# Patient Record
Sex: Male | Born: 1935 | Race: White | Hispanic: No | Smoking: Never smoker
Health system: Southern US, Community
[De-identification: ages and names within clinical notes are randomized; demographics above are authoritative.]

## PROBLEM LIST (undated history)

## (undated) DIAGNOSIS — C61 Malignant neoplasm of prostate: Secondary | ICD-10-CM

## (undated) DIAGNOSIS — Z860100 Personal history of colon polyps, unspecified: Secondary | ICD-10-CM

## (undated) DIAGNOSIS — N1330 Unspecified hydronephrosis: Secondary | ICD-10-CM

## (undated) DIAGNOSIS — C7951 Secondary malignant neoplasm of bone: Secondary | ICD-10-CM

## (undated) DIAGNOSIS — R55 Syncope and collapse: Secondary | ICD-10-CM

## (undated) DIAGNOSIS — Z192 Hormone resistant malignancy status: Secondary | ICD-10-CM

## (undated) DIAGNOSIS — K219 Gastro-esophageal reflux disease without esophagitis: Secondary | ICD-10-CM

## (undated) DIAGNOSIS — Z87898 Personal history of other specified conditions: Secondary | ICD-10-CM

## (undated) DIAGNOSIS — Z8719 Personal history of other diseases of the digestive system: Secondary | ICD-10-CM

## (undated) DIAGNOSIS — I440 Atrioventricular block, first degree: Secondary | ICD-10-CM

## (undated) DIAGNOSIS — K222 Esophageal obstruction: Secondary | ICD-10-CM

## (undated) DIAGNOSIS — Z923 Personal history of irradiation: Secondary | ICD-10-CM

## (undated) DIAGNOSIS — N529 Male erectile dysfunction, unspecified: Secondary | ICD-10-CM

## (undated) DIAGNOSIS — Z972 Presence of dental prosthetic device (complete) (partial): Secondary | ICD-10-CM

## (undated) DIAGNOSIS — K449 Diaphragmatic hernia without obstruction or gangrene: Secondary | ICD-10-CM

## (undated) DIAGNOSIS — R32 Unspecified urinary incontinence: Secondary | ICD-10-CM

## (undated) DIAGNOSIS — G4700 Insomnia, unspecified: Secondary | ICD-10-CM

## (undated) DIAGNOSIS — D649 Anemia, unspecified: Secondary | ICD-10-CM

## (undated) DIAGNOSIS — F419 Anxiety disorder, unspecified: Secondary | ICD-10-CM

## (undated) DIAGNOSIS — Z9079 Acquired absence of other genital organ(s): Secondary | ICD-10-CM

## (undated) DIAGNOSIS — I10 Essential (primary) hypertension: Secondary | ICD-10-CM

## (undated) HISTORY — DX: Essential (primary) hypertension: I10

## (undated) HISTORY — DX: Personal history of colonic polyps: Z86.010

## (undated) HISTORY — DX: Diaphragmatic hernia without obstruction or gangrene: K44.9

## (undated) HISTORY — PX: SUPRAPUBIC PROSTATECTOMY: SUR1056

## (undated) HISTORY — DX: Syncope and collapse: R55

## (undated) HISTORY — PX: APPENDECTOMY: SHX54

## (undated) HISTORY — DX: Male erectile dysfunction, unspecified: N52.9

## (undated) HISTORY — DX: Personal history of colon polyps, unspecified: Z86.0100

## (undated) HISTORY — DX: Malignant neoplasm of prostate: C61

## (undated) HISTORY — DX: Anxiety disorder, unspecified: F41.9

## (undated) HISTORY — PX: CATARACT EXTRACTION W/ INTRAOCULAR LENS  IMPLANT, BILATERAL: SHX1307

## (undated) HISTORY — DX: Insomnia, unspecified: G47.00

## (undated) HISTORY — PX: TRANSURETHRAL RESECTION OF PROSTATE: SHX73

## (undated) HISTORY — DX: Gastro-esophageal reflux disease without esophagitis: K21.9

---
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
## 1994-05-03 ENCOUNTER — Encounter (INDEPENDENT_AMBULATORY_CARE_PROVIDER_SITE_OTHER): Payer: Self-pay | Admitting: *Deleted

## 1997-09-11 ENCOUNTER — Ambulatory Visit (HOSPITAL_COMMUNITY): Admission: RE | Admit: 1997-09-11 | Discharge: 1997-09-11 | Payer: Self-pay | Admitting: Internal Medicine

## 1997-11-19 ENCOUNTER — Ambulatory Visit (HOSPITAL_COMMUNITY): Admission: RE | Admit: 1997-11-19 | Discharge: 1997-11-19 | Payer: Self-pay | Admitting: Internal Medicine

## 1997-12-09 ENCOUNTER — Ambulatory Visit (HOSPITAL_COMMUNITY): Admission: RE | Admit: 1997-12-09 | Discharge: 1997-12-09 | Payer: Self-pay | Admitting: Surgery

## 2002-04-21 ENCOUNTER — Encounter: Payer: Self-pay | Admitting: *Deleted

## 2002-04-21 ENCOUNTER — Inpatient Hospital Stay (HOSPITAL_COMMUNITY): Admission: EM | Admit: 2002-04-21 | Discharge: 2002-04-22 | Payer: Self-pay | Admitting: Emergency Medicine

## 2003-05-28 ENCOUNTER — Encounter: Payer: Self-pay | Admitting: Internal Medicine

## 2004-06-17 ENCOUNTER — Ambulatory Visit: Payer: Self-pay | Admitting: Internal Medicine

## 2004-09-06 ENCOUNTER — Ambulatory Visit: Payer: Self-pay | Admitting: Internal Medicine

## 2005-01-20 ENCOUNTER — Ambulatory Visit: Payer: Self-pay | Admitting: Internal Medicine

## 2005-04-21 ENCOUNTER — Ambulatory Visit: Payer: Self-pay | Admitting: Internal Medicine

## 2005-05-02 ENCOUNTER — Ambulatory Visit: Payer: Self-pay | Admitting: Internal Medicine

## 2005-07-04 ENCOUNTER — Ambulatory Visit: Payer: Self-pay | Admitting: Internal Medicine

## 2005-07-05 ENCOUNTER — Ambulatory Visit (HOSPITAL_COMMUNITY): Admission: RE | Admit: 2005-07-05 | Discharge: 2005-07-05 | Payer: Self-pay | Admitting: Surgery

## 2005-07-18 ENCOUNTER — Ambulatory Visit: Payer: Self-pay | Admitting: Internal Medicine

## 2006-01-11 ENCOUNTER — Ambulatory Visit: Payer: Self-pay | Admitting: Internal Medicine

## 2006-02-22 ENCOUNTER — Ambulatory Visit: Payer: Self-pay | Admitting: Internal Medicine

## 2006-04-04 DIAGNOSIS — Z8711 Personal history of peptic ulcer disease: Secondary | ICD-10-CM

## 2006-04-04 HISTORY — PX: PROSTATECTOMY: SHX69

## 2006-04-04 HISTORY — DX: Personal history of peptic ulcer disease: Z87.11

## 2006-07-25 ENCOUNTER — Ambulatory Visit: Payer: Self-pay | Admitting: Internal Medicine

## 2006-09-09 ENCOUNTER — Emergency Department (HOSPITAL_COMMUNITY): Admission: EM | Admit: 2006-09-09 | Discharge: 2006-09-09 | Payer: Self-pay | Admitting: Emergency Medicine

## 2006-09-18 ENCOUNTER — Encounter: Payer: Self-pay | Admitting: Urology

## 2006-09-19 ENCOUNTER — Inpatient Hospital Stay (HOSPITAL_COMMUNITY): Admission: RE | Admit: 2006-09-19 | Discharge: 2006-09-28 | Payer: Self-pay | Admitting: Urology

## 2006-09-19 ENCOUNTER — Ambulatory Visit: Payer: Self-pay | Admitting: Emergency Medicine

## 2006-09-27 ENCOUNTER — Ambulatory Visit: Payer: Self-pay | Admitting: Internal Medicine

## 2006-10-12 ENCOUNTER — Inpatient Hospital Stay (HOSPITAL_COMMUNITY): Admission: AD | Admit: 2006-10-12 | Discharge: 2006-10-16 | Payer: Self-pay | Admitting: Gastroenterology

## 2006-10-27 ENCOUNTER — Ambulatory Visit (HOSPITAL_COMMUNITY): Admission: RE | Admit: 2006-10-27 | Discharge: 2006-10-27 | Payer: Self-pay | Admitting: Surgery

## 2007-01-10 ENCOUNTER — Ambulatory Visit: Payer: Self-pay | Admitting: Internal Medicine

## 2007-01-10 LAB — CONVERTED CEMR LAB
ALT: 16 units/L (ref 0–53)
AST: 20 units/L (ref 0–37)
Albumin: 3.7 g/dL (ref 3.5–5.2)
Basophils Absolute: 0 10*3/uL (ref 0.0–0.1)
Basophils Relative: 0.4 % (ref 0.0–1.0)
Bilirubin, Direct: 0.1 mg/dL (ref 0.0–0.3)
CO2: 27 meq/L (ref 19–32)
Chloride: 110 meq/L (ref 96–112)
Eosinophils Absolute: 0.1 10*3/uL (ref 0.0–0.6)
GFR calc non Af Amer: 77 mL/min
HCT: 40.9 % (ref 39.0–52.0)
HDL: 47.7 mg/dL (ref >39.0)
MCHC: 32.8 g/dL (ref 30.0–36.0)
MCV: 80 fL (ref 78.0–100.0)
Neutro Abs: 3.8 10*3/uL (ref 1.4–7.7)
Neutrophils Relative %: 64.7 % (ref 43.0–77.0)
RBC: 5.11 M/uL (ref 4.22–5.81)
TSH: 1.62 microintl units/mL (ref 0.35–5.50)
Total Bilirubin: 0.7 mg/dL (ref 0.3–1.2)
Total CHOL/HDL Ratio: 3.5
Triglycerides: 89 mg/dL (ref 0–149)
VLDL: 18 mg/dL (ref 0–40)
WBC: 5.8 10*3/uL (ref 4.5–10.5)

## 2007-03-21 ENCOUNTER — Encounter: Payer: Self-pay | Admitting: Internal Medicine

## 2007-04-19 ENCOUNTER — Ambulatory Visit: Payer: Self-pay | Admitting: Internal Medicine

## 2007-04-19 DIAGNOSIS — I10 Essential (primary) hypertension: Secondary | ICD-10-CM | POA: Insufficient documentation

## 2007-04-19 DIAGNOSIS — Z860100 Personal history of colon polyps, unspecified: Secondary | ICD-10-CM | POA: Insufficient documentation

## 2007-04-19 DIAGNOSIS — F411 Generalized anxiety disorder: Secondary | ICD-10-CM | POA: Insufficient documentation

## 2007-04-19 DIAGNOSIS — K219 Gastro-esophageal reflux disease without esophagitis: Secondary | ICD-10-CM | POA: Insufficient documentation

## 2007-04-19 DIAGNOSIS — E559 Vitamin D deficiency, unspecified: Secondary | ICD-10-CM | POA: Insufficient documentation

## 2007-04-19 DIAGNOSIS — S139XXA Sprain of joints and ligaments of unspecified parts of neck, initial encounter: Secondary | ICD-10-CM | POA: Insufficient documentation

## 2007-04-19 DIAGNOSIS — K279 Peptic ulcer, site unspecified, unspecified as acute or chronic, without hemorrhage or perforation: Secondary | ICD-10-CM | POA: Insufficient documentation

## 2007-04-26 ENCOUNTER — Telehealth: Payer: Self-pay | Admitting: Internal Medicine

## 2007-08-17 ENCOUNTER — Ambulatory Visit: Payer: Self-pay | Admitting: Internal Medicine

## 2007-08-17 ENCOUNTER — Telehealth: Payer: Self-pay | Admitting: Internal Medicine

## 2007-08-17 DIAGNOSIS — G4700 Insomnia, unspecified: Secondary | ICD-10-CM

## 2007-08-28 ENCOUNTER — Encounter (INDEPENDENT_AMBULATORY_CARE_PROVIDER_SITE_OTHER): Payer: Self-pay | Admitting: *Deleted

## 2007-09-19 ENCOUNTER — Telehealth (INDEPENDENT_AMBULATORY_CARE_PROVIDER_SITE_OTHER): Payer: Self-pay | Admitting: *Deleted

## 2007-10-12 ENCOUNTER — Ambulatory Visit: Payer: Self-pay | Admitting: Internal Medicine

## 2007-11-06 ENCOUNTER — Ambulatory Visit: Payer: Self-pay | Admitting: Gastroenterology

## 2007-11-06 DIAGNOSIS — Z85038 Personal history of other malignant neoplasm of large intestine: Secondary | ICD-10-CM | POA: Insufficient documentation

## 2007-11-07 ENCOUNTER — Telehealth: Payer: Self-pay | Admitting: Gastroenterology

## 2007-12-19 ENCOUNTER — Ambulatory Visit: Payer: Self-pay | Admitting: Gastroenterology

## 2007-12-19 ENCOUNTER — Encounter: Payer: Self-pay | Admitting: Gastroenterology

## 2007-12-21 ENCOUNTER — Encounter: Payer: Self-pay | Admitting: Gastroenterology

## 2008-01-21 ENCOUNTER — Ambulatory Visit: Payer: Self-pay | Admitting: Internal Medicine

## 2008-02-11 ENCOUNTER — Ambulatory Visit: Payer: Self-pay | Admitting: Internal Medicine

## 2008-02-11 LAB — CONVERTED CEMR LAB
Calcium: 9 mg/dL (ref 8.4–10.5)
Chloride: 108 meq/L (ref 96–112)
Creatinine, Ser: 1 mg/dL (ref 0.4–1.5)
GFR calc Af Amer: 93 mL/min
GFR calc non Af Amer: 77 mL/min
Glucose, Bld: 108 mg/dL — ABNORMAL HIGH (ref 70–99)
Potassium: 4.4 meq/L (ref 3.5–5.1)
Sodium: 141 meq/L (ref 135–145)

## 2008-02-18 ENCOUNTER — Ambulatory Visit: Payer: Self-pay | Admitting: Internal Medicine

## 2008-03-11 ENCOUNTER — Ambulatory Visit: Payer: Self-pay | Admitting: Internal Medicine

## 2008-04-14 ENCOUNTER — Telehealth: Payer: Self-pay | Admitting: Internal Medicine

## 2008-04-22 ENCOUNTER — Encounter: Payer: Self-pay | Admitting: Internal Medicine

## 2008-08-18 ENCOUNTER — Ambulatory Visit: Payer: Self-pay | Admitting: Internal Medicine

## 2008-08-18 LAB — CONVERTED CEMR LAB
Calcium: 8.9 mg/dL (ref 8.4–10.5)
Chloride: 107 meq/L (ref 96–112)
Creatinine, Ser: 1 mg/dL (ref 0.4–1.5)
GFR calc non Af Amer: 77.06 mL/min (ref >60)
Potassium: 4.3 meq/L (ref 3.5–5.1)

## 2008-08-22 ENCOUNTER — Ambulatory Visit: Payer: Self-pay | Admitting: Internal Medicine

## 2008-08-22 DIAGNOSIS — R634 Abnormal weight loss: Secondary | ICD-10-CM | POA: Insufficient documentation

## 2008-08-22 DIAGNOSIS — N529 Male erectile dysfunction, unspecified: Secondary | ICD-10-CM | POA: Insufficient documentation

## 2008-08-28 ENCOUNTER — Encounter: Payer: Self-pay | Admitting: Internal Medicine

## 2008-10-02 ENCOUNTER — Ambulatory Visit: Payer: Self-pay | Admitting: Internal Medicine

## 2008-10-02 LAB — CONVERTED CEMR LAB
Basophils Relative: 0.3 % (ref 0.0–3.0)
Eosinophils Absolute: 0 10*3/uL (ref 0.0–0.7)
Eosinophils Relative: 0.7 % (ref 0.0–5.0)
Lymphocytes Relative: 14 % (ref 12.0–46.0)
Lymphs Abs: 0.9 10*3/uL (ref 0.7–4.0)
Monocytes Relative: 9.9 % (ref 3.0–12.0)
Neutrophils Relative %: 75.1 % (ref 43.0–77.0)
Platelets: 169 10*3/uL (ref 150.0–400.0)
RBC: 4.69 M/uL (ref 4.22–5.81)
WBC: 6.2 10*3/uL (ref 4.5–10.5)

## 2008-10-07 ENCOUNTER — Ambulatory Visit: Payer: Self-pay | Admitting: Internal Medicine

## 2008-10-07 DIAGNOSIS — R197 Diarrhea, unspecified: Secondary | ICD-10-CM | POA: Insufficient documentation

## 2009-01-22 ENCOUNTER — Ambulatory Visit (HOSPITAL_COMMUNITY): Admission: RE | Admit: 2009-01-22 | Discharge: 2009-01-22 | Payer: Self-pay | Admitting: Urology

## 2009-01-26 ENCOUNTER — Ambulatory Visit: Payer: Self-pay | Admitting: Internal Medicine

## 2009-02-02 ENCOUNTER — Encounter: Payer: Self-pay | Admitting: Internal Medicine

## 2009-02-04 ENCOUNTER — Ambulatory Visit: Admission: RE | Admit: 2009-02-04 | Discharge: 2009-04-01 | Payer: Self-pay | Admitting: Radiation Oncology

## 2009-02-04 ENCOUNTER — Ambulatory Visit: Payer: Self-pay | Admitting: Internal Medicine

## 2009-02-05 ENCOUNTER — Encounter: Payer: Self-pay | Admitting: Internal Medicine

## 2009-02-05 LAB — CONVERTED CEMR LAB
BUN: 9 mg/dL (ref 6–23)
Basophils Absolute: 0 10*3/uL (ref 0.0–0.1)
Basophils Relative: 0.3 % (ref 0.0–3.0)
Calcium: 9.1 mg/dL (ref 8.4–10.5)
Creatinine, Ser: 1 mg/dL (ref 0.4–1.5)
Lymphocytes Relative: 16.6 % (ref 12.0–46.0)
MCV: 98.5 fL (ref 78.0–100.0)
Monocytes Absolute: 0.6 10*3/uL (ref 0.1–1.0)
Monocytes Relative: 9 % (ref 3.0–12.0)
RBC: 4.72 M/uL (ref 4.22–5.81)
TSH: 1.11 microintl units/mL (ref 0.35–5.50)
WBC: 6.5 10*3/uL (ref 4.5–10.5)

## 2009-02-09 ENCOUNTER — Ambulatory Visit: Payer: Self-pay | Admitting: Internal Medicine

## 2009-04-08 ENCOUNTER — Ambulatory Visit: Admission: RE | Admit: 2009-04-08 | Discharge: 2009-06-24 | Payer: Self-pay | Admitting: Radiation Oncology

## 2009-04-09 ENCOUNTER — Encounter: Payer: Self-pay | Admitting: Internal Medicine

## 2009-04-19 ENCOUNTER — Emergency Department (HOSPITAL_COMMUNITY): Admission: EM | Admit: 2009-04-19 | Discharge: 2009-04-19 | Payer: Self-pay | Admitting: Emergency Medicine

## 2009-04-24 ENCOUNTER — Ambulatory Visit: Payer: Self-pay | Admitting: Internal Medicine

## 2009-04-28 ENCOUNTER — Telehealth (INDEPENDENT_AMBULATORY_CARE_PROVIDER_SITE_OTHER): Payer: Self-pay | Admitting: *Deleted

## 2009-06-25 ENCOUNTER — Encounter: Payer: Self-pay | Admitting: Internal Medicine

## 2009-07-22 ENCOUNTER — Ambulatory Visit: Payer: Self-pay | Admitting: Internal Medicine

## 2009-07-30 ENCOUNTER — Encounter: Payer: Self-pay | Admitting: Internal Medicine

## 2009-11-19 ENCOUNTER — Ambulatory Visit: Payer: Self-pay | Admitting: Internal Medicine

## 2009-11-19 LAB — CONVERTED CEMR LAB
ALT: 18 units/L (ref 0–53)
AST: 24 units/L (ref 0–37)
CO2: 26 meq/L (ref 19–32)
Calcium: 8.8 mg/dL (ref 8.4–10.5)
Chloride: 109 meq/L (ref 96–112)
Creatinine, Ser: 0.8 mg/dL (ref 0.4–1.5)
GFR calc non Af Amer: 102.31 mL/min (ref >60)
Glucose, Bld: 100 mg/dL — ABNORMAL HIGH (ref 70–99)
Nitrite: NEGATIVE
Specific Gravity, Urine: 1.025 (ref 1.000–1.030)
Total Protein, Urine: NEGATIVE mg/dL
Total Protein: 6 g/dL (ref 6.0–8.3)
Urine Glucose: NEGATIVE mg/dL
Urobilinogen, UA: 0.2 (ref 0.0–1.0)
pH: 5 (ref 5.0–8.0)

## 2009-11-23 ENCOUNTER — Ambulatory Visit: Payer: Self-pay | Admitting: Internal Medicine

## 2010-01-07 ENCOUNTER — Ambulatory Visit: Payer: Self-pay | Admitting: Internal Medicine

## 2010-01-11 ENCOUNTER — Ambulatory Visit: Payer: Self-pay | Admitting: Internal Medicine

## 2010-01-18 ENCOUNTER — Ambulatory Visit: Payer: Self-pay | Admitting: Internal Medicine

## 2010-01-18 ENCOUNTER — Encounter: Payer: Self-pay | Admitting: Internal Medicine

## 2010-01-18 LAB — CONVERTED CEMR LAB
ALT: 19 units/L (ref 0–53)
Alkaline Phosphatase: 69 units/L (ref 39–117)
BUN: 18 mg/dL (ref 6–23)
Basophils Absolute: 0.1 10*3/uL (ref 0.0–0.1)
CRP, High Sensitivity: 0.96 (ref 0.00–5.00)
Calcium: 10 mg/dL (ref 8.4–10.5)
Eosinophils Absolute: 0.1 10*3/uL (ref 0.0–0.7)
Folate: 14.4 ng/mL
Glucose, Bld: 107 mg/dL — ABNORMAL HIGH (ref 70–99)
HCT: 47.1 % (ref 39.0–52.0)
Hemoglobin: 16.1 g/dL (ref 13.0–17.0)
MCHC: 34.1 g/dL (ref 30.0–36.0)
Neutrophils Relative %: 78.2 % — ABNORMAL HIGH (ref 43.0–77.0)
Potassium: 5 meq/L (ref 3.5–5.1)
RBC: 4.93 M/uL (ref 4.22–5.81)
Sodium: 139 meq/L (ref 135–145)
Tissue Transglutaminase Ab, IgA: 11.6 units (ref <20)
Total Protein: 7.6 g/dL (ref 6.0–8.3)
Vit D, 25-Hydroxy: 34 ng/mL (ref 30–89)
Vitamin B-12: 387 pg/mL (ref 211–911)
WBC: 7.3 10*3/uL (ref 4.5–10.5)

## 2010-01-19 ENCOUNTER — Encounter: Payer: Self-pay | Admitting: Internal Medicine

## 2010-03-05 ENCOUNTER — Encounter: Payer: Self-pay | Admitting: Internal Medicine

## 2010-03-10 ENCOUNTER — Encounter: Payer: Self-pay | Admitting: Internal Medicine

## 2010-03-10 ENCOUNTER — Ambulatory Visit: Payer: Self-pay | Admitting: Internal Medicine

## 2010-03-10 DIAGNOSIS — H6120 Impacted cerumen, unspecified ear: Secondary | ICD-10-CM | POA: Insufficient documentation

## 2010-04-28 ENCOUNTER — Ambulatory Visit
Admission: RE | Admit: 2010-04-28 | Discharge: 2010-04-28 | Payer: Self-pay | Source: Home / Self Care | Attending: Internal Medicine | Admitting: Internal Medicine

## 2010-04-28 DIAGNOSIS — M25559 Pain in unspecified hip: Secondary | ICD-10-CM | POA: Insufficient documentation

## 2010-05-04 NOTE — Assessment & Plan Note (Signed)
Summary: 4 MO ROV /NWS   Vital Signs:  Patient profile:   77 year old male Height:      73.25 inches Weight:      204 pounds BMI:     26.83 Temp:     97.8 degrees F oral Pulse rate:   80 / minute Pulse rhythm:   regular Resp:     16 per minute BP sitting:   118 / 80  (left arm) Cuff size:   regular  Vitals Entered By: Lanier Prude, CMA(AAMA) (November 23, 2009 1:14 PM) CC: 4 mo f/u Is Patient Diabetic? No Comments pt is not taking Align.  He states Questran made his symptoms worse.   Primary Care Provider:  AlekseiPlotnikov, M.D.  CC:  4 mo f/u.  History of Present Illness: C/o abd. gas, diarrhea F/u HTN, HH  Current Medications (verified): 1)  Terazosin Hcl 2 Mg  Caps (Terazosin Hcl) .[name].. 1 By Mouth Qd 2)  Align  Caps (Misc Intestinal Flora Regulat) .[name].. 1 By Mouth Once Daily X 2 Wks After Flagyl 3)  Vitamin D 1000 Unit Tabs (Cholecalciferol) .[name].. 1 By Mouth Qd 4)  Align  Caps (Probiotic Product) .[name].. 1 By Mouth Once Daily For Your Intesinal Flora Restoraion 5)  Equate Anti-Diarrhea .... As Directed 6)  Pepto-Bismol .... As Directed 7)  Gas-X .[name].. As Directed 8)  Questran 4 Gm Pack (Cholestyramine) .[name].. 1 By Mouth Two Times A Day As Needed Diarrhea 9)  Loratadine 10 Mg Tabs (Loratadine) .[name].. 1 By Mouth Once Daily As Needed Allergies  Allergies (verified): 1)  ! Aspirin (Aspirin) 2)  ! Iodine (Iodine) 3)  ! Zithromax (Azithromycin) 4)  ! Ceftin (Cefuroxime Axetil)  Past History:  Family History: Last updated: 02/18/2008 Family History Hypertension M 102 No FH of Colon Cancer: father had pancreatic cancer  Social History: Retired Single, getting married in Nov 2011 Former Smoker  Review of Systems  The patient denies fever, prolonged cough, and abdominal pain.         gas and diarrhea  Physical Exam  General:  Well-developed,well-nourished,in no acute distress; alert,appropriate and cooperative throughout examination Ears:  External ear exam shows  no significant lesions or deformities.  Otoscopic examination reveals clear canals, tympanic membranes are intact bilaterally without bulging, retraction, inflammation or discharge. Hearing is grossly normal bilaterally. Mouth:  Oral mucosa and oropharynx without lesions or exudates.  Teeth in good repair. Lungs:  Normal respiratory effort, chest expands symmetrically. Lungs are clear to auscultation, no crackles or wheezes. Heart:  Normal rate and regular rhythm. S1 and S2 normal without gallop, murmur, click, rub or other extra sounds. Abdomen:  Bowel sounds positive,abdomen soft and non-tender without masses, organomegaly or hernias noted. Msk:  No deformity or scoliosis noted of thoracic or lumbar spine.   Neurologic:  No cranial nerve deficits noted. Station and gait are normal. Plantar reflexes are down-going bilaterally. DTRs are symmetrical throughout. Sensory, motor and coordinative functions appear intact. Skin:  Intact without suspicious lesions or rashes Psych:  Cognition and judgment appear intact. Alert and cooperative with normal attention span and concentration. No apparent delusions, illusions, hallucinations   Impression & Recommendations:  Problem # 1:  DIARRHEA (ICD-787.91) Assessment Unchanged Pepto helpsMetronidazole and Align to try again See "Patient Instructions".   Problem # 2:  WEIGHT LOSS, ABNORMAL (ICD-783.21) Assessment: Improved  Problem # 3:  HYPERTENSION (ICD-401.9) Assessment: Unchanged  His updated medication list for this problem includes:    Terazosin Hcl 2 Mg Caps (Terazosin  hcl) ..... 1 by mouth qd  Problem # 4:  PROSTATE CANCER, HX OF (ICD-V10.46) Assessment: Unchanged On the regimen of medicine(s) reflected in the chart    Complete Medication List: 1)  Terazosin Hcl 2 Mg Caps (Terazosin hcl) .[name].. 1 by mouth qd 2)  Vitamin D 1000 Unit Tabs (Cholecalciferol) .[name].. 1 by mouth qd 3)  Loratadine 10 Mg Tabs (Loratadine) .[name].. 1 by mouth once daily as  needed allergies 4)  Equate Anti-diarrhea  .... As directed 5)  Pepto-bismol  .... As directed 6)  Gas-x  .... As directed 7)  Metronidazole 250 Mg Tabs (Metronidazole) .[name].. 1 by mouth qid 8)  Align Caps (Probiotic product) .[name].. 1 by mouth once daily for your intesinal flora restoraion after you finish metronidazole 9)  Prilosec 20 Mg Cpdr (Omeprazole) .[name].. 1 poq d  Patient Instructions: 1)  Please schedule a follow-up appointment in 3 months well w/labs. Prescriptions: TERAZOSIN HCL 2 MG  CAPS (TERAZOSIN HCL) 1 by mouth qd  #90 x 3   Entered and Authorized by:   Tresa Garter MD   Signed by:   Tresa Garter MD on 11/23/2009   Method used:   Electronically to        [facility] [address] (retail)       [phone]       [place], [place]  [address]       Ph: [phone]       Fax: [phone]   RxID:   [id number] ALIGN  CAPS (PROBIOTIC PRODUCT) 1 by mouth once daily for your intesinal flora restoraion after you finish Metronidazole  #30 x 1   Entered and Authorized by:   Tresa Garter MD   Signed by:   Tresa Garter MD on 11/23/2009   Method used:   Electronically to        [facility] [address] (retail)       [phone]       [place], [place]  [address]       Ph: [phone]       Fax: [phone]   RxID:   [id number] METRONIDAZOLE 250 MG TABS (METRONIDAZOLE) 1 by mouth qid  #40 x 1   Entered and Authorized by:   Tresa Garter MD   Signed by:   Tresa Garter MD on 11/23/2009   Method used:   Electronically to        [facility] [address] (retail)       [phone]       [place], [place]  [address]       Ph: [phone]       Fax: [phone]   RxID:   [id number]

## 2010-05-04 NOTE — Progress Notes (Signed)
Summary: Record request   Request for records received from [facility] Request forwarded to [employer]. Jorge [name]  April 28, 2009 10:40 AM

## 2010-05-04 NOTE — Letter (Signed)
[facility]   Imported By: Sherian Rein 08/18/2009 13:05:00  _____________________________________________________________________  External Attachment:    Type:   Image     Comment:   External Document

## 2010-05-04 NOTE — Letter (Signed)
[facility]   Imported By: Lester [place] 08/20/2009 12:49:42  _____________________________________________________________________  External Attachment:    Type:   Image     Comment:   External Document

## 2010-05-04 NOTE — Letter (Signed)
[facility]   Imported By: Sherian Rein 04/30/2009 13:17:21  _____________________________________________________________________  External Attachment:    Type:   Image     Comment:   External Document

## 2010-05-04 NOTE — Assessment & Plan Note (Signed)
Summary: flu shot/plot/cd  Nurse Visit   Allergies: 1)  ! Aspirin (Aspirin) 2)  ! Iodine (Iodine) 3)  ! Zithromax (Azithromycin) 4)  ! Ceftin (Cefuroxime Axetil)  Orders Added: 1)  Flu Vaccine 66yrs + MEDICARE PATIENTS [Q2039] 2)  Administration Flu vaccine - MCR [G0008]  Flu Vaccine Consent Questions     Do you have a history of severe allergic reactions to this vaccine? no    Any prior history of allergic reactions to egg and/or gelatin? no    Do you have a sensitivity to the preservative Thimersol? no    Do you have a past history of Guillan-Barre Syndrome? no    Do you currently have an acute febrile illness? no    Have you ever had a severe reaction to latex? no    Vaccine information given and explained to patient? yes    Are you currently pregnant? no    Lot Number:[id number]   Exp Date:10/02/2010   Site Given  Left Deltoid IM

## 2010-05-04 NOTE — Assessment & Plan Note (Signed)
Summary: 3 mos well / medicare / will come fasting / cd   Vital Signs:  Patient profile:   77 year old male Height:      73.25 inches Weight:      200 pounds BMI:     26.30 Temp:     98.6 degrees F oral Pulse rate:   80 / minute Pulse rhythm:   regular Resp:     16 per minute BP sitting:   122 / 68  (left arm) Cuff size:   regular  Vitals Entered By: Lanier Prude, Beverly Gust) (March 10, 2010 9:28 AM) CC: MWV  Is Patient Diabetic? No Comments pt is not taking Loratadine or Librax   Primary Care Provider:  Tammy Sours, M.D.  CC:  MWV .  History of Present Illness: The patient presents for a preventive health examination  Patient past medical history, social history, and family history reviewed in detail no significant changes.  Patient is physically active. Depression is negative and mood is good. Hearing is normal, and able to perform activities of daily living. Risk of falling is negligible and home safety has been reviewed and is appropriate. Patient has normal height, some overweight, and visual acuity is good. Patient has been counseled on age-appropriate routine health concerns for screening and prevention. Normal cognition. Education, counseling done.  The patient presents for a follow up of hypertension, diarrhea  Preventive Screening-Counseling & Management  Alcohol-Tobacco     Alcohol drinks/day: <1     Alcohol Counseling: not indicated; patient does not drink     Smoking Status: quit > 6 months     Passive Smoke Exposure: no  Caffeine-Diet-Exercise     Caffeine Counseling: not indicated; caffeine use is not excessive or problematic     Diet Counseling: not indicated; diet is assessed to be healthy     Nutrition Referrals: no     Does Patient Exercise: yes     Depression Counseling: not indicated; screening negative for depression  Hep-HIV-STD-Contraception     Hepatitis Risk: no risk noted     Sun Exposure-Excessive: no  Safety-Violence-Falls  Seat Belt Use: yes     Fall Risk Counseling: not indicated; no significant falls noted      Sexual History:  currently monogamous.    Current Medications (verified): 1)  Terazosin Hcl 2 Mg  Caps (Terazosin Hcl) .[name].. 1 By Mouth Qd 2)  Vitamin D 1000 Unit Tabs (Cholecalciferol) .[name].. 1 By Mouth Qd 3)  Loratadine 10 Mg Tabs (Loratadine) .[name].. 1 By Mouth Once Daily As Needed Allergies 4)  Prilosec 20 Mg Cpdr (Omeprazole) .[name].. 1 Poq D 5)  Librax 2.5-5 Mg Caps (Clidinium-Chlordiazepoxide) .[name].. 1 By Mouth Qid As Needed Spasms and Anxiety 6)  Megestrol Acetate 20 Mg Tabs (Megestrol Acetate) .[name].. 1 By Mouth Once Daily 7)  Flagyl 250 Mg Tabs (Metronidazole) .[name].. 1 By Mouth Once Daily 8)  Stool Softener 100 Mg Caps (Docusate Sodium) .... 2 By Mouth Once Daily  Allergies (verified): 1)  ! Aspirin (Aspirin) 2)  ! Iodine (Iodine) 3)  ! Zithromax (Azithromycin) 4)  ! Ceftin (Cefuroxime Axetil)  Past History:  Past Medical History: Last updated: 04/24/2009 Anxiety GERD Hypertension Gastric ulcer disease, june 2008 in hosp for prostate surgery (may have been cameron's type ulcer from twisting HH) Dr Christella Hartigan ED Colonic polyps, hx of Prostate CA 2010 Dr Lenn Sink Vit D def Insomnia hiatal hernia Prostate cancer 2010 seeds and XRT Syncope 2011 from Bicalutamide  Past Surgical History: Last updated: 08/17/2007  Transurethral resection of prostate  Family History: Last updated: 02/18/2008 Family History Hypertension M 102 No FH of Colon Cancer: father had pancreatic cancer  Social History: Retired Former Smoker Remarried in Nov 2011 wife Bonita Quin Alcohol use-yes Regular exercise-yes golf Smoking Status:  quit > 6 months Passive Smoke Exposure:  no Does Patient Exercise:  yes Sun Exposure-Excessive:  no Seat Belt Use:  yes  Review of Systems  The patient denies anorexia, fever, weight loss, weight gain, vision loss, decreased hearing, hoarseness, chest pain, syncope, dyspnea on  exertion, peripheral edema, prolonged cough, headaches, hemoptysis, abdominal pain, melena, hematochezia, severe indigestion/heartburn, hematuria, incontinence, genital sores, muscle weakness, suspicious skin lesions, transient blindness, difficulty walking, depression, unusual weight change, abnormal bleeding, enlarged lymph nodes, angioedema, and testicular masses.         No diarrhea x 2 months   Physical Exam  General:  alert, well-developed, well-nourished, well-hydrated, appropriate dress, normal appearance, healthy-appearing, cooperative to examination, and good hygiene.   Head:  normocephalic, atraumatic, no abnormalities observed, and no abnormalities palpated.   Eyes:  no icterus Ears:  wax B Nose:  External nasal examination shows no deformity or inflammation. Nasal mucosa are pink and moist without lesions or exudates. Mouth:  Oral mucosa and oropharynx without lesions or exudates.  Teeth in good repair. Neck:  supple, full ROM, no masses, no thyromegaly, no thyroid nodules or tenderness, no JVD, normal carotid upstroke, no carotid bruits, no cervical lymphadenopathy, and no neck tenderness.   Lungs:  normal respiratory effort, no intercostal retractions, no accessory muscle use, normal breath sounds, no dullness, no fremitus, no crackles, and no wheezes.   Heart:  normal rate, regular rhythm, no murmur, no gallop, no rub, and no JVD.   Abdomen:  soft, non-tender, normal bowel sounds, no distention, no masses, no guarding, no rigidity, no rebound tenderness, no abdominal hernia, no inguinal hernia, no hepatomegaly, and no splenomegaly.   Msk:  No deformity or scoliosis noted of thoracic or lumbar spine.   Extremities:  No clubbing, cyanosis, edema, or deformity noted with normal full range of motion of all joints.   Neurologic:  No cranial nerve deficits noted. Station and gait are normal. Plantar reflexes are down-going bilaterally. DTRs are symmetrical throughout. Sensory, motor and  coordinative functions appear intact. Skin:  Intact without suspicious lesions or rashes Psych:  Cognition and judgment appear intact. Alert and cooperative with normal attention span and concentration. No apparent delusions, illusions, hallucinations   Impression & Recommendations:  Problem # 1:  ROUTINE GENERAL MEDICAL EXAM@HEALTH  CARE FACL (ICD-V70.0) Assessment New Overall doing well, age appropriate education and counseling updated and referral for appropriate preventive services done unless declined, immunizations up to date or declined, diet counseling done if overweight, urged to quit smoking if smokes, most recent labs reviewed and current ordered if appropriate, ecg reviewed or declined (interpretation per ECG scanned in the EMR if done); information regarding Medicare Preventation requirements given if appropriate.  I have personally reviewed the Medicare Annual Wellness questionnaire and have noted 1.   The patient's medical and social history 2.   Their use of alcohol, tobacco or illicit drugs 3.   Their current medications and supplements 4.   The patient's functional ability including ADL's, fall risks, home safety risks and hearing or visual             impairment. 5.   Diet and physical activities 6.   Evidence for depression or mood disorders  The patients weight, height, BMI and  visual acuity have been recorded in the chart I have made referrals, counseling and provided education to the patient based review of the above and I have provided the pt with a written personalized care plan for preventive service Bedside carotid US was nl   Orders: EKG w/ Interpretation (93000)ok Medicare -1st Annual Wellness Visit [phone]) ok - no acute changes Zostavax - we are checking on $$  Problem # 2:  DIARRHEA (ICD-787.91) Assessment: Improved On Flagyl once daily and a stool softner  Problem # 3:  PROSTATE CANCER, HX OF (ICD-V10.46) Assessment: Unchanged  Problem # 4:   HYPERTENSION (ICD-401.9) Assessment: Unchanged  His updated medication list for this problem includes:    Terazosin Hcl 2 Mg Caps (Terazosin hcl) .[name]... 1 by mouth qd  BP today: 122/68 Prior BP: 122/72 (01/18/2010)  Labs Reviewed: K+: 5.0 (01/18/2010) Creat: : 1.0 (01/18/2010)   Chol: 167 (01/10/2007)   HDL: 47.7 (01/10/2007)   LDL: 102 (01/10/2007)   TG: 89 (01/10/2007)  Problem # 5:  CERUMEN IMPACTION (ICD-380.4) Assessment: New Procedure: ear irrigation Reason: wax impaction Risks/benefis were discussed. Both ears were irrigated with warm water. Large ammount of wax was recovered. Instrumentation with metal ear loop was performed to accomplish the removal. Tolerated well Complications: none   Orders: Cerumen Impaction Removal (75643)  Complete Medication List: 1)  Terazosin Hcl 2 Mg Caps (Terazosin hcl) .[name].. 1 by mouth qd 2)  Vitamin D 1000 Unit Tabs (Cholecalciferol) .[name].. 1 by mouth qd 3)  Loratadine 10 Mg Tabs (Loratadine) .[name].. 1 by mouth once daily as needed allergies 4)  Prilosec 20 Mg Cpdr (Omeprazole) .[name].. 1 poq d 5)  Librax 2.5-5 Mg Caps (Clidinium-chlordiazepoxide) .[name].. 1 by mouth qid as needed spasms and anxiety 6)  Megestrol Acetate 20 Mg Tabs (Megestrol acetate) .[name].. 1 by mouth once daily 7)  Flagyl 250 Mg Tabs (Metronidazole) .[name].. 1 by mouth once daily 8)  Stool Softener 100 Mg Caps (Docusate sodium) .... 2 by mouth once daily 9)  Cortisporin 3.5-10000-1 Soln (Neomycin-polymyxin-hc) .... 3 gtt in r ear tid  Patient Instructions: 1)  Please schedule a follow-up appointment in 4 months. Prescriptions: CORTISPORIN 3.5-10000-1 SOLN (NEOMYCIN-POLYMYXIN-HC) 3 gtt in R ear tid  #1 x 1   Entered and Authorized by:   Tresa Garter MD   Signed by:   Tresa Garter MD on 03/10/2010   Method used:   Print then Give to Patient   RxID:   [id number] TERAZOSIN HCL 2 MG  CAPS (TERAZOSIN HCL) 1 by mouth qd  #90 x 3   Entered and Authorized by:   Tresa Garter MD   Signed by:   Tresa Garter MD on 03/10/2010   Method used:   Electronically to        [facility] [address] (retail)       [phone]       [place], [place]  [address]       Ph: [phone]       Fax: [phone]   RxID:   [id number] FLAGYL 250 MG TABS (METRONIDAZOLE) 1 by mouth once daily  #90 x 1   Entered and Authorized by:   Tresa Garter MD   Signed by:   Tresa Garter MD on 03/10/2010   Method used:   Electronically to        [facility] [address] (retail)       [phone]       [place], [place]  [address]       Ph: [phone]       Fax: [phone]   RxID:   [id number]    Orders Added: 1)  EKG w/ Interpretation [93000] 2)  Medicare -1st Annual Wellness Visit [G0438] 3)  Cerumen Impaction Removal [69210] 4)  Est. Patient Level IV [84132]   Immunization History:  Tetanus/Td Immunization History:    Tetanus/Td:  historical (12/17/2003)   Immunization History:  Tetanus/Td Immunization History:    Tetanus/Td:  Historical (12/17/2003)

## 2010-05-04 NOTE — Assessment & Plan Note (Signed)
Summary: GI concerns?/SD   Vital Signs:  Patient profile:   77 year old male Height:      73.25 inches Weight:      202 pounds BMI:     26.56 Temp:     97.1 degrees F oral Pulse rate:   88 / minute Pulse rhythm:   regular Resp:     16 per minute BP sitting:   140 / 84  (left arm) Cuff size:   regular  Vitals Entered By: Lanier Prude, CMA(AAMA) (January 11, 2010 9:22 AM) CC: recurrent diarrhea and bloating Is Patient Diabetic? No   Primary Care Humzah Harty:  AlekseiPlotnikov, M.D.  CC:  recurrent diarrhea and bloating.  History of Present Illness: C/o diarrhea reoccured after he got upset  Preventive Screening-Counseling & Management  Alcohol-Tobacco     Smoking Status: quit  Current Medications (verified): 1)  Terazosin Hcl 2 Mg  Caps (Terazosin Hcl) .[name].. 1 By Mouth Qd 2)  Vitamin D 1000 Unit Tabs (Cholecalciferol) .[name].. 1 By Mouth Qd 3)  Loratadine 10 Mg Tabs (Loratadine) .[name].. 1 By Mouth Once Daily As Needed Allergies 4)  Equate Anti-Diarrhea .... As Directed 5)  Pepto-Bismol .... As Directed 6)  Gas-X .[name].. As Directed 7)  Align  Caps (Probiotic Product) .[name].. 1 By Mouth Once Daily For Your Intesinal Flora Restoraion After You Finish Metronidazole 8)  Prilosec 20 Mg Cpdr (Omeprazole) .[name].. 1 Poq D  Allergies (verified): 1)  ! Aspirin (Aspirin) 2)  ! Iodine (Iodine) 3)  ! Zithromax (Azithromycin) 4)  ! Ceftin (Cefuroxime Axetil)  Physical Exam  General:  Well-developed,well-nourished,in no acute distress; alert,appropriate and cooperative throughout examination Nose:  External nasal examination shows no deformity or inflammation. Nasal mucosa are pink and moist without lesions or exudates. Mouth:  Oral mucosa and oropharynx without lesions or exudates.  Teeth in good repair. Lungs:  Normal respiratory effort, chest expands symmetrically. Lungs are clear to auscultation, no crackles or wheezes. Heart:  Normal rate and regular rhythm. S1 and S2 normal without gallop,  murmur, click, rub or other extra sounds. Abdomen:  Bowel sounds positive,abdomen soft and non-tender without masses, organomegaly or hernias noted. Msk:  No deformity or scoliosis noted of thoracic or lumbar spine.   Extremities:  No clubbing, cyanosis, edema, or deformity noted with normal full range of motion of all joints.   Neurologic:  No cranial nerve deficits noted. Station and gait are normal. Plantar reflexes are down-going bilaterally. DTRs are symmetrical throughout. Sensory, motor and coordinative functions appear intact. Skin:  Intact without suspicious lesions or rashes Psych:  Cognition and judgment appear intact. Alert and cooperative with normal attention span and concentration. No apparent delusions, illusions, hallucinations   Impression & Recommendations:  Problem # 1:  DIARRHEA (ICD-787.91) reoccured Assessment Comment Only  His updated medication list for this problem includes:    Align Caps (Probiotic product) .[name]... 1 by mouth once daily for your intesinal flora restoraion after you finish metronidazole    Lomotil 2.5-0.025 Mg Tabs (Diphenoxylate-atropine) .[name]... 1-2 by mouth qid as needed diarrhea Librax as needed Metronidazole - take if diarrhea lasts > 1 wk GI cons. Dr Christella Hartigan if diarrhea comes back  Problem # 2:  ANXIETY (ICD-300.00) Assessment: Comment Only as above  Problem # 3:  HYPERTENSION (ICD-401.9) Assessment: Unchanged  His updated medication list for this problem includes:    Terazosin Hcl 2 Mg Caps (Terazosin hcl) .[name]... 1 by mouth qd  Problem # 4:  PROSTATE CANCER, HX OF (ICD-V10.46) Assessment:  Unchanged  Complete Medication List: 1)  Terazosin Hcl 2 Mg Caps (Terazosin hcl) .[name].. 1 by mouth qd 2)  Vitamin D 1000 Unit Tabs (Cholecalciferol) .[name].. 1 by mouth qd 3)  Loratadine 10 Mg Tabs (Loratadine) .[name].. 1 by mouth once daily as needed allergies 4)  Equate Anti-diarrhea  .... As directed 5)  Pepto-bismol  .... As directed 6)  Gas-x  .... As  directed 7)  Align Caps (Probiotic product) .[name].. 1 by mouth once daily for your intesinal flora restoraion after you finish metronidazole 8)  Prilosec 20 Mg Cpdr (Omeprazole) .[name].. 1 poq d 9)  Librax 2.5-5 Mg Caps (Clidinium-chlordiazepoxide) .[name].. 1 by mouth qid as needed spasms and anxiety 10)  Lomotil 2.5-0.025 Mg Tabs (Diphenoxylate-atropine) .[name].. 1-2 by mouth qid as needed diarrhea 11)  Megestrol Acetate 20 Mg Tabs (Megestrol acetate) .[name].. 1 by mouth two times a day for hot flashes  Patient Instructions: 1)  Please schedule a follow-up appointment in 4 months. Prescriptions: LOMOTIL 2.5-0.025 MG TABS (DIPHENOXYLATE-ATROPINE) 1-2 by mouth qid as needed diarrhea  #60 x 1   Entered and Authorized by:   Tresa Garter MD   Signed by:   Tresa Garter MD on 01/11/2010   Method used:   Print then Give to Patient   RxID:   [id number] LIBRAX 2.5-5 MG CAPS (CLIDINIUM-CHLORDIAZEPOXIDE) 1 by mouth qid  #60 x 3   Entered and Authorized by:   Tresa Garter MD   Signed by:   Tresa Garter MD on 01/11/2010   Method used:   Print then Give to Patient   RxID:   [id number]

## 2010-05-04 NOTE — Assessment & Plan Note (Signed)
Summary: DIARRHEA/ NO ENERGY/NWS   Vital Signs:  Patient profile:   77 year old male Height:      73.25 inches Weight:      200 pounds BMI:     26.30 O2 Sat:      96 % on Room air Temp:     97.9 degrees F oral Pulse rate:   90 / minute Pulse rhythm:   regular Resp:     16 per minute BP sitting:   122 / 72  (left arm) Cuff size:   large  Vitals Entered By: Rock Nephew CMA (January 18, 2010 10:14 AM)  Nutrition Counseling: Patient's BMI is greater than 25 and therefore counseled on weight management options.  O2 Flow:  Room air CC: Pt c/o cont diarrhea and lack of energy. last rx lomotil/megestrol not helping, Diarrhea   Primary Care Ritu Gagliardo:  AlekseiPlotnikov, M.D.  CC:  Pt c/o cont diarrhea and lack of energy. last rx lomotil/megestrol not helping and Diarrhea.  History of Present Illness:  Diarrhea      This is a 77 year old man who presents with Diarrhea.  The symptoms began 3 months ago.  The severity is described as moderate.  The patient reports 4-6 stools per day, watery/unformed stools, alternating diarrhea/constipation, gassiness, and gradual onset of symptoms, but denies voluminous stools, blood in stool, mucus in stool, greasy stools, malodorous stools, fecal urgency, fecal soiling, nocturnal diarrhea, fasting diarrhea, bloating, and abrupt onset of symptoms.  Associated symptoms include abdominal cramps.  The patient denies fever, abdominal pain, nausea, vomiting, lightheadedness, increased thirst, weight loss, joint pains, mouth ulcers, and eye redness.  Patient's risk factors for diarrhea include recent antibiotic use.  Patient has a  history of radiation therapy.  Meds that he has tried include Kaopectate, Pepto-bismol, Gas-x, Align, Lomotil, Librax, metronidazle on 10/03  (this helped at first but then he developed constipation), and Megace.  Preventive Screening-Counseling & Management  Alcohol-Tobacco     Alcohol drinks/day: 0     Smoking Status: quit  Tobacco Counseling: to remain off tobacco products  Hep-HIV-STD-Contraception     Hepatitis Risk: no risk noted     HIV Risk: no risk noted     STD Risk: no risk noted      Sexual History:  currently monogamous.        Drug Use:  never.        Blood Transfusions:  no.    Clinical Review Panels:  Prevention   Last Colonoscopy:  Location:  [facility].  (12/19/2007)   Last PSA:  0.46 (01/10/2007)  Immunizations   Last Flu Vaccine:  Fluvax 3+ (01/07/2010)   Last H1N1 Vaccine 1:  Given (03/11/2008)   Last Pneumovax:  Pneumovax (02/18/2008)  Lipid Management   Cholesterol:  167 (01/10/2007)   LDL (bad choesterol):  102 (01/10/2007)   HDL (good cholesterol):  47.7 (01/10/2007)  Diabetes Management   HgBA1C:  6.1 (01/10/2007)   Creatinine:  0.8 (11/19/2009)   Last Flu Vaccine:  Fluvax 3+ (01/07/2010)   Last Pneumovax:  Pneumovax (02/18/2008)  CBC   WBC:  6.5 (02/04/2009)   RBC:  4.72 (02/04/2009)   Hgb:  15.7 (02/04/2009)   Hct:  46.4 (02/04/2009)   Platelets:  179.0 (02/04/2009)   MCV  98.5 (02/04/2009)   MCHC  33.9 (02/04/2009)   RDW  13.2 (02/04/2009)   PMN:  72.7 (02/04/2009)   Lymphs:  16.6 (02/04/2009)   Monos:  9.0 (02/04/2009)   Eosinophils:  1.4 (  02/04/2009)   Basophil:  0.3 (02/04/2009)  Complete Metabolic Panel   Glucose:  100 (11/19/2009)   Sodium:  141 (11/19/2009)   Potassium:  4.2 (11/19/2009)   Chloride:  109 (11/19/2009)   CO2:  26 (11/19/2009)   BUN:  12 (11/19/2009)   Creatinine:  0.8 (11/19/2009)   Albumin:  3.7 (11/19/2009)   Total Protein:  6.0 (11/19/2009)   Calcium:  8.8 (11/19/2009)   Total Bili:  0.5 (11/19/2009)   Alk Phos:  63 (11/19/2009)   SGPT (ALT):  18 (11/19/2009)   SGOT (AST):  24 (11/19/2009)   Medications Prior to Update: 1)  Terazosin Hcl 2 Mg  Caps (Terazosin Hcl) .[name].. 1 By Mouth Qd 2)  Vitamin D 1000 Unit Tabs (Cholecalciferol) .[name].. 1 By Mouth Qd 3)  Loratadine 10 Mg Tabs (Loratadine) .[name].. 1 By Mouth  Once Daily As Needed Allergies 4)  Equate Anti-Diarrhea .... As Directed 5)  Pepto-Bismol .... As Directed 6)  Gas-X .[name].. As Directed 7)  Align  Caps (Probiotic Product) .[name].. 1 By Mouth Once Daily For Your Intesinal Flora Restoraion After You Finish Metronidazole 8)  Prilosec 20 Mg Cpdr (Omeprazole) .[name].. 1 Poq D 9)  Librax 2.5-5 Mg Caps (Clidinium-Chlordiazepoxide) .[name].. 1 By Mouth Qid As Needed Spasms and Anxiety 10)  Lomotil 2.5-0.025 Mg Tabs (Diphenoxylate-Atropine) .[name].. 1-2 By Mouth Qid As Needed Diarrhea 11)  Megestrol Acetate 20 Mg Tabs (Megestrol Acetate) .[name].. 1 By Mouth Two Times A Day For Hot Flashes  Current Medications (verified): 1)  Terazosin Hcl 2 Mg  Caps (Terazosin Hcl) .[name].. 1 By Mouth Qd 2)  Vitamin D 1000 Unit Tabs (Cholecalciferol) .[name].. 1 By Mouth Qd 3)  Loratadine 10 Mg Tabs (Loratadine) .[name].. 1 By Mouth Once Daily As Needed Allergies 4)  Equate Anti-Diarrhea .... As Directed 5)  Pepto-Bismol .... As Directed 6)  Gas-X .[name].. As Directed 7)  Align  Caps (Probiotic Product) .[name].. 1 By Mouth Once Daily For Your Intesinal Flora Restoraion After You Finish Metronidazole 8)  Prilosec 20 Mg Cpdr (Omeprazole) .[name].. 1 Poq D 9)  Librax 2.5-5 Mg Caps (Clidinium-Chlordiazepoxide) .[name].. 1 By Mouth Qid As Needed Spasms and Anxiety 10)  Lomotil 2.5-0.025 Mg Tabs (Diphenoxylate-Atropine) .[name].. 1-2 By Mouth Qid As Needed Diarrhea 11)  Megestrol Acetate 20 Mg Tabs (Megestrol Acetate) .[name].. 1 By Mouth Two Times A Day For Hot Flashes  Allergies (verified): 1)  ! Aspirin (Aspirin) 2)  ! Iodine (Iodine) 3)  ! Zithromax (Azithromycin) 4)  ! Ceftin (Cefuroxime Axetil)  Past History:  Past Medical History: Last updated: 04/24/2009 Anxiety GERD Hypertension Gastric ulcer disease, june 2008 in hosp for prostate surgery (may have been cameron's type ulcer from twisting HH) Dr Christella Hartigan ED Colonic polyps, hx of Prostate CA 2010 Dr Lenn Sink Vit D def Insomnia hiatal hernia Prostate cancer  2010 seeds and XRT Syncope 2011 from Bicalutamide  Past Surgical History: Last updated: 08/17/2007 Transurethral resection of prostate  Family History: Last updated: 02/18/2008 Family History Hypertension M 102 No FH of Colon Cancer: father had pancreatic cancer  Social History: Last updated: 11/23/2009 Retired Single, getting married in Nov 2011 Former Smoker  Risk Factors: Alcohol Use: 0 (01/18/2010)  Risk Factors: Smoking Status: quit (01/18/2010)  Family History: Reviewed history from 02/18/2008 and no changes required. Family History Hypertension M 102 No FH of Colon Cancer: father had pancreatic cancer  Social History: Reviewed history from 11/23/2009 and no changes required. Retired Single, getting married in Nov 2011 Former Smoker Hepatitis Risk:  no risk noted HIV Risk:  no risk noted STD Risk:  no risk noted Sexual History:  currently monogamous Drug Use:  never Blood Transfusions:  no  Review of Systems  The patient denies anorexia, fever, weight loss, weight gain, chest pain, syncope, dyspnea on exertion, peripheral edema, prolonged cough, headaches, hemoptysis, abdominal pain, melena, hematochezia, severe indigestion/heartburn, hematuria, suspicious skin lesions, difficulty walking, depression, abnormal bleeding, enlarged lymph nodes, and angioedema.   GI:  Complains of constipation, diarrhea, and gas; denies abdominal pain, bloody stools, dark tarry stools, excessive appetite, hemorrhoids, indigestion, loss of appetite, nausea, vomiting, and yellowish skin color.  Physical Exam  General:  alert, well-developed, well-nourished, well-hydrated, appropriate dress, normal appearance, healthy-appearing, cooperative to examination, and good hygiene.   Head:  normocephalic, atraumatic, no abnormalities observed, and no abnormalities palpated.   Eyes:  no icterus Mouth:  Oral mucosa and oropharynx without lesions or exudates.  Teeth in good repair. Neck:   supple, full ROM, no masses, no thyromegaly, no thyroid nodules or tenderness, no JVD, normal carotid upstroke, no carotid bruits, no cervical lymphadenopathy, and no neck tenderness.   Lungs:  normal respiratory effort, no intercostal retractions, no accessory muscle use, normal breath sounds, no dullness, no fremitus, no crackles, and no wheezes.   Heart:  normal rate, regular rhythm, no murmur, no gallop, no rub, and no JVD.   Abdomen:  soft, non-tender, normal bowel sounds, no distention, no masses, no guarding, no rigidity, no rebound tenderness, no abdominal hernia, no inguinal hernia, no hepatomegaly, and no splenomegaly.   Msk:  No deformity or scoliosis noted of thoracic or lumbar spine.   Pulses:  R and L carotid,radial,femoral,dorsalis pedis and posterior tibial pulses are full and equal bilaterally Extremities:  No clubbing, cyanosis, edema, or deformity noted with normal full range of motion of all joints.   Neurologic:  No cranial nerve deficits noted. Station and gait are normal. Plantar reflexes are down-going bilaterally. DTRs are symmetrical throughout. Sensory, motor and coordinative functions appear intact. Skin:  Intact without suspicious lesions or rashes Cervical Nodes:  No lymphadenopathy noted Psych:  Cognition and judgment appear intact. Alert and cooperative with normal attention span and concentration. No apparent delusions, illusions, hallucinations   Impression & Recommendations:  Problem # 1:  DIARRHEA (ICD-787.91) Assessment Deteriorated I will screen for inflammatory bowel dz., infection, malabsorption, sprue, he may need a GI referral to look for radiation bowel disease, and will stop the meds that he says are not helping. The following medications were removed from the medication list:    Align Caps (Probiotic product) .[name]... 1 by mouth once daily for your intesinal flora restoraion after you finish metronidazole    Lomotil 2.5-0.025 Mg Tabs  (Diphenoxylate-atropine) .[name]... 1-2 by mouth qid as needed diarrhea  Orders: T-Culture, C-Diff Toxin A/B (16109-60454) T-Fecal WBC (09811-91478) T-Stool for O&P (29562-13086) T-Stool Giardia / Crypto- EIA (57846) T-Stool Fats [place] Stain [phone]) TLB-CMP (Comprehensive Metabolic Pnl) (80053-COMP) TLB-CRP-High Sensitivity (C-Reactive Protein) (86140-FCRP) TLB-CBC Platelet - w/Differential (85025-CBCD) TLB-B12 + Folate Pnl (24401_02725-D66/YQI) T-Sprue Panel (Celiac Disease Aby Eval) (83516x3/[address]) T-Vitamin D (25-Hydroxy) (34742-59563) T-Beta Carotene (87564-33295) Gastroenterology Referral (GI)  Problem # 2:  VITAMIN D DEFICIENCY (ICD-268.9) Assessment: Unchanged  Orders: TLB-CMP (Comprehensive Metabolic Pnl) (80053-COMP) TLB-CRP-High Sensitivity (C-Reactive Protein) (86140-FCRP) TLB-CBC Platelet - w/Differential (85025-CBCD) TLB-B12 + Folate Pnl (18841_66063-K16/WFU) T-Sprue Panel (Celiac Disease Aby Eval) (83516x3/[address]) T-Vitamin D (25-Hydroxy) (93235-57322) T-Beta Carotene (02542-70623)  Problem # 3:  HYPERTENSION (ICD-401.9) Assessment: Improved  His updated medication list for this problem  includes:    Terazosin Hcl 2 Mg Caps (Terazosin hcl) .[name]... 1 by mouth qd  Orders: TLB-CMP (Comprehensive Metabolic Pnl) (80053-COMP) TLB-CRP-High Sensitivity (C-Reactive Protein) (86140-FCRP) TLB-CBC Platelet - w/Differential (85025-CBCD) TLB-B12 + Folate Pnl (16109_60454-U98/JXB) T-Sprue Panel (Celiac Disease Aby Eval) (83516x3/[address]) T-Vitamin D (25-Hydroxy) (14782-95621) T-Beta Carotene (30865-78469)  BP today: 122/72 Prior BP: 140/84 (01/11/2010)  Labs Reviewed: K+: 4.2 (11/19/2009) Creat: : 0.8 (11/19/2009)   Chol: 167 (01/10/2007)   HDL: 47.7 (01/10/2007)   LDL: 102 (01/10/2007)   TG: 89 (01/10/2007)  Complete Medication List: 1)  Terazosin Hcl 2 Mg Caps (Terazosin hcl) .[name].. 1 by mouth qd 2)  Vitamin D 1000 Unit Tabs (Cholecalciferol) .[name].. 1 by  mouth qd 3)  Loratadine 10 Mg Tabs (Loratadine) .[name].. 1 by mouth once daily as needed allergies 4)  Prilosec 20 Mg Cpdr (Omeprazole) .[name].. 1 poq d 5)  Librax 2.5-5 Mg Caps (Clidinium-chlordiazepoxide) .[name].. 1 by mouth qid as needed spasms and anxiety 6)  Megestrol Acetate 20 Mg Tabs (Megestrol acetate) .[name].. 1 by mouth two times a day for hot flashes  Patient Instructions: 1)  Please schedule a follow-up appointment in 2 weeks.   Orders Added: 1)  T-Culture, C-Diff Toxin A/B [[id number]] 2)  T-Fecal WBC [44010-27253] 3)  T-Stool for O&P [87177-70555] 4)  T-Stool Giardia / Crypto- EIA [66440] 5)  T-Stool Fats [place] Stain [34742-59563] 6)  TLB-CMP (Comprehensive Metabolic Pnl) [80053-COMP] 7)  TLB-CRP-High Sensitivity (C-Reactive Protein) [86140-FCRP] 8)  TLB-CBC Platelet - w/Differential [85025-CBCD] 9)  TLB-B12 + Folate Pnl [82746_82607-B12/FOL] 10)  T-Sprue Panel (Celiac Disease Aby Eval) [83516x3/[address]] 11)  T-Vitamin D (25-Hydroxy) [87564-33295] 12)  T-Beta Carotene [18841-66063] 13)  Gastroenterology Referral [GI] 14)  Est. Patient Level IV [01601]

## 2010-05-04 NOTE — Assessment & Plan Note (Signed)
Summary: 3 MO ROV /NWS #--RS'D/CD   Vital Signs:  Patient profile:   77 year old male Height:      73.25 inches Weight:      204.25 pounds BMI:     26.86 O2 Sat:      95 % on Room air Temp:     97.4 degrees F oral Pulse rate:   88 / minute BP sitting:   104 / 60  (left arm) Cuff size:   regular  Vitals Entered By: Lucious Groves (July 22, 2009 10:16 AM)  O2 Flow:  Room air CC: 3 mo rtn ov, continued  diarrhea therapy./kb Is Patient Diabetic? No Pain Assessment Patient in pain? no        Primary Care Provider:  AlekseiPlotnikov, M.D.  CC:  3 mo rtn ov and continued  diarrhea therapy./kb.  History of Present Illness: The patient presents for a follow up of hypertension, diarrhea, hyperlipidemia   Current Medications (verified): 1)  Terazosin Hcl 2 Mg  Caps (Terazosin Hcl) .[name].. 1 By Mouth Qd 2)  Align  Caps (Misc Intestinal Flora Regulat) .[name].. 1 By Mouth Once Daily X 2 Wks After Flagyl 3)  Vitamin D 1000 Unit Tabs (Cholecalciferol) .[name].. 1 By Mouth Qd 4)  Align  Caps (Probiotic Product) .[name].. 1 By Mouth Once Daily For Your Intesinal Flora Restoraion 5)  Equate Anti-Diarrhea .... As Directed 6)  Pepto-Bismol .... As Directed 7)  Gas-X .[name].. As Directed  Allergies (verified): 1)  ! Aspirin (Aspirin) 2)  ! Iodine (Iodine) 3)  ! Zithromax (Azithromycin) 4)  ! Ceftin (Cefuroxime Axetil)  Past History:  Past Medical History: Last updated: 04/24/2009 Anxiety GERD Hypertension Gastric ulcer disease, june 2008 in hosp for prostate surgery (may have been cameron's type ulcer from twisting HH) Dr Christella Hartigan ED Colonic polyps, hx of Prostate CA 2010 Dr Lenn Sink Vit D def Insomnia hiatal hernia Prostate cancer 2010 seeds and XRT Syncope 2011 from Bicalutamide  Past Surgical History: Last updated: 08/17/2007 Transurethral resection of prostate  Social History: Last updated: 04/19/2007 Retired Single Former Smoker  Review of Systems  The patient denies weight  loss, dyspnea on exertion, prolonged cough, and abdominal pain.    Physical Exam  General:  Well-developed,well-nourished,in no acute distress; alert,appropriate and cooperative throughout examination Ears:  External ear exam shows no significant lesions or deformities.  Otoscopic examination reveals clear canals, tympanic membranes are intact bilaterally without bulging, retraction, inflammation or discharge. Hearing is grossly normal bilaterally. Mouth:  Oral mucosa and oropharynx without lesions or exudates.  Teeth in good repair. Lungs:  Normal respiratory effort, chest expands symmetrically. Lungs are clear to auscultation, no crackles or wheezes. Heart:  Normal rate and regular rhythm. S1 and S2 normal without gallop, murmur, click, rub or other extra sounds. Abdomen:  Bowel sounds positive,abdomen soft and non-tender without masses, organomegaly or hernias noted. Msk:  No deformity or scoliosis noted of thoracic or lumbar spine.   Extremities:  No clubbing, cyanosis, edema, or deformity noted with normal full range of motion of all joints.   Neurologic:  No cranial nerve deficits noted. Station and gait are normal. Plantar reflexes are down-going bilaterally. DTRs are symmetrical throughout. Sensory, motor and coordinative functions appear intact. Skin:  Intact without suspicious lesions or rashes Psych:  Cognition and judgment appear intact. Alert and cooperative with normal attention span and concentration. No apparent delusions, illusions, hallucinations   Impression & Recommendations:  Problem # 1:  DIARRHEA (ICD-787.91) XRT related Assessment Unchanged Try Questran  Welchol was too $$ His updated medication list for this problem includes:    Glass blower/designer (Misc intestinal flora regulat) .[name]... 1 by mouth once daily x 2 wks after flagyl    Align Caps (Probiotic product) .[name]... 1 by mouth once daily for your intesinal flora restoraion  Problem # 2:  WEIGHT LOSS, ABNORMAL  (ICD-783.21) Assessment: Improved  Problem # 3:  SYNCOPE (ICD-780.2) Assessment: Comment Only No reoccurence  Problem # 4:  HYPERTENSION (ICD-401.9) Assessment: Unchanged  His updated medication list for this problem includes:    Terazosin Hcl 2 Mg Caps (Terazosin hcl) .[name]... 1 by mouth qd  Complete Medication List: 1)  Terazosin Hcl 2 Mg Caps (Terazosin hcl) .[name].. 1 by mouth qd 2)  Align Caps (Misc intestinal flora regulat) .[name].. 1 by mouth once daily x 2 wks after flagyl 3)  Vitamin D 1000 Unit Tabs (Cholecalciferol) .[name].. 1 by mouth qd 4)  Align Caps (Probiotic product) .[name].. 1 by mouth once daily for your intesinal flora restoraion 5)  Equate Anti-diarrhea  .... As directed 6)  Pepto-bismol  .... As directed 7)  Gas-x  .... As directed 8)  Questran 4 Gm Pack (Cholestyramine) .[name].. 1 by mouth two times a day as needed diarrhea 9)  Loratadine 10 Mg Tabs (Loratadine) .[name].. 1 by mouth once daily as needed allergies  Patient Instructions: 1)  Please schedule a follow-up appointment in 4 months. 2)  BMP prior to visit, ICD-9: 3)  Hepatic Panel prior to visit, ICD-9: 4)  TSH prior to visit, ICD-9: 995.20 5)  Urine-dip prior to visit, ICD-9: Prescriptions: LORATADINE 10 MG TABS (LORATADINE) 1 by mouth once daily as needed allergies  #30 x 6   Entered and Authorized by:   Tresa Garter MD   Signed by:   Tresa Garter MD on 07/22/2009   Method used:   Print then Give to Patient   RxID:   [id number] QUESTRAN 4 GM PACK (CHOLESTYRAMINE) 1 by mouth two times a day as needed diarrhea  #60 x 6   Entered and Authorized by:   Tresa Garter MD   Signed by:   Tresa Garter MD on 07/22/2009   Method used:   Print then Give to Patient   RxID:   [id number]

## 2010-05-04 NOTE — Procedures (Signed)
Summary: Esophagoscopy/[place]  Esophagoscopy/[place]   Imported By: Sherian Rein 02/19/2010 12:39:21  _____________________________________________________________________  External Attachment:    Type:   Image     Comment:   External Document

## 2010-05-04 NOTE — Assessment & Plan Note (Signed)
Summary: PASSED OUT IN [facility]/ WENT TO ER/NWS   Vital Signs:  Patient profile:   77 year old male Weight:      204 pounds Temp:     98.7 degrees F oral Pulse rate:   86 / minute BP sitting:   148 / 74  (left arm)  Vitals Entered By: Tora Perches (April 24, 2009 4:29 PM) CC: passed out sunday at church--now f/u from er   Is Patient Diabetic? No   Primary Care Provider:  AlekseiPlotnikov, M.D.  CC:  passed out sunday at church--now f/u from er  .  History of Present Illness: The patient presents for a post-hospital ER visit on Sun at [facility] for syncope. He was taking Bicalutamide then   Preventive Screening-Counseling & Management  Alcohol-Tobacco     Smoking Status: quit  Current Medications (verified): 1)  Terazosin Hcl 2 Mg  Caps (Terazosin Hcl) .[name].. 1 By Mouth Qd 2)  Zegerid 20-1100 Mg Caps (Omeprazole-Sodium Bicarbonate) .[name].. 1 By Mouth Q Am 3)  Ranitidine Hcl 300 Mg Tabs (Ranitidine Hcl) .[name].. 1 Po Qd 4)  Align  Caps (Misc Intestinal Flora Regulat) .[name].. 1 By Mouth Once Daily X 2 Wks After Flagyl 5)  Welchol 625 Mg Tabs (Colesevelam Hcl) .[name].. 1-2 As Needed Diarrhea  Allergies: 1)  ! Aspirin (Aspirin) 2)  ! Iodine (Iodine) 3)  ! Zithromax (Azithromycin) 4)  ! Ceftin (Cefuroxime Axetil)  Past History:  Past Surgical History: Last updated: 08/17/2007 Transurethral resection of prostate  Social History: Last updated: 04/19/2007 Retired Single Former Smoker  Past Medical History: Anxiety GERD Hypertension Gastric ulcer disease, june 2008 in hosp for prostate surgery (may have been cameron's type ulcer from twisting HH) Dr Christella Hartigan ED Colonic polyps, hx of Prostate CA 2010 Dr Lenn Sink Vit D def Insomnia hiatal hernia Prostate cancer 2010 seeds and XRT Syncope 2011 from Bicalutamide  Physical Exam  General:  Well-developed,well-nourished,in no acute distress; alert,appropriate and cooperative throughout examination Nose:  External nasal examination  shows no deformity or inflammation. Nasal mucosa are pink and moist without lesions or exudates. Mouth:  Oral mucosa and oropharynx without lesions or exudates.  Teeth in good repair. Lungs:  Normal respiratory effort, chest expands symmetrically. Lungs are clear to auscultation, no crackles or wheezes. Heart:  Normal rate and regular rhythm. S1 and S2 normal without gallop, murmur, click, rub or other extra sounds. Abdomen:  Bowel sounds positive,abdomen soft and non-tender without masses, organomegaly or hernias noted. Msk:  No deformity or scoliosis noted of thoracic or lumbar spine.   Neurologic:  No cranial nerve deficits noted. Station and gait are normal. Plantar reflexes are down-going bilaterally. DTRs are symmetrical throughout. Sensory, motor and coordinative functions appear intact. Skin:  Intact without suspicious lesions or rashes Psych:  Cognition and judgment appear intact. Alert and cooperative with normal attention span and concentration. No apparent delusions, illusions, hallucinations   Impression & Recommendations:  Problem # 1:  SYNCOPE (ICD-780.2) due to medication Assessment New  Problem # 2:  DIARRHEA (ICD-787.91) better on Flagyl Assessment: Improved XRT is pending - watch for more symptoms  His updated medication list for this problem includes:    Align Caps (Misc intestinal flora regulat) .[name]... 1 by mouth once daily x 2 wks after flagyl  Problem # 3:  ERECTILE DYSFUNCTION (CBJ-628.31) Assessment: Unchanged  Problem # 4:  HYPERTENSION (ICD-401.9) Assessment: Comment Only  His updated medication list for this problem includes:    Terazosin Hcl 2 Mg Caps (Terazosin hcl) .Marland KitchenMarland KitchenMarland KitchenMarland Kitchen  1 by mouth qd  Problem # 5:  PROSTATE CANCER, HX OF (ICD-V10.46) Assessment: Comment Only XRT is pending   Complete Medication List: 1)  Terazosin Hcl 2 Mg Caps (Terazosin hcl) .[name].. 1 by mouth qd 2)  Zegerid 20-1100 Mg Caps (Omeprazole-sodium bicarbonate) .[name].. 1 by mouth q am 3)   Ranitidine Hcl 300 Mg Tabs (Ranitidine hcl) .[name].. 1 po qd 4)  Align Caps (Misc intestinal flora regulat) .[name].. 1 by mouth once daily x 2 wks after flagyl 5)  Metronidazole 250 Mg Tabs (Metronidazole) .[name].. 1 by mouth qid x 10 d 6)  Vitamin D 1000 Unit Tabs (Cholecalciferol) .[name].. 1 by mouth qd 7)  Welchol 3.75 Gm Pack (Colesevelam hcl) .[name].. 1 by mouth once daily 8)  Align Caps (Probiotic product) .[name].. 1 by mouth once daily for your intesinal flora restoraion  Patient Instructions: 1)  Please schedule a follow-up appointment in 3 months. Prescriptions: WELCHOL 3.75 GM PACK (COLESEVELAM HCL) 1 by mouth once daily  #30 x 12   Entered and Authorized by:   Tresa Garter MD   Signed by:   Tresa Garter MD on 04/24/2009   Method used:   Print then Give to Patient   RxID:   [id number] METRONIDAZOLE 250 MG TABS (METRONIDAZOLE) 1 by mouth qid x 10 d  #40 x 3   Entered and Authorized by:   Tresa Garter MD   Signed by:   Tresa Garter MD on 04/24/2009   Method used:   Print then Give to Patient   RxID:   [id number]

## 2010-05-06 NOTE — Letter (Signed)
[facility]: [facility]   Imported By: Sherian Rein 03/25/2010 09:40:39  _____________________________________________________________________  External Attachment:    Type:   Image     Comment:   External Document

## 2010-05-06 NOTE — Assessment & Plan Note (Signed)
Summary: pain in left hip/lb   Vital Signs:  Patient profile:   77 year old male Height:      73.25 inches Weight:      199 pounds BMI:     26.17 O2 Sat:      96 % on Room air Temp:     98.0 degrees F oral Pulse rate:   73 / minute BP sitting:   120 / 70  (left arm) Cuff size:   regular  Vitals Entered By: Bill Salinas CMA (April 28, 2010 1:11 PM)  O2 Flow:  Room air CC: pt here for evaluation of left hip pain/ ab   Primary Care Provider:  Tiburcio Linder, M.D.  CC:  pt here for evaluation of left hip pain/ ab.  History of Present Illness: C/o L hip pain worse with standing and turning - started after he joined the Gym 1 month ago. It is more in the groin.  Current Medications (verified): 1)  Terazosin Hcl 2 Mg  Caps (Terazosin Hcl) .[name].. 1 By Mouth Qd 2)  Vitamin D 1000 Unit Tabs (Cholecalciferol) .[name].. 1 By Mouth Qd 3)  Loratadine 10 Mg Tabs (Loratadine) .[name].. 1 By Mouth Once Daily As Needed Allergies 4)  Prilosec 20 Mg Cpdr (Omeprazole) .[name].. 1 Poq D 5)  Librax 2.5-5 Mg Caps (Clidinium-Chlordiazepoxide) .[name].. 1 By Mouth Qid As Needed Spasms and Anxiety 6)  Megestrol Acetate 20 Mg Tabs (Megestrol Acetate) .[name].. 1 By Mouth Once Daily 7)  Flagyl 250 Mg Tabs (Metronidazole) .[name].. 1 By Mouth Once Daily 8)  Stool Softener 100 Mg Caps (Docusate Sodium) .... 2 By Mouth Once Daily  Allergies (verified): 1)  ! Aspirin (Aspirin) 2)  ! Iodine (Iodine) 3)  ! Zithromax (Azithromycin) 4)  ! Ceftin (Cefuroxime Axetil) 5)  Naprosyn  Physical Exam  General:  alert, well-developed, well-nourished, well-hydrated, appropriate dress, normal appearance, healthy-appearing, cooperative to examination, and good hygiene.   Msk:  LS NT B hips w/good ROM L groin with tender mid to lat aspect. No mass Skin:  WNL Psych:  Oriented X3.     Impression & Recommendations:  Problem # 1:  HIP PAIN (ICD-719.45) L  Assessment New  His updated medication list for this problem includes:    Tramadol  Hcl 50 Mg Tabs (Tramadol hcl) .[name]... 1-2 tabs by mouth two times a day as needed pain  Orders: T-Hip Comp Left Min 2-views (73510TC)  Complete Medication List: 1)  Terazosin Hcl 2 Mg Caps (Terazosin hcl) .[name].. 1 by mouth qd 2)  Vitamin D 1000 Unit Tabs (Cholecalciferol) .[name].. 1 by mouth qd 3)  Loratadine 10 Mg Tabs (Loratadine) .[name].. 1 by mouth once daily as needed allergies 4)  Prilosec 20 Mg Cpdr (Omeprazole) .[name].. 1 poq d 5)  Librax 2.5-5 Mg Caps (Clidinium-chlordiazepoxide) .[name].. 1 by mouth qid as needed spasms and anxiety 6)  Megestrol Acetate 20 Mg Tabs (Megestrol acetate) .[name].. 1 by mouth once daily 7)  Flagyl 250 Mg Tabs (Metronidazole) .[name].. 1 by mouth once daily 8)  Stool Softener 100 Mg Caps (Docusate sodium) .... 2 by mouth once daily 9)  Tramadol Hcl 50 Mg Tabs (Tramadol hcl) .[name].. 1-2 tabs by mouth two times a day as needed pain  Patient Instructions: 1)  Please schedule a follow-up appointment in 1 month. 2)  Go on Youtube ([email or web address].com) and look up "Ileopsoas stretch". Learn the anatomy and learn the symptoms. Do the stretches - it may help!  3)  Call if you are not better  in a reasonable amount of time or if worse.  Prescriptions: TRAMADOL HCL 50 MG TABS (TRAMADOL HCL) 1-2 tabs by mouth two times a day as needed pain  #60 x 0   Entered and Authorized by:   Tresa Garter MD   Signed by:   Tresa Garter MD on 04/28/2010   Method used:   Print then Give to Patient   RxID:   [id number]    Orders Added: 1)  T-Hip Comp Left Min 2-views [73510TC] 2)  Est. Patient Level III [14782]

## 2010-05-07 NOTE — Procedures (Signed)
Summary: [facility]  [facility]   Imported By: Sherian Rein 02/19/2010 12:41:05  _____________________________________________________________________  External Attachment:    Type:   Image     Comment:   External Document

## 2010-06-21 LAB — URINALYSIS, ROUTINE W REFLEX MICROSCOPIC
Protein, ur: NEGATIVE mg/dL
Urobilinogen, UA: 0.2 mg/dL (ref 0.0–1.0)

## 2010-06-21 LAB — URINE MICROSCOPIC-ADD ON

## 2010-06-21 LAB — DIFFERENTIAL
Basophils Relative: 1 % (ref 0–1)
Lymphocytes Relative: 10 % — ABNORMAL LOW (ref 12–46)
Lymphs Abs: 0.8 10*3/uL (ref 0.7–4.0)
Monocytes Relative: 5 % (ref 3–12)
Neutro Abs: 6.7 10*3/uL (ref 1.7–7.7)
Neutrophils Relative %: 83 % — ABNORMAL HIGH (ref 43–77)

## 2010-06-21 LAB — POCT CARDIAC MARKERS
CKMB, poc: <1 ng/mL — ABNORMAL LOW (ref 1.0–8.0)
Myoglobin, poc: 71.7 ng/mL (ref 12–200)

## 2010-06-21 LAB — BASIC METABOLIC PANEL
BUN: 12 mg/dL (ref 6–23)
Chloride: 107 mEq/L (ref 96–112)
Potassium: 4.6 mEq/L (ref 3.5–5.1)
Sodium: 140 mEq/L (ref 135–145)

## 2010-06-21 LAB — CBC
MCHC: 34.3 g/dL (ref 30.0–36.0)
RBC: 4.59 MIL/uL (ref 4.22–5.81)
WBC: 8 10*3/uL (ref 4.0–10.5)

## 2010-07-19 ENCOUNTER — Ambulatory Visit (INDEPENDENT_AMBULATORY_CARE_PROVIDER_SITE_OTHER): Payer: Medicare Other | Admitting: Internal Medicine

## 2010-07-19 ENCOUNTER — Encounter: Payer: Self-pay | Admitting: Internal Medicine

## 2010-07-19 DIAGNOSIS — I10 Essential (primary) hypertension: Secondary | ICD-10-CM

## 2010-07-19 DIAGNOSIS — N400 Enlarged prostate without lower urinary tract symptoms: Secondary | ICD-10-CM

## 2010-07-19 DIAGNOSIS — R197 Diarrhea, unspecified: Secondary | ICD-10-CM

## 2010-07-19 DIAGNOSIS — K219 Gastro-esophageal reflux disease without esophagitis: Secondary | ICD-10-CM

## 2010-07-19 DIAGNOSIS — Z8546 Personal history of malignant neoplasm of prostate: Secondary | ICD-10-CM

## 2010-07-19 MED ORDER — TERAZOSIN HCL 2 MG PO CAPS: 2.0000 mg | ORAL_CAPSULE | Freq: Every day | ORAL | Status: DC

## 2010-07-19 MED ORDER — VITAMIN D 1000 UNITS PO TABS: 1000.0000 [IU] | ORAL_TABLET | Freq: Every day | ORAL | Status: DC

## 2010-07-19 MED ORDER — METRONIDAZOLE 250 MG PO TABS: 250.0000 mg | ORAL_TABLET | Freq: Every day | ORAL | Status: DC

## 2010-07-19 NOTE — Assessment & Plan Note (Signed)
Cont Rx 

## 2010-07-19 NOTE — Progress Notes (Signed)
  Subjective:    Patient ID: Jorge [name], male    DOB: Oct 12, 1929, 77 y.o.   MRN: [id number]  HPI  The patient presents for a follow-up of  chronic hypertension, BPH, HH/GERD controlled with medicines    Review of Systems  Constitutional: Negative for fatigue.  HENT: Negative for congestion.   Respiratory: Negative for cough.   Gastrointestinal: Negative for nausea.  Musculoskeletal: Negative for back pain.  Neurological: Negative for numbness.  Psychiatric/Behavioral: Negative for confusion.       Objective:   Physical Exam  Constitutional: He is oriented to person, place, and time. He appears well-developed.       Obese  HENT:  Mouth/Throat: Oropharynx is clear and moist.  Eyes: Conjunctivae are normal. Pupils are equal, round, and reactive to light.  Neck: Normal range of motion. No JVD present. No thyromegaly present.  Cardiovascular: Normal rate, regular rhythm, normal heart sounds and intact distal pulses.  Exam reveals no gallop and no friction rub.   No murmur heard. Pulmonary/Chest: Effort normal and breath sounds normal. No respiratory distress. He has no wheezes. He has no rales. He exhibits no tenderness.  Abdominal: Soft. Bowel sounds are normal. He exhibits no distension and no mass. There is no tenderness. There is no rebound and no guarding.  Musculoskeletal: Normal range of motion. He exhibits no edema and no tenderness.       R floating rib lat pain  Lymphadenopathy:    He has no cervical adenopathy.  Neurological: He is alert and oriented to person, place, and time. He has normal reflexes. No cranial nerve deficit. He exhibits normal muscle tone. Coordination normal.  Skin: Skin is warm and dry. No rash noted.  Psychiatric: He has a normal mood and affect. His behavior is normal. Judgment and thought content normal.          Assessment & Plan:  BENIGN PROSTATIC HYPERTROPHY On Rx  GERD Cont Rx  PROSTATE CANCER, HX OF On  Rx  HYPERTENSION Cont Rx BP Readings from Last 3 Encounters:  07/19/10 116/72  04/28/10 120/70  03/10/10 122/68     DIARRHEA Much better on daily Flagyl - no side effects    R lower rib pain  Much better

## 2010-07-19 NOTE — Assessment & Plan Note (Signed)
Much better on daily Flagyl - no side effects

## 2010-07-19 NOTE — Assessment & Plan Note (Signed)
Cont Rx BP Readings from Last 3 Encounters:  07/19/10 116/72  04/28/10 120/70  03/10/10 122/68

## 2010-07-19 NOTE — Assessment & Plan Note (Signed)
On Rx 

## 2010-08-17 NOTE — H&P (Signed)
NAMESHIN, LAMOUR              ACCOUNT NO.:  [id number]   MEDICAL RECORD NO.:  [id number]          PATIENT TYPE:  INP   LOCATION:  1431                         FACILITY:  [facility]   PHYSICIAN:  Bertram Millard. Dahlstedt, M.D.DATE OF BIRTH:  1929-08-17   DATE OF ADMISSION:  09/18/2006  DATE OF DISCHARGE:  09/28/2006                              HISTORY & PHYSICAL   REASON FOR ADMISSION:  Urinary retention.   BRIEF HISTORY:  A 77 year old male with significant BPH.  He has  recently been in retention despite aggressive medical therapy.  He was  found to have a 180-gram gland on ultrasound in the office.  Due to size  of his gland, I felt that TURP alone would limit the effectiveness of  surgical intervention.  He presents at this time for open prostatectomy.  Risks and complications of the procedure were discussed with the  patient.  He understands these and desires to proceed.   PAST MEDICAL HISTORY:  Significant for appendectomy.  He has a hiatal  hernia/GERD.  He has no other medical history.   MEDICATIONS:  Include both Avodart and Flomax.   ALLERGIES:  TO AZITHROMYCIN AND CEFUROXIME.   The patient is single.  He has a steady girlfriend, Joyce Gross.  He denies  tobacco use, drinks an occasional alcohol beverage.   He has had recent lower urinary tract symptoms including frequency,  urgency and significant nocturia.  These have abated since he had a  catheter placed.  He is able to achieve and maintain erections.  He  denies significant leakage but has urgency of urination.  He has been on  anticholinergics in the past.   Exam revealed a pleasant healthy appearing elderly male.  He is alert  and oriented.  HEENT:  Was normal.  NECK:  Supple without thyromegaly or adenopathy.  CHEST:  Clear.  HEART:  Normal rate and rhythm.  ABDOMEN:  Protuberant, soft, nondistended, nontender.  No mass, no  megaly.  Bladder nonpalpable.  His phallus was normal.  No lesions,  fibrotic areas or  plaques.  Foley catheter was present within the  urethral meatus.  He had a 4+ prostate on examination.   Admission studies include a white count which was 8200, hematocrit 49%,  platelet count 235,000.  The sodium and potassium were normal, glucose  112.  BUN and creatinine were normal.  Chest x-ray revealed a hiatal  hernia and no acute disease.  EKG showed septal infarct, questionable  age; he had incomplete right bundle branch block.   IMPRESSION:  1. Benign prostatic hypertrophy with retention.  2. Hiatal hernia/gastroesophageal reflux disease.   IMPRESSION:  BPH with retention and hiatal hernia with GERD.   PLAN:  Admit for suprapubic prostatectomy.      Bertram Millard. Dahlstedt, M.D.  Electronically Signed     SMD/MEDQ  D:  10/11/2006  T:  10/11/2006  Job:  [id number]   cc:   Georgina Quint. [name], MD  [address]  [place]  [place] [address]

## 2010-08-17 NOTE — Op Note (Signed)
Jorge [name], Jorge [name]              ACCOUNT NO.:  [id number]   MEDICAL RECORD NO.:  [id number]          PATIENT TYPE:  INP   LOCATION:  1223                         FACILITY:  [facility]   PHYSICIAN:  Shirley Friar, MDDATE OF BIRTH:  Dec 09, 1929   DATE OF PROCEDURE:  10/13/2006  DATE OF DISCHARGE:                               OPERATIVE REPORT   INDICATIONS:  GI bleed, history gastric volvulus.   MEDICATIONS:  Fentanyl 100 mcg IV, Versed 10 mg IV.   FINDINGS:  Endoscope was inserted into the oropharynx and the esophagus  was intubated which was normal in its entirety.  The esophageal junction  was noted be approximately 36 cm from the incisors.  Endoscope was  advanced into the stomach which was severely distended with a large  amount of black, thick, liquid-appearing blood with scattered black-  colored blood clots noted that could not be completely aspirated.  Extensive looping occurred with the standard endoscope that prevented  passage of the endoscope through the bottom part of the stomach where a  previously noted twist was seen.   The standard endoscope was then withdrawn; and a pediatric colonoscope  was inserted; and advanced with mild resistance through the distal  stomach into the duodenum.  On careful withdrawal of the endoscope no  bleeding source could be identified.  There was a duodenal diverticulum  noted.  No intraluminal ischemia seen in the distal stomach.  Proximal  and mid stomach could not be adequately visualized due to a large amount  of blood products.  Retroflexion was limited due to a large amount of  blood in the fundus and cardia.   ASSESSMENT:  1. Large amount of old blood in stomach.  2. Enteroscope passed through volvulus, and no intraluminal ischemia      seen in this area.  3. No bleeding source identified.   PLAN:  1. Transfuse  2. Move to the step-down/unit bed.  3. Placed NG suction to low wall suction.  4. Surgical consult.  5. No  active bleeding seen to warrant bleeding scan.  6. NPO.  7. Repeat EGD in 1-2 days after suctioning of old blood.  8. Consider angiogram if the patient becomes unstable.      Shirley Friar, MD  Electronically Signed     VCS/MEDQ  D:  10/13/2006  T:  10/14/2006  Job:  [id number]   cc:   [place] Daphine Deutscher, MD  [address], [address]  [place]  [place] [address]

## 2010-08-17 NOTE — Discharge Summary (Signed)
Jorge [name], Jorge [name]              ACCOUNT NO.:  [id number]   MEDICAL RECORD NO.:  [id number]          PATIENT TYPE:  INP   LOCATION:  1431                         [facility]AC[facility]   PHYSICIAN:  Bertram Millard. Dahlstedt, M.D.DATE OF BIRTH:  [birth year]   DATE OF ADMISSION:  09/18/2006  DATE OF DISCHARGE:  09/28/2006                               DISCHARGE SUMMARY   PRIMARY DIAGNOSES:  1. Benign prostatic hypertrophy with retention.  2. Adenocarcinoma of the prostate incidentally discovered.  3. Hemorrhagic shock.  4. Anemia.  5. Diaphragmatic hernia with gastric volvulus.   PRINCIPAL PROCEDURES:  1. September 18, 2006 suprapubic prostatectomy.  2. September 21, 2006 NG tube insertion.  3. September 23, 2006 EGD.   BRIEF HISTORY:  This 77 year old male is admitted to the hospital for  suprapubic prostatectomy as treatment for a very large prostate of which  it was recommended that he have an open prostatectomy versus TURP due to  the size of his gland.  He has been on maximal medical therapy, but has  continued with urinary retention.  It is quite symptomatic.   MEDICAL HISTORY:  Significant for an appendectomy and hiatal  hernia/GERD.  He is followed by Dr. Matthias Hughs for that.   MEDICATIONS:  Avodart and Flomax.   ALLERGIES:  He is allergic to AZITHROMYCIN and CEFUROXIME.   SOCIAL HISTORY:  The patient is single.  He denies tobacco or  significant alcohol use.   For admission Physical and labs, please see admission note.   HOSPITAL COURSE:  The patient was admitted directly to the operating  room.  He underwent suprapubic prostatectomy.  The prostate actually  came out fairly easily, but there was significant hemorrhage after this.  It was quite difficult to control using sutures and FloSeal.  The  patient received 4 units of blood intraoperatively.  He remained stable  intraoperatively. However, his postoperative course was significant for  hypotension and increased heart rate as well as  tachypnea on September 22, 2006 at approximately 2300 hours.  The patient had a distended abdomen.  Chest x-ray showed significant gastric distension within his chest.  NG  tube was placed, and the patient was transferred to the step-down unit.  Critical care consultation was provided.  The patient was placed on  Cipro IV.  He was hydrated, and it seemed that with the NG tube  placement his tachypnea improved significantly.  Gastroenterology  consultation was provided by Dr. Bosie Clos.  The patient was felt to have  a mild GI bleed.  He was treated with proton pump inhibitors, given IV.  Cultures from his urine and blood revealed no growth.  He was not felt  to have had a cardiac event even though he had slight bump in his  troponin.  It was felt the patient did not have a significant volvulus,  but did have significant distension of his stomach.  It was felt that  this was long-term/chronic in nature.   The patient did receive a couple more units of packed red blood cells.  After this episode he actually improved quite nicely, and by June  26 was  felt to have reached maximal hospital benefit.  He was discharged to  home at this time.  He will follow up with gastroenterology/Dr. Buccini.  He will also follow up with Dr. Luretha Murphy regarding his hiatal  hernia.   Final pathology of the prostate revealed a Gleason 7 cancer discovered  incidentally.  It did involve the margins in both lobes of the prostate.  He had a Gleason's 4+3.   He will follow up with me in the office in approximately one week.  He  was discharged without his urethral catheter but with a suprapubic tube.   MEDICATIONS AT TIME OF DISCHARGE:  1. Doxycycline 100 mg one p.o. q. 12 h. for seven days.  2. Stool softener.  3. He was told to stop his Flomax and his Avodart.  He was sent home      with Prevacid as well.      Bertram Millard. Dahlstedt, M.D.  Electronically Signed     SMD/MEDQ  D:  10/11/2006  T:   10/11/2006  Job:  [id number]

## 2010-08-17 NOTE — H&P (Signed)
NAMEFERMIN, [name]              ACCOUNT NO.:  [id number]   MEDICAL RECORD NO.:  [id number]          PATIENT TYPE:  INP   LOCATION:  1223                         FACILITY:  [facility]   PHYSICIAN:  Shirley Friar, MDDATE OF BIRTH:  [birth year]   DATE OF ADMISSION:  10/12/2006  DATE OF DISCHARGE:                              HISTORY & PHYSICAL   ADMISSION DIAGNOSES:  1. Gastrointestinal bleed.  2. Melena, secondary to #1.  3. History of gastric volvulus.  4. Recent prostate surgery with adenocarcinoma identified.   MEDICINES:  Stool softener, Prevacid.   ALLERGIES:  1. ASPIRIN.  2. IODINE.   HISTORY OF PRESENT ILLNESS:  [name] is a 77 year old white male  with known history of organoaxial gastric volvulus who had an upper GI  bleed in June following prostate surgery.  Since his discharge he  reports black stools every day that have been formed.  Three days ago he  had two or three episodes of black watery stools.  He denies any iron  products, denies any Pepto-Bismol, and denies any NSAIDs.  He has been  using Colace for stool softener.  Today he has been feeling dizzy and  lightheaded and came in for an urgent work-in.  In the office he was  orthostatic with pulse increased to 120 standing from 92 lying down.  Sitting, his pulse was 88.  Blood pressure was 114/68 lying down,  sitting was 120/72 and standing was 100/70.  He felt dizzy during his  visit to the office.  Hemoglobin was done in the office which revealed  9.9, which is stable compared to on day of discharge.  He did have  melena on physical exam today.   PHYSICAL EXAMINATION:  Temperature 97.4, pulse 100, blood pressure  110/68, weight 210 pounds.  Other vital signs as stated above.  GENERAL:  Alert, no acute stress.  ABDOMEN:  Soft, nontender, nondistended, active bowel sounds.  RECTUM:  Black-colored stools in and around anorectal junction.   IMPRESSION:  A 77 year old white male with melenic stools due  to upper  gastrointestinal bleed.  My main concern would be for an ischemic  process in his gut from his volvulus, although during upper GI series  and chest CT during his bleed in June there were no acute changes to it.  Right now he appears to be dehydrated and will be admitted for IV  hydration and supportive care.  Will be given blood transfusions.  I  will plan to do upper endoscopy in a.m. to see if there is any ulcer  source in his proximal stomach and see if the scope can pass through the  volvulus.  He may need to have an angiogram or bleeding scan to further evaluate  whether the source of bleeding is from his volvulus or not.  Other  options include CT enterography in the setting of a GI bleed.  Also may  need surgery re-involved to see if exploration of his volvulus is in  order, depending on results of my evaluation.      Shirley Friar, MD  Electronically Signed  VCS/MEDQ  D:  10/12/2006  T:  10/13/2006  Job:  [id number]   cc:   Georgina Quint. [name], MD  [address]  [place]  [place] [address]   [place] Daphine Deutscher, MD  [address]  [place]  [place] [address]   Bertram Millard. Dahlstedt, M.D.  Fax: [phone]

## 2010-08-17 NOTE — Op Note (Signed)
[name], [name]              ACCOUNT NO.:  [id number]   MEDICAL RECORD NO.:  [id number]          PATIENT TYPE:  INP   LOCATION:  1438                         FACILITY:  [facility]   PHYSICIAN:  Bertram Millard. Dahlstedt, M.D.DATE OF BIRTH:  12/28/1929   DATE OF PROCEDURE:  09/18/2006  DATE OF DISCHARGE:                               OPERATIVE REPORT   PREOPERATIVE DIAGNOSIS:  Benign prostatic hyperplasia with a very large  prostate gland.   POSTOPERATIVE DIAGNOSIS:  Benign prostatic hyperplasia with a very large  prostate gland.   PROCEDURE:  Suprapubic (open) prostatectomy.   SURGEON:  Bertram Millard. Dahlstedt, M.D.   FIRST ASSISTANT:  Terie Purser, M.D.   ANESTHESIA:  General endotracheal.   COMPLICATIONS:  Significant hemorrhage with blood replacement.   SPECIMEN:  Prostate to pathology.   BRIEF HISTORY:  [name] is an elderly male with urinary retention  due to BPH, despite maximum medical therapy.  He has had a catheter in  for the past couple of weeks due to significant retention and nocturia.  I have discussed with the patient the fact that he has a very large  gland, I think too large for TURP.  He presents at this time for an open  prostatectomy as definitive therapy for his BPH and urinary retention.  The risks and complications have been discussed with the patient at  length.  These include but are not limited to infection, urethral  stricture, bleeding, need for transfusion, DVT, PE, among others.  He  understands these and desires to proceed.   DESCRIPTION OF PROCEDURE:  The patient was identified in the holding  area, administered preoperative IV antibiotics, and brought to the  operating room where general anesthetic was administered.  He was placed  in the recumbent position.  His old Foley catheter was removed.  The  lower abdomen, perineum, and genitalia were prepped and draped.  A 22-  French Foley catheter was placed through his urethra and into his  bladder.   A midline incision was made above the pubis and carried down to the  space of Retzius with electrocautery.  A Bookwalter retractor was  placed.  The bladder was filled with saline and then opened midline.  A  very large prostatic adenoma was seen within the lower part of the  bladder.  The bladder dome was then retracted superiorly, and stay  sutures were placed on the lateral edges of the bladder incision.  The  ureteral orifices were then identified and found be well away from the  adenoma.  The adenoma was then scored circumferentially with the Bovie.  I then used my finger to dissect bluntly starting at the anterior-most  aspect of the prostate.  This dissection was then used to separate the  adenoma from the prostatic rim.  This dissection was actually quite  easy, and it was eventually carried down to the apex of the prostate.  Some of the attachments were then cut with scissors.  The prostatic  adenoma was then easily removed and passed from the table.   For the next hour and 15 minutes or so,  hemostasis was achieved.  There  was a significant amount of bleeding anteriorly within the prostatic  fossa.  The posterior oozing and bleeding was easily taken care of by  sutures.  Anteriorly, it was very difficult to see due to the deep  location of the prostate and the anterior location of the venous  bleeding.  I was eventually able to place 2-0 Vicryl sutures  circumferentially in the bleeding areas of the prostate.  During this  procedure, however, there was a significant hemorrhage, between 2500 and  3000 mL.  He received 4 units of blood.  Anesthesia did a very good job  of maintaining his pressure.  I did use FloSeal down in the prostatic  fossa which seemed to aid in the hemostasis.  Eventually, there was  minimal oozing.  Inspection of the prostatic fossa revealed adequate  hemostasis.  There had been a small calculus within the bladder - this  most likely came out  with the packs that had been placed there.  The  ureteral orifices were identified at this point and were found to be  patent, admitting the urine through each one.  The bladder was then  closed anteriorly in two layers using running sutures of 2-0 Vicryl.  The bladder was filled with saline, and there was a watertight closure.  A suprapubic tube was left in and brought through the abdomen in the  left lower quadrant and into the anterior bladder.  It was a 24  Ainsworth catheter.  It was sutured to the skin with 2-0 nylon.  The  pelvis was irrigated.  The bladder closure was hemostatic.  The abdomen  was closed with the fascia with fascial sutures of  #1 PDS placed in  simple running fashion and skin staples.  The urethral catheter and the  suprapubic tube were then hooked to dependent drainage.   The patient tolerated the procedure well.  Estimated blood loss was  approximately 2500 mL, and he received 4 units of blood and crystalloid.  He was extubated and taken to the PACU in stable condition.      Bertram Millard. Dahlstedt, M.D.  Electronically Signed     SMD/MEDQ  D:  09/19/2006  T:  09/19/2006  Job:  [id number]   cc:   Georgina Quint. [name], MD  [address]  [place]  [place] [address]

## 2010-08-17 NOTE — Op Note (Signed)
Jorge [name], Jorge [name]              ACCOUNT NO.:  [id number]   MEDICAL RECORD NO.:  [id number]          PATIENT TYPE:  INP   LOCATION:  1532                         [facility]ACILITY:  [facility]   PHYSICIAN:  Petra Kuba, M.D.    DATE OF BIRTH:  1929-10-30   DATE OF PROCEDURE:  10/15/2006  DATE OF DISCHARGE:                               OPERATIVE REPORT   PROCEDURE:  EGD.   INDICATIONS:  Upper GI bleeding.  Consent was signed after risks,  benefits, methods, options thoroughly discussed multiple times in the  past.   MEDICINES USED:  1. Fentanyl 75 mcg.  2. Versed 8 mg.   PROCEDURE:  First, the video endoscope was inserted by direct vision.  The esophagus was normal.  At the GE junction was some minimal  inflammation and a small superficial ulceration.  Scope passed into the  stomach, and residual fluid and old blood was easily washed and  suctioned.  There were multiple NG tube trauma marks, but at the distal  end of the twist from his large volvulized hiatal hernia, there seemed  to be a long deep linear ulceration.  Washing and watching could not  make it bleed.  We were able to advance through the stomach into a  normal antrum to the pylorus, but at that point there was no more scope  available.  The stomach was thoroughly evaluated on straight and  retroflexed visualization, and no additional findings were seen.  Based  on the shape of his stomach, the anatomy was quite deformed.  After lots  of washing and suctioning and irrigating and ruling out any other  etiologies, and also washing his ulcer multiple times so that it could  not be made to bleed, we elected to slowly withdraw, again confirming a  normal proximal and mid-esophagus.  The pediatric video colonoscope was  inserted, easily advanced down the esophagus, through the stomach, and  into the antrum, pylorus, duodenal bulb, and second, third, and possibly  even fourth part of the duodenum.  No blood was seen distally.  He  did  have a small duodenal diverticulum, but no other abnormalities were  seen.  Air and water were suctioned as we slowly withdrew back into the  stomach.  Scope was slowly withdrawn, again confirming above findings.  Scope was removed.  The patient tolerated the procedure well.  There was  no obvious immediate complication.   ENDOSCOPIC DIAGNOSES:  1. Large hiatal hernia, with probable twisted volvulus, with some mild      to moderate GE junction, inflammation, and shallow ulcer.  2. Nasogastric tube trauma marks throughout the proximal stomach.  3. Primary ulcer at the distal end of the twist I believe because of      recurrent bleeding.  4. No active bleeding seen.  5. Upper scope to the pylorus only.  6. Pediatric colonoscope placed to probably the fourth part of the      duodenum, without any active bleeding, only a duodenal diverticulum      seen.   PLAN:  I suggest surgical options for his large hiatal hernia  and  volvulus.  Will go ahead and allow clear liquids in the meantime.  Might  even want a PICC line and TPN per the surgeons, and we will discuss case  later today with them.  .           ______________________________  Petra Kuba, M.D.     MEM/MEDQ  D:  10/15/2006  T:  10/16/2006  Job:  [id number]   cc:   Georgina Quint. [name], MD  [address]  [place]  [place] [address]   Shirley Friar, MD  Fax: [phone]   Bertram Millard. Dahlstedt, M.D.  Fax: [phone]   [place] Daphine Deutscher, MD  [address], [address]  [place]  [place] [address]

## 2010-08-17 NOTE — Consult Note (Signed)
Jorge [name], Jorge [name]              ACCOUNT NO.:  [id number]   MEDICAL RECORD NO.:  [id number]          PATIENT TYPE:  INP   LOCATION:  1532                         FACILITY:  [facility]   PHYSICIAN:  Clovis Pu. Cornett, M.D.DATE OF BIRTH:  1929-08-06   DATE OF CONSULTATION:  10/13/2006  DATE OF DISCHARGE:                                 CONSULTATION   PHYSICIAN REQUESTING CONSULTATION:  Shirley Friar, MD.   REASON FOR CONSULTATION:  GI bleed and history of a complex  paraesophageal hernia with history of gastric volvulus.   HISTORY OF PRESENT ILLNESS:  The patient is a 77 year old male who was  admitted to GI service due to anemia.  He has a history of very complex  paraesophageal hernia that was documented back in 2007 and seen by Dr.  Wenda Low.  The patient was asymptomatic and at that point in time was  not to have surgery.  He was admitted last month for prostate cancer and  underwent a prostatectomy with Dr. Retta Diones and was discharged.  He has  had a one-month history of decreased p.o. intake.  He was admitted with  no real chest pain or abdominal pain but with anemia and weakness.  He  underwent endoscopy today, and there was some old blood noticed in his  stomach, but he had no gastric ischemia or obstruction with scope being  passed into the duodenum it looked like.  I was asked to see the patient  due to his history of GI bleeding.  Also I will see him due to his  history of complex large paraesophageal hernia.   The patient denies any significant abdominal pain, nausea, vomiting,  back or chest pain currently.  His eating had been good up until before  his prostatectomy, he states.  He had a good appetite and was not having  any significant dysphagia or chest pain with eating.  He denies any  obvious early satiety, and that has been more of an issue since his  prostatectomy.  Currently, he feels okay.   PAST MEDICAL HISTORY:  1. Prostate cancer.  2. Large complex  paraesophageal hernia.  3. History of gastric volvulus.   PAST SURGICAL HISTORY:  Prostatectomy.   FAMILY HISTORY:  Noncontributory.   MEDICATIONS:  Include Prevacid and stool softener.   ALLERGIES TO MEDICATIONS:  ASPIRIN, CEFUROXIME, IODINE, and  AZITHROMYCIN.   REVIEW OF SYSTEMS:  Negative x15 except for that stated above.   PHYSICAL EXAMINATION:  VITAL SIGNS:  Temperature is 100, heart rate 89,  blood pressure 120/76, respiratory rate 22.  GENERAL APPEARANCE:  Pleasant white male in no apparent distress.  HEENT:  Extraocular movements are intact.  Nasogastric tube is in place.  Mouth is dry.  NECK:  Supple, nontender, no mass.  CHEST:  Chest sounds are clear bilaterally.  Chest wall motion normal.  CARDIOVASCULAR:  Regular rate and rhythm without rub, murmur or gallop.  Extremities warm.  ABDOMEN:  Soft, nontender without rebound, guarding or distension.  No  mass or hernia noted.  EXTREMITIES:  Muscle tone normal. Range of motion normal.  NEUROLOGIC:  Glasgow coma scale 15.  Motor and sensory functions are  grossly intact.   DIAGNOSTIC STUDIES:  I reviewed his EGD report.   I was reviewed his upper GI studies from April 2007 and from this year  which showed a large complex paraesophageal hernia with organoaxial  rotation and a radiological diagnosis of gastric volvulus.   EGD was reviewed from today as well which showed old melanotic-appearing  blood as well as stomach which was viable without obstruction or  ischemia.   He has a white count 8200, hemoglobin 9.0, is now 9.2, but he is getting  blood currently, so I do not how much blood he has gotten, maybe a unit.  Hemoglobin dropped as low as 8.2.  Sodium 138, potassium 4.2, chloride  107, CO2 27, BUN 22, creatinine 0.8, glucose 117.   ASSESSMENT:  A large complex paraesophageal hernia with history of  gastric volvulus with organoaxial rotation.  1. History of prostate cancer.   PLAN:  He has no signs of pain  nor obstruction.  He does have GI  bleeding and source is unclear.  From the standpoint of paraesophageal  hernia, unless bleeding is demonstrated within the stomach, there is no  need for surgery this point since he has no ischemia, no obstruction,  and has been relatively asymptomatic and able to tolerate a diet.  A  true volvulus usually has ischemia and pain and/or obstruction which he  does not seem to have any.  Source of GI bleeding needs to be identified  prior to any surgical planning.  A tagged red blood cell scan may be  useful, and I doubt angiogram useful unless he has a much higher rate of  bleeding which he does not have.  Nasogastric tube aspirate appears to  be old blood and clear.  No active bleeding seems to be coming for his  stomach at this point in time.   I will follow along with you at this point.  Thank you for this  consultation.      Thomas A. Cornett, M.D.  Electronically Signed     TAC/MEDQ  D:  10/13/2006  T:  10/15/2006  Job:  [id number]   cc:   Shirley Friar, MD  Fax: [phone]

## 2010-08-17 NOTE — Op Note (Signed)
NAMEYAEL, ANGERER              ACCOUNT NO.:  [id number]   MEDICAL RECORD NO.:  [id number]          PATIENT TYPE:  INP   LOCATION:  1229                         FACILITY:  [facility]   PHYSICIAN:  Shirley Friar, MDDATE OF BIRTH:  January 05, 1930   DATE OF PROCEDURE:  09/23/2006  DATE OF DISCHARGE:                               OPERATIVE REPORT   INDICATIONS:  GI bleed.   MEDICATIONS:  Fentanyl 50 mcg IV, Versed 5 mg IV.   HISTORY:  A 77 year old white male with known history of a large hiatal  hernia, thought to be due to partial rotation based on upper endoscopy  in 2007.  Mr. Ratz underwent a radical prostatectomy on September 18, 2006  and required 4 units of packed red blood cells intraoperatively.  But,  postprocedure he developed shortness of breath, tachycardia and  abdominal distension.  An NG drain was placed and he aspirated a  significant amount of dark bloody drainage.  His hemoglobin has drifted  down from 13.8 to 8.5.  A chest x-ray was done, which showed a markedly  distended stomach that appeared to be intrathoracic and horizontal in  orientation.  He is undergoing upper endoscopy to further evaluate  possible GI bleed.   MEDICATIONS:  Fentanyl 50 mcg IV, Versed 5 mg IV.   FINDINGS:  Endoscope was inserted through oropharynx and the esophagus  was intubated.  Due to the severe retching in the presence of his NG  tube, the endoscope could not be safely advanced into the stomach until  removal of the NG tube.  After removal of the NG tube, there was  moderate erythema and ulceration in the distal esophagus at the GE  junction -- consistent with moderate erosive esophagitis with some  oozing of blood.  The endoscope was advanced into the stomach, where a  large amount of dark black-colored fluid was present in the body and  fundus that could not be aspirated, and prevented adequate visualization  of that part of the stomach.  The stomach was distended and despite  loop  reduction and suctioning of air, the endoscope could not be advanced  down to the pyloric channel.  Retroflexion was done, which revealed dark-  colored fluid in the cardia, obscuring visualization of the proximal  stomach.   ASSESSMENT:  1. Large hiatal hernia, consistent with intrathoracic stomach      appearance on chest x-ray, preventing intubation of the duodenum      and pyloric channel.  2. Large amount of old blood in fundus and body, preventing adequate      visualization of the stomach.  3. Moderate erosive esophagitis with oozing of blood -- unclear if      this is source of his GI bleed, but no arterial bleeding seen.   PLAN:  1. Replace the NG tube to low-wall suction.  2. Intravenous PPI.  3. May need repeat upper endoscopy in 48-72 hours if hemoglobin      continues to fall.  4. May need surgical consult if concern for gastric outlet obstruction      occurs.  May also  need to consider abdominal CT scan to better      evaluate his intrathoracic stomach versus upper GI series; but,      will await further stabilization of the GI bleed.      Shirley Friar, MD  Electronically Signed     VCS/MEDQ  D:  09/23/2006  T:  09/24/2006  Job:  [id number]   cc:   Bernette Redbird, M.D.  Fax: [phone]

## 2010-08-20 NOTE — Discharge Summary (Signed)
[name], [name]              ACCOUNT NO.:  [id number]   MEDICAL RECORD NO.:  [id number]          PATIENT TYPE:  INP   LOCATION:  1532                         FACILITY:  [facility]   PHYSICIAN:  Shirley Friar, MDDATE OF BIRTH:  1929-04-26   DATE OF ADMISSION:  10/12/2006  DATE OF DISCHARGE:  10/16/2006                               DISCHARGE SUMMARY   DISCHARGE DIAGNOSES:  1. Gastrointestinal bleeding.  2. Organoaxial volvulus.  3. Large hiatal hernia.  4. Peptic ulcer disease   HOSPITAL COURSE:  Mr.  [name] was admitted on October 12, 2006, secondary  to complaint of black-colored stools persistently since he left the  hospital in June.  In the office, he was dizzy, lightheaded and  orthostatic and was admitted to the hospital for further evaluation.  He  underwent an upper endoscopy July 11 which showed a large amount of old  blood in the stomach. Then enteroscopy was done without passage through  the volvulus, and no intraluminal ischemia was seen.  He was medically  managed for his GI bleed, and then had a repeat upper endoscopy done on  July 13 to reassess his stomach, and there was the thought that he may  have an ulcer at the distal end of a twist in the stomach.  No active  bleeding was seen.  A surgical consult was obtained during his  hospitalization, and it was recommended to consider outpatient surgery  possibly by Dr. Wenda Low.  He had been previously worked up for this  by Dr. Daphine Deutscher in the past. On October 16, 2006, he was discharged home in  improved condition.   DISCHARGE ACTIVITY:  Increase activity slowly.   DISPOSITION:  The patient was to follow up with Dr. Daphine Deutscher in July for  further consideration of surgery.      Shirley Friar, MD  Electronically Signed     VCS/MEDQ  D:  11/18/2006  T:  11/19/2006  Job:  [id number]   cc:   [place] Daphine Deutscher, MD  [address]  [place]  [place] [address]   Georgina Quint. [name], MD  [address]  [place]  [place] [address]

## 2010-10-19 ENCOUNTER — Other Ambulatory Visit: Payer: Self-pay | Admitting: Internal Medicine

## 2010-11-10 ENCOUNTER — Encounter: Payer: Self-pay | Admitting: Internal Medicine

## 2010-11-10 ENCOUNTER — Ambulatory Visit (INDEPENDENT_AMBULATORY_CARE_PROVIDER_SITE_OTHER): Payer: Medicare Other | Admitting: Internal Medicine

## 2010-11-10 DIAGNOSIS — I10 Essential (primary) hypertension: Secondary | ICD-10-CM

## 2010-11-10 DIAGNOSIS — D485 Neoplasm of uncertain behavior of skin: Secondary | ICD-10-CM | POA: Insufficient documentation

## 2010-11-10 DIAGNOSIS — E559 Vitamin D deficiency, unspecified: Secondary | ICD-10-CM

## 2010-11-10 DIAGNOSIS — F411 Generalized anxiety disorder: Secondary | ICD-10-CM

## 2010-11-10 DIAGNOSIS — R197 Diarrhea, unspecified: Secondary | ICD-10-CM

## 2010-11-10 NOTE — Patient Instructions (Signed)
Postprocedure instructions :    A Band-Aid should be  changed twice daily. You can take a shower tomorrow.  Keep the wounds clean. You can wash them with liquid soap and water. Pat dry with gauze or a Kleenex tissue  Before applying antibiotic ointment and a Band-Aid.   You need to report immediately  if fever, chills or any signs of infection develop.    The biopsy results should be available in 1 -2 weeks. 

## 2010-11-10 NOTE — Assessment & Plan Note (Signed)
On Rx 

## 2010-11-10 NOTE — Assessment & Plan Note (Signed)
Cont Rx 

## 2010-11-10 NOTE — Progress Notes (Signed)
  Subjective:    Patient ID: Jorge [name], male    DOB: 12/07/1929, 77 y.o.   MRN: [id number]  HPI  The patient presents for a follow-up of  chronic HH, chronic dyslipidemia, OA; diarrhea is controlled with metronidazole qd C/o droopy eyelids C/o growth on L ear    Review of Systems  Constitutional: Negative for appetite change, fatigue and unexpected weight change.  HENT: Negative for nosebleeds, congestion, sore throat, sneezing, trouble swallowing and neck pain.   Eyes: Negative for itching and visual disturbance.  Respiratory: Negative for cough.   Cardiovascular: Negative for chest pain, palpitations and leg swelling.  Gastrointestinal: Positive for abdominal pain (sometimes) and abdominal distention. Negative for nausea, diarrhea and blood in stool.  Genitourinary: Negative for frequency and hematuria.  Musculoskeletal: Negative for back pain, joint swelling and gait problem.  Skin: Negative for rash.  Neurological: Negative for dizziness, tremors, speech difficulty and weakness.  Psychiatric/Behavioral: Negative for sleep disturbance, dysphoric mood and agitation. The patient is not nervous/anxious.        Objective:   Physical Exam  Constitutional: He is oriented to person, place, and time. He appears well-developed.  HENT:  Mouth/Throat: Oropharynx is clear and moist.  Eyes: Conjunctivae are normal. Pupils are equal, round, and reactive to light.  Neck: Normal range of motion. No JVD present. No thyromegaly present.  Cardiovascular: Normal rate, regular rhythm, normal heart sounds and intact distal pulses.  Exam reveals no gallop and no friction rub.   No murmur heard. Pulmonary/Chest: Effort normal and breath sounds normal. No respiratory distress. He has no wheezes. He has no rales. He exhibits no tenderness.  Abdominal: Soft. Bowel sounds are normal. He exhibits no distension and no mass. There is no tenderness. There is no rebound and no guarding.    Musculoskeletal: Normal range of motion. He exhibits no edema and no tenderness.  Lymphadenopathy:    He has no cervical adenopathy.  Neurological: He is alert and oriented to person, place, and time. He has normal reflexes. No cranial nerve deficit. He exhibits normal muscle tone. Coordination normal.  Skin: Skin is warm and dry. No rash noted.       Corn like growth on L ear  Psychiatric: He has a normal mood and affect. His behavior is normal. Judgment and thought content normal.          Assessment & Plan:

## 2010-11-10 NOTE — Assessment & Plan Note (Addendum)
Procedure Note :     Procedure :  Skin biopsy   Indication:    Suspicious lesion(s)   Risks including unsuccessful procedure , bleeding, infection, bruising, scar, a need for another complete procedure and others were explained to the patient in detail as well as the benefits. Informed consent was obtained and signed.   The patient was placed in a decubitus position.  Lesion #1 on   L ear  measuring   4x3 mm   Skin over lesion #1  was prepped with Betadine and alcohol  and anesthetized with 0.3 cc of 2% lidocaine and epinephrine, using a 25-gauge 1 inch needle.  Shave biopsy with a sterile Dermablade was carried out in the usual fashion.   Hyfrecator was used to destroy the rest of the lesion potentially left behind and for hemostasis. Band-Aid was applied with antibiotic ointment.          Tolerated well. Complications none.      Postprocedure instructions :    A Band-Aid should be  changed twice daily. You can take a shower tomorrow.  Keep the wounds clean. You can wash them with liquid soap and water. Pat dry with gauze or a Kleenex tissue  Before applying antibiotic ointment and a Band-Aid.   You need to report immediately  if fever, chills or any signs of infection develop.    The biopsy results should be available in 1 -2 weeks.

## 2010-11-14 ENCOUNTER — Telehealth: Payer: Self-pay | Admitting: Internal Medicine

## 2010-11-14 DIAGNOSIS — C4490 Unspecified malignant neoplasm of skin, unspecified: Secondary | ICD-10-CM

## 2010-11-14 NOTE — Telephone Encounter (Signed)
Jorge [name], please, inform patient that skin bx showed early skin ca. Schedule appt w/skin surgeon (done) Thx

## 2010-11-15 NOTE — Telephone Encounter (Signed)
Pt informed

## 2011-01-11 ENCOUNTER — Ambulatory Visit (INDEPENDENT_AMBULATORY_CARE_PROVIDER_SITE_OTHER): Payer: Medicare Other

## 2011-01-11 DIAGNOSIS — Z23 Encounter for immunization: Secondary | ICD-10-CM

## 2011-01-17 LAB — BASIC METABOLIC PANEL
CO2: 28
Calcium: 9.1
Glucose, Bld: 127 — ABNORMAL HIGH
Potassium: 4.2
Sodium: 142

## 2011-01-18 LAB — BASIC METABOLIC PANEL
BUN: 12
BUN: 14
BUN: 8
Calcium: 7.9 — ABNORMAL LOW
Calcium: 8.1 — ABNORMAL LOW
Calcium: 8.1 — ABNORMAL LOW
Chloride: 108
Creatinine, Ser: 0.86
GFR calc Af Amer: >60
GFR calc non Af Amer: >60
GFR calc non Af Amer: >60
GFR calc non Af Amer: >60
Glucose, Bld: 113 — ABNORMAL HIGH
Potassium: 3.7
Potassium: 4.2
Potassium: 4.2
Sodium: 139

## 2011-01-18 LAB — CROSSMATCH
ABO/RH(D): A POS
Antibody Screen: NEGATIVE

## 2011-01-18 LAB — COMPREHENSIVE METABOLIC PANEL
ALT: 22
Albumin: 3.1 — ABNORMAL LOW
Alkaline Phosphatase: 73
BUN: 24 — ABNORMAL HIGH
Chloride: 108
Glucose, Bld: 127 — ABNORMAL HIGH
Potassium: 4.3
Total Bilirubin: 0.5

## 2011-01-18 LAB — HEMOGLOBIN AND HEMATOCRIT, BLOOD
HCT: 33.5 — ABNORMAL LOW
HCT: 34.2 — ABNORMAL LOW
HCT: 34.4 — ABNORMAL LOW
HCT: 34.5 — ABNORMAL LOW
HCT: 34.9 — ABNORMAL LOW
Hemoglobin: 10.7 — ABNORMAL LOW
Hemoglobin: 10.9 — ABNORMAL LOW
Hemoglobin: 11.2 — ABNORMAL LOW
Hemoglobin: 11.4 — ABNORMAL LOW
Hemoglobin: 11.6 — ABNORMAL LOW

## 2011-01-18 LAB — CBC
HCT: 26.3 — ABNORMAL LOW
HCT: 27.8 — ABNORMAL LOW
HCT: 32.3 — ABNORMAL LOW
HCT: 33.6 — ABNORMAL LOW
Hemoglobin: 11.6 — ABNORMAL LOW
Hemoglobin: 9 — ABNORMAL LOW
Hemoglobin: 9.4 — ABNORMAL LOW
MCV: 88.7
MCV: 92.8
Platelets: 173
Platelets: 187
Platelets: 188
RBC: 2.96 — ABNORMAL LOW
RBC: 2.99 — ABNORMAL LOW
RDW: 14.9 — ABNORMAL HIGH
RDW: 15.8 — ABNORMAL HIGH
WBC: 10.1
WBC: 7.1
WBC: 7.4
WBC: 8.2
WBC: 9.4

## 2011-01-18 LAB — DIFFERENTIAL
Eosinophils Absolute: 0
Eosinophils Relative: 0
Lymphs Abs: 1.1
Monocytes Absolute: 0.7
Monocytes Relative: 7

## 2011-01-19 LAB — CULTURE, BLOOD (ROUTINE X 2): Culture: NO GROWTH

## 2011-01-19 LAB — BASIC METABOLIC PANEL
BUN: 10
BUN: 28 — ABNORMAL HIGH
BUN: 8
BUN: 9
CO2: 24
CO2: 26
CO2: 27
Calcium: 7.5 — ABNORMAL LOW
Calcium: 7.6 — ABNORMAL LOW
Calcium: 7.8 — ABNORMAL LOW
Chloride: 107
Chloride: 108
Chloride: 108
Creatinine, Ser: 0.88
Creatinine, Ser: 0.88
Creatinine, Ser: 0.97
Creatinine, Ser: 1.05
Creatinine, Ser: 1.2
GFR calc Af Amer: >60
GFR calc Af Amer: >60
GFR calc Af Amer: >60
GFR calc non Af Amer: >60
GFR calc non Af Amer: >60
GFR calc non Af Amer: >60
Glucose, Bld: 145 — ABNORMAL HIGH
Glucose, Bld: 152 — ABNORMAL HIGH
Potassium: 4.4

## 2011-01-19 LAB — CBC
HCT: 24.8 — ABNORMAL LOW
HCT: 25.6 — ABNORMAL LOW
HCT: 28.5 — ABNORMAL LOW
HCT: 31.3 — ABNORMAL LOW
HCT: 35.5 — ABNORMAL LOW
HCT: 37.9 — ABNORMAL LOW
HCT: 40.5
Hemoglobin: 10.7 — ABNORMAL LOW
Hemoglobin: 12.1 — ABNORMAL LOW
Hemoglobin: 8.4 — ABNORMAL LOW
Hemoglobin: 8.5 — ABNORMAL LOW
Hemoglobin: 9.8 — ABNORMAL LOW
MCHC: 33.4
MCHC: 33.7
MCHC: 34
MCHC: 34
MCV: 91.7
MCV: 91.8
MCV: 93.1
MCV: 93.2
MCV: 93.7
MCV: 93.8
MCV: 94
MCV: 94
Platelets: 143 — ABNORMAL LOW
Platelets: 150
Platelets: 229
Platelets: 233
Platelets: 235
Platelets: 285
RBC: 2.52 — ABNORMAL LOW
RBC: 2.7 — ABNORMAL LOW
RBC: 2.83 — ABNORMAL LOW
RBC: 3.05 — ABNORMAL LOW
RBC: 3.36 — ABNORMAL LOW
RBC: 4.18 — ABNORMAL LOW
RBC: 5.34
RDW: 14.3 — ABNORMAL HIGH
RDW: 14.5 — ABNORMAL HIGH
RDW: 14.7 — ABNORMAL HIGH
RDW: 15.2 — ABNORMAL HIGH
WBC: 11.9 — ABNORMAL HIGH
WBC: 13.1 — ABNORMAL HIGH
WBC: 13.6 — ABNORMAL HIGH
WBC: 13.9 — ABNORMAL HIGH
WBC: 13.9 — ABNORMAL HIGH
WBC: 18.1 — ABNORMAL HIGH
WBC: 20.4 — ABNORMAL HIGH
WBC: 8.2

## 2011-01-19 LAB — BLOOD GAS, ARTERIAL
Bicarbonate: 18.3 — ABNORMAL LOW
TCO2: 17.6
pCO2 arterial: 33.3 — ABNORMAL LOW
pH, Arterial: 7.355

## 2011-01-19 LAB — CROSSMATCH
ABO/RH(D): A POS
Antibody Screen: NEGATIVE

## 2011-01-19 LAB — TYPE AND SCREEN: ABO/RH(D): A POS

## 2011-01-19 LAB — POCT I-STAT EG7
Calcium, Ion: 1.11 — ABNORMAL LOW
HCT: 26 — ABNORMAL LOW
Hemoglobin: 8.8 — ABNORMAL LOW
Patient temperature: 35.2
Potassium: 4.2
pCO2, Ven: 37.9 — ABNORMAL LOW
pH, Ven: 7.356 — ABNORMAL HIGH
pO2, Ven: 168 — ABNORMAL HIGH

## 2011-01-19 LAB — URINE CULTURE
Colony Count: NO GROWTH
Culture: NO GROWTH
Special Requests: NEGATIVE

## 2011-01-19 LAB — CARDIAC PANEL(CRET KIN+CKTOT+MB+TROPI)
Relative Index: INVALID
Total CK: 104
Total CK: 59
Total CK: 96
Troponin I: 0.04
Troponin I: 0.07 — ABNORMAL HIGH

## 2011-01-19 LAB — POCT I-STAT 4, (NA,K, GLUC, HGB,HCT)
HCT: 28 — ABNORMAL LOW
Hemoglobin: 9.5 — ABNORMAL LOW

## 2011-01-19 LAB — PSA: PSA: 8.22 — ABNORMAL HIGH

## 2011-01-20 LAB — URINALYSIS, ROUTINE W REFLEX MICROSCOPIC
Bilirubin Urine: NEGATIVE
Glucose, UA: NEGATIVE
Ketones, ur: NEGATIVE
Nitrite: NEGATIVE
Protein, ur: NEGATIVE

## 2011-01-20 LAB — BASIC METABOLIC PANEL
BUN: 12
Chloride: 106
Creatinine, Ser: 1.13
Glucose, Bld: 117 — ABNORMAL HIGH

## 2011-02-14 ENCOUNTER — Ambulatory Visit (INDEPENDENT_AMBULATORY_CARE_PROVIDER_SITE_OTHER): Payer: Medicare Other | Admitting: Internal Medicine

## 2011-02-14 ENCOUNTER — Encounter: Payer: Self-pay | Admitting: Internal Medicine

## 2011-02-14 DIAGNOSIS — F411 Generalized anxiety disorder: Secondary | ICD-10-CM

## 2011-02-14 DIAGNOSIS — G4700 Insomnia, unspecified: Secondary | ICD-10-CM

## 2011-02-14 DIAGNOSIS — N400 Enlarged prostate without lower urinary tract symptoms: Secondary | ICD-10-CM

## 2011-02-14 DIAGNOSIS — I10 Essential (primary) hypertension: Secondary | ICD-10-CM

## 2011-02-14 MED ORDER — ZOLPIDEM TARTRATE 10 MG PO TABS: 10.0000 mg | ORAL_TABLET | Freq: Every evening | ORAL | Status: DC | PRN

## 2011-02-14 MED ORDER — TERAZOSIN HCL 2 MG PO CAPS: 2.0000 mg | ORAL_CAPSULE | Freq: Every day | ORAL | Status: DC

## 2011-02-14 NOTE — Progress Notes (Signed)
  Subjective:    Patient ID: Jorge [name], male    DOB: 02-21-1930, 77 y.o.   MRN: [id number]  HPI  The patient presents for a follow-up of  chronic hypertension, chronic GERD/HH, gas controlled with medicines. C/o insomnia C/o a stomach flu this past wknd - loose stools    Review of Systems  Constitutional: Negative for appetite change, fatigue and unexpected weight change.  HENT: Negative for nosebleeds, congestion, sore throat, sneezing, trouble swallowing and neck pain.   Eyes: Negative for itching and visual disturbance.  Respiratory: Negative for cough.   Cardiovascular: Negative for chest pain, palpitations and leg swelling.  Gastrointestinal: Negative for nausea, diarrhea, blood in stool and abdominal distention.  Genitourinary: Negative for frequency and hematuria.  Musculoskeletal: Negative for back pain, joint swelling and gait problem.  Skin: Negative for rash.  Neurological: Negative for dizziness, tremors, speech difficulty and weakness.  Psychiatric/Behavioral: Negative for sleep disturbance, dysphoric mood and agitation. The patient is not nervous/anxious.    Wt Readings from Last 3 Encounters:  02/14/11 202 lb (91.627 kg)  11/10/10 200 lb (90.719 kg)  07/19/10 200 lb (90.719 kg)       Objective:   Physical Exam  Constitutional: He is oriented to person, place, and time. He appears well-developed.  HENT:  Mouth/Throat: Oropharynx is clear and moist.  Eyes: Conjunctivae are normal. Pupils are equal, round, and reactive to light.  Neck: Normal range of motion. No JVD present. No thyromegaly present.  Cardiovascular: Normal rate, regular rhythm, normal heart sounds and intact distal pulses.  Exam reveals no gallop and no friction rub.   No murmur heard. Pulmonary/Chest: Effort normal and breath sounds normal. No respiratory distress. He has no wheezes. He has no rales. He exhibits no tenderness.  Abdominal: Soft. Bowel sounds are normal. He exhibits no  distension and no mass. There is no tenderness. There is no rebound and no guarding.  Musculoskeletal: Normal range of motion. He exhibits no edema and no tenderness.  Lymphadenopathy:    He has no cervical adenopathy.  Neurological: He is alert and oriented to person, place, and time. He has normal reflexes. No cranial nerve deficit. He exhibits normal muscle tone. Coordination normal.  Skin: Skin is warm and dry. No rash noted.  Psychiatric: He has a normal mood and affect. His behavior is normal. Judgment and thought content normal.          Assessment & Plan:

## 2011-02-14 NOTE — Assessment & Plan Note (Signed)
Continue with current prescription therapy as reflected on the Med list.  

## 2011-02-14 NOTE — Assessment & Plan Note (Signed)
Start Zolpidem. Other options discussed too

## 2011-03-15 ENCOUNTER — Ambulatory Visit (INDEPENDENT_AMBULATORY_CARE_PROVIDER_SITE_OTHER): Payer: Medicare Other | Admitting: Internal Medicine

## 2011-03-15 ENCOUNTER — Encounter: Payer: Self-pay | Admitting: Internal Medicine

## 2011-03-15 VITALS — BP 120/78 | HR 73 | Temp 97.2°F | Wt 203.4 lb

## 2011-03-15 DIAGNOSIS — K5732 Diverticulitis of large intestine without perforation or abscess without bleeding: Secondary | ICD-10-CM

## 2011-03-15 DIAGNOSIS — R1032 Left lower quadrant pain: Secondary | ICD-10-CM

## 2011-03-15 MED ORDER — SIMETHICONE 125 MG PO CAPS: 125.0000 mg | ORAL_CAPSULE | Freq: Two times a day (BID) | ORAL | Status: DC | PRN

## 2011-03-15 NOTE — Patient Instructions (Signed)
It was good to see you today. Take Flagyl antibiotics 3 times a day for one week and use over-the-counter Gas-X (generic is simethicone) twice daily as needed for bloating and gas If your abdominal pain becomes worse, or if there are any symptoms such as fever, nausea vomiting or inability to take oral medications, call as needed

## 2011-03-15 NOTE — Progress Notes (Signed)
  Subjective:    Patient ID: Jorge [name], male    DOB: Jul 10, 1929, 77 y.o.   MRN: [id number]  Abdominal Pain This is a new problem. The current episode started in the past 7 days. The onset quality is sudden. The problem occurs every several days. The problem has been waxing and waning. The pain is located in the LLQ. The pain is mild. The quality of the pain is aching and a sensation of fullness. The abdominal pain does not radiate. Associated symptoms include belching. Pertinent negatives include no anorexia, constipation, diarrhea, dysuria, fever, flatus, hematuria, nausea, vomiting or weight loss. The pain is aggravated by nothing. The pain is relieved by belching and bowel movements. He has tried nothing for the symptoms. Prior workup: 2008 CT scan reviewed>>HH. His past medical history is significant for PUD (remote).    Past Medical History  Diagnosis Date  . Anxiety   . GERD (gastroesophageal reflux disease)   . Hypertension   . Gastric ulcer     Dr. Christella Hartigan  . ED (erectile dysfunction)   . History of colonic polyps   . Prostate cancer 2010    Dr. Lenn Sink  . Vitamin D deficiency   . Insomnia   . Hernia, hiatal   . Syncope 2011    from Bicalutamide    Review of Systems  Constitutional: Negative for fever and weight loss.  Gastrointestinal: Positive for abdominal pain. Negative for nausea, vomiting, diarrhea, constipation, anorexia and flatus.  Genitourinary: Negative for dysuria and hematuria.       Objective:   Physical Exam BP 120/78  Pulse 73  Temp(Src) 97.2 F (36.2 C) (Oral)  Wt 203 lb 6.4 oz (92.262 kg)  SpO2 95% Wt Readings from Last 3 Encounters:  03/15/11 203 lb 6.4 oz (92.262 kg)  02/14/11 202 lb (91.627 kg)  11/10/10 200 lb (90.719 kg)   Constitutional:  He appears well-developed and well-nourished. No distress.  Neck: Normal range of motion. Neck supple. No JVD present. No thyromegaly present.  Cardiovascular: Normal rate, regular rhythm and  normal heart sounds.  No murmur heard. no BLE edema Pulmonary/Chest: Effort normal and breath sounds normal. No respiratory distress. no wheezes.  Abdominal: Soft. Bowel sounds are normal. Patient exhibits no distension. There is mild LLQ tenderness, no rebound.  thought content normal.   Lab Results  Component Value Date   WBC 7.3 01/18/2010   HGB 16.1 01/18/2010   HCT 47.1 01/18/2010   PLT 159.0 01/18/2010   GLUCOSE 107* 01/18/2010   CHOL 167 01/10/2007   TRIG 89 01/10/2007   HDL 47.7 01/10/2007   LDLCALC 102* 01/10/2007   ALT 19 01/18/2010   AST 23 01/18/2010   NA 139 01/18/2010   K 5.0 01/18/2010   CL 110 01/18/2010   CREATININE 1.0 01/18/2010   BUN 18 01/18/2010   CO2 22 01/18/2010   TSH 1.41 11/19/2009   PSA 0.46 01/10/2007   INR 1.0 10/12/2006   HGBA1C 6.1* 01/10/2007       Assessment & Plan:  abdominal pain, LLQ - possible early diverticulitis - > empiric Flagyl x 1 week - also recommended OTC simethicone for gas; call if unimproved or worse

## 2011-04-22 ENCOUNTER — Ambulatory Visit (INDEPENDENT_AMBULATORY_CARE_PROVIDER_SITE_OTHER): Payer: Medicare Other | Admitting: Internal Medicine

## 2011-04-22 ENCOUNTER — Encounter: Payer: Self-pay | Admitting: Internal Medicine

## 2011-04-22 ENCOUNTER — Ambulatory Visit (INDEPENDENT_AMBULATORY_CARE_PROVIDER_SITE_OTHER)
Admission: RE | Admit: 2011-04-22 | Discharge: 2011-04-22 | Disposition: A | Discharge status: Home / Self Care | Payer: Medicare Other | Source: Ambulatory Visit | Attending: Internal Medicine | Admitting: Internal Medicine

## 2011-04-22 ENCOUNTER — Other Ambulatory Visit (INDEPENDENT_AMBULATORY_CARE_PROVIDER_SITE_OTHER): Payer: Medicare Other

## 2011-04-22 VITALS — BP 138/82 | HR 91 | Temp 97.1°F | Resp 16 | Wt 199.0 lb

## 2011-04-22 DIAGNOSIS — R1012 Left upper quadrant pain: Secondary | ICD-10-CM

## 2011-04-22 DIAGNOSIS — K8020 Calculus of gallbladder without cholecystitis without obstruction: Secondary | ICD-10-CM | POA: Diagnosis not present

## 2011-04-22 DIAGNOSIS — K5900 Constipation, unspecified: Secondary | ICD-10-CM

## 2011-04-22 DIAGNOSIS — R0789 Other chest pain: Secondary | ICD-10-CM

## 2011-04-22 DIAGNOSIS — K440 Diaphragmatic hernia with obstruction, without gangrene: Secondary | ICD-10-CM | POA: Diagnosis not present

## 2011-04-22 DIAGNOSIS — R141 Gas pain: Secondary | ICD-10-CM | POA: Diagnosis not present

## 2011-04-22 LAB — CBC WITH DIFFERENTIAL/PLATELET
Basophils Relative: 0.4 % (ref 0.0–3.0)
Eosinophils Relative: 1.1 % (ref 0.0–5.0)
HCT: 46.2 % (ref 39.0–52.0)
Hemoglobin: 15.7 g/dL (ref 13.0–17.0)
Lymphs Abs: 1 10*3/uL (ref 0.7–4.0)
Monocytes Relative: 8.4 % (ref 3.0–12.0)
Neutro Abs: 5.1 10*3/uL (ref 1.4–7.7)
RBC: 4.79 Mil/uL (ref 4.22–5.81)
WBC: 6.8 10*3/uL (ref 4.5–10.5)

## 2011-04-22 LAB — BASIC METABOLIC PANEL
BUN: 10 mg/dL (ref 6–23)
CO2: 30 mEq/L (ref 19–32)
Chloride: 102 mEq/L (ref 96–112)
Creatinine, Ser: 0.9 mg/dL (ref 0.4–1.5)
Potassium: 4.2 mEq/L (ref 3.5–5.1)

## 2011-04-22 LAB — SEDIMENTATION RATE: Sed Rate: 10 mm/hr (ref 0–22)

## 2011-04-22 LAB — HEPATIC FUNCTION PANEL
ALT: 14 U/L (ref 0–53)
Albumin: 3.9 g/dL (ref 3.5–5.2)
Bilirubin, Direct: 0.1 mg/dL (ref 0.0–0.3)
Total Protein: 6.4 g/dL (ref 6.0–8.3)

## 2011-04-22 LAB — CARDIAC PANEL: Total CK: 37 U/L (ref 7–232)

## 2011-04-22 MED ORDER — PHOSPHATE ENEMA 7-19 GM/118ML RE ENEM: 1.0000 | ENEMA | Freq: Once | RECTAL | Status: DC | PRN

## 2011-04-22 MED ORDER — PHOSPHATE ENEMA 7-19 GM/118ML RE ENEM: 1.0000 | ENEMA | Freq: Once | RECTAL | Status: AC | PRN

## 2011-04-22 MED ORDER — OXYCODONE HCL 10 MG PO TABS: 10.0000 mg | ORAL_TABLET | Freq: Four times a day (QID) | ORAL | Status: DC | PRN

## 2011-04-22 NOTE — Assessment & Plan Note (Signed)
1/13 L sided - poss diaphragmatic sliding hernia and valvulus that resolved or partially  Labs CT chest NPO prior to CT except for the contrast

## 2011-04-22 NOTE — Assessment & Plan Note (Signed)
1/13 acute, severe R/o volvulus and other Plan as above: labs, CT abd/pelvis

## 2011-04-22 NOTE — Patient Instructions (Signed)
Go to ER if pain reoccurs

## 2011-04-22 NOTE — Progress Notes (Signed)
  Subjective:    Patient ID: Jorge [name], male    DOB: 06/22/1929, 78 y.o.   MRN: [id number]  HPI C/o severe (10/10) LUQ abd pain at 6 am - very severe x 45 min. It was irrad to L shoulder. He had L CP too. Now pain is 3/10. No n/v. He has been constipated and bloating x 7 d. He had a small stool last night w/a lot of gas... No R CP. He took Tramadol and Flagyl this am - it helped   Review of Systems  Constitutional: Negative for fever, chills, diaphoresis, appetite change, fatigue and unexpected weight change.  HENT: Negative for nosebleeds, congestion, sore throat, sneezing, trouble swallowing and neck pain.   Eyes: Negative for itching and visual disturbance.  Respiratory: Negative for cough, choking, chest tightness and wheezing.   Cardiovascular: Positive for chest pain. Negative for palpitations and leg swelling.  Gastrointestinal: Positive for abdominal pain, constipation and abdominal distention. Negative for nausea, vomiting, diarrhea, blood in stool, anal bleeding and rectal pain.  Genitourinary: Negative for frequency and hematuria.  Musculoskeletal: Negative for back pain, joint swelling and gait problem.  Skin: Negative for rash.  Neurological: Negative for dizziness, tremors, speech difficulty and weakness.  Psychiatric/Behavioral: Negative for sleep disturbance, dysphoric mood and agitation. The patient is not nervous/anxious.        Objective:   Physical Exam  Constitutional: He is oriented to person, place, and time. He appears well-developed. No distress.       NAD Looks tired  HENT:  Mouth/Throat: Oropharynx is clear and moist.  Eyes: Conjunctivae are normal. Pupils are equal, round, and reactive to light.  Neck: Normal range of motion. No JVD present. No thyromegaly present.  Cardiovascular: Normal rate, regular rhythm, normal heart sounds and intact distal pulses.  Exam reveals no gallop and no friction rub.   No murmur heard. Pulmonary/Chest: Effort normal  and breath sounds normal. No respiratory distress. He has no wheezes. He has no rales. He exhibits no tenderness.  Abdominal: Soft. Bowel sounds are normal. He exhibits no distension and no mass. There is tenderness (LUQ is sensitive to palp). There is no rebound and no guarding.  Musculoskeletal: Normal range of motion. He exhibits no edema and no tenderness.  Lymphadenopathy:    He has no cervical adenopathy.  Neurological: He is alert and oriented to person, place, and time. He has normal reflexes. No cranial nerve deficit. He exhibits normal muscle tone. Coordination normal.  Skin: Skin is warm and dry. No rash noted. He is not diaphoretic.  Psychiatric: He has a normal mood and affect. His behavior is normal. Judgment and thought content normal.     Procedure Note :    Procedure :   Point of care (POC) sonography examination: chest limited and abd - limited   Indication: LUQ abd pain, L CP  Equipment used: Sonosite M-Turbo with P21x/5-1 MHz transducer phased array probe. The images were stored in the unit and later transferred in storage.  The patient was placed in a decubitus position.  Chest US revealed cardiomegaly and a small pericardial effusion.  Abd US revealed normal aorta; no liver masses; contracted GB with stones; normal spleen and kidneys     Impression: Cardiomegaly. Small pericardial effusion. Gallstones. No AAA.         Assessment & Plan:   A complicated situation. He is better clinically.  Clear liquids if CT ok. Advance diet as tol.

## 2011-04-22 NOTE — Assessment & Plan Note (Addendum)
Chronic and very large. He was eval by Dr Daphine Deutscher: surgery was thought to be too risky at the time  CT 04/22/11: *RADIOLOGY REPORT*  Clinical Data: Left upper quadrant abdominal pain. Diaphragmatic  hernia. Question volvulus. Constipation. Chest pain. History  prostate cancer.  CT CHEST, ABDOMEN AND PELVIS WITHOUT CONTRAST  Technique: Contiguous axial images of the chest abdomen and pelvis  were obtained without IV contrast administration.  Comparison: 09/25/2006.  CT CHEST  Findings: Lung windows demonstrate similar volume loss at the lung  bases, secondary to the hernia to be described below.  Soft tissue windows demonstrate mild cardiomegaly with trace  similar pericardial effusion. No pleural fluid.  No mediastinal adenopathy. Limited evaluation for hilar  adenopathy, given extent of volume loss within the chest and lack  of IV contrast. The proximal esophagus demonstrates an air-fluid  level within on image 18.  Similar sebaceous cyst superficial the spinous processes of the  lower thoracic spine.  IMPRESSION:  1. Diaphragmatic hernia, which will be detailed below.  2. Otherwise, no acute process in the chest.  3. Cardiomegaly with similar small pericardial effusion.  4. Bibasilar volume loss, secondary to hernia.  5. Esophageal air fluid level suggests dysmotility or  gastroesophageal reflux.  CT ABDOMEN AND PELVIS  Findings: Normal liver and spleen. A large hiatal hernia is again  identified. This contains the stomach, as well as portions of the  transverse colon and pancreas. This again displaces the heart  anteriorly. Organoaxial volvulus, with a similar distribution back  to 2008.  There is no obstruction, with contrast in the small bowel. No wall  thickening or pneumatosis/free intraperitoneal air.  The pancreas is otherwise normal in appearance. Gallstones without  acute cholecystitis. Normal adrenal glands. Mild renal cortical  thinning bilaterally. No urinary tract  calculi or hydronephrosis.  No retroperitoneal or retrocrural adenopathy.  No evidence of colonic obstruction. Normal caliber of the terminal  ileum. Small bowel loops enter the diaphragmatic the hernia,  similarly to on the prior, and are also nonobstructed.  There also right greater than left bilateral inguinal hernias.  Both contain nonobstructive small bowel at their entrance. There  is minimal motion artifact within the mid abdomen. No ascites. No  pelvic adenopathy. Prior prostatectomy. Pelvic floor laxity.  Urinary bladder decompressed. No significant free fluid.  Degenerative fusion of the bilateral sacroiliac joints. Mild  osteopenia. Diffuse idiopathic skeletal hyperostosis within the  thoracolumbar spine.  IMPRESSION:  1. Similar appearance of a large hiatal hernia, containing the  stomach, portions of the colon, and minimal small bowel/pancreas.  Similar organoaxial gastric volvulus. No evidence of acute  obstruction or complicating ischemia.  2. Bilateral fat and likely nonobstructive bowel containing  inguinal hernias.  3. No other explanation for pain.  4. Cholelithiasis.  Original Report Authenticated By: Consuello Bossier, M.D.

## 2011-04-22 NOTE — Assessment & Plan Note (Signed)
1/13  x 5-7 days Fleet enema Senokot S prn

## 2011-04-26 LAB — LACTATE DEHYDROGENASE, ISOENZYMES

## 2011-04-27 ENCOUNTER — Other Ambulatory Visit: Payer: Self-pay | Admitting: Internal Medicine

## 2011-04-27 DIAGNOSIS — R0789 Other chest pain: Secondary | ICD-10-CM | POA: Diagnosis not present

## 2011-04-27 DIAGNOSIS — K5900 Constipation, unspecified: Secondary | ICD-10-CM | POA: Diagnosis not present

## 2011-04-27 DIAGNOSIS — R1012 Left upper quadrant pain: Secondary | ICD-10-CM | POA: Diagnosis not present

## 2011-04-29 LAB — LACTATE DEHYDROGENASE, ISOENZYMES
LDH 1: 26 % (ref 19–38)
LDH 2: 32 % (ref 30–43)
LDH 4: 9 % (ref 3–12)
LDH Isoenzymes, Total: 155 U/L (ref 120–250)

## 2011-05-16 ENCOUNTER — Ambulatory Visit (INDEPENDENT_AMBULATORY_CARE_PROVIDER_SITE_OTHER): Payer: Medicare Other | Admitting: Internal Medicine

## 2011-05-16 ENCOUNTER — Encounter: Payer: Self-pay | Admitting: Internal Medicine

## 2011-05-16 DIAGNOSIS — K219 Gastro-esophageal reflux disease without esophagitis: Secondary | ICD-10-CM | POA: Diagnosis not present

## 2011-05-16 DIAGNOSIS — Z8546 Personal history of malignant neoplasm of prostate: Secondary | ICD-10-CM

## 2011-05-16 DIAGNOSIS — I10 Essential (primary) hypertension: Secondary | ICD-10-CM | POA: Diagnosis not present

## 2011-05-16 DIAGNOSIS — K440 Diaphragmatic hernia with obstruction, without gangrene: Secondary | ICD-10-CM | POA: Diagnosis not present

## 2011-05-16 DIAGNOSIS — K5900 Constipation, unspecified: Secondary | ICD-10-CM

## 2011-05-16 DIAGNOSIS — R1012 Left upper quadrant pain: Secondary | ICD-10-CM

## 2011-05-16 NOTE — Assessment & Plan Note (Signed)
Continue with current prescription therapy as reflected on the Med list.  

## 2011-05-16 NOTE — Assessment & Plan Note (Signed)
Better w/prune juce

## 2011-05-16 NOTE — Progress Notes (Signed)
Patient ID: Jorge [name], male   DOB: 1930/03/23, 78 y.o.   MRN: [id number]  Subjective:    Patient ID: Jorge [name], male    DOB: 1929-10-19, 78 y.o.   MRN: [id number]  HPI F/u on  severe (10/10) LUQ abd pain - no relapse. No R CP. F/u HH, prostate ca   Review of Systems  Constitutional: Negative for fever, chills, diaphoresis, appetite change, fatigue and unexpected weight change.  HENT: Negative for nosebleeds, congestion, sore throat, sneezing, trouble swallowing and neck pain.   Eyes: Negative for itching and visual disturbance.  Respiratory: Negative for cough, choking, chest tightness and wheezing.   Cardiovascular: Negative for chest pain, palpitations and leg swelling.  Gastrointestinal: Positive for constipation (better). Negative for nausea, vomiting, abdominal pain, diarrhea, blood in stool, abdominal distention, anal bleeding and rectal pain.  Genitourinary: Negative for frequency and hematuria.  Musculoskeletal: Negative for back pain, joint swelling and gait problem.  Skin: Negative for rash.  Neurological: Negative for dizziness, tremors, speech difficulty and weakness.  Psychiatric/Behavioral: Negative for sleep disturbance, dysphoric mood and agitation. The patient is not nervous/anxious.        Objective:   Physical Exam  Constitutional: He is oriented to person, place, and time. He appears well-developed. No distress.       NAD Looks tired  HENT:  Mouth/Throat: Oropharynx is clear and moist.  Eyes: Conjunctivae are normal. Pupils are equal, round, and reactive to light.  Neck: Normal range of motion. No JVD present. No thyromegaly present.  Cardiovascular: Normal rate, regular rhythm, normal heart sounds and intact distal pulses.  Exam reveals no gallop and no friction rub.   No murmur heard. Pulmonary/Chest: Effort normal and breath sounds normal. No respiratory distress. He has no wheezes. He has no rales. He exhibits no tenderness.  Abdominal: Soft.  Bowel sounds are normal. He exhibits no distension and no mass. There is no tenderness (LUQ is sensitive to palp). There is no rebound and no guarding.  Musculoskeletal: Normal range of motion. He exhibits no edema and no tenderness.  Lymphadenopathy:    He has no cervical adenopathy.  Neurological: He is alert and oriented to person, place, and time. He has normal reflexes. No cranial nerve deficit. He exhibits normal muscle tone. Coordination normal.  Skin: Skin is warm and dry. No rash noted. He is not diaphoretic.  Psychiatric: He has a normal mood and affect. His behavior is normal. Judgment and thought content normal.             Assessment & Plan:

## 2011-05-16 NOTE — Assessment & Plan Note (Addendum)
Urol f/u is pending

## 2011-05-16 NOTE — Assessment & Plan Note (Signed)
No relapse 

## 2011-05-18 DIAGNOSIS — N393 Stress incontinence (female) (male): Secondary | ICD-10-CM | POA: Diagnosis not present

## 2011-05-18 DIAGNOSIS — C61 Malignant neoplasm of prostate: Secondary | ICD-10-CM | POA: Diagnosis not present

## 2011-05-18 DIAGNOSIS — N401 Enlarged prostate with lower urinary tract symptoms: Secondary | ICD-10-CM | POA: Diagnosis not present

## 2011-05-18 DIAGNOSIS — N138 Other obstructive and reflux uropathy: Secondary | ICD-10-CM | POA: Diagnosis not present

## 2011-06-02 ENCOUNTER — Encounter (HOSPITAL_COMMUNITY): Payer: Self-pay | Admitting: *Deleted

## 2011-06-02 ENCOUNTER — Emergency Department (HOSPITAL_COMMUNITY): Payer: Medicare Other

## 2011-06-02 ENCOUNTER — Inpatient Hospital Stay (HOSPITAL_COMMUNITY)
Admission: EM | Admit: 2011-06-02 | Discharge: 2011-06-07 | DRG: 392 | Disposition: A | Discharge status: Home / Self Care | Payer: Medicare Other | Attending: General Surgery | Admitting: General Surgery

## 2011-06-02 DIAGNOSIS — R1013 Epigastric pain: Secondary | ICD-10-CM | POA: Diagnosis not present

## 2011-06-02 DIAGNOSIS — K319 Disease of stomach and duodenum, unspecified: Secondary | ICD-10-CM | POA: Diagnosis not present

## 2011-06-02 DIAGNOSIS — K440 Diaphragmatic hernia with obstruction, without gangrene: Secondary | ICD-10-CM | POA: Diagnosis not present

## 2011-06-02 DIAGNOSIS — Z8546 Personal history of malignant neoplasm of prostate: Secondary | ICD-10-CM | POA: Diagnosis not present

## 2011-06-02 DIAGNOSIS — R1012 Left upper quadrant pain: Secondary | ICD-10-CM | POA: Diagnosis not present

## 2011-06-02 DIAGNOSIS — N400 Enlarged prostate without lower urinary tract symptoms: Secondary | ICD-10-CM | POA: Diagnosis present

## 2011-06-02 DIAGNOSIS — K2970 Gastritis, unspecified, without bleeding: Secondary | ICD-10-CM | POA: Diagnosis not present

## 2011-06-02 DIAGNOSIS — R933 Abnormal findings on diagnostic imaging of other parts of digestive tract: Secondary | ICD-10-CM | POA: Diagnosis not present

## 2011-06-02 DIAGNOSIS — K5289 Other specified noninfective gastroenteritis and colitis: Secondary | ICD-10-CM | POA: Diagnosis not present

## 2011-06-02 DIAGNOSIS — K3189 Other diseases of stomach and duodenum: Secondary | ICD-10-CM | POA: Diagnosis not present

## 2011-06-02 DIAGNOSIS — I10 Essential (primary) hypertension: Secondary | ICD-10-CM | POA: Diagnosis present

## 2011-06-02 DIAGNOSIS — R109 Unspecified abdominal pain: Secondary | ICD-10-CM | POA: Diagnosis not present

## 2011-06-02 DIAGNOSIS — R072 Precordial pain: Secondary | ICD-10-CM | POA: Diagnosis not present

## 2011-06-02 DIAGNOSIS — K8020 Calculus of gallbladder without cholecystitis without obstruction: Secondary | ICD-10-CM | POA: Diagnosis present

## 2011-06-02 DIAGNOSIS — K449 Diaphragmatic hernia without obstruction or gangrene: Secondary | ICD-10-CM

## 2011-06-02 DIAGNOSIS — K5900 Constipation, unspecified: Secondary | ICD-10-CM | POA: Diagnosis present

## 2011-06-02 LAB — DIFFERENTIAL
Basophils Absolute: 0 10*3/uL (ref 0.0–0.1)
Basophils Relative: 0 % (ref 0–1)
Eosinophils Absolute: 0 10*3/uL (ref 0.0–0.7)
Eosinophils Relative: 0 % (ref 0–5)

## 2011-06-02 LAB — COMPREHENSIVE METABOLIC PANEL
AST: 20 U/L (ref 0–37)
Albumin: 3.8 g/dL (ref 3.5–5.2)
Alkaline Phosphatase: 90 U/L (ref 39–117)
BUN: 12 mg/dL (ref 6–23)
Chloride: 105 mEq/L (ref 96–112)
Potassium: 3.9 mEq/L (ref 3.5–5.1)
Total Bilirubin: 0.5 mg/dL (ref 0.3–1.2)

## 2011-06-02 LAB — CBC
MCH: 31.7 pg (ref 26.0–34.0)
MCHC: 33.9 g/dL (ref 30.0–36.0)
MCV: 93.4 fL (ref 78.0–100.0)
Platelets: 191 10*3/uL (ref 150–400)
RDW: 13.1 % (ref 11.5–15.5)
WBC: 7 10*3/uL (ref 4.0–10.5)

## 2011-06-02 LAB — LIPASE, BLOOD: Lipase: 25 U/L (ref 11–59)

## 2011-06-02 MED ORDER — PANTOPRAZOLE SODIUM 40 MG IV SOLR
40.0000 mg | Freq: Every day | INTRAVENOUS | Status: DC
Start: 2011-06-03 — End: 2011-06-07
  Administered 2011-06-03 – 2011-06-06 (×4): 40 mg via INTRAVENOUS
  Filled 2011-06-02 (×5): qty 40

## 2011-06-02 MED ORDER — DIPHENHYDRAMINE HCL 50 MG/ML IJ SOLN: 12.5000 mg | Freq: Four times a day (QID) | INTRAMUSCULAR | Status: DC | PRN

## 2011-06-02 MED ORDER — ONDANSETRON HCL 4 MG/2ML IJ SOLN: 4.0000 mg | Freq: Four times a day (QID) | INTRAMUSCULAR | Status: DC | PRN

## 2011-06-02 MED ORDER — HYDROMORPHONE HCL PF 1 MG/ML IJ SOLN
1.0000 mg | Freq: Once | INTRAMUSCULAR | Status: AC
  Administered 2011-06-02: 1 mg via INTRAVENOUS
  Filled 2011-06-02: qty 1

## 2011-06-02 MED ORDER — DIPHENHYDRAMINE HCL 12.5 MG/5ML PO ELIX
12.5000 mg | ORAL_SOLUTION | Freq: Four times a day (QID) | ORAL | Status: DC | PRN
  Filled 2011-06-02: qty 5

## 2011-06-02 MED ORDER — SODIUM CHLORIDE 0.9 % IV SOLN
Freq: Once | INTRAVENOUS | Status: AC
  Administered 2011-06-02: 17:00:00 via INTRAVENOUS

## 2011-06-02 MED ORDER — KCL IN DEXTROSE-NACL 20-5-0.9 MEQ/L-%-% IV SOLN
INTRAVENOUS | Status: DC
  Administered 2011-06-03 – 2011-06-07 (×11): via INTRAVENOUS
  Filled 2011-06-02 (×16): qty 1000

## 2011-06-02 MED ORDER — ONDANSETRON HCL 4 MG/2ML IJ SOLN
4.0000 mg | Freq: Once | INTRAMUSCULAR | Status: AC
  Administered 2011-06-02: 4 mg via INTRAVENOUS
  Filled 2011-06-02: qty 2

## 2011-06-02 MED ORDER — SODIUM CHLORIDE 0.9 % IV BOLUS (SEPSIS)
1000.0000 mL | Freq: Once | INTRAVENOUS | Status: AC
  Administered 2011-06-02: 1000 mL via INTRAVENOUS

## 2011-06-02 MED ORDER — HYDROMORPHONE HCL PF 1 MG/ML IJ SOLN
1.0000 mg | INTRAMUSCULAR | Status: DC | PRN
Start: 2011-06-02 — End: 2011-06-04
  Administered 2011-06-03 – 2011-06-04 (×5): 1 mg via INTRAVENOUS
  Filled 2011-06-02 (×6): qty 1

## 2011-06-02 NOTE — ED Notes (Addendum)
Pt is from home, alert and oriented x4, ambulatory. ems reports pt has had chronic abdominal pain for 6 months. Has hx of hernia on left abdominal side, hernia has been untreated, only given pain medication. One episode like this before, dr told pt that if an epidsode like this happened again to call 911, so pt called 911. This am pt was doing a lot of physical activity, out of his ordinary, pain then started, sharp 8/10 on left upper side, radiates to back and left upper shoulder. Hx of enlarged prostate, pt reports "dr did not call it cancer, but he got radiation for it." pt denies n/v/d.    Upon assessment pt reports that he went to see dr plotnicoff ( [place]) about 1 month ago for the hernia. This dr is who told pt when pain returned to go straight to ER. Pt reports that he was bending down washing his dog and felt like he was going to fall down and pain returned, pain has been the worst pt has experienced in the last year.

## 2011-06-02 NOTE — ED Notes (Signed)
Pt report he refuses to have gastric tube placed. Pt alert and oriented x4. Respirations even and unlabored, bilateral symmetrical rise and fall of chest. Skin warm and dry. In no acute distress. Denies needs.

## 2011-06-02 NOTE — ED Notes (Signed)
Called for report x1. RN unable to receive report at this time

## 2011-06-02 NOTE — Progress Notes (Signed)
Patient ID: AARIK BLANK, male   DOB: 1929/06/17, 78 y.o.   MRN: [id number] I examined the patient and I discussed his care with Dr. Andrey Campanile.  I also strongly recommend NGT placement.  The patient is refusing this. We will also ask [facility] to see the patient in consultation.  Violeta Gelinas, MD, MPH, FACS Pager: [phone]

## 2011-06-02 NOTE — ED Provider Notes (Signed)
6:10 PM Dr. Gaynelle Adu of general surgery has evaluated the patient and is admitting the patient to [facility] surgery, Dr. Janee Morn at [facility] step down unit for lambert of surgical intervention on his hiatal hernia along with thoracic surgery.  Felisa Bonier, MD 06/02/11 [phone]

## 2011-06-02 NOTE — ED Notes (Signed)
Reported given to floor. [facility] called. Waiting for [facility] to call back to give report

## 2011-06-02 NOTE — H&P (Signed)
Jorge [name] is an 78 y.o. male.   Chief Complaint: left upper quadrant pain HPI:  78 year old Caucasian male who has a known history of a large hiatal hernia comes into the emergency room complaining of acute onset of left upper quadrant pain earlier this morning. The patient was doing well this morning. He ate a normal breakfast and then went outside to wash his dog. He then developed acute onset of severe left upper quadrant pain radiating to his chest. He was very severe and sharp. He has had similar episodes in the past; however, this attack was much more severe. He called 911 and was transported to the emergency room. He also had some nausea during this time. He has had previous episodes of this type of pain in the past. He states that he is having these episodes much more frequently. He went to see his primary care physician in January and February for similar symptoms. He has had this hiatal hernia for over 5 years. He also has problems with constipation. Normally he has a bowel movement about every 2 days; however, on occasion he will go 5-7 days without having a bowel movement. He denies any fevers, chills, vomiting, melena, hematochezia, shortness of breath. He states that he does get winded with some exertion. However he states he is able to walk around in the grocery store without problems. He does sleep with 2 pillows behind his head. He states that if he lays flat he will get short of breath. His last bowel movement was 2 days ago. He reports that he is having flatus. Past Medical History  Diagnosis Date  . Anxiety   . GERD (gastroesophageal reflux disease)   . Hypertension   . Gastric ulcer     Dr. Christella Hartigan  . ED (erectile dysfunction)   . History of colonic polyps   . Prostate cancer 2010    Dr. Lenn Sink  . Vitamin d deficiency   . Insomnia   . Hernia, hiatal   . Syncope 2011    from Bicalutamide    Past Surgical History  Procedure Date  . Transurethral resection of  prostate     Family History  Problem Relation Age of Onset  . Pancreatic cancer Father   . Hypertension Father   . Hypertension Other    Social History:  reports that he has quit smoking. He does not have any smokeless tobacco history on file. He reports that he drinks alcohol. He reports that he does not use illicit drugs.  Allergies:  Allergies  Allergen Reactions  . Aspirin   . Azithromycin   . Cefuroxime Axetil   . Iodine     Per pt tremors and dyspnea  . Naproxen     REACTION: upset stomach - like with other NSAIDs  . Penicillins     Pt reports allergy to penicillins     Medications Prior to Admission  Medication Dose Route Frequency Provider Last Rate Last Dose  . 0.9 %  sodium chloride infusion   Intravenous Once Gerhard Munch, MD 20 mL/hr at 06/02/11 1705    . HYDROmorphone (DILAUDID) injection 1 mg  1 mg Intravenous Once Gerhard Munch, MD   1 mg at 06/02/11 1335  . HYDROmorphone (DILAUDID) injection 1 mg  1 mg Intravenous Once Gerhard Munch, MD   1 mg at 06/02/11 1706  . ondansetron (ZOFRAN) injection 4 mg  4 mg Intravenous Once Gerhard Munch, MD   4 mg at 06/02/11 1335  . ondansetron (ZOFRAN)  injection 4 mg  4 mg Intravenous Once Gerhard Munch, MD   4 mg at 06/02/11 1705  . sodium chloride 0.9 % bolus 1,000 mL  1,000 mL Intravenous Once Gerhard Munch, MD   1,000 mL at 06/02/11 1336   Medications Prior to Admission  Medication Sig Dispense Refill  . cholecalciferol (VITAMIN D) 1000 UNITS tablet Take 1 tablet (1,000 Units total) by mouth daily.  100 tablet  3  . diphenhydramine-acetaminophen (TYLENOL PM) 25-500 MG TABS Take 1 tablet by mouth at bedtime as needed. For sleep      . Oxycodone HCl 10 MG TABS Take 1 tablet (10 mg total) by mouth 4 (four) times daily as needed (pain).  20 each  0  . Simethicone (GAS-X EXTRA STRENGTH) 125 MG CAPS Take 1 capsule (125 mg total) by mouth 2 (two) times daily as needed (bloating and gas).  28 each  0  . terazosin  (HYTRIN) 2 MG capsule Take 1 capsule (2 mg total) by mouth at bedtime.  90 capsule  3    Results for orders placed during the hospital encounter of 06/02/11 (from the past 48 hour(s))  COMPREHENSIVE METABOLIC PANEL     Status: Abnormal   Collection Time   06/02/11  1:30 PM      Component Value Range Comment   Sodium 141  135 - 145 (mEq/L)    Potassium 3.9  3.5 - 5.1 (mEq/L)    Chloride 105  96 - 112 (mEq/L)    CO2 27  19 - 32 (mEq/L)    Glucose, Bld 138 (*) 70 - 99 (mg/dL)    BUN 12  6 - 23 (mg/dL)    Creatinine, Ser 1.61  0.50 - 1.35 (mg/dL)    Calcium 9.7  8.4 - 10.5 (mg/dL)    Total Protein 6.7  6.0 - 8.3 (g/dL)    Albumin 3.8  3.5 - 5.2 (g/dL)    AST 20  0 - 37 (U/L) SLIGHT HEMOLYSIS   ALT 14  0 - 53 (U/L)    Alkaline Phosphatase 90  39 - 117 (U/L)    Total Bilirubin 0.5  0.3 - 1.2 (mg/dL)    GFR calc non Af Amer 80 (*) >90 (mL/min)    GFR calc Af Amer >90  >90 (mL/min)   CBC     Status: Normal   Collection Time   06/02/11  1:30 PM      Component Value Range Comment   WBC 7.0  4.0 - 10.5 (K/uL)    RBC 4.70  4.22 - 5.81 (MIL/uL)    Hemoglobin 14.9  13.0 - 17.0 (g/dL)    HCT 09.6  04.5 - 40.9 (%)    MCV 93.4  78.0 - 100.0 (fL)    MCH 31.7  26.0 - 34.0 (pg)    MCHC 33.9  30.0 - 36.0 (g/dL)    RDW 81.1  91.4 - 78.2 (%)    Platelets 191  150 - 400 (K/uL)   DIFFERENTIAL     Status: Abnormal   Collection Time   06/02/11  1:30 PM      Component Value Range Comment   Neutrophils Relative 82 (*) 43 - 77 (%)    Neutro Abs 5.7  1.7 - 7.7 (K/uL)    Lymphocytes Relative 9 (*) 12 - 46 (%)    Lymphs Abs 0.6 (*) 0.7 - 4.0 (K/uL)    Monocytes Relative 9  3 - 12 (%)    Monocytes Absolute 0.6  0.1 - 1.0 (K/uL)    Eosinophils Relative 0  0 - 5 (%)    Eosinophils Absolute 0.0  0.0 - 0.7 (K/uL)    Basophils Relative 0  0 - 1 (%)    Basophils Absolute 0.0  0.0 - 0.1 (K/uL)   LIPASE, BLOOD     Status: Normal   Collection Time   06/02/11  1:30 PM      Component Value Range Comment    Lipase 25  11 - 59 (U/L)     RADIOLOGICAL STUDIES: I have personally reviewed the radiological exams myself  Ct Abdomen Pelvis Wo Contrast  06/02/2011  *RADIOLOGY REPORT*  Clinical Data: Abdominal pain.  CT ABDOMEN AND PELVIS WITHOUT CONTRAST  Technique:  Multidetector CT imaging of the abdomen and pelvis was performed following the standard protocol without intravenous contrast.  Comparison: CT scan dated 04/22/2011  Findings: Again noted is a huge hiatal hernia with or general axilla volvulus.  There is a functional obstruction of the distal stomach at the level of the diaphragm.  The stomach is more distended than on the prior study has marked mass effect upon the adjacent heart and other mediastinal structures.  The compression of the heart has increased since the prior exam.  The esophagus is not dilated.  A portion of the transverse colon is herniated through the diaphragmatic defect.  The small bowel herniation seen on the prior study as well as the pancreatic herniations seen on the prior study are not present on the current exam.  The liver, spleen, pancreas, adrenal glands, and kidneys demonstrate no significant abnormalities.  There are multiple gallstones in the nondistended gallbladder.  No bile duct dilatation.  Scattered diverticula in the colon.  The colon is quite redundant. Inguinal hernias containing only fat.  IMPRESSION: Huge hiatal hernia with the majority of the stomach in the chest. There is marked increased distention of the stomach with a functional obstruction of the distal stomach at the level of the diaphragmatic defect.  The stomach is distended to over 17 cm in diameter.  The markedly distended stomach is having a significant mass effect upon the heart and other mediastinal structures, more so than on the prior exams.  Original Report Authenticated By: Gwynn Burly, M.D.    Review of Systems  Constitutional: Positive for weight loss (7 pounds over past several months).  Negative for fever, chills and diaphoresis.  HENT: Negative for hearing loss and neck pain.   Eyes: Negative for blurred vision and double vision.  Respiratory: Negative for cough, sputum production and wheezing.   Cardiovascular: Positive for orthopnea. Negative for chest pain, claudication, leg swelling and PND.       Gets a little short of breath if lays flat; some DOE. No SOB at rest  Gastrointestinal: Positive for nausea, abdominal pain and constipation. Negative for heartburn, vomiting, diarrhea, blood in stool and melena.  Genitourinary: Negative for dysuria and urgency.  Musculoskeletal: Negative for myalgias and joint pain.  Skin: Negative for itching and rash.  Neurological: Negative for dizziness, tremors, focal weakness, seizures, loss of consciousness, weakness and headaches.  Psychiatric/Behavioral: Negative for depression and suicidal ideas.    Blood pressure 146/90, pulse 78, temperature 98.1 F (36.7 C), temperature source Oral, resp. rate 16, SpO2 96.00%. Physical Exam  Vitals reviewed. Constitutional: He is oriented to person, place, and time. He appears well-developed and well-nourished. No distress.  HENT:  Head: Normocephalic and atraumatic.  Right Ear: External ear normal.  Left Ear: External  ear normal.  Eyes: Conjunctivae are normal. No scleral icterus.  Neck: Normal range of motion. Neck supple. No JVD present. No tracheal deviation present. No thyromegaly present.  Cardiovascular: Normal rate, regular rhythm and normal heart sounds.   Respiratory: Effort normal. No respiratory distress. He has no wheezes.       Decreased BS b/l bases (left worse than right); can appreciated bowel sounds in chest  GI: Soft. He exhibits no distension. There is tenderness (very subtle TTP in luq). There is no rebound and no guarding.  Musculoskeletal: Normal range of motion. He exhibits no edema and no tenderness.  Lymphadenopathy:    He has no cervical adenopathy.    Neurological: He is alert and oriented to person, place, and time. He exhibits normal muscle tone.  Skin: Skin is warm and dry. No rash noted. He is not diaphoretic. No erythema.  Psychiatric: He has a normal mood and affect. His behavior is normal. Judgment and thought content normal.     Assessment/Plan #1-incarcerated massive hiatal hernia #2-hypertension #3-BPH #4-history of prostate cancer  I have reviewed his CT scan. His hiatal hernia is quite impressive. Currently there are no clinical signs that he is strangulated. It appears he is having more frequent episodes of abdominal pain and chest pain as a result of this incarcerated hiatal hernia. I have recommended to the patient and his wife that he be transferred and admitted to [facility] to our Gen. Surgery service. I believe he needs a thoracic surgery consultation to help with surgical planning. It is quite possible that the abdominal contents may not be able to be reduced through a abdominal incision and may require thoracic surgery involvement.  I have recommended to the patient that we place a nasogastric tube for decompression of his stomach. I explained the benefits of having a nasogastric tube placed. However the patient has refused NG tube placement at this time. I have discussed the case with Dr. Janee Morn and he is awaiting the patient's arrival at [facility].  Mary Sella. Andrey Campanile, MD, FACS General, Bariatric, & Minimally Invasive Surgery [facility], [place]   [facility] M 06/02/2011, 6:25 PM

## 2011-06-02 NOTE — ED Notes (Signed)
ONG:[id number]<WU> Expected date:<BR> Expected time:<BR> Means of arrival:<BR> Comments:<BR> EMS 120 GC, 81 yom abd. Pain w back pain

## 2011-06-02 NOTE — ED Notes (Signed)
Report given to [facility]  

## 2011-06-02 NOTE — ED Notes (Signed)
Pt alert and oriented x4. Respirations even and unlabored, bilateral symmetrical rise and fall of chest. Skin warm and dry. In no acute distress. Denies needs.  md at bedside 

## 2011-06-02 NOTE — ED Provider Notes (Signed)
History     CSN: [id number]  Arrival date & time 06/02/11  1229   First MD Initiated Contact with Patient 06/02/11 1246      Chief Complaint  Patient presents with  . Abdominal Pain    The history is limited by the absence of a caregiver.   The patient presents with abdominal pain.  He has a history of hernia, dating back decades.  Today, after a strenuous motion he felt the sudden onset of pain about the left lower quadrant.  Since onset the pain has been present, though it has improved slightly.  The pain is sharp, focal, nonradiating.  The pain is worse with motion, better at rest. He endorses nausea, denies vomiting, denies diarrhea.  He denies fevers, chills, chest pain, dyspnea. Past Medical History  Diagnosis Date  . Anxiety   . GERD (gastroesophageal reflux disease)   . Hypertension   . Gastric ulcer     Dr. Christella Hartigan  . ED (erectile dysfunction)   . History of colonic polyps   . Prostate cancer 2010    Dr. Lenn Sink  . Vitamin d deficiency   . Insomnia   . Hernia, hiatal   . Syncope 2011    from Bicalutamide    Past Surgical History  Procedure Date  . Transurethral resection of prostate     Family History  Problem Relation Age of Onset  . Pancreatic cancer Father   . Hypertension Father   . Hypertension Other     History  Substance Use Topics  . Smoking status: Former Games developer  . Smokeless tobacco: Not on file  . Alcohol Use: Yes      Review of Systems  Constitutional:       Per HPI, otherwise negative  HENT:       Per HPI, otherwise negative  Eyes: Negative.   Respiratory:       Per HPI, otherwise negative  Cardiovascular:       Per HPI, otherwise negative  Gastrointestinal: Negative for vomiting.  Genitourinary: Negative.   Musculoskeletal:       Per HPI, otherwise negative  Skin: Negative.   Neurological: Negative for syncope.    Allergies  Aspirin; Azithromycin; Cefuroxime axetil; Iodine; Naproxen; and Penicillins  Home Medications    Current Outpatient Rx  Name Route Sig Dispense Refill  . VITAMIN D 1000 UNITS PO TABS Oral Take 1 tablet (1,000 Units total) by mouth daily. 100 tablet 3  . DIPHENHYDRAMINE-APAP (SLEEP) 25-500 MG PO TABS Oral Take 1 tablet by mouth at bedtime as needed. For sleep    . OXYCODONE HCL 10 MG PO TABS Oral Take 1 tablet (10 mg total) by mouth 4 (four) times daily as needed (pain). 20 each 0  . SIMETHICONE 125 MG PO CAPS Oral Take 1 capsule (125 mg total) by mouth 2 (two) times daily as needed (bloating and gas). 28 each 0  . TERAZOSIN HCL 2 MG PO CAPS Oral Take 1 capsule (2 mg total) by mouth at bedtime. 90 capsule 3    BP 159/102  Pulse 102  Temp(Src) 98 F (36.7 C) (Oral)  Resp 16  SpO2 96%  Physical Exam  Nursing note and vitals reviewed. Constitutional: He is oriented to person, place, and time. He appears well-developed. No distress.  HENT:  Head: Normocephalic and atraumatic.  Eyes: Conjunctivae and EOM are normal.  Cardiovascular: Normal rate and regular rhythm.   Pulmonary/Chest: Effort normal. No stridor. No respiratory distress.  Abdominal: He exhibits no distension.  There is tenderness in the left lower quadrant. There is no rigidity and no guarding. A hernia is present.  Musculoskeletal: He exhibits no edema.  Neurological: He is alert and oriented to person, place, and time.  Skin: Skin is warm and dry.  Psychiatric: He has a normal mood and affect.    ED Course  Procedures (including critical care time)  Labs Reviewed  DIFFERENTIAL - Abnormal; Notable for the following:    Neutrophils Relative 82 (*)    Lymphocytes Relative 9 (*)    Lymphs Abs 0.6 (*)    All other components within normal limits  CBC  COMPREHENSIVE METABOLIC PANEL  LIPASE, BLOOD   No results found.   No diagnosis found.  CT reviewed by me  MDM  This 78 year old male with a long history of hernia, now presents with an acute exacerbation of pain.  On exam he is in no distress, though he  has mild tenderness to palpation about the lower abdomen.  The patient is hemodynamically stable, but his CT demonstrates a significant hiatal hernia with mass effect on mediastinal structures and functional obstruction of his distal stomach.  Given this significant CT findings, the progression since the most recent evaluation, surgery has been consulted for evaluation.  Surgery saw / evaluated the patient.  He was admitted for further E/M.        Gerhard Munch, MD 06/03/11 [phone]

## 2011-06-03 ENCOUNTER — Inpatient Hospital Stay (HOSPITAL_COMMUNITY): Payer: Medicare Other

## 2011-06-03 DIAGNOSIS — R1012 Left upper quadrant pain: Secondary | ICD-10-CM | POA: Diagnosis not present

## 2011-06-03 DIAGNOSIS — K449 Diaphragmatic hernia without obstruction or gangrene: Secondary | ICD-10-CM

## 2011-06-03 LAB — COMPREHENSIVE METABOLIC PANEL
Alkaline Phosphatase: 75 U/L (ref 39–117)
BUN: 10 mg/dL (ref 6–23)
CO2: 28 mEq/L (ref 19–32)
Chloride: 105 mEq/L (ref 96–112)
Creatinine, Ser: 0.93 mg/dL (ref 0.50–1.35)
GFR calc Af Amer: >90 mL/min (ref >90)
GFR calc non Af Amer: 78 mL/min — ABNORMAL LOW (ref >90)
Glucose, Bld: 127 mg/dL — ABNORMAL HIGH (ref 70–99)
Potassium: 4.2 mEq/L (ref 3.5–5.1)
Total Bilirubin: 0.3 mg/dL (ref 0.3–1.2)

## 2011-06-03 LAB — PROTIME-INR
INR: 1.06 (ref 0.00–1.49)
Prothrombin Time: 14 seconds (ref 11.6–15.2)

## 2011-06-03 NOTE — Progress Notes (Signed)
Patient ID: Jorge [name], male   DOB: Mar 20, 1930, 78 y.o.   MRN: [id number]  General Surgery - [facility] - Progress Note  Subjective: Patient comfortable.  No pain.  No nausea or emesis.  No SOB.  In Stepdown unit pending thoracic surgery evaluation.  Objective: Vital signs in last 24 hours: Temp:  [97.6 F (36.4 C)-98.4 F (36.9 C)] 98.2 F (36.8 C) (03/01 0755) Pulse Rate:  [69-102] 76  (03/01 0755) Resp:  [16-23] 23  (03/01 0755) BP: (108-159)/(63-102) 151/88 mmHg (03/01 0755) SpO2:  [91 %-97 %] 96 % (03/01 0755) Weight:  [197 lb 15.6 oz (89.8 kg)] 197 lb 15.6 oz (89.8 kg) (02/28 2125) Last BM Date: 05/31/11  Intake/Output from previous day: 02/28 0701 - 03/01 0700 In: 700 [I.V.:700] Out: 325 [Urine:325]  Exam: HEENT - clear, not icteric Neck - soft without mass Chest - clear bilaterally Cor - RRR, no murmur Abd - soft without distension; BS present; no tenderness; well healed low midline incision Ext - no significant edema Neuro - grossly intact, no focal deficits  Lab Results:   Basename 06/02/11 1330  WBC 7.0  HGB 14.9  HCT 43.9  PLT 191     Basename 06/03/11 0430 06/02/11 1330  NA 138 141  K 4.2 3.9  CL 105 105  CO2 28 27  GLUCOSE 127* 138*  BUN 10 12  CREATININE 0.93 0.87  CALCIUM 9.0 9.7    Studies/Results: Ct Abdomen Pelvis Wo Contrast  06/02/2011  *RADIOLOGY REPORT*  Clinical Data: Abdominal pain.  CT ABDOMEN AND PELVIS WITHOUT CONTRAST  Technique:  Multidetector CT imaging of the abdomen and pelvis was performed following the standard protocol without intravenous contrast.  Comparison: CT scan dated 04/22/2011  Findings: Again noted is a huge hiatal hernia with or general axilla volvulus.  There is a functional obstruction of the distal stomach at the level of the diaphragm.  The stomach is more distended than on the prior study has marked mass effect upon the adjacent heart and other mediastinal structures.  The compression  of the heart has increased since the prior exam.  The esophagus is not dilated.  A portion of the transverse colon is herniated through the diaphragmatic defect.  The small bowel herniation seen on the prior study as well as the pancreatic herniations seen on the prior study are not present on the current exam.  The liver, spleen, pancreas, adrenal glands, and kidneys demonstrate no significant abnormalities.  There are multiple gallstones in the nondistended gallbladder.  No bile duct dilatation.  Scattered diverticula in the colon.  The colon is quite redundant. Inguinal hernias containing only fat.  IMPRESSION: Huge hiatal hernia with the majority of the stomach in the chest. There is marked increased distention of the stomach with a functional obstruction of the distal stomach at the level of the diaphragmatic defect.  The stomach is distended to over 17 cm in diameter.  The markedly distended stomach is having a significant mass effect upon the heart and other mediastinal structures, more so than on the prior exams.  Original Report Authenticated By: Gwynn Burly, M.D.   Dg Chest Port 1 View  06/03/2011  *RADIOLOGY REPORT*  Clinical Data: Hiatal hernia.  PORTABLE CHEST - 1 VIEW  Comparison: 04/19/2009  Findings: Large hiatal hernia again noted.  Stable marked elevation of the left hemidiaphragm.  Areas of atelectasis and/or scarring bilaterally.  Rule probable cardiomegaly.  IMPRESSION: No interval change.  Original Report Authenticated By:  Cyndie Chime, M.D.    Assessment: Giant hiatal hernia with partial obstruction, currently asymptomatic Hx of prostate cancer  Plan: Thoracic surgery consultation requested - will see today If no urgent intervention needed, may begin diet and transfer to floor Will ask Dr. Wenda Low to review studies - patient states he had been referred to Dr. Daphine Deutscher in 2008 but never had any follow up   Velora Heckler, MD, [facility] Office:  [phone]  06/03/2011

## 2011-06-03 NOTE — Consult Note (Signed)
[address]            [place] [address]          [phone]       CHARLOTTE BRAFFORD [facility] Medical Record #[id number] Date of Birth: May 16, 1929  Referring: Dr Elwyn Lade Primary Care: Sonda Primes, MD, MD  Chief Complaint:    Chief Complaint  Patient presents with  . Abdominal Pain    History of Present Illness:     Patient is a 78 year old male with a known very large paraesophageal hernia present who comes to the emergency room yesterday with left lower quadrant abdominal pain. On further questioning the patient's had recent weight loss up to 6-8 pounds. More importantly increasing difficulty eating, with early satiety, notes that when he goes out to eat he's usually unable to the more than half a meal. In the past the had developed a GI bleed and was evaluated and scheduled for surgical repair of his paraesophageal hernia by Dr. Daphine Deutscher. However the surgery was delayed and the patient notes that he had been feeling better and did not pursue rescheduling surgery. He denies any recent GI blood loss. He's had no known history of myocardial infarction or coronary artery disease.   Current Activity/ Functional Status: Patient is independent with mobility/ambulation, transfers, ADL's, IADL's.   Past Medical History  Diagnosis Date  . Anxiety   . GERD (gastroesophageal reflux disease)   . Hypertension   . Gastric ulcer     Dr. Christella Hartigan  . ED (erectile dysfunction)   . History of colonic polyps   . Prostate cancer 2010    Dr. Lenn Sink  . Vitamin d deficiency   . Insomnia   . Hernia, hiatal   . Syncope 2011    from Bicalutamide    Past Surgical History  Procedure Date  . Transurethral resection of prostate     History  Smoking status  . Denies smoking  Smokeless tobacco  . Not on file    History  Alcohol Use  . Yes rarlely    History   Social History  . Marital Status: Married    Spouse Name: N/A    Occupational History  . Retired Holiday representative business    Social History Main Topics  . Smoking status: Former Games developer  . Smokeless tobacco: Not on file  . Alcohol Use: Yes  . Drug Use: No  . Sexually Active: Yes   Other Topics Concern  . Not on file   Social History Narrative   Regular exercise- yes/golf    Allergies  Allergen Reactions  . Aspirin   . Azithromycin   . Cefuroxime Axetil   . Iodine     Per pt tremors and dyspnea  . Naproxen     REACTION: upset stomach - like with other NSAIDs  . Penicillins     Pt reports allergy to penicillins     Current Facility-Administered Medications  Medication Dose Route Frequency Provider Last Rate Last Dose  . dextrose 5 % and 0.9 % NaCl with KCl 20 mEq/L infusion   Intravenous Continuous Liz Malady, MD 100 mL/hr at 06/03/11 1041    . diphenhydrAMINE (BENADRYL) injection 12.5 mg  12.5 mg Intravenous Q6H PRN Liz Malady, MD       Or  . diphenhydrAMINE (BENADRYL) 12.5 MG/5ML elixir 12.5 mg  12.5 mg Oral Q6H PRN  Liz Malady, MD      . HYDROmorphone (DILAUDID) injection 1 mg  1 mg Intravenous Q2H PRN Liz Malady, MD   1 mg at 06/03/11 1521  . ondansetron (ZOFRAN) injection 4 mg  4 mg Intravenous Q6H PRN Liz Malady, MD      . pantoprazole (PROTONIX) injection 40 mg  40 mg Intravenous QHS Liz Malady, MD        Prescriptions prior to admission  Medication Sig Dispense Refill  . cholecalciferol (VITAMIN D) 1000 UNITS tablet Take 1 tablet (1,000 Units total) by mouth daily.  100 tablet  3  . diphenhydramine-acetaminophen (TYLENOL PM) 25-500 MG TABS Take 1 tablet by mouth at bedtime as needed. For sleep      . Oxycodone HCl 10 MG TABS Take 1 tablet (10 mg total) by mouth 4 (four) times daily as needed (pain).  20 each  0  . Simethicone (GAS-X EXTRA STRENGTH) 125 MG CAPS Take 1 capsule (125 mg total) by mouth 2 (two) times daily as needed (bloating and gas).  28 each  0  . terazosin (HYTRIN) 2 MG  capsule Take 1 capsule (2 mg total) by mouth at bedtime.  90 capsule  3    Family History  Problem Relation Age of Onset  . Pancreatic cancer Father   . Hypertension Father   . Hypertension Other      Review of Systems:     Cardiac Review of Systems: Y or N  Chest Pain [  y ]  Resting SOB [ n  ] Exertional SOB  Cove.Etienne  ]  Orthopnea Milo.Brash  ]   Pedal Edema [   ]    Palpitations Milo.Brash  ] Syncope  [ n ]   Presyncope [   ]  General Review of Systems: [Y] = yes [  ]=no Constitional: recent weight change [y 6 lbs  ]; anorexia Cove.Etienne  ]; fatigue [  ]; nausea [  ]; night sweats [  ]; fever [  ]; or chills [  ];                                                                                                                                          Dental: poor dentition[  ]; Last Dentist visit:   Eye : blurred vision [  ]; diplopia [   ]; vision changes [  ];  Amaurosis fugax[  ]; Resp: cough [  ];  wheezing[ n ];  hemoptysis[  ]; shortness of breath[  ]; paroxysmal nocturnal dyspnea[  ]; dyspnea on exertion[  ]; or orthopnea[  ];  GI:  gallstones[  ], vomiting[  ];  dysphagia[  ]; melena[  ];  hematochezia [  ]; heartburn[  ];   Hx of  Colonoscopy[  ]; GU: kidney stones [  ]; hematuria[ y ];   dysuria Cove.Etienne  ];  nocturia[  ];  history of     obstruction Cove.Etienne  ];             Skin: rash, swelling[  ];, hair loss[  ];  peripheral edema[  ];  or itching[  ]; Musculosketetal: myalgias[  ];  joint swelling[  ];  joint erythema[  ];  joint pain[  ];  back pain[  ];  Heme/Lymph: bruising[  ];  bleeding[  ];  anemia[  ];  Neuro: TIA[  ];  headaches[  ];  stroke[  ];  vertigo[ n ];  seizures[n  ];   paresthesias[  n];  difficulty walking[ n ];  Psych:depression[  ]; anxiety[  ];  Endocrine: diabetes[  ];  thyroid dysfunction[  ];  Immunizations: Flu [ ? ]; Pneumococcal[?  ];  Other:  Physical Exam: BP 124/80  Pulse 76  Temp(Src) 98.1 F (36.7 C) (Oral)  Resp 19  Ht 6\' 1"  (1.854 m)  Wt 197 lb 15.6 oz (89.8 kg)  BMI  26.12 kg/m2  SpO2 95%  General appearance: alert, cooperative, appears stated age and no distress Neurologic: intact Heart: regular rate and rhythm, S1, S2 normal, no murmur, click, rub or gallop Lungs: clear to auscultation bilaterally Abdomen: soft, non-tender; bowel sounds normal; no masses,  no organomegaly Extremities: extremities normal, atraumatic, no cyanosis or edema and Homans sign is negative, no sign of DVT no cervical or axillary adenopthy   Diagnostic Studies & Laboratory data:     Recent Radiology Findings:   Ct Abdomen Pelvis Wo Contrast  06/02/2011  *RADIOLOGY REPORT*  Clinical Data: Abdominal pain.  CT ABDOMEN AND PELVIS WITHOUT CONTRAST  Technique:  Multidetector CT imaging of the abdomen and pelvis was performed following the standard protocol without intravenous contrast.  Comparison: CT scan dated 04/22/2011  Findings: Again noted is a huge hiatal hernia with or general axilla volvulus.  There is a functional obstruction of the distal stomach at the level of the diaphragm.  The stomach is more distended than on the prior study has marked mass effect upon the adjacent heart and other mediastinal structures.  The compression of the heart has increased since the prior exam.  The esophagus is not dilated.  A portion of the transverse colon is herniated through the diaphragmatic defect.  The small bowel herniation seen on the prior study as well as the pancreatic herniations seen on the prior study are not present on the current exam.  The liver, spleen, pancreas, adrenal glands, and kidneys demonstrate no significant abnormalities.  There are multiple gallstones in the nondistended gallbladder.  No bile duct dilatation.  Scattered diverticula in the colon.  The colon is quite redundant. Inguinal hernias containing only fat.  IMPRESSION: Huge hiatal hernia with the majority of the stomach in the chest. There is marked increased distention of the stomach with a functional obstruction  of the distal stomach at the level of the diaphragmatic defect.  The stomach is distended to over 17 cm in diameter.  The markedly distended stomach is having a significant mass effect upon the heart and other mediastinal structures, more so than on the prior exams.  Original Report Authenticated By: Gwynn Burly, M.D.   Dg Chest Port 1 View  06/03/2011  *RADIOLOGY REPORT*  Clinical Data: Hiatal hernia.  PORTABLE CHEST - 1 VIEW  Comparison: 04/19/2009  Findings: Large hiatal hernia again noted.  Stable marked elevation of the left hemidiaphragm.  Areas of atelectasis and/or scarring bilaterally.  Rule probable  cardiomegaly.  IMPRESSION: No interval change.  Original Report Authenticated By: Cyndie Chime, M.D.      Recent Lab Findings: Lab Results  Component Value Date   WBC 7.0 06/02/2011   HGB 14.9 06/02/2011   HCT 43.9 06/02/2011   PLT 191 06/02/2011   GLUCOSE 127* 06/03/2011   CHOL 167 01/10/2007   TRIG 89 01/10/2007   HDL 47.7 01/10/2007   LDLCALC 102* 01/10/2007   ALT 11 06/03/2011   AST 17 06/03/2011   NA 138 06/03/2011   K 4.2 06/03/2011   CL 105 06/03/2011   CREATININE 0.93 06/03/2011   BUN 10 06/03/2011   CO2 28 06/03/2011   TSH 1.41 11/19/2009   INR 1.06 06/03/2011   HGBA1C 6.1* 01/10/2007      Assessment / Plan:     I've reviewed the patient's extensive radiographic history of a very large paraesophageal hernia, which is been documented to be present since 2006. It appears now that it's becoming more symptomatic with intermittent obstructive symptoms, currently there is no appearance of active bleeding. The patient had previously been scheduled for surgical repair. I think this should occur on this admission. I would suggest GI followup and consider upper GI endoscopy to ensure there is no gastric ulcerations or masses. We'll also obtain an echocardiogram to evaluate his LV function. A collaborative effort with general surgery will be needed for surgical repair. The duration of this hernia may  make it difficult to reduce from the abdomen only. We'll continue to follow the patient with you and discuss surgical plan with Dr. Daphine Deutscher.  [name]  Beeper [phone] Office [phone] 06/03/2011 6:00 PM

## 2011-06-03 NOTE — Progress Notes (Signed)
UR of chart complete.  

## 2011-06-04 DIAGNOSIS — K449 Diaphragmatic hernia without obstruction or gangrene: Secondary | ICD-10-CM | POA: Diagnosis not present

## 2011-06-04 DIAGNOSIS — R1012 Left upper quadrant pain: Secondary | ICD-10-CM | POA: Diagnosis not present

## 2011-06-04 MED ORDER — HYDROMORPHONE HCL PF 1 MG/ML IJ SOLN
1.0000 mg | INTRAMUSCULAR | Status: DC | PRN
  Administered 2011-06-04 – 2011-06-06 (×5): 1 mg via INTRAVENOUS
  Filled 2011-06-04 (×4): qty 1

## 2011-06-04 MED ORDER — BISACODYL 10 MG RE SUPP
10.0000 mg | Freq: Every day | RECTAL | Status: DC | PRN
  Administered 2011-06-05: 10 mg via RECTAL
  Filled 2011-06-04: qty 1

## 2011-06-04 MED ORDER — POLYETHYLENE GLYCOL 3350 17 G PO PACK
17.0000 g | PACK | Freq: Every day | ORAL | Status: DC
  Administered 2011-06-04 – 2011-06-07 (×4): 17 g via ORAL
  Filled 2011-06-04 (×4): qty 1

## 2011-06-04 NOTE — Consult Note (Signed)
Referring Provider: Dr. Sena Hitch  Primary Care Physician:  Sonda Primes, MD, MD Primary Gastroenterologist:  Dr.Jacobs  Reason for Consultation:   EGD   HPI: Jorge [name] is a 78 y.o. male with history of prostate cancer status post right radical prostatectomy in 2008. He has history of hypertension GERD and colon polyps. 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 Matthias Hughs . Patient had an episode of gastric volvulus in 2008 while he was hospitalized after his prostate surgery and had 2 endoscopies at that time for reduction of gastric volvulus. His last colonoscopy was done by Dr. Christella Hartigan in 2009 and was noted to have diverticulosis as well as 2 polyps which were removed.  Patient says that he is doing very well over the years with his intrathoracic stomach but over the past 18 months has had an increase in symptoms. He has had more problems with early satiety and has decreased his meal intake to about half of his previous intake. Has lost 8 or 9 pounds during that period of time. Gen. he has no problems with dysphagia or odynophagia. He says occasionally he does get some upper abdominal discomfort which will last for an hour or so and then resolved. This past Thursday he had a more severe episode which lasted for several hours did not resolve and he therefore came on to the emergency room. This pain was located in his left upper quadrant and radiated into his chest. He describes it as severe. He has also had some increased problems with constipation recently as well.  On evaluation in the emergency room with imaging he was found to have a very large paraesophageal hernia with an intrathoracic stomach but no evidence of gastric volvulus.  Since that admission his pain has resolved and he is tolerating clear liquids without any difficulty and is hungry. He has been seen by both general surgery and thoracic  surgery and plans are being made for a combined repair next week. We are asked to see him for preoperative upper endoscopy to assess his anatomy and assure no other intra gastric lesions.   Past Medical History  Diagnosis Date  . Anxiety   . GERD (gastroesophageal reflux disease)   . Hypertension   . Gastric ulcer     Dr. Christella Hartigan  . ED (erectile dysfunction)   . History of colonic polyps   . Prostate cancer 2010    Dr. Lenn Sink  . Vitamin d deficiency   . Insomnia   . Hernia, hiatal   . Syncope 2011    from Bicalutamide    Past Surgical History  Procedure Date  . Transurethral resection of prostate     Prior to Admission medications   Medication Sig Start Date End Date Taking? Authorizing Provider  cholecalciferol (VITAMIN D) 1000 UNITS tablet Take 1 tablet (1,000 Units total) by mouth daily. 07/19/10  Yes Alex [name], MD  diphenhydramine-acetaminophen (TYLENOL PM) 25-500 MG TABS Take 1 tablet by mouth at bedtime as needed. For sleep   Yes Historical Provider, MD  Oxycodone HCl 10 MG TABS Take 1 tablet (10 mg total) by mouth 4 (four) times daily as needed (pain). 04/22/11  Yes Sonda Primes, MD  Simethicone (GAS-X EXTRA STRENGTH) 125 MG CAPS Take 1 capsule (125 mg total) by mouth 2 (two) times daily as needed (bloating and gas). 03/15/11  Yes Rene Paci, MD  terazosin (HYTRIN) 2 MG  capsule Take 1 capsule (2 mg total) by mouth at bedtime. 02/14/11  Yes Sonda Primes, MD    Current Facility-Administered Medications  Medication Dose Route Frequency Provider Last Rate Last Dose  . bisacodyl (DULCOLAX) suppository 10 mg  10 mg Rectal Daily PRN Velora Heckler, MD      . dextrose 5 % and 0.9 % NaCl with KCl 20 mEq/L infusion   Intravenous Continuous Liz Malady, MD 100 mL/hr at 06/04/11 [phone]    . diphenhydrAMINE (BENADRYL) injection 12.5 mg  12.5 mg Intravenous Q6H PRN Liz Malady, MD       Or  . diphenhydrAMINE (BENADRYL) 12.5 MG/5ML elixir 12.5 mg  12.5 mg Oral  Q6H PRN Liz Malady, MD      . HYDROmorphone (DILAUDID) injection 1 mg  1 mg Intravenous Q2H PRN Liz Malady, MD   1 mg at 06/03/11 2221  . ondansetron (ZOFRAN) injection 4 mg  4 mg Intravenous Q6H PRN Liz Malady, MD      . pantoprazole (PROTONIX) injection 40 mg  40 mg Intravenous QHS Liz Malady, MD   40 mg at 06/03/11 2216  . polyethylene glycol (MIRALAX / GLYCOLAX) packet 17 g  17 g Oral Daily Velora Heckler, MD   17 g at 06/04/11 1206    Allergies as of 06/02/2011 - Review Complete 06/02/2011  Allergen Reaction Noted  . Aspirin    . Azithromycin    . Cefuroxime axetil    . Iodine    . Naproxen  04/28/2010  . Penicillins  06/02/2011    Family History  Problem Relation Age of Onset  . Pancreatic cancer Father   . Hypertension Father   . Hypertension Other     History   Social History  . Marital Status: Married    Spouse Name: N/A    Number of Children: N/A  . Years of Education: N/A   Occupational History  . retired    Social History Main Topics  . Smoking status: Former Games developer  . Smokeless tobacco: Not on file  . Alcohol Use: Yes  . Drug Use: No  . Sexually Active: Yes   Other Topics Concern  . Not on file   Social History Narrative   Regular exercise- yes/golf   ROS: Pertinent positive and negative review of systems were noted in the above HPI section.  All other review of systems was otherwise negative.[name] Physical Exam: Vital signs in last 24 hours: Temp:  [97.8 F (36.6 C)-98.9 F (37.2 C)] 98 F (36.7 C) (03/02 1140) Pulse Rate:  [56-76] 65  (03/02 1140) Resp:  [12-19] 18  (03/02 1140) BP: (107-149)/(64-87) 149/87 mmHg (03/02 1140) SpO2:  [94 %-97 %] 96 % (03/02 1140) Last BM Date: 06/01/11 General:   Alert,  Well-developed, well-nourished, pleasant and cooperative in NAD Head:  Normocephalic and atraumatic. Eyes:  Sclera clear, no icterus.   Conjunctiva pink. Ears:  Normal auditory acuity. Nose:  No deformity, discharge,  or  lesions. Mouth:  No deformity or lesions.   Neck:  Supple; no masses or thyromegaly. Lungs:  Clear throughout to auscultation.   No wheezes, crackles, or rhonchi. Heart:  Regular rate and rhythm; +s3 Abdomen:  Soft,nontender, BS active,nonpalp mass or hsm.   Rectal:  Deferred  Msk:  Symmetrical without gross deformities. . Pulses:  Normal pulses noted. Extremities:  Without clubbing or edema. Neurologic:  Alert and  oriented x4;  grossly normal neurologically. Skin:  Intact without significant lesions  or rashes.. Psych:  Alert and cooperative. Normal mood and affect.  Intake/Output from previous day: 03/01 0701 - 03/02 0700 In: 1240 [P.O.:240; I.V.:1000] Out: 2050 [Urine:2050] Intake/Output this shift: Total I/O In: 500 [I.V.:500] Out: 1325 [Urine:1325]  Lab Results:  Basename 06/02/11 1330  WBC 7.0  HGB 14.9  HCT 43.9  PLT 191   BMET  Basename 06/03/11 0430 06/02/11 1330  NA 138 141  K 4.2 3.9  CL 105 105  CO2 28 27  GLUCOSE 127* 138*  BUN 10 12  CREATININE 0.93 0.87  CALCIUM 9.0 9.7   LFT  Basename 06/03/11 0430  PROT 5.9*  ALBUMIN 3.2*  AST 17  ALT 11  ALKPHOS 75  BILITOT 0.3  BILIDIR --  IBILI --   PT/INR  Basename 06/03/11 0430  LABPROT 14.0  INR 1.06     Studies/Results: Ct Abdomen Pelvis Wo Contrast  06/02/2011  *RADIOLOGY REPORT*  Clinical Data: Abdominal pain.  CT ABDOMEN AND PELVIS WITHOUT CONTRAST  Technique:  Multidetector CT imaging of the abdomen and pelvis was performed following the standard protocol without intravenous contrast.  Comparison: CT scan dated 04/22/2011  Findings: Again noted is a huge hiatal hernia with or general axilla volvulus.  There is a functional obstruction of the distal stomach at the level of the diaphragm.  The stomach is more distended than on the prior study has marked mass effect upon the adjacent heart and other mediastinal structures.  The compression of the heart has increased since the prior exam.  The  esophagus is not dilated.  A portion of the transverse colon is herniated through the diaphragmatic defect.  The small bowel herniation seen on the prior study as well as the pancreatic herniations seen on the prior study are not present on the current exam.  The liver, spleen, pancreas, adrenal glands, and kidneys demonstrate no significant abnormalities.  There are multiple gallstones in the nondistended gallbladder.  No bile duct dilatation.  Scattered diverticula in the colon.  The colon is quite redundant. Inguinal hernias containing only fat.  IMPRESSION: Huge hiatal hernia with the majority of the stomach in the chest. There is marked increased distention of the stomach with a functional obstruction of the distal stomach at the level of the diaphragmatic defect.  The stomach is distended to over 17 cm in diameter.  The markedly distended stomach is having a significant mass effect upon the heart and other mediastinal structures, more so than on the prior exams.  Original Report Authenticated By: Gwynn Burly, M.D.   Dg Chest Port 1 View  06/03/2011  *RADIOLOGY REPORT*  Clinical Data: Hiatal hernia.  PORTABLE CHEST - 1 VIEW  Comparison: 04/19/2009  Findings: Large hiatal hernia again noted.  Stable marked elevation of the left hemidiaphragm.  Areas of atelectasis and/or scarring bilaterally.  Rule probable cardiomegaly.  IMPRESSION: No interval change.  Original Report Authenticated By: Cyndie Chime, M.D.    IMPRESSION:  #75 78 year old male with huge paraesophageal hernia and intrathoracic stomach- chronic and present at least back to 1996, now presenting with severe epigastric and chest pain and CT findings of a functional obstruction of the distal stomach at the diaphragmatic defect. Since admission his symptoms have resolved. He has been having intermittent obstructive symptoms over the past 18 months. Plan is for combined Gen. surgical and thoracic surgical repair and upper endoscopy is  requested preoperatively to assess his anatomy and rule out any intragastric lesion  PLAN: #1 We'll schedule for upper endoscopy  with Dr. Leone Payor in a.m. tomorrow 06/05/2011. I discussed with the patient and he is agreeable to proceed. #2 advance his diet to full liquids. Thank you for the consult   Amy Monica Becton  06/04/2011, 12:23 PM      [facility] MD  Asked to perform pre-op EGD in setting of intrathoracic stomach with plans for reduction and repair. Planned for tomorrow. The risks and benefits as well as alternatives of endoscopic procedure(s) have been discussed and reviewed. All questions answered. The patient agrees to proceed.   Iva Boop, MD, [facility] [phone] (pager) 06/04/2011 5:43 PM

## 2011-06-04 NOTE — Progress Notes (Signed)
Patient ID: Jorge [name], male   DOB: 06-Apr-1929, 78 y.o.   MRN: [id number]  General Surgery - [facility] - Progress Note  Subjective: Patient in stepdown unit.  Mild pain with deep inspiration.  Constipated.  Wife at bedside.  Objective: Vital signs in last 24 hours: Temp:  [97.8 F (36.6 C)-98.9 F (37.2 C)] 97.8 F (36.6 C) (03/02 0730) Pulse Rate:  [56-76] 61  (03/02 0355) Resp:  [12-19] 16  (03/02 0355) BP: (107-159)/(64-90) 108/68 mmHg (03/02 0355) SpO2:  [94 %-97 %] 97 % (03/02 0355) Last BM Date: 06/01/11  Intake/Output from previous day: 03/01 0701 - 03/02 0700 In: 1240 [P.O.:240; I.V.:1000] Out: 2050 [Urine:2050]  Exam: HEENT - clear, not icteric Neck - soft Chest - clear bilaterally Cor - RRR, no murmur Abd - soft without distension; no tenderness; no mass; few BS present Ext - no significant edema Neuro - grossly intact, no focal deficits  Lab Results:   Basename 06/02/11 1330  WBC 7.0  HGB 14.9  HCT 43.9  PLT 191     Basename 06/03/11 0430 06/02/11 1330  NA 138 141  K 4.2 3.9  CL 105 105  CO2 28 27  GLUCOSE 127* 138*  BUN 10 12  CREATININE 0.93 0.87  CALCIUM 9.0 9.7    Studies/Results: Ct Abdomen Pelvis Wo Contrast  06/02/2011  *RADIOLOGY REPORT*  Clinical Data: Abdominal pain.  CT ABDOMEN AND PELVIS WITHOUT CONTRAST  Technique:  Multidetector CT imaging of the abdomen and pelvis was performed following the standard protocol without intravenous contrast.  Comparison: CT scan dated 04/22/2011  Findings: Again noted is a huge hiatal hernia with or general axilla volvulus.  There is a functional obstruction of the distal stomach at the level of the diaphragm.  The stomach is more distended than on the prior study has marked mass effect upon the adjacent heart and other mediastinal structures.  The compression of the heart has increased since the prior exam.  The esophagus is not dilated.  A portion of the transverse colon is  herniated through the diaphragmatic defect.  The small bowel herniation seen on the prior study as well as the pancreatic herniations seen on the prior study are not present on the current exam.  The liver, spleen, pancreas, adrenal glands, and kidneys demonstrate no significant abnormalities.  There are multiple gallstones in the nondistended gallbladder.  No bile duct dilatation.  Scattered diverticula in the colon.  The colon is quite redundant. Inguinal hernias containing only fat.  IMPRESSION: Huge hiatal hernia with the majority of the stomach in the chest. There is marked increased distention of the stomach with a functional obstruction of the distal stomach at the level of the diaphragmatic defect.  The stomach is distended to over 17 cm in diameter.  The markedly distended stomach is having a significant mass effect upon the heart and other mediastinal structures, more so than on the prior exams.  Original Report Authenticated By: Gwynn Burly, M.D.   Dg Chest Port 1 View  06/03/2011  *RADIOLOGY REPORT*  Clinical Data: Hiatal hernia.  PORTABLE CHEST - 1 VIEW  Comparison: 04/19/2009  Findings: Large hiatal hernia again noted.  Stable marked elevation of the left hemidiaphragm.  Areas of atelectasis and/or scarring bilaterally.  Rule probable cardiomegaly.  IMPRESSION: No interval change.  Original Report Authenticated By: Cyndie Chime, M.D.    Assessment: Giant hiatal hernia with partial obstructive symptoms  Plan: Appreciate evaluation by Dr. Ofilia Neas from  thoracic surgery Have forwarded notes and studies to Dr. Wenda Low for review Requested consultation and EGD from [facility] - Dr. Stan Head Discussed plans for surgical repair with patient and his wife at bedside.  Dr. Daphine Deutscher likely to see patient in consultation on Monday, 3/4. Begin Miralax, offer suppository as needed  Velora Heckler, MD, [facility] Office: [phone]  06/04/2011

## 2011-06-05 ENCOUNTER — Other Ambulatory Visit: Payer: Self-pay

## 2011-06-05 ENCOUNTER — Encounter (HOSPITAL_COMMUNITY): Payer: Self-pay

## 2011-06-05 ENCOUNTER — Encounter (HOSPITAL_COMMUNITY): Admission: EM | Disposition: A | Discharge status: Home / Self Care | Payer: Self-pay | Source: Home / Self Care

## 2011-06-05 DIAGNOSIS — R933 Abnormal findings on diagnostic imaging of other parts of digestive tract: Secondary | ICD-10-CM | POA: Diagnosis present

## 2011-06-05 DIAGNOSIS — K440 Diaphragmatic hernia with obstruction, without gangrene: Secondary | ICD-10-CM | POA: Diagnosis not present

## 2011-06-05 DIAGNOSIS — K449 Diaphragmatic hernia without obstruction or gangrene: Secondary | ICD-10-CM | POA: Diagnosis not present

## 2011-06-05 DIAGNOSIS — K2970 Gastritis, unspecified, without bleeding: Secondary | ICD-10-CM | POA: Diagnosis not present

## 2011-06-05 DIAGNOSIS — K5289 Other specified noninfective gastroenteritis and colitis: Secondary | ICD-10-CM | POA: Diagnosis not present

## 2011-06-05 HISTORY — PX: ESOPHAGOGASTRODUODENOSCOPY: SHX5428

## 2011-06-05 SURGERY — EGD (ESOPHAGOGASTRODUODENOSCOPY)
Anesthesia: Moderate Sedation

## 2011-06-05 MED ORDER — MIDAZOLAM HCL 10 MG/2ML IJ SOLN
INTRAMUSCULAR | Status: DC | PRN
  Administered 2011-06-05 (×2): 2 mg via INTRAVENOUS

## 2011-06-05 MED ORDER — MIDAZOLAM HCL 10 MG/2ML IJ SOLN
INTRAMUSCULAR | Status: AC
Start: 2011-06-05 — End: 2011-06-05
  Filled 2011-06-05: qty 2

## 2011-06-05 MED ORDER — FENTANYL NICU IV SYRINGE 50 MCG/ML
INJECTION | INTRAMUSCULAR | Status: DC | PRN
  Administered 2011-06-05 (×2): 25 ug via INTRAVENOUS

## 2011-06-05 MED ORDER — BUTAMBEN-TETRACAINE-BENZOCAINE 2-2-14 % EX AERO
INHALATION_SPRAY | CUTANEOUS | Status: DC | PRN
  Administered 2011-06-05: 2 via TOPICAL

## 2011-06-05 MED ORDER — FENTANYL CITRATE 0.05 MG/ML IJ SOLN
INTRAMUSCULAR | Status: AC
  Filled 2011-06-05: qty 2

## 2011-06-05 MED ORDER — DIPHENHYDRAMINE HCL 50 MG/ML IJ SOLN
INTRAMUSCULAR | Status: AC
  Filled 2011-06-05: qty 1

## 2011-06-05 NOTE — Progress Notes (Signed)
[facility] ([facility]) - ATTENDING:  Met with wife in stepdown unit this morning to discuss management plans.  Reviewed EGD report of Dr. Leone Payor.  Continue to monitor patient closely with narcotics for pain control.  Operative planning by Drs. Tyrone Sage and Daphine Deutscher. Velora Heckler, MD, [facility] Office: [phone]

## 2011-06-05 NOTE — Progress Notes (Signed)
Day of Surgery  Subjective: Pt ok. Back from Upper Endo-report/findings reviewed. No new c/o today. Denies pain. No BM yet but just got suppository  Objective: Vital signs in last 24 hours: Temp:  [97.1 F (36.2 C)-98.6 F (37 C)] 97.9 F (36.6 C) (03/03 1118) Pulse Rate:  [64-86] 64  (03/03 0300) Resp:  [14-21] 17  (03/03 1118) BP: (125-186)/(68-100) 138/79 mmHg (03/03 1118) SpO2:  [92 %-97 %] 94 % (03/03 1118) Last BM Date: 06/29/11  Intake/Output this shift: Total I/O In: 200 [I.V.:200] Out: 475 [Urine:475]  Physical Exam: BP 138/79  Pulse 64  Temp(Src) 97.9 F (36.6 C) (Oral)  Resp 17  Ht 6\' 1"  (1.854 m)  Wt 89.8 kg (197 lb 15.6 oz)  BMI 26.12 kg/m2  SpO2 94% Abdomen: soft, ND, NT  Active BS Labs: CBC  Basename 06/02/11 1330  WBC 7.0  HGB 14.9  HCT 43.9  PLT 191   BMET  Basename 06/03/11 0430 06/02/11 1330  NA 138 141  K 4.2 3.9  CL 105 105  CO2 28 27  GLUCOSE 127* 138*  BUN 10 12  CREATININE 0.93 0.87  CALCIUM 9.0 9.7   LFT  Basename 06/03/11 0430 06/02/11 1330  PROT 5.9* --  ALBUMIN 3.2* --  AST 17 --  ALT 11 --  ALKPHOS 75 --  BILITOT 0.3 --  BILIDIR -- --  IBILI -- --  LIPASE -- 25   PT/INR  Basename 06/03/11 0430  LABPROT 14.0  INR 1.06   ABG No results found for this basename: PHART:2,PCO2:2,PO2:2,HCO3:2 in the last 72 hours  Studies/Results: No results found.  Assessment: Giant hiatal hernia with partial obstructive symptoms   Procedure(s): ESOPHAGOGASTRODUODENOSCOPY (EGD)  Plan: Appreciate evaluation by Dr. Ofilia Neas from thoracic surgery  Have forwarded notes and studies to Dr. Wenda Low for review. Appreciate GI eval, biopsies pending. Discussed plans for surgical repair with patient and his wife at bedside. Dr. Daphine Deutscher likely to see patient in consultation on Monday, 3/4.  Begin Miralax, offer suppository as needed.   LOS: 3 days    Alyse Low 06/05/2011 11:29 AM

## 2011-06-05 NOTE — Op Note (Signed)
[facility] [address] [place], [place]  [address]  ENDOSCOPY PROCEDURE REPORT  PATIENT:  Jorge [name], Jorge [name]  MR#:  [id number] BIRTHDATE:  [birth year], 81 yrs. old  GENDER:  male  ENDOSCOPIST:  Iva Boop, MD, [facility] Referred by:  Sheliah Plane, M.D. Darnell Level, M.D.  PROCEDURE DATE:  06/05/2011 PROCEDURE:  EGD with biopsy, [id number] ASA CLASS:  Class III INDICATIONS:  evaluate intrathoracic stomach, pre-op (inpatient)  MEDICATIONS:   Fentanyl 50 mcg IV, Versed 4 mg IV TOPICAL ANESTHETIC:  Cetacaine Spray  DESCRIPTION OF PROCEDURE:   After the risks benefits and alternatives of the procedure were thoroughly explained, informed consent was obtained.  The Pentax Gastroscope [id number] endoscope was introduced through the mouth and advanced to the second portion of the duodenum, limited by  distorted anatomy  The instrument was slowly withdrawn as the mucosa was fully examined. <<PROCEDUREIMAGES>>  It as tortuous in the total esophagus.  A dilatation was found in the total stomach. Distorted anatomy and dilation throughout.  500 cc retained fluid removed. Erythema was found in the antrum. Proximal antrum very erythematous. Multiple biopsies were obtained and sent to pathology.  Nodular mucosa was found in the antrum. Distal antrum. Multiple biopsies were obtained and sent to pathology.  Otherwise the examination was normal.    Retroflexed views revealed no abnormalities.    The scope was then withdrawn from the patient and the procedure completed.  COMPLICATIONS:  None  ENDOSCOPIC IMPRESSION: 1) Tortuous in the total esophagus 2) Dilatation in the total stomach 3) Erythema in the antrum - biopsied 4) Nodular mucosa in the distal antrum - biopsied 5) Otherwise normal examination RECOMMENDATIONS: Suspect the mucosal changes are inflammatory but biopsied to ensure. Suspect no contraindication to surgery here.  Iva Boop, MD, [facility]  n. eSIGNED:    Iva Boop at 06/05/2011 11:01 AM  Loney Laurence, [phone]

## 2011-06-05 NOTE — Progress Notes (Signed)
At 0516 and 0517 patient had two pauses lasting 2 seconds a piece. Patient asymptomatic with no chest pain and HR 50's to 60's. BP 140/75. Dr Magnus Ivan made aware and will obtain stat EKG. Will continue to monitor patient.

## 2011-06-06 ENCOUNTER — Encounter (HOSPITAL_COMMUNITY): Payer: Self-pay | Admitting: Internal Medicine

## 2011-06-06 DIAGNOSIS — K449 Diaphragmatic hernia without obstruction or gangrene: Secondary | ICD-10-CM | POA: Diagnosis not present

## 2011-06-06 DIAGNOSIS — R1012 Left upper quadrant pain: Secondary | ICD-10-CM | POA: Diagnosis not present

## 2011-06-06 DIAGNOSIS — R072 Precordial pain: Secondary | ICD-10-CM | POA: Diagnosis not present

## 2011-06-06 MED ORDER — CLONIDINE HCL 0.1 MG PO TABS
0.1000 mg | ORAL_TABLET | Freq: Four times a day (QID) | ORAL | Status: DC | PRN
  Filled 2011-06-06: qty 1

## 2011-06-06 MED ORDER — TERAZOSIN HCL 2 MG PO CAPS
2.0000 mg | ORAL_CAPSULE | Freq: Every day | ORAL | Status: DC
  Administered 2011-06-06: 2 mg via ORAL
  Filled 2011-06-06 (×2): qty 1

## 2011-06-06 NOTE — Progress Notes (Signed)
Will speak with patient about upcoming surgery.  Dr. Daphine Deutscher will see in near future.  Marta Lamas. Gae Bon, MD, FACS [phone] [facility]

## 2011-06-06 NOTE — Progress Notes (Signed)
Patient ID: Jorge [name], male   DOB: 08-10-1929, 78 y.o.   MRN: [id number] [facility] DAILY PROGRESS NOTE                   [address]            [place] [address]          [phone]      1 Day Post-Op Procedure(s) (LRB): ESOPHAGOGASTRODUODENOSCOPY (EGD) (N/A)  Total Length of Stay:  LOS: 4 days   Subjective: Felt better after stomach decompression yesterday, Endoscopy results reviewed, still some left lower abdominal discomfort, loose stools today after ducolax  Objective: Vital signs in last 24 hours: Temp:  [97.1 F (36.2 C)-98.7 F (37.1 C)] 98.4 F (36.9 C) (03/04 0800) Pulse Rate:  [63-82] 63  (03/04 0800) Cardiac Rhythm:  [-] Heart block (03/04 0800) Resp:  [14-23] 17  (03/04 0800) BP: (126-186)/(68-97) 153/88 mmHg (03/04 0800) SpO2:  [90 %-96 %] 94 % (03/04 0800)  Filed Weights   06/02/11 2125  Weight: 197 lb 15.6 oz (89.8 kg)    Weight change:    Hemodynamic parameters for last 24 hours:    Intake/Output from previous day: 03/03 0701 - 03/04 0700 In: 3200 [P.O.:1100; I.V.:2100] Out: 2525 [Urine:2525]  Intake/Output this shift:    Current Meds: Scheduled Meds:   . pantoprazole (PROTONIX) IV  40 mg Intravenous QHS  . polyethylene glycol  17 g Oral Daily   Continuous Infusions:   . dextrose 5 % and 0.9 % NaCl with KCl 20 mEq/L 100 mL/hr at 06/05/11 2156   PRN Meds:.bisacodyl, diphenhydrAMINE, diphenhydrAMINE, HYDROmorphone, ondansetron, DISCONTD: butamben-tetracaine-benzocaine, DISCONTD: fentaNYL, DISCONTD: midazolam  General appearance: alert, cooperative and no distress Neurologic: intact Heart: regular rate and rhythm, S1, S2 normal, no murmur, click, rub or gallop Lungs: clear to auscultation bilaterally Abdomen: soft, non-tender; bowel sounds normal; no masses,  no organomegaly  Lab Results: Lab Results  Component Value Date   WBC 7.0 06/02/2011   HGB 14.9 06/02/2011   HCT 43.9 06/02/2011   PLT 191 06/02/2011   GLUCOSE  127* 06/03/2011   CHOL 167 01/10/2007   TRIG 89 01/10/2007   HDL 47.7 01/10/2007   LDLCALC 102* 01/10/2007   ALT 11 06/03/2011   AST 17 06/03/2011   NA 138 06/03/2011   K 4.2 06/03/2011   CL 105 06/03/2011   CREATININE 0.93 06/03/2011   BUN 10 06/03/2011   CO2 28 06/03/2011   TSH 1.41 11/19/2009   PSA 0.46 01/10/2007   INR 1.06 06/03/2011   HGBA1C 6.1* 01/10/2007   Ct Abdomen Pelvis Wo Contrast  06/02/2011  *RADIOLOGY REPORT*  Clinical Data: Abdominal pain.  CT ABDOMEN AND PELVIS WITHOUT CONTRAST  Technique:  Multidetector CT imaging of the abdomen and pelvis was performed following the standard protocol without intravenous contrast.  Comparison: CT scan dated 04/22/2011  Findings: Again noted is a huge hiatal hernia with or general axilla volvulus.  There is a functional obstruction of the distal stomach at the level of the diaphragm.  The stomach is more distended than on the prior study has marked mass effect upon the adjacent heart and other mediastinal structures.  The compression of the heart has increased since the prior exam.  The esophagus is not dilated.  A portion of the transverse colon is herniated through the diaphragmatic defect.  The small bowel herniation seen on the prior study as well as the pancreatic herniations seen on the prior study are not  present on the current exam.  The liver, spleen, pancreas, adrenal glands, and kidneys demonstrate no significant abnormalities.  There are multiple gallstones in the nondistended gallbladder.  No bile duct dilatation.  Scattered diverticula in the colon.  The colon is quite redundant. Inguinal hernias containing only fat.  IMPRESSION: Huge hiatal hernia with the majority of the stomach in the chest. There is marked increased distention of the stomach with a functional obstruction of the distal stomach at the level of the diaphragmatic defect.  The stomach is distended to over 17 cm in diameter.  The markedly distended stomach is having a significant mass effect  upon the heart and other mediastinal structures, more so than on the prior exams.  Original Report Authenticated By: Gwynn Burly, M.D.   Dg Chest Port 1 View  06/03/2011  *RADIOLOGY REPORT*  Clinical Data: Hiatal hernia.  PORTABLE CHEST - 1 VIEW  Comparison: 04/19/2009  Findings: Large hiatal hernia again noted.  Stable marked elevation of the left hemidiaphragm.  Areas of atelectasis and/or scarring bilaterally.  Rule probable cardiomegaly.  IMPRESSION: No interval change.  Original Report Authenticated By: Cyndie Chime, M.D.   Assessment/Plan: S/P Procedure(s) (LRB): ESOPHAGOGASTRODUODENOSCOPY (EGD) (N/A) Gall stones are present Have discussed with Dr Daphine Deutscher, Plan abdominal approach to repair with chest surgery if unable to reduce, Poss WED     [name]  Beeper [phone] Office [phone] 06/06/2011 9:20 AM

## 2011-06-06 NOTE — Progress Notes (Signed)
  Echocardiogram 2D Echocardiogram has been performed.  Jorge [name] Jorge [name] 06/06/2011, 10:17 AM

## 2011-06-06 NOTE — Progress Notes (Signed)
Patient ID: Jorge [name], male   DOB: 1929-11-20, 78 y.o.   MRN: [id number] 1 Day Post-Op  Subjective: Pt feels ok.  C/o some LLQ abdominal pain that radiates up to left chest.  Tolerating clear liquids.  Objective: Vital signs in last 24 hours: Temp:  [97.1 F (36.2 C)-98.7 F (37.1 C)] 98.4 F (36.9 C) (03/04 0800) Pulse Rate:  [63-82] 63  (03/04 0800) Resp:  [14-23] 17  (03/04 0800) BP: (126-167)/(68-96) 153/88 mmHg (03/04 0800) SpO2:  [90 %-96 %] 94 % (03/04 0800) Last BM Date: 06/05/11  Intake/Output from previous day: 03/03 0701 - 03/04 0700 In: 3300 [P.O.:1100; I.V.:2200] Out: 2700 [Urine:2700] Intake/Output this shift: Total I/O In: 300 [I.V.:300] Out: 350 [Urine:350]  PE: Abd: soft, minimal tenderness in LLQ, +BS, ND Heart: regular Lungs: CTAB  Lab Results:  No results found for this basename: WBC:2,HGB:2,HCT:2,PLT:2 in the last 72 hours BMET No results found for this basename: NA:2,K:2,CL:2,CO2:2,GLUCOSE:2,BUN:2,CREATININE:2,CALCIUM:2 in the last 72 hours PT/INR No results found for this basename: LABPROT:2,INR:2 in the last 72 hours   Studies/Results: No results found.  Anti-infectives: Anti-infectives    None       Assessment/Plan  1. Huge hiatal hernia  Plan: 1. Dr. Daphine Deutscher, I believe, is to see the patient today and discuss repair of this hernia. 2. Pt is stable and will tx to a floor. 3. Cont clear liquids.  LOS: 4 days    Perel Hauschild E 06/06/2011

## 2011-06-06 NOTE — Progress Notes (Signed)
Patient ID: Jorge [name], male   DOB: 11/11/1929, 78 y.o.   MRN: [id number] ERON STAAT 78 y.o.  Body mass index is 26.12 kg/(m^2).  Patient Active Problem List  Diagnoses  . VITAMIN D DEFICIENCY  . ANXIETY  . INSOMNIA, PERSISTENT  . CERUMEN IMPACTION  . HYPERTENSION  . GERD  . PEPTIC ULCER DISEASE  . BENIGN PROSTATIC HYPERTROPHY  . ERECTILE DYSFUNCTION  . Diarrhea  . CERVICAL STRAIN  . PERSONAL HX COLON CANCER  . PROSTATE CANCER, HX OF  . COLONIC POLYPS, HX OF  . HIP PAIN  . Neoplasm of uncertain behavior of skin  . LUQ abdominal pain  . Constipation  . Chest pain, atypical  . Irreducible hiatal hernia  . Nonspecific (abnormal) findings on radiological and other examination of gastrointestinal tract    Allergies  Allergen Reactions  . Aspirin   . Azithromycin   . Cefuroxime Axetil   . Iodine     Per pt tremors and dyspnea  . Naproxen     REACTION: upset stomach - like with other NSAIDs  . Penicillins     Pt reports allergy to penicillins     Past Surgical History  Procedure Date  . Transurethral resection of prostate   . Appendectomy     age 28  . Esophagogastroduodenoscopy 06/05/2011    Procedure: ESOPHAGOGASTRODUODENOSCOPY (EGD);  Surgeon: Iva Boop, MD;  Location: [facility] ENDOSCOPY;  Service: Endoscopy;  Laterality: N/A;   Sonda Primes, MD, MD 1. Hiatal hernia   2. Irreducible hiatal hernia     I discussed Mr. Niehoff with Dr. Tyrone Sage.  We want to try to reduce his viscera through an abdominal approach but be prepared to go into the chest if necessary.  Our offices are working to get this on for this Friday.  I discussed the approach with Mr. And Mrs. Earl Lites.  He is in good shape for his age but this Type IV hiatal hernia has gotten symptomatic to the point where we will need to address this surgically.  They are aware of the risks and the limitations of the procedure.  Mr. Stanco has reviewed some of the material that we gave him back in 2007 when  he was seen in our office.    Plan: Lapaorscopic approach to large type IV hiatal hernia with possible transthoracic repair if necessary.   Matt B. Daphine Deutscher, MD, [facility] [phone] beeper [phone]  06/06/2011 6:59 PM

## 2011-06-07 DIAGNOSIS — R1012 Left upper quadrant pain: Secondary | ICD-10-CM | POA: Diagnosis not present

## 2011-06-07 DIAGNOSIS — K449 Diaphragmatic hernia without obstruction or gangrene: Secondary | ICD-10-CM | POA: Diagnosis not present

## 2011-06-07 NOTE — Discharge Summary (Signed)
Physician Discharge Summary  Patient ID: Jorge [name] MRN: [id number] DOB/AGE: 1929/04/08 78 y.o.  Admit date: 06/02/2011 Discharge date: 06/07/2011  Admission Diagnoses: Large Hiatal hernia Abdominal pain Discharge Diagnoses:  Large Hiatal hernia Abdominal pain   Procedures: Procedure(s): ESOPHAGOGASTRODUODENOSCOPY (EGD)  Discharged Condition: good  Hospital Course: HPI:  78 year old Caucasian male who has a known history of a large hiatal hernia comes into the emergency room complaining of acute onset of left upper quadrant pain earlier this morning. The patient was doing well this morning. He ate a normal breakfast and then went outside to wash his dog. He then developed acute onset of severe left upper quadrant pain radiating to his chest. He was very severe and sharp. He has had similar episodes in the past; however, this attack was much more severe. He called 911 and was transported to the emergency room. He also had some nausea during this time. He has had previous episodes of this type of pain in the past. He states that he is having these episodes much more frequently. He went to see his primary care physician in January and February for similar symptoms. He has had this hiatal hernia for over 5 years. He also has problems with constipation. Normally he has a bowel movement about every 2 days; however, on occasion he will go 5-7 days without having a bowel movement. He denies any fevers, chills, vomiting, melena, hematochezia, shortness of breath. He states that he does get winded with some exertion. However he states he is able to walk around in the grocery store without problems. He does sleep with 2 pillows behind his head. He states that if he lays flat he will get short of breath. His last bowel movement was 2 days ago. He reports that he is having flatus.  Workup showed large hiatal hernia with intrathoracic stomach causing mass effects on thoracic organs.  Pt was seen and  admitted by Dr. Andrey Campanile and transferred to [facility] hospital so that Thoracic Surgery consult could be obtained. Dr. Tyrone Sage saw the pt and requested an EGD to eval for gastric masses or ulcerations. Dr. Leone Payor proceeded with this and results noted. Dr. Daphine Deutscher had previously seen the pt but surgery was never carried out. Dr. Tyrone Sage and Dr. Daphine Deutscher discussed the case and agreed that it could be done and scheduled it for 3/8. He had an echo while in the hospital.  His sxs of pain and mild nausea resolved during his hospital stay. He was tolerating clear liquids and c/o no pain or nausea. His surgery was coordinated and scheduled and he is stable for discharge.  Consults: GI and Thoracic Surgery   Discharge Exam: Blood pressure 147/80, pulse 70, temperature 98 F (36.7 C), temperature source Oral, resp. rate 18, height 6\' 1"  (1.854 m), weight 89.8 kg (197 lb 15.6 oz), SpO2 97.00%. General appearance: alert, cooperative, appears stated age and no distress Resp: clear to auscultation bilaterally Cardio: regular rate and rhythm, S1, S2 normal, no murmur, click, rub or gallop GI: soft, non-tender; bowel sounds normal; no masses,  no organomegaly  Disposition: To Home  Discharge Orders    Future Appointments: Provider: Department: Dept Phone: Center:   09/12/2011 10:45 AM Sonda Primes, MD [facility] [phone] [facility]     Future Orders Please Complete By Expires   Diet general      Comments:   Thin liquids only. Soup broth, Jell-O, juices. May also have Boost, Ensure.   Increase activity slowly      Call  MD for:  severe uncontrolled pain      Call MD for:  persistant nausea and vomiting        Medication List  As of 06/07/2011  1:50 PM   TAKE these medications         cholecalciferol 1000 UNITS tablet   Commonly known as: VITAMIN D   Take 1 tablet (1,000 Units total) by mouth daily.      diphenhydramine-acetaminophen 25-500 MG Tabs   Commonly known as: TYLENOL PM   Take 1 tablet by mouth at  bedtime as needed. For sleep      Oxycodone HCl 10 MG Tabs   Take 1 tablet (10 mg total) by mouth 4 (four) times daily as needed (pain).      Simethicone 125 MG Caps   Take 1 capsule (125 mg total) by mouth 2 (two) times daily as needed (bloating and gas).      terazosin 2 MG capsule   Commonly known as: HYTRIN   Take 1 capsule (2 mg total) by mouth at bedtime.           Follow-up Information    Follow up with Sonda Primes, MD .         Signed: Brayton El F PA-C 06/07/2011, 1:50 PM

## 2011-06-07 NOTE — Progress Notes (Signed)
Not sure why the patient has to stay in hospital with its inherent risks until surgery which is planned for Friday.  He can stay on clear liquids until then.  Will discuss with Dr. Tyrone Sage and Dr. Daphine Deutscher.  Marta Lamas. Gae Bon, MD, FACS [phone] [facility]

## 2011-06-07 NOTE — Progress Notes (Signed)
2 Days Post-Op  Subjective: Pt ok. Tol clears, no N/V Dr. Daphine Deutscher an Dr. Tyrone Sage have planned OR for Fri 3/8  Objective: Vital signs in last 24 hours: Temp:  [97.7 F (36.5 C)-99 F (37.2 C)] 97.8 F (36.6 C) (03/05 0559) Pulse Rate:  [65-81] 65  (03/05 0559) Resp:  [17-20] 17  (03/05 0559) BP: (98-171)/(60-91) 125/75 mmHg (03/05 0559) SpO2:  [90 %-97 %] 96 % (03/05 0559) Last BM Date: 06/06/11  Intake/Output this shift:    Physical Exam: BP 125/75  Pulse 65  Temp(Src) 97.8 F (36.6 C) (Oral)  Resp 17  Ht 6\' 1"  (1.854 m)  Wt 89.8 kg (197 lb 15.6 oz)  BMI 26.12 kg/m2  SpO2 96% Abdomne: soft  Labs: CBC No results found for this basename: WBC:2,HGB:2,HCT:2,PLT:2 in the last 72 hours BMET No results found for this basename: NA:2,K:2,CL:2,CO2:2,GLUCOSE:2,BUN:2,CREATININE:2,CALCIUM:2 in the last 72 hours LFT No results found for this basename: PROT,ALBUMIN,AST,ALT,ALKPHOS,BILITOT,BILIDIR,IBILI,LIPASE in the last 72 hours PT/INR No results found for this basename: LABPROT:2,INR:2 in the last 72 hours ABG No results found for this basename: PHART:2,PCO2:2,PO2:2,HCO3:2 in the last 72 hours  Studies/Results: No results found.  Assessment: Active Problems: Hiatal hernia   Procedure(s): ESOPHAGOGASTRODUODENOSCOPY (EGD)  Plan: Planned OR for Fri If all coordinated, can try to DC home today  LOS: 5 days    Alyse Low 06/07/2011 8:54 AM

## 2011-06-07 NOTE — Discharge Instructions (Addendum)
Surgery is scheduled for Friday 06/10/11 at 7:15am You will need to be at the hospital by 5:30am for pre-op process. Please arrive to Short Stay area. Parking located adjacent to ER. Do not eat or drink anything after Midnight Thursday. Call with questions---[phone]

## 2011-06-07 NOTE — Progress Notes (Signed)
Quick Note:  Hospital patient - no letter needed. No recalll ______

## 2011-06-07 NOTE — Progress Notes (Signed)
Quick Note:  Correction - he was discharged. Please call him and let him know biopsies just showed inflammation and will not cause any issue with his surgery ______

## 2011-06-07 NOTE — Plan of Care (Signed)
Problem: Discharge Progression Outcomes Goal: Complications resolved/controlled Outcome: Completed/Met Date Met:  06/07/11 Awaiting surgery, scheduled for Friday 06/10/11

## 2011-06-07 NOTE — Progress Notes (Signed)
Discharge instructions reviewed with pt and pt's wife.  Pt and pt's wife verbalized understanding and questions answered.  Pt discharged in stable condition via wheelchair with wife.  Jorge [name]   

## 2011-06-08 ENCOUNTER — Telehealth (INDEPENDENT_AMBULATORY_CARE_PROVIDER_SITE_OTHER): Payer: Self-pay

## 2011-06-08 ENCOUNTER — Encounter (HOSPITAL_COMMUNITY): Payer: Self-pay | Admitting: Pharmacy Technician

## 2011-06-08 NOTE — Telephone Encounter (Signed)
Per Marisue Ivan at [facility], need orders for surgery put into epic. Surgery 06-10-11.

## 2011-06-08 NOTE — Progress Notes (Addendum)
Spoke with Arline Asp at Dr. Ermalene Searing office, requested orders. Per Dee at [facility] no additional orders needed, Dr. Tyrone Sage assisting.

## 2011-06-08 NOTE — Discharge Summary (Signed)
The patient will be readmitted for surgery on Friday.  Jorge [name]. Gae Bon, MD, FACS [phone] [facility] Surgery

## 2011-06-10 ENCOUNTER — Encounter (HOSPITAL_COMMUNITY): Payer: Self-pay | Admitting: Anesthesiology

## 2011-06-10 ENCOUNTER — Inpatient Hospital Stay (HOSPITAL_COMMUNITY): Payer: Medicare Other | Admitting: Anesthesiology

## 2011-06-10 ENCOUNTER — Inpatient Hospital Stay (HOSPITAL_COMMUNITY)
Admission: RE | Admit: 2011-06-10 | Discharge: 2011-06-15 | DRG: 328 | Disposition: A | Discharge status: Home / Self Care | Payer: Medicare Other | Source: Ambulatory Visit | Attending: Surgery | Admitting: Surgery

## 2011-06-10 ENCOUNTER — Inpatient Hospital Stay (HOSPITAL_COMMUNITY): Payer: Medicare Other

## 2011-06-10 ENCOUNTER — Encounter (HOSPITAL_COMMUNITY): Admission: RE | Disposition: A | Discharge status: Home / Self Care | Payer: Self-pay | Source: Ambulatory Visit

## 2011-06-10 DIAGNOSIS — Z09 Encounter for follow-up examination after completed treatment for conditions other than malignant neoplasm: Secondary | ICD-10-CM | POA: Diagnosis not present

## 2011-06-10 DIAGNOSIS — Z8546 Personal history of malignant neoplasm of prostate: Secondary | ICD-10-CM

## 2011-06-10 DIAGNOSIS — K449 Diaphragmatic hernia without obstruction or gangrene: Secondary | ICD-10-CM

## 2011-06-10 DIAGNOSIS — F411 Generalized anxiety disorder: Secondary | ICD-10-CM | POA: Diagnosis present

## 2011-06-10 DIAGNOSIS — G4700 Insomnia, unspecified: Secondary | ICD-10-CM | POA: Diagnosis present

## 2011-06-10 DIAGNOSIS — Z87891 Personal history of nicotine dependence: Secondary | ICD-10-CM

## 2011-06-10 DIAGNOSIS — R079 Chest pain, unspecified: Secondary | ICD-10-CM | POA: Diagnosis not present

## 2011-06-10 DIAGNOSIS — I10 Essential (primary) hypertension: Secondary | ICD-10-CM | POA: Diagnosis present

## 2011-06-10 DIAGNOSIS — K440 Diaphragmatic hernia with obstruction, without gangrene: Secondary | ICD-10-CM | POA: Diagnosis present

## 2011-06-10 DIAGNOSIS — E559 Vitamin D deficiency, unspecified: Secondary | ICD-10-CM | POA: Diagnosis present

## 2011-06-10 DIAGNOSIS — Z5189 Encounter for other specified aftercare: Secondary | ICD-10-CM | POA: Diagnosis not present

## 2011-06-10 DIAGNOSIS — K219 Gastro-esophageal reflux disease without esophagitis: Secondary | ICD-10-CM | POA: Diagnosis present

## 2011-06-10 DIAGNOSIS — J9819 Other pulmonary collapse: Secondary | ICD-10-CM | POA: Diagnosis not present

## 2011-06-10 DIAGNOSIS — R918 Other nonspecific abnormal finding of lung field: Secondary | ICD-10-CM | POA: Diagnosis not present

## 2011-06-10 DIAGNOSIS — J90 Pleural effusion, not elsewhere classified: Secondary | ICD-10-CM | POA: Diagnosis not present

## 2011-06-10 HISTORY — PX: HIATAL HERNIA REPAIR: SHX195

## 2011-06-10 HISTORY — PX: HERNIA REPAIR: SHX51

## 2011-06-10 LAB — CBC
MCH: 32.2 pg (ref 26.0–34.0)
MCHC: 33.7 g/dL (ref 30.0–36.0)
MCV: 95.5 fL (ref 78.0–100.0)
Platelets: 134 10*3/uL — ABNORMAL LOW (ref 150–400)
RDW: 13.3 % (ref 11.5–15.5)

## 2011-06-10 LAB — ABO/RH: ABO/RH(D): A POS

## 2011-06-10 SURGERY — REPAIR, HERNIA, HIATAL, LAPAROSCOPIC
Anesthesia: General | Site: Abdomen | Wound class: Clean

## 2011-06-10 MED ORDER — ACETAMINOPHEN 10 MG/ML IV SOLN
INTRAVENOUS | Status: DC | PRN
  Administered 2011-06-10: 1000 mg via INTRAVENOUS

## 2011-06-10 MED ORDER — 0.9 % SODIUM CHLORIDE (POUR BTL) OPTIME
TOPICAL | Status: DC | PRN
  Administered 2011-06-10: 1000 mL

## 2011-06-10 MED ORDER — DEXTROSE 5 % IV SOLN
1.0000 g | INTRAVENOUS | Status: DC | PRN
  Administered 2011-06-10: 1 g via INTRAVENOUS

## 2011-06-10 MED ORDER — NEOSTIGMINE METHYLSULFATE 1 MG/ML IJ SOLN
INTRAMUSCULAR | Status: DC | PRN
  Administered 2011-06-10: 5 mg via INTRAVENOUS

## 2011-06-10 MED ORDER — WHITE PETROLATUM GEL
Status: AC
  Administered 2011-06-10: 19:00:00
  Filled 2011-06-10: qty 5

## 2011-06-10 MED ORDER — ALBUMIN HUMAN 5 % IV SOLN
INTRAVENOUS | Status: DC | PRN
  Administered 2011-06-10 (×2): via INTRAVENOUS

## 2011-06-10 MED ORDER — ONDANSETRON HCL 4 MG/2ML IJ SOLN: 4.0000 mg | INTRAMUSCULAR | Status: DC | PRN

## 2011-06-10 MED ORDER — DEXAMETHASONE SODIUM PHOSPHATE 4 MG/ML IJ SOLN
INTRAMUSCULAR | Status: DC | PRN
  Administered 2011-06-10: 8 mg via INTRAVENOUS

## 2011-06-10 MED ORDER — ONDANSETRON HCL 4 MG/2ML IJ SOLN
INTRAMUSCULAR | Status: DC | PRN
  Administered 2011-06-10: 4 mg via INTRAVENOUS

## 2011-06-10 MED ORDER — HYDROMORPHONE HCL PF 1 MG/ML IJ SOLN
INTRAMUSCULAR | Status: AC
  Filled 2011-06-10: qty 1

## 2011-06-10 MED ORDER — BUPIVACAINE LIPOSOME 1.3 % IJ SUSP
20.0000 mL | INTRAMUSCULAR | Status: DC
  Filled 2011-06-10: qty 20

## 2011-06-10 MED ORDER — MIDAZOLAM HCL 5 MG/5ML IJ SOLN
INTRAMUSCULAR | Status: DC | PRN
  Administered 2011-06-10: 2 mg via INTRAVENOUS

## 2011-06-10 MED ORDER — HEPARIN SODIUM (PORCINE) 5000 UNIT/ML IJ SOLN
5000.0000 [IU] | Freq: Three times a day (TID) | INTRAMUSCULAR | Status: DC
  Administered 2011-06-10 – 2011-06-15 (×14): 5000 [IU] via SUBCUTANEOUS
  Filled 2011-06-10 (×17): qty 1

## 2011-06-10 MED ORDER — DEXTROSE 5 % IV SOLN
INTRAVENOUS | Status: AC
  Filled 2011-06-10: qty 1

## 2011-06-10 MED ORDER — KCL IN DEXTROSE-NACL 20-5-0.45 MEQ/L-%-% IV SOLN
INTRAVENOUS | Status: DC
  Administered 2011-06-10 – 2011-06-14 (×7): via INTRAVENOUS
  Filled 2011-06-10 (×14): qty 1000

## 2011-06-10 MED ORDER — FENTANYL CITRATE 0.05 MG/ML IJ SOLN
INTRAMUSCULAR | Status: DC | PRN
  Administered 2011-06-10: 50 ug via INTRAVENOUS
  Administered 2011-06-10: 100 ug via INTRAVENOUS
  Administered 2011-06-10: 50 ug via INTRAVENOUS
  Administered 2011-06-10: 100 ug via INTRAVENOUS
  Administered 2011-06-10 (×2): 50 ug via INTRAVENOUS

## 2011-06-10 MED ORDER — PHENYLEPHRINE HCL 10 MG/ML IJ SOLN
10.0000 mg | INTRAVENOUS | Status: DC | PRN
  Administered 2011-06-10: 20 ug/min via INTRAVENOUS

## 2011-06-10 MED ORDER — GLYCOPYRROLATE 0.2 MG/ML IJ SOLN
INTRAMUSCULAR | Status: DC | PRN
  Administered 2011-06-10: .6 mg via INTRAVENOUS

## 2011-06-10 MED ORDER — MORPHINE SULFATE 2 MG/ML IJ SOLN
2.0000 mg | INTRAMUSCULAR | Status: DC | PRN
  Administered 2011-06-10: 2 mg via INTRAVENOUS
  Administered 2011-06-10: 4 mg via INTRAVENOUS
  Administered 2011-06-10: 6 mg via INTRAVENOUS
  Administered 2011-06-10 – 2011-06-11 (×3): 4 mg via INTRAVENOUS
  Administered 2011-06-11 (×2): 2 mg via INTRAVENOUS
  Administered 2011-06-11: 4 mg via INTRAVENOUS
  Administered 2011-06-11: 6 mg via INTRAVENOUS
  Administered 2011-06-11 (×2): 4 mg via INTRAVENOUS
  Administered 2011-06-12: 2 mg via INTRAVENOUS
  Administered 2011-06-12: 4 mg via INTRAVENOUS
  Administered 2011-06-12 – 2011-06-14 (×3): 2 mg via INTRAVENOUS
  Filled 2011-06-10 (×3): qty 2
  Filled 2011-06-10: qty 1
  Filled 2011-06-10: qty 2
  Filled 2011-06-10 (×2): qty 1
  Filled 2011-06-10: qty 2
  Filled 2011-06-10: qty 3
  Filled 2011-06-10: qty 1
  Filled 2011-06-10 (×2): qty 2
  Filled 2011-06-10: qty 1
  Filled 2011-06-10 (×2): qty 2
  Filled 2011-06-10: qty 1
  Filled 2011-06-10: qty 2

## 2011-06-10 MED ORDER — HYDROMORPHONE HCL PF 1 MG/ML IJ SOLN
0.2500 mg | INTRAMUSCULAR | Status: DC | PRN
  Administered 2011-06-10 (×3): 0.5 mg via INTRAVENOUS

## 2011-06-10 MED ORDER — PROPOFOL 10 MG/ML IV EMUL
INTRAVENOUS | Status: DC | PRN
  Administered 2011-06-10: 110 mg via INTRAVENOUS

## 2011-06-10 MED ORDER — BUPIVACAINE LIPOSOME 1.3 % IJ SUSP
20.0000 mL | INTRAMUSCULAR | Status: AC
  Administered 2011-06-10: 20 mL
  Filled 2011-06-10: qty 20

## 2011-06-10 MED ORDER — LACTATED RINGERS IV SOLN
INTRAVENOUS | Status: DC | PRN
  Administered 2011-06-10 (×2): via INTRAVENOUS

## 2011-06-10 MED ORDER — ROCURONIUM BROMIDE 100 MG/10ML IV SOLN
INTRAVENOUS | Status: DC | PRN
  Administered 2011-06-10: 20 mg via INTRAVENOUS
  Administered 2011-06-10: 50 mg via INTRAVENOUS
  Administered 2011-06-10: 10 mg via INTRAVENOUS
  Administered 2011-06-10: 20 mg via INTRAVENOUS

## 2011-06-10 MED ORDER — BUPIVACAINE-EPINEPHRINE 0.25% -1:200000 IJ SOLN: INTRAMUSCULAR | Status: DC | PRN

## 2011-06-10 MED ORDER — SODIUM CHLORIDE 0.9 % IR SOLN
Status: DC | PRN
  Administered 2011-06-10: 1000 mL

## 2011-06-10 MED ORDER — MORPHINE SULFATE 2 MG/ML IJ SOLN
INTRAMUSCULAR | Status: AC
  Filled 2011-06-10: qty 1

## 2011-06-10 MED ORDER — VECURONIUM BROMIDE 10 MG IV SOLR
INTRAVENOUS | Status: DC | PRN
  Administered 2011-06-10: 2 mg via INTRAVENOUS
  Administered 2011-06-10: 1 mg via INTRAVENOUS

## 2011-06-10 SURGICAL SUPPLY — 92 items
APPLICATOR COTTON TIP 6IN STRL (MISCELLANEOUS) ×3 IMPLANT
APPLIER CLIP ROT 10 11.4 M/L (STAPLE) ×3
BENZOIN TINCTURE PRP APPL 2/3 (GAUZE/BANDAGES/DRESSINGS) IMPLANT
Biodesign Surgisis Hernia Graft ×3 IMPLANT
CANISTER SUCTION 2500CC (MISCELLANEOUS) ×3 IMPLANT
CATH HYDRAGLIDE XL THORACIC (CATHETERS) IMPLANT
CATH THORACIC 28FR (CATHETERS) IMPLANT
CATH THORACIC 36FR (CATHETERS) IMPLANT
CATH THORACIC 36FR RT ANG (CATHETERS) IMPLANT
CLAMP ENDO BABCK 10MM (STAPLE) ×6 IMPLANT
CLIP APPLIE ROT 10 11.4 M/L (STAPLE) ×2 IMPLANT
CLIP TI MEDIUM 24 (CLIP) ×6 IMPLANT
CLIP TI MEDIUM 6 (CLIP) IMPLANT
CLIP TI WIDE RED SMALL 24 (CLIP) ×3 IMPLANT
CLOTH BEACON ORANGE TIMEOUT ST (SAFETY) ×3 IMPLANT
COVER SURGICAL LIGHT HANDLE (MISCELLANEOUS) ×6 IMPLANT
COVER TABLE BACK 60X90 (DRAPES) ×3 IMPLANT
DECANTER SPIKE VIAL GLASS SM (MISCELLANEOUS) ×3 IMPLANT
DERMABOND ADVANCED (GAUZE/BANDAGES/DRESSINGS)
DERMABOND ADVANCED .7 DNX12 (GAUZE/BANDAGES/DRESSINGS) IMPLANT
DEVICE SUT QUICK LOAD TK 5 (STAPLE) ×36 IMPLANT
DEVICE SUT TI-KNOT TK 5X26 (MISCELLANEOUS) ×3 IMPLANT
DEVICE SUTURE ENDOST 10MM (ENDOMECHANICALS) ×3 IMPLANT
DEVICE TROCAR PUNCTURE CLOSURE (ENDOMECHANICALS) ×3 IMPLANT
DISSECTOR BLUNT TIP ENDO 5MM (MISCELLANEOUS) ×3 IMPLANT
DRAIN PENROSE 1/2X36 STERILE (WOUND CARE) IMPLANT
DRAPE LAPAROSCOPIC ABDOMINAL (DRAPES) ×3 IMPLANT
ELECT REM PT RETURN 9FT ADLT (ELECTROSURGICAL) ×6
ELECTRODE REM PT RTRN 9FT ADLT (ELECTROSURGICAL) ×4 IMPLANT
FILTER SMOKE EVAC LAPAROSHD (FILTER) IMPLANT
GLOVE BIO SURGEON STRL SZ 6.5 (GLOVE) ×21 IMPLANT
GLOVE BIO SURGEON STRL SZ7.5 (GLOVE) ×6 IMPLANT
GLOVE BIO SURGEON STRL SZ8 (GLOVE) ×3 IMPLANT
GLOVE BIOGEL PI IND STRL 6.5 (GLOVE) ×6 IMPLANT
GLOVE BIOGEL PI IND STRL 7.0 (GLOVE) ×6 IMPLANT
GLOVE BIOGEL PI IND STRL 7.5 (GLOVE) ×4 IMPLANT
GLOVE BIOGEL PI INDICATOR 6.5 (GLOVE) ×3
GLOVE BIOGEL PI INDICATOR 7.0 (GLOVE) ×3
GLOVE BIOGEL PI INDICATOR 7.5 (GLOVE) ×2
GLOVE SS BIOGEL STRL SZ 6.5 (GLOVE) ×2 IMPLANT
GLOVE SS BIOGEL STRL SZ 7 (GLOVE) ×2 IMPLANT
GLOVE SUPERSENSE BIOGEL SZ 6.5 (GLOVE) ×1
GLOVE SUPERSENSE BIOGEL SZ 7 (GLOVE) ×1
GOWN STRL NON-REIN LRG LVL3 (GOWN DISPOSABLE) ×24 IMPLANT
GOWN STRL REIN XL XLG (GOWN DISPOSABLE) ×3 IMPLANT
HAND ACTIVATED (MISCELLANEOUS) IMPLANT
HEMOSTAT SURGICEL 2X14 (HEMOSTASIS) IMPLANT
KIT BASIN OR (CUSTOM PROCEDURE TRAY) ×6 IMPLANT
KIT ROOM TURNOVER OR (KITS) ×3 IMPLANT
NS IRRIG 1000ML POUR BTL (IV SOLUTION) ×6 IMPLANT
PACK CHEST (CUSTOM PROCEDURE TRAY) ×3 IMPLANT
PAD ARMBOARD 7.5X6 YLW CONV (MISCELLANEOUS) ×6 IMPLANT
PENCIL BUTTON HOLSTER BLD 10FT (ELECTRODE) IMPLANT
SCISSORS LAP 5X35 DISP (ENDOMECHANICALS) ×3 IMPLANT
SET IRRIG TUBING LAPAROSCOPIC (IRRIGATION / IRRIGATOR) ×3 IMPLANT
SHEARS CURVED HARMONIC AC 45CM (MISCELLANEOUS) ×6 IMPLANT
SLEEVE ADV FIXATION 5X100MM (TROCAR) IMPLANT
SLEEVE ENDOPATH XCEL 5M (ENDOMECHANICALS) ×3 IMPLANT
SOLUTION ANTI FOG 6CC (MISCELLANEOUS) ×3 IMPLANT
SPONGE GAUZE 4X4 12PLY (GAUZE/BANDAGES/DRESSINGS) ×3 IMPLANT
STAPLER VISISTAT 35W (STAPLE) IMPLANT
STRIP CLOSURE SKIN 1/2X4 (GAUZE/BANDAGES/DRESSINGS) IMPLANT
SUT MNCRL AB 4-0 PS2 18 (SUTURE) ×9 IMPLANT
SUT PROLENE 4 0 RB 1 (SUTURE) ×1
SUT PROLENE 4-0 RB1 .5 CRCL 36 (SUTURE) ×2 IMPLANT
SUT SILK 0 FSL (SUTURE) IMPLANT
SUT SILK 2 0SH CR/8 30 (SUTURE) IMPLANT
SUT SURGIDAC NAB ES-9 0 48 120 (SUTURE) ×42 IMPLANT
SUT VIC AB 1 CTX 18 (SUTURE) IMPLANT
SUT VIC AB 1 CTX 36 (SUTURE)
SUT VIC AB 1 CTX36XBRD ANBCTR (SUTURE) IMPLANT
SUT VIC AB 2-0 CTX 36 (SUTURE) IMPLANT
SUT VIC AB 3-0 MH 27 (SUTURE) ×3 IMPLANT
SUT VIC AB 3-0 X1 27 (SUTURE) IMPLANT
SUT VIC AB 4-0 SH 18 (SUTURE) IMPLANT
SUT VICRYL 0 UR6 27IN ABS (SUTURE) ×6 IMPLANT
SUT VICRYL 2 TP 1 (SUTURE) IMPLANT
SYR 30ML LL (SYRINGE) IMPLANT
SYSTEM SAHARA CHEST DRAIN RE-I (WOUND CARE) IMPLANT
TIP INNERVISION DETACH 40FR (MISCELLANEOUS) IMPLANT
TIP INNERVISION DETACH 50FR (MISCELLANEOUS) IMPLANT
TIP INNERVISION DETACH 56FR (MISCELLANEOUS) IMPLANT
TIPS INNERVISION DETACH 40FR (MISCELLANEOUS)
TOWEL OR 17X24 6PK STRL BLUE (TOWEL DISPOSABLE) ×3 IMPLANT
TOWEL OR 17X26 10 PK STRL BLUE (TOWEL DISPOSABLE) ×6 IMPLANT
TRAY FOLEY CATH 14FRSI W/METER (CATHETERS) ×3 IMPLANT
TROCAR XCEL 12X100 BLDLESS (ENDOMECHANICALS) ×3 IMPLANT
TROCAR XCEL NON-BLD 11X100MML (ENDOMECHANICALS) ×3 IMPLANT
TROCAR XCEL NON-BLD 5MMX100MML (ENDOMECHANICALS) ×3 IMPLANT
TUBING FILTER THERMOFLATOR (ELECTROSURGICAL) ×3 IMPLANT
TUNNELER SHEATH ON-Q 11GX8 (MISCELLANEOUS) IMPLANT
WATER STERILE IRR 1000ML POUR (IV SOLUTION) IMPLANT

## 2011-06-10 NOTE — Progress Notes (Signed)
Patient ID: Jorge [name], male   DOB: 25-Nov-1929, 78 y.o.   MRN: [id number] TCTS DAILY PROGRESS NOTE                   [address]            [place] [address]          [phone]       Day of Surgery Procedure(s) (LRB): LAPAROSCOPIC REPAIR OF HIATAL HERNIA (N/A) INSERTION OF MESH (N/A) THORACIC EXPOSURE (N/A)  Total Length of Stay:  LOS: 0 days   Subjective: Felt better after stomach decompression , Endoscopy results reviewed, He had some  left lower abdominal discomfort at home yesterday. Returns today for surgery Objective: Vital signs in last 24 hours: Temp:  [97.7 F (36.5 C)] 97.7 F (36.5 C) (03/08 0614) Pulse Rate:  [62] 62  (03/08 0614) Cardiac Rhythm:  [-]  Resp:  [18] 18  (03/08 0614) BP: (135)/(89) 135/89 mmHg (03/08 0614) SpO2:  [97 %] 97 % (03/08 0614)      General appearance: alert, cooperative and no distress Neurologic: intact Heart: regular rate and rhythm, S1, S2 normal, no murmur, click, rub or gallop Lungs: clear to auscultation bilaterally Abdomen: soft, non-tender; bowel sounds normal; no masses,  no organomegaly  Lab Results: Lab Results  Component Value Date   WBC 7.0 06/02/2011   HGB 14.9 06/02/2011   HCT 43.9 06/02/2011   PLT 191 06/02/2011   GLUCOSE 127* 06/03/2011   CHOL 167 01/10/2007   TRIG 89 01/10/2007   HDL 47.7 01/10/2007   LDLCALC 102* 01/10/2007   ALT 11 06/03/2011   AST 17 06/03/2011   NA 138 06/03/2011   K 4.2 06/03/2011   CL 105 06/03/2011   CREATININE 0.93 06/03/2011   BUN 10 06/03/2011   CO2 28 06/03/2011   TSH 1.41 11/19/2009   PSA 0.46 01/10/2007   INR 1.06 06/03/2011   HGBA1C 6.1* 01/10/2007   Ct Abdomen Pelvis Wo Contrast  06/02/2011  *RADIOLOGY REPORT*  Clinical Data: Abdominal pain.  CT ABDOMEN AND PELVIS WITHOUT CONTRAST  Technique:  Multidetector CT imaging of the abdomen and pelvis was performed following the standard protocol without intravenous contrast.  Comparison: CT scan dated 04/22/2011  Findings: Again  noted is a huge hiatal hernia with or general axilla volvulus.  There is a functional obstruction of the distal stomach at the level of the diaphragm.  The stomach is more distended than on the prior study has marked mass effect upon the adjacent heart and other mediastinal structures.  The compression of the heart has increased since the prior exam.  The esophagus is not dilated.  A portion of the transverse colon is herniated through the diaphragmatic defect.  The small bowel herniation seen on the prior study as well as the pancreatic herniations seen on the prior study are not present on the current exam.  The liver, spleen, pancreas, adrenal glands, and kidneys demonstrate no significant abnormalities.  There are multiple gallstones in the nondistended gallbladder.  No bile duct dilatation.  Scattered diverticula in the colon.  The colon is quite redundant. Inguinal hernias containing only fat.  IMPRESSION: Huge hiatal hernia with the majority of the stomach in the chest. There is marked increased distention of the stomach with a functional obstruction of the distal stomach at the level of the diaphragmatic defect.  The stomach is distended to over 17 cm in diameter.  The markedly distended stomach is having a  significant mass effect upon the heart and other mediastinal structures, more so than on the prior exams.  Original Report Authenticated By: Gwynn Burly, M.D.   Dg Chest Port 1 View  06/03/2011  *RADIOLOGY REPORT*  Clinical Data: Hiatal hernia.  PORTABLE CHEST - 1 VIEW  Comparison: 04/19/2009  Findings: Large hiatal hernia again noted.  Stable marked elevation of the left hemidiaphragm.  Areas of atelectasis and/or scarring bilaterally.  Rule probable cardiomegaly.  IMPRESSION: No interval change.  Original Report Authenticated By: Cyndie Chime, M.D.   Assessment/Plan: S/P Procedure(s) (LRB): LAPAROSCOPIC REPAIR OF HIATAL HERNIA (N/A) INSERTION OF MESH (N/A) THORACIC EXPOSURE (N/A) Gall  stones are present Have discussed with Dr Daphine Deutscher, Plan abdominal approach to repair with chest surgery if unable to reduce  The goals risks and alternatives of the planned surgical procedure thoracotomy if needed have been discussed with the patient in detail. The risks of the procedure including death, infection, stroke, myocardial infarction, bleeding, blood transfusion have all been discussed specifically.  I have quoted Jorge [name] a 5% of perioperative mortality and a complication rate as high as 15%. The patient's questions have been answered.Jorge [name] is willing  to proceed with the planned procedure.    [name]  Beeper [phone] Office [phone] 06/10/2011 7:24 AM

## 2011-06-10 NOTE — Anesthesia Preprocedure Evaluation (Addendum)
Anesthesia Evaluation  Patient identified by MRN, date of birth, ID band Patient awake    Reviewed: Allergy & Precautions, H&P , NPO status , Patient's Chart, lab work & pertinent test results  Airway Mallampati: II TM Distance: >3 FB Neck ROM: Full    Dental No notable dental hx. (+) Edentulous Upper and Partial Lower   Pulmonary neg pulmonary ROS,  breath sounds clear to auscultation  Pulmonary exam normal       Cardiovascular hypertension, On Medications Rhythm:Regular Rate:Normal     Neuro/Psych PSYCHIATRIC DISORDERS  Neuromuscular disease negative neurological ROS     GI/Hepatic Neg liver ROS, hiatal hernia, PUD, GERD-  ,  Endo/Other  negative endocrine ROS  Renal/GU negative Renal ROS  negative genitourinary   Musculoskeletal   Abdominal   Peds  Hematology negative hematology ROS (+)   Anesthesia Other Findings   Reproductive/Obstetrics negative OB ROS                           Anesthesia Physical Anesthesia Plan  ASA: III  Anesthesia Plan: General   Post-op Pain Management:    Induction: Intravenous  Airway Management Planned: Double Lumen EBT  Additional Equipment: CVP, Arterial line and Ultrasound Guidance Line Placement  Intra-op Plan:   Post-operative Plan: Extubation in OR and Possible Post-op intubation/ventilation  Informed Consent: I have reviewed the patients History and Physical, chart, labs and discussed the procedure including the risks, benefits and alternatives for the proposed anesthesia with the patient or authorized representative who has indicated his/her understanding and acceptance.     Plan Discussed with: CRNA and Surgeon  Anesthesia Plan Comments:         Anesthesia Quick Evaluation

## 2011-06-10 NOTE — Anesthesia Postprocedure Evaluation (Signed)
  Anesthesia Post-op Note  Patient: Jorge [name]  Procedure(s) Performed: Procedure(s) (LRB): LAPAROSCOPIC REPAIR OF HIATAL HERNIA (N/A) INSERTION OF MESH (N/A)  Patient Location: PACU  Anesthesia Type: General  Level of Consciousness: awake  Airway and Oxygen Therapy: Patient Spontanous Breathing and Patient connected to nasal cannula oxygen  Post-op Pain: none  Post-op Assessment: Post-op Vital signs reviewed, Patient's Cardiovascular Status Stable, Respiratory Function Stable and Patent Airway  Post-op Vital Signs: Reviewed and stable  Complications: No apparent anesthesia complications

## 2011-06-10 NOTE — Op Note (Signed)
Surgeon: Wenda Low, MD, FACS  Asst:  Ofilia Neas, MD, FACS  Anes:  general  Procedure: Laparoscopic takedown of Type IV Mixed hiatus hernia, closure of hiatus with Glendell Docker biologic mesh bridge, gastropexy   Diagnosis: Large type IV mixed hiatus hernia containing most all of the stomach, transverse colon, and small bowel  Complications: None  Op Time: 5 hours   EBL:   40  cc  Description of Procedure:  The patient was taken to OR 14 where he was placed in the supine position with the arms tucked.  PCMX prep used and timeout performed.  Access achieved with 5 mm optiview through the left upper quadrant with 6 trocars placed including two 12 mm trocars.  The Nathanson retractor was placed to retract the left lateral segment.  Using Endobabcocks and Prestige graspers, the stomach, colon, and small bowel were retrieved from the giant hernia which involved both the right and left chest.  Thus began a 4 hour dissection working along the lessor and greater curvatures of the stomach with the Harmonic scalpel to free the stomach which at the apex was stuck up on the proximal descending aorta.  The right side was stuck as well and a large vessel that was from the posterior midline and ran into the right chest along the upper diaphragm was preserved.  The diaphragmatic defect was too broad to allow posterior approximation and the esophagus could not get to length in the abdomen despite our placement of a penrose around the stomach and divison of the sac.  The tension pulling the stomach was relieved and we decided to close the defect with a Glendell Docker biologic patch sutured to the diaphragmatic ring with 0 surgidec(7)  with Endostitch and ty knots and the skirt was draping over the proximal stomach and was sutured to the mesh with 3 more sutures of surgidec.  The greater curvature was then plicated with 2 horizontal mattress sutures placed through the stomach and then pulled up with then Endoclose and sutured above  the fascia.  The lateral 12 mm trocar site was closed with a 0 vicryl on the fascia.   The incisions were injected with Exparel and closed with 4-0 monocryl.  Benzoin and steristrips.  Patient was taken to PACU in stable condition.   Matt B. Daphine Deutscher, MD, [facility] Surgery, [place] [phone]

## 2011-06-10 NOTE — Transfer of Care (Signed)
Immediate Anesthesia Transfer of Care Note  Patient: Jorge [name]  Procedure(s) Performed: Procedure(s) (LRB): LAPAROSCOPIC REPAIR OF HIATAL HERNIA (N/A) INSERTION OF MESH (N/A)  Patient Location: PACU  Anesthesia Type: General  Level of Consciousness: awake, alert  and oriented  Airway & Oxygen Therapy: Patient Spontanous Breathing and Patient connected to face mask oxygen  Post-op Assessment: Report given to PACU RN, Post -op Vital signs reviewed and stable and Patient moving all extremities X 4  Post vital signs: Reviewed and stable  Complications: No apparent anesthesia complications

## 2011-06-10 NOTE — H&P (Signed)
Chief Complaint:  Left sided chest pain and large type IV mixed hiatus hernia  History of Present Illness:  Jorge [name] is an 78 y.o. male who was admitted last week by Dr. Andrey Campanile with a symptomatic mixed hiatus hernia that is at least a type III if not IV.  He has had this for many years first noted when he was a young man by Dr. Noland Fordyce but he has been minimally symptomatic.  At this admission he was seen by Dr. Tyrone Sage and me and informed consent was obtained about abdominal or transthoracic approaches to this hiatal hernia.  He was discharged two days ago to report back today for repair.  He did have recurrent pain yesterday.   Past Medical History  Diagnosis Date  . Anxiety   . GERD (gastroesophageal reflux disease)   . Hypertension   . Gastric ulcer     Dr. Christella Hartigan  . ED (erectile dysfunction)   . History of colonic polyps   . Prostate cancer 2010    Dr. Lenn Sink  . Vitamin d deficiency   . Insomnia   . Hernia, hiatal   . Syncope 2011    from Bicalutamide    Past Surgical History  Procedure Date  . Transurethral resection of prostate   . Appendectomy     age 33  . Esophagogastroduodenoscopy 06/05/2011    Procedure: ESOPHAGOGASTRODUODENOSCOPY (EGD);  Surgeon: Iva Boop, MD;  Location: [facility] ENDOSCOPY;  Service: Endoscopy;  Laterality: N/A;    No current facility-administered medications for this encounter.   Aspirin; Azithromycin; Cefuroxime axetil; Iodine; Naproxen; and Penicillins Family History  Problem Relation Age of Onset  . Pancreatic cancer Father   . Hypertension Father   . Hypertension Other    Social History:   reports that he has quit smoking. He does not have any smokeless tobacco history on file. He reports that he drinks alcohol. He reports that he does not use illicit drugs.   REVIEW OF SYSTEMS - PERTINENT POSITIVES ONLY: No hx of DVT.    Physical Exam:   Blood pressure 135/89, pulse 62, temperature 97.7 F (36.5 C), temperature source Oral,  resp. rate 18, SpO2 97.00%. There is no height or weight on file to calculate BMI.  Gen:  WDWN white male NAD --appears younger than stated age Neurological: Alert and oriented to person, place, and time. Motor and sensory function is grossly intact  Head: Normocephalic and atraumatic.  Eyes: Conjunctivae are normal. Pupils are equal, round, and reactive to light. No scleral icterus.  Neck: Normal range of motion. Neck supple. No tracheal deviation or thyromegaly present.  Cardiovascular:  SR without murmurs or gallops.  No carotid bruits Respiratory: Effort normal.  No respiratory distress. No chest wall tenderness. Breath sounds normal.  No wheezes, rales or rhonchi.  Abdomen:  Somewhat scaphoid,  Nontender, thin GU:  Hx prostatectomy Musculoskeletal: Normal range of motion. Extremities are nontender. No cyanosis, edema or clubbing noted Lymphadenopathy: No cervical, preauricular, postauricular or axillary adenopathy is present Skin: Skin is warm and dry. No rash noted. No diaphoresis. No erythema. No pallor. Pscyh: Normal mood and affect. Behavior is normal. Judgment and thought content normal. Recall is good  LABORATORY RESULTS: No results found for this or any previous visit (from the past 48 hour(s)).  RADIOLOGY RESULTS: No results found.  Problem List: Patient Active Problem List  Diagnoses  . VITAMIN D DEFICIENCY  . ANXIETY  . INSOMNIA, PERSISTENT  . CERUMEN IMPACTION  . HYPERTENSION  .  GERD  . PEPTIC ULCER DISEASE  . BENIGN PROSTATIC HYPERTROPHY  . ERECTILE DYSFUNCTION  . Diarrhea  . CERVICAL STRAIN  . PERSONAL HX COLON CANCER  . PROSTATE CANCER, HX OF  . COLONIC POLYPS, HX OF  . HIP PAIN  . Neoplasm of uncertain behavior of skin  . LUQ abdominal pain  . Constipation  . Chest pain, atypical  . Irreducible hiatal hernia  . Nonspecific (abnormal) findings on radiological and other examination of gastrointestinal tract    Assessment & Plan: Large type III-IV  mixed hiatus hernia that is symptomatic.  We will try to manage this through an abdominal approach but will are prepared to approach this through the left chest.  The patient and his wife are aware of the risks and benefits of the surgery.  We will try to get the abdominal organs back into the abdomen with hopeful closure of the diaphragm but that may not be possible.    There has been no change in the patient's past medical history or physical exam in the past 24 hours to the best of my knowledge. I examined the patient in the holding area and have made any changes to the history and physical exam report that is included above.   Expectations and outcome results have been discussed with the patient to include risks and benefits.  All questions have been answered and we will proceed with previously discussed procedure noted and signed in the consent form in the patient's record.    [name] BMD FACS 7:33 AM  06/10/2011    Matt B. Daphine Deutscher, MD, [facility] [phone] beeper [phone]  06/10/2011 7:25 AM

## 2011-06-11 ENCOUNTER — Inpatient Hospital Stay (HOSPITAL_COMMUNITY): Payer: Medicare Other

## 2011-06-11 DIAGNOSIS — K219 Gastro-esophageal reflux disease without esophagitis: Secondary | ICD-10-CM | POA: Diagnosis not present

## 2011-06-11 LAB — DIFFERENTIAL
Basophils Absolute: 0 10*3/uL (ref 0.0–0.1)
Eosinophils Relative: 0 % (ref 0–5)
Lymphocytes Relative: 6 % — ABNORMAL LOW (ref 12–46)
Monocytes Absolute: 1.3 10*3/uL — ABNORMAL HIGH (ref 0.1–1.0)

## 2011-06-11 LAB — CBC
HCT: 37.5 % — ABNORMAL LOW (ref 39.0–52.0)
MCH: 31.6 pg (ref 26.0–34.0)
MCHC: 33.3 g/dL (ref 30.0–36.0)
MCV: 94.9 fL (ref 78.0–100.0)
RDW: 13.2 % (ref 11.5–15.5)

## 2011-06-11 MED ORDER — SODIUM CHLORIDE 0.9 % IJ SOLN
INTRAMUSCULAR | Status: AC
  Filled 2011-06-11: qty 10

## 2011-06-11 NOTE — Progress Notes (Signed)
   CARDIOTHORACIC SURGERY PROGRESS NOTE  1 Day Post-Op  S/P Procedure(s) (LRB): LAPAROSCOPIC REPAIR OF HIATAL HERNIA (N/A) INSERTION OF MESH (N/A)  Subjective: Feels okay.  No nausea.  Mild soreness.  Objective: Vital signs in last 24 hours: Temp:  [97.4 F (36.3 C)-98.1 F (36.7 C)] 98.1 F (36.7 C) (03/09 1522) Pulse Rate:  [66-81] 75  (03/09 1522) Cardiac Rhythm:  [-] Heart block (03/09 1522) Resp:  [14-20] 18  (03/09 1522) BP: (109-165)/(59-84) 144/75 mmHg (03/09 1522) SpO2:  [95 %-100 %] 97 % (03/09 1522)  Physical Exam:  Rhythm:   NSR  Breath sounds: Fairly clear  Heart sounds:  RRR  Incisions:  Dressings dry  Abdomen:  Soft, quiet  Extremities:  Warm, well perfused   Intake/Output from previous day: 03/08 0701 - 03/09 0700 In: 5356.7 [I.V.:4856.7; IV Piggyback:500] Out: 4000 [Urine:3950; Blood:50] Intake/Output this shift: Total I/O In: 1100 [I.V.:1100] Out: 175 [Urine:175]  Lab Results:  Basename 06/11/11 1800 06/11/11 0504 06/10/11 1812  WBC -- 11.7* 13.8*  HGB 13.4 12.5* --  HCT 40.4 37.5* --  PLT -- 141* 134*   BMET:  Basename 06/10/11 1812  NA --  K --  CL --  CO2 --  GLUCOSE --  BUN --  CREATININE 0.85  CALCIUM --    CBG (last 3)  No results found for this basename: GLUCAP:3 in the last 72 hours PT/INR:  No results found for this basename: LABPROT,INR in the last 72 hours  CXR:  None ordered for today  Gastrograffin Swallow:  UPPER GI SERIES WITHOUT KUB  Technique: Routine upper GI series was performed with water  soluble barium.  Fluoroscopy Time: 3.0 minutes  Comparison: 06/02/2011 CT  Findings: The distal esophagus is tortuous.  Evidence of hiatal hernia repair is present with the stomach now  below the diaphragm.  There is small amount of gastroesophageal reflux noted.  There is no evidence of leak.  Films up to 2 1/2 hours were obtained and contrast did not empty  from the mid - distal stomach.  IMPRESSION:  Evidence of  hiatal hernia repair without evidence of leak.  Contrast did not progress past the mid - distal stomach at 2 1/2  hours after the exam. Obstruction is not entirely excluded but this  may be secondary to postoperative gastroparesis.  Small amount of gastroesophageal reflux.  Original Report Authenticated By: Rosendo Gros, M.D.   Assessment/Plan: S/P Procedure(s) (LRB): LAPAROSCOPIC REPAIR OF HIATAL HERNIA (N/A) INSERTION OF MESH (N/A)  Stable POD1 I would be cautious about advancing diet due to concerns regarding postop ileus and/or delayed gastric emptying Will defer primary care to Dr Daphine Deutscher and colleagues  Purcell Nails 06/11/2011 6:38 PM

## 2011-06-11 NOTE — Progress Notes (Signed)
[facility]/Jorge [name] Progress Note 1 Day Post-Op  Subjective: The patient is looking very good this morning.  Minimal to no pain.  Objective: Vital signs in last 24 hours: Temp:  [97.4 F (36.3 C)-98.1 F (36.7 C)] 98 F (36.7 C) (03/09 0725) Pulse Rate:  [66-83] 70  (03/09 0725) Resp:  [14-25] 20  (03/09 0725) BP: (116-165)/(64-84) 124/64 mmHg (03/09 0725) SpO2:  [95 %-100 %] 98 % (03/09 0725) Arterial Line BP: (144-200)/(56-84) 200/84 mmHg (03/08 1615) Weight:  [93.9 kg (207 lb 0.2 oz)] 93.9 kg (207 lb 0.2 oz) (03/08 1555)    Intake/Output from previous day: 03/08 0701 - 03/09 0700 In: 5256.7 [I.V.:4756.7; IV Piggyback:500] Out: 4000 [Urine:3950; Blood:50] Intake/Output this shift:    General: No acute distress  Lungs: Clear to auscultation.  Oxygen levels are 98% on RA  Abd: Soft, good bowel sounds, but no BM or flatus  Extremities: No DVT signs or symptoms.  Neuro: Intact  Lab Results:  @LABLAST2 (wbc:2,hgb:2,hct:2,plt:2) BMET  Basename 06/10/11 1812  NA --  K --  CL --  CO2 --  GLUCOSE --  BUN --  CREATININE 0.85  CALCIUM --   PT/INR No results found for this basename: LABPROT:2,INR:2 in the last 72 hours ABG No results found for this basename: PHART:2,PCO2:2,PO2:2,HCO3:2 in the last 72 hours  Studies/Results: Dg Chest Port 1 View  06/10/2011  *RADIOLOGY REPORT*  Clinical Data: Status post central line insertion.  PORTABLE CHEST - 1 VIEW  Comparison: Multiple priors, most recently 06/03/2011.  Findings: Interval placement of a right-sided internal jugular central venous catheter with tip terminating in the mid superior vena cava.  Diaphragmatic herniation with predominately intrathoracic stomach again noted.  Bibasilar opacities in the lungs likely reflect areas of chronic scarring and/or atelectasis. No definite pleural effusions.  No definite consolidative airspace disease.  No pneumothorax.  Heart size is normal.  Mediastinal contours are slightly distorted by  patient's anatomy, but are consistent with multiple prior examinations.  IMPRESSION: 1.  Status post right internal jugular central venous catheter insertion with tip in the mid superior vena cava.  No pneumothorax or other complicating features. 2.  Radiographic appearance of the chest otherwise appears similar to prior examinations, as detailed above, accounting for slight differences in patient positioning and degree of gastric distension.  Original Report Authenticated By: Florencia Reasons, M.D.    Anti-infectives: Anti-infectives    None      Assessment/Plan: s/p Procedure(s): LAPAROSCOPIC REPAIR OF HIATAL HERNIA INSERTION OF MESH Advance diet Will decrease IVFs if tolerates diet well.  LOS: 1 day   Marta Lamas. Gae Bon, MD, FACS [phone] [facility] 06/11/2011

## 2011-06-12 ENCOUNTER — Inpatient Hospital Stay (HOSPITAL_COMMUNITY): Payer: Medicare Other

## 2011-06-12 DIAGNOSIS — J90 Pleural effusion, not elsewhere classified: Secondary | ICD-10-CM | POA: Diagnosis not present

## 2011-06-12 DIAGNOSIS — J9819 Other pulmonary collapse: Secondary | ICD-10-CM | POA: Diagnosis not present

## 2011-06-12 LAB — CBC
HCT: 37.8 % — ABNORMAL LOW (ref 39.0–52.0)
MCH: 31.5 pg (ref 26.0–34.0)
MCV: 95.9 fL (ref 78.0–100.0)
Platelets: 130 10*3/uL — ABNORMAL LOW (ref 150–400)
RDW: 13.4 % (ref 11.5–15.5)
WBC: 10.1 10*3/uL (ref 4.0–10.5)

## 2011-06-12 LAB — DIFFERENTIAL
Basophils Absolute: 0 10*3/uL (ref 0.0–0.1)
Eosinophils Absolute: 0 10*3/uL (ref 0.0–0.7)
Eosinophils Relative: 0 % (ref 0–5)
Lymphocytes Relative: 6 % — ABNORMAL LOW (ref 12–46)
Lymphs Abs: 0.6 10*3/uL — ABNORMAL LOW (ref 0.7–4.0)
Monocytes Absolute: 1.5 10*3/uL — ABNORMAL HIGH (ref 0.1–1.0)

## 2011-06-12 MED ORDER — SODIUM CHLORIDE 0.9 % IJ SOLN
INTRAMUSCULAR | Status: AC
  Filled 2011-06-12: qty 20

## 2011-06-12 MED ORDER — SODIUM CHLORIDE 0.9 % IJ SOLN
INTRAMUSCULAR | Status: AC
  Filled 2011-06-12: qty 10

## 2011-06-12 NOTE — Progress Notes (Signed)
   CARDIOTHORACIC SURGERY PROGRESS NOTE  2 Days Post-Op  S/P Procedure(s) (LRB): LAPAROSCOPIC REPAIR OF HIATAL HERNIA (N/A) INSERTION OF MESH (N/A)  Subjective: Looks and feels well. No flatus. No BM  Objective: Vital signs in last 24 hours: Temp:  [98.1 F (36.7 C)-99.1 F (37.3 C)] 98.8 F (37.1 C) (03/10 0725) Pulse Rate:  [75-84] 84  (03/10 0725) Cardiac Rhythm:  [-] Heart block (03/10 0700) Resp:  [17-21] 21  (03/10 0725) BP: (110-144)/(63-75) 110/67 mmHg (03/10 0725) SpO2:  [94 %-97 %] 94 % (03/10 0725)  Physical Exam:  Rhythm:   sinus  Breath sounds: clear  Heart sounds:  RRR  Incisions:  Dressings dry  Abdomen:  Soft, hypoactive bowel sounds  Extremities:  warm   Intake/Output from previous day: 03/09 0701 - 03/10 0700 In: 1600 [I.V.:1600] Out: 575 [Urine:575] Intake/Output this shift:    Lab Results:  Basename 06/12/11 0538 06/11/11 1800 06/11/11 0504  WBC 10.1 -- 11.7*  HGB 12.4* 13.4 --  HCT 37.8* 40.4 --  PLT 130* -- 141*   BMET:  Basename 06/10/11 1812  NA --  K --  CL --  CO2 --  GLUCOSE --  BUN --  CREATININE 0.85  CALCIUM --    CBG (last 3)  No results found for this basename: GLUCAP:3 in the last 72 hours PT/INR:  No results found for this basename: LABPROT,INR in the last 72 hours  CXR: No pneumothorax.  Still some contrast in stomach but some has moved through bowels    Assessment/Plan: S/P Procedure(s) (LRB): LAPAROSCOPIC REPAIR OF HIATAL HERNIA (N/A) INSERTION OF MESH (N/A)  Stable POD2 Likely poor gastric emptying Plan per General Surgery Mobilize  Duante Arocho H 06/12/2011 12:18 PM

## 2011-06-12 NOTE — Progress Notes (Signed)
2 Days Post-Op  Subjective: Very minimal pain following surgery.  Not passing any flatus yet.  Wants to suck on hard candy.   Objective: Vital signs in last 24 hours: Temp:  [98 F (36.7 C)-99.1 F (37.3 C)] 98.8 F (37.1 C) (03/10 0725) Pulse Rate:  [66-84] 84  (03/10 0725) Resp:  [15-21] 21  (03/10 0725) BP: (109-144)/(59-75) 110/67 mmHg (03/10 0725) SpO2:  [94 %-97 %] 94 % (03/10 0725) Last BM Date: 06/09/11  Intake/Output from previous day: 03/09 0701 - 03/10 0700 In: 1600 [I.V.:1600] Out: 575 [Urine:575] Intake/Output this shift:    General appearance: alert, cooperative and no distress Resp: diminished breath sounds base - left Cardio: regular rate and rhythm GI: abd quiet, but soft, non distended  Lab Results:   Basename 06/12/11 0538 06/11/11 1800 06/11/11 0504  WBC 10.1 -- 11.7*  HGB 12.4* 13.4 --  HCT 37.8* 40.4 --  PLT 130* -- 141*   BMET  Basename 06/10/11 1812  NA --  K --  CL --  CO2 --  GLUCOSE --  BUN --  CREATININE 0.85  CALCIUM --   PT/INR No results found for this basename: LABPROT:2,INR:2 in the last 72 hours ABG No results found for this basename: PHART:2,PCO2:2,PO2:2,HCO3:2 in the last 72 hours  Studies/Results: Dg Chest Port 1 View  06/10/2011  *RADIOLOGY REPORT*  Clinical Data: Status post central line insertion.  PORTABLE CHEST - 1 VIEW  Comparison: Multiple priors, most recently 06/03/2011.  Findings: Interval placement of a right-sided internal jugular central venous catheter with tip terminating in the mid superior vena cava.  Diaphragmatic herniation with predominately intrathoracic stomach again noted.  Bibasilar opacities in the lungs likely reflect areas of chronic scarring and/or atelectasis. No definite pleural effusions.  No definite consolidative airspace disease.  No pneumothorax.  Heart size is normal.  Mediastinal contours are slightly distorted by patient's anatomy, but are consistent with multiple prior examinations.   IMPRESSION: 1.  Status post right internal jugular central venous catheter insertion with tip in the mid superior vena cava.  No pneumothorax or other complicating features. 2.  Radiographic appearance of the chest otherwise appears similar to prior examinations, as detailed above, accounting for slight differences in patient positioning and degree of gastric distension.  Original Report Authenticated By: Florencia Reasons, M.D.   Dg Kayleen Memos W/water Sol Cm  06/11/2011  *RADIOLOGY REPORT*  Clinical Data:  78 year old male - status post hiatal hernia repair, postop day #1.  UPPER GI SERIES WITHOUT KUB  Technique:  Routine upper GI series was performed with water soluble barium.  Fluoroscopy Time: 3.0 minutes  Comparison:  06/02/2011 CT  Findings: The distal esophagus is tortuous. Evidence of hiatal hernia repair is present with the stomach now below the diaphragm. There is small amount of gastroesophageal reflux noted. There is no evidence of leak.  Films up to 2 1/2 hours were obtained and contrast did not empty from the mid - distal stomach.  IMPRESSION: Evidence of hiatal hernia repair without evidence of leak.  Contrast did not progress past the mid - distal stomach at 2 1/2 hours after the exam. Obstruction is not entirely excluded but this may be secondary to postoperative gastroparesis.  Small amount of gastroesophageal reflux.  Original Report Authenticated By: Rosendo Gros, M.D.    Anti-infectives: Anti-infectives    None      Assessment/Plan: s/p Procedure(s) (LRB): LAPAROSCOPIC REPAIR OF HIATAL HERNIA (N/A) INSERTION OF MESH (N/A) Continue to await return of bowel function.  Pt doing remarkably well and feels he can breath some easier following the surgery and pain is better.   LOS: 2 days    Zailey Audia,PA-C Pager [phone] General Trauma Pager [phone]

## 2011-06-12 NOTE — Progress Notes (Signed)
Patient feels good and wants to start po's. VS stable, alert, abd soft. Yesterday's xray showed no evidence of leak, but contrast stayed in stomach. Today's KUB shows residual contrast in stomach, but also has moved into small bowel  Believe he still has a bit of gastric ileus and needs to wait another day to start diet

## 2011-06-13 ENCOUNTER — Inpatient Hospital Stay (HOSPITAL_COMMUNITY): Payer: Medicare Other

## 2011-06-13 DIAGNOSIS — Z5189 Encounter for other specified aftercare: Secondary | ICD-10-CM | POA: Diagnosis not present

## 2011-06-13 NOTE — Progress Notes (Signed)
Gave report to RN for [phone]. VS are stable, all meds up to current time, transferring per MD order, family aware.

## 2011-06-13 NOTE — Progress Notes (Signed)
Arrived to 5158 from 3300. A/Ox4, denies nausea/pain. Oriented to room and surroundings.

## 2011-06-13 NOTE — Progress Notes (Signed)
Patient ID: Jorge [name], male   DOB: 09-03-1929, 78 y.o.   MRN: [id number]                   [address]            [place] [address]          [phone]     3 Days Post-Op Procedure(s) (LRB): LAPAROSCOPIC REPAIR OF HIATAL HERNIA (N/A) INSERTION OF MESH (N/A)  LOS: 3 days   Subjective: No complaints, ate some chicken soup. Notes he had BMx 2 likely related to gastrografin.  Objective: Vital signs in last 24 hours: Patient Vitals for the past 24 hrs:  BP Temp Temp src Pulse Resp SpO2  06/13/11 1200 134/71 mmHg 98.4 F (36.9 C) Oral - - -  06/13/11 0800 124/71 mmHg 97.4 F (36.3 C) Oral - - -  06/13/11 0400 125/67 mmHg 98.9 F (37.2 C) Oral 83  22  95 %  06/13/11 0000 124/67 mmHg 98.9 F (37.2 C) Oral 83  25  95 %  06/12/11 2017 141/69 mmHg 98.1 F (36.7 C) Oral 89  23  94 %  06/12/11 1545 127/67 mmHg 98.8 F (37.1 C) Oral 86  18  97 %  06/12/11 1423 - - - 79  18  98 %    Filed Weights   06/10/11 1555  Weight: 207 lb 0.2 oz (93.9 kg)        Intake/Output from previous day: 03/10 0701 - 03/11 0700 In: 1200 [I.V.:1200] Out: 1275 [Urine:1275] Intake/Output this shift: Total I/O In: 780 [P.O.:360; I.V.:420] Out: -   Scheduled Meds:   . heparin  5,000 Units Subcutaneous Q8H  . sodium chloride      . sodium chloride       Continuous Infusions:   . dextrose 5 % and 0.45 % NaCl with KCl 20 mEq/L 100 mL/hr at 06/13/11 1209   PRN Meds:.morphine, ondansetron (ZOFRAN) IV  General appearance: alert and cooperative Neurologic: intact Heart: regular rate and rhythm, S1, S2 normal, no murmur, click, rub or gallop Lungs: clear to auscultation bilaterally Abdomen: soft, non-tender; bowel sounds normal; no masses,  no organomegaly Extremities: extremities normal, atraumatic, no cyanosis or edema and Homans sign is negative, no sign of DVT Wound: intact  Lab Results: CBC: Basename 06/12/11 0538 06/11/11 1800 06/11/11 0504  WBC 10.1 -- 11.7*    HGB 12.4* 13.4 --  HCT 37.8* 40.4 --  PLT 130* -- 141*   BMET:  Basename 06/10/11 1812  NA --  K --  CL --  CO2 --  GLUCOSE --  BUN --  CREATININE 0.85  CALCIUM --    PT/INR: No results found for this basename: LABPROT,INR in the last 72 hours   Radiology Dg Abd 1 View  06/13/2011  *RADIOLOGY REPORT*  Clinical Data: Evaluate for residual contrast.  ABDOMEN - 1 VIEW  Comparison: 06/12/2011  Findings: Contrast material seen throughout nondistended colon. Minimal if any contrast in the stomach.  No small bowel dilatation. No supine evidence of free air.  IMPRESSION: Residual contrast, predominately throughout nondistended colon.  Original Report Authenticated By: Cyndie Chime, M.D.   Dg Abd Acute W/chest  06/12/2011  *RADIOLOGY REPORT*  Clinical Data: 78 year old male - follow-up hernia repair.  ACUTE ABDOMEN SERIES (ABDOMEN 2 VIEW & CHEST 1 VIEW)  Comparison: 03/09 and 06/02/2011 studies  Findings: This is a low-volume film with bilateral lower lung atelectasis. Small bilateral pleural effusions are present. A  right IJ central venous catheter is present with tip overlying the mid SVC. There is no evidence of pneumothorax.  The stomach is mildly distended. Contrast within the small bowel and colon are now noted that there is contrast still present within the stomach.  IMPRESSION: Contrast from the upper GI yesterday has now progressed into the small bowel and colon but some contrast persist within the stomach.  Bilateral lower lung atelectasis and small bilateral pleural effusions.  Original Report Authenticated By: Rosendo Gros, M.D.   Varney Biles Kayleen Memos W/water Sol Cm  06/11/2011  *RADIOLOGY REPORT*  Clinical Data:  78 year old male - status post hiatal hernia repair, postop day #1.  UPPER GI SERIES WITHOUT KUB  Technique:  Routine upper GI series was performed with water soluble barium.  Fluoroscopy Time: 3.0 minutes  Comparison:  06/02/2011 CT  Findings: The distal esophagus is tortuous. Evidence  of hiatal hernia repair is present with the stomach now below the diaphragm. There is small amount of gastroesophageal reflux noted. There is no evidence of leak.  Films up to 2 1/2 hours were obtained and contrast did not empty from the mid - distal stomach.  IMPRESSION: Evidence of hiatal hernia repair without evidence of leak.  Contrast did not progress past the mid - distal stomach at 2 1/2 hours after the exam. Obstruction is not entirely excluded but this may be secondary to postoperative gastroparesis.  Small amount of gastroesophageal reflux.  Original Report Authenticated By: Rosendo Gros, M.D.     Assessment/Plan: S/P Procedure(s) (LRB): LAPAROSCOPIC REPAIR OF HIATAL HERNIA (N/A) INSERTION OF MESH (N/A) Progressing well , likely significant gastric atony with previous history   Jorge Ovens MD 06/13/2011 1:53 PM

## 2011-06-13 NOTE — Progress Notes (Signed)
3 Days Post-Op  Subjective: Pt looks good. Denies N/V, bloating +BM Pain control good.  Voiding well without foley  Objective: Vital signs in last 24 hours: Temp:  [98.1 F (36.7 C)-98.9 F (37.2 C)] 98.9 F (37.2 C) (03/11 0400) Pulse Rate:  [77-89] 83  (03/11 0400) Resp:  [18-25] 22  (03/11 0400) BP: (124-141)/(67-70) 125/67 mmHg (03/11 0400) SpO2:  [94 %-98 %] 95 % (03/11 0400) Last BM Date: 06/09/11  Intake/Output this shift:    Physical Exam: BP 125/67  Pulse 83  Temp(Src) 98.9 F (37.2 C) (Oral)  Resp 22  Ht 6\' 1"  (1.854 m)  Wt 93.9 kg (207 lb 0.2 oz)  BMI 27.31 kg/m2  SpO2 95% Abdomen: soft, ND, minimal tenderness Active BS Incisions all c/d/i  Labs: CBC  Basename 06/12/11 0538 06/11/11 1800 06/11/11 0504  WBC 10.1 -- 11.7*  HGB 12.4* 13.4 --  HCT 37.8* 40.4 --  PLT 130* -- 141*   BMET  Basename 06/10/11 1812  NA --  K --  CL --  CO2 --  GLUCOSE --  BUN --  CREATININE 0.85  CALCIUM --   LFT No results found for this basename: PROT,ALBUMIN,AST,ALT,ALKPHOS,BILITOT,BILIDIR,IBILI,LIPASE in the last 72 hours PT/INR No results found for this basename: LABPROT:2,INR:2 in the last 72 hours ABG No results found for this basename: PHART:2,PCO2:2,PO2:2,HCO3:2 in the last 72 hours  Studies/Results: Dg Abd Acute W/chest  06/12/2011  *RADIOLOGY REPORT*  Clinical Data: 78 year old male - follow-up hernia repair.  ACUTE ABDOMEN SERIES (ABDOMEN 2 VIEW & CHEST 1 VIEW)  Comparison: 03/09 and 06/02/2011 studies  Findings: This is a low-volume film with bilateral lower lung atelectasis. Small bilateral pleural effusions are present. A right IJ central venous catheter is present with tip overlying the mid SVC. There is no evidence of pneumothorax.  The stomach is mildly distended. Contrast within the small bowel and colon are now noted that there is contrast still present within the stomach.  IMPRESSION: Contrast from the upper GI yesterday has now progressed into  the small bowel and colon but some contrast persist within the stomach.  Bilateral lower lung atelectasis and small bilateral pleural effusions.  Original Report Authenticated By: Rosendo Gros, M.D.   Varney Biles Kayleen Memos W/water Sol Cm  06/11/2011  *RADIOLOGY REPORT*  Clinical Data:  78 year old male - status post hiatal hernia repair, postop day #1.  UPPER GI SERIES WITHOUT KUB  Technique:  Routine upper GI series was performed with water soluble barium.  Fluoroscopy Time: 3.0 minutes  Comparison:  06/02/2011 CT  Findings: The distal esophagus is tortuous. Evidence of hiatal hernia repair is present with the stomach now below the diaphragm. There is small amount of gastroesophageal reflux noted. There is no evidence of leak.  Films up to 2 1/2 hours were obtained and contrast did not empty from the mid - distal stomach.  IMPRESSION: Evidence of hiatal hernia repair without evidence of leak.  Contrast did not progress past the mid - distal stomach at 2 1/2 hours after the exam. Obstruction is not entirely excluded but this may be secondary to postoperative gastroparesis.  Small amount of gastroesophageal reflux.  Original Report Authenticated By: Rosendo Gros, M.D.    Assessment: Large Hiatal Hernia   Procedure(s): LAPAROSCOPIC REPAIR OF HIATAL HERNIA INSERTION OF MESH  Plan:  X-ray did not find appear to show any more contrast in stomach so will allow sips of clears today. Transfer to floor  LOS: 3 days    Marianna Fuss PA-C  06/13/2011 7:52 AM

## 2011-06-13 NOTE — Progress Notes (Signed)
Pt had first post surgical BM this am

## 2011-06-13 NOTE — Progress Notes (Signed)
Looks well.  No complaints.  To floor

## 2011-06-14 NOTE — Progress Notes (Signed)
4 Days Post-Op  Subjective: Pt looks good. Denies N/V, bloating.  Has had multiple BMs, passing gas.  Wants to know why he's still in the hospital (would like to go home). Tolerating clears well, would like to advance.   Objective: Vital signs in last 24 hours: Temp:  [98 F (36.7 C)-99.5 F (37.5 C)] 98.6 F (37 C) (03/12 0541) Pulse Rate:  [79-80] 79  (03/12 0541) Resp:  [18-20] 18  (03/12 0541) BP: (123-140)/(65-96) 128/78 mmHg (03/12 0541) SpO2:  [93 %-96 %] 93 % (03/12 0541) Last BM Date: 06/13/11   Physical Exam: BP 128/78  Pulse 79  Temp(Src) 98.6 F (37 C) (Oral)  Resp 18  Ht 6\' 1"  (1.854 m)  Wt 207 lb 0.2 oz (93.9 kg)  BMI 27.31 kg/m2  SpO2 93% Pulm: CTA bilaterally, no wheezes CV: RRR, no murmur Abdomen: soft, ND, minimal tenderness, +BS, Incisions c/d/i  Labs: CBC  Basename 06/12/11 0538 06/11/11 1800  WBC 10.1 --  HGB 12.4* 13.4  HCT 37.8* 40.4  PLT 130* --   BMET No results found for this basename: NA:2,K:2,CL:2,CO2:2,GLUCOSE:2,BUN:2,CREATININE:2,CALCIUM:2 in the last 72 hours LFT No results found for this basename: PROT,ALBUMIN,AST,ALT,ALKPHOS,BILITOT,BILIDIR,IBILI,LIPASE in the last 72 hours PT/INR No results found for this basename: LABPROT:2,INR:2 in the last 72 hours ABG No results found for this basename: PHART:2,PCO2:2,PO2:2,HCO3:2 in the last 72 hours  Studies/Results: Dg Abd 1 View  06/13/2011  *RADIOLOGY REPORT*  Clinical Data: Evaluate for residual contrast.  ABDOMEN - 1 VIEW  Comparison: 06/12/2011  Findings: Contrast material seen throughout nondistended colon. Minimal if any contrast in the stomach.  No small bowel dilatation. No supine evidence of free air.  IMPRESSION: Residual contrast, predominately throughout nondistended colon.  Original Report Authenticated By: Cyndie Chime, M.D.    Assessment: Large Hiatal Hernia   Procedure(s): LAPAROSCOPIC REPAIR OF HIATAL HERNIA INSERTION OF MESH  Plan:  1. Patient tolerating  clears, will advance to full liquids and monitor. 2. Encouraged pt to continue getting OOB and walking 3. Consider d/c in the next 1-2 days   LOS: 4 days    Jorge [name]  06/14/2011 8:28 AM

## 2011-06-14 NOTE — Progress Notes (Signed)
Advance  Diet.

## 2011-06-14 NOTE — Progress Notes (Signed)
Patient ID: Jorge [name], male   DOB: [birth year], 78 y.o.   MRN: [id number]                   [address]            [place] [address]          [phone]     4 Days Post-Op Procedure(s) (LRB): LAPAROSCOPIC REPAIR OF HIATAL HERNIA (N/A) INSERTION OF MESH (N/A)  LOS: 4 days   Subjective: No complaints, walking well  Objective: Vital signs in last 24 hours: Patient Vitals for the past 24 hrs:  BP Temp Temp src Pulse Resp SpO2  06/14/11 1332 143/68 mmHg 98.8 F (37.1 C) - 91  18  94 %  06/14/11 1037 139/71 mmHg 98 F (36.7 C) - 78  20  96 %  06/14/11 0541 128/78 mmHg 98.6 F (37 C) Oral 79  18  93 %  06/14/11 0138 131/71 mmHg 99 F (37.2 C) Oral 79  18  94 %  06/13/11 2124 139/80 mmHg 98 F (36.7 C) Oral 79  20  95 %  06/13/11 1826 123/96 mmHg 99.5 F (37.5 C) Oral 80  18  96 %    Filed Weights   06/10/11 1555  Weight: 207 lb 0.2 oz (93.9 kg)    Hemodynamic parameters for last 24 hours:    Intake/Output from previous day: 03/11 0701 - 03/12 0700 In: 2223 [P.O.:360; I.V.:1863] Out: 800 [Urine:800] Intake/Output this shift: Total I/O In: 1113 [P.O.:360; I.V.:753] Out: -   Scheduled Meds:   . heparin  5,000 Units Subcutaneous Q8H   Continuous Infusions:   . dextrose 5 % and 0.45 % NaCl with KCl 20 mEq/L 100 mL/hr at 06/14/11 1117   PRN Meds:.morphine, ondansetron (ZOFRAN) IV  General appearance: alert, cooperative and no distress Neurologic: intact Abdomen: soft, non-tender; bowel sounds normal; no masses,  no organomegaly Extremities: extremities normal, atraumatic, no cyanosis or edema and Homans sign is negative, no sign of DVT  Lab Results: CBC: Basename 06/12/11 0538 06/11/11 1800  WBC 10.1 --  HGB 12.4* 13.4  HCT 37.8* 40.4  PLT 130* --   BMET: No results found for this basename: NA:2,K:2,CL:2,CO2:2,GLUCOSE:2,BUN:2,CREATININE:2,CALCIUM:2 in the last 72 hours  PT/INR: No results found for this basename: LABPROT,INR in the  last 72 hours   Radiology Dg Abd 1 View  06/13/2011  *RADIOLOGY REPORT*  Clinical Data: Evaluate for residual contrast.  ABDOMEN - 1 VIEW  Comparison: 06/12/2011  Findings: Contrast material seen throughout nondistended colon. Minimal if any contrast in the stomach.  No small bowel dilatation. No supine evidence of free air.  IMPRESSION: Residual contrast, predominately throughout nondistended colon.  Original Report Authenticated By: Cyndie Chime, M.D.     Assessment/Plan: S/P Procedure(s) (LRB): LAPAROSCOPIC REPAIR OF HIATAL HERNIA (N/A) INSERTION OF MESH (N/A) Improving , talking some diet No nausea or vomiting   [name] 06/14/2011 4:47 PM

## 2011-06-14 NOTE — Progress Notes (Signed)
UR of chart complete.  

## 2011-06-15 NOTE — Discharge Instructions (Signed)
[facility] ______[facility] LAPAROSCOPIC SURGERY: POST OP INSTRUCTIONS Always review your discharge instruction sheet given to you by the facility where your surgery was performed. IF YOU HAVE DISABILITY OR FAMILY LEAVE FORMS, YOU MUST BRING THEM TO THE OFFICE FOR PROCESSING.   DO NOT GIVE THEM TO YOUR DOCTOR.  1. A prescription for pain medication may be given to you upon discharge.  Take your pain medication as prescribed, if needed.  If narcotic pain medicine is not needed, then you may take acetaminophen (Tylenol) or ibuprofen (Advil) as needed. 2. Take your usually prescribed medications unless otherwise directed. 3. If you need a refill on your pain medication, please contact your pharmacy.  They will contact our office to request authorization. Prescriptions will not be filled after 5pm or on week-ends. 4. You should follow a soft diet the first few weeks after arrival home.  Be sure to include lots of fluids daily. 5. Most patients will experience some swelling and bruising in the area of the incisions.  Ice packs will help.  Swelling and bruising can take several days to resolve.  6. It is common to experience some constipation if taking pain medication after surgery.  Increasing fluid intake and taking a stool softener (such as Colace) will usually help or prevent this problem from occurring.  A mild laxative (Milk of Magnesia or Miralax) should be taken according to package instructions if there are no bowel movements after 48 hours. 7. Unless discharge instructions indicate otherwise, you may remove your bandages 24-48 hours after surgery, and you may shower at that time.  You may have steri-strips (small skin tapes) in place directly over the incision.  These strips should be left on the skin for 7-10 days.  If your surgeon used skin glue on the incision, you may shower in 24 hours.  The glue will flake off over the next 2-3 weeks.  Any sutures or staples will be removed at the  office during your follow-up visit. 8. ACTIVITIES:  You may resume regular (light) daily activities beginning the next day--such as daily self-care, walking, climbing stairs--gradually increasing activities as tolerated.  You may have sexual intercourse when it is comfortable.  Refrain from any heavy lifting or straining until approved by your doctor. a. You may drive when you are no longer taking prescription pain medication, you can comfortably wear a seatbelt, and you can safely maneuver your car and apply brakes. b. RETURN TO WORK:  __________________________________________________________ 9. You should see your doctor in the office for a follow-up appointment approximately 2-3 weeks after your surgery.  Make sure that you call for this appointment within a day or two after you arrive home to insure a convenient appointment time. 10. OTHER INSTRUCTIONS: __________________________________________________________________________________________________________________________ __________________________________________________________________________________________________________________________ WHEN TO CALL YOUR DOCTOR: 1. Fever over 101.0 2. Inability to urinate 3. Continued bleeding from incision. 4. Increased pain, redness, or drainage from the incision. 5. Increasing abdominal pain  The clinic staff is available to answer your questions during regular business hours.  Please don't hesitate to call and ask to speak to one of the nurses for clinical concerns.  If you have a medical emergency, go to the nearest emergency room or call 911.  A surgeon from [facility] is always on call at the hospital. [address], [place], [place]  [address] ? [address] [address], [place], [place]   [address] [phone] ? [phone] ? FAX [phone] Web site: [email or web address]

## 2011-06-15 NOTE — Progress Notes (Signed)
5 Days Post-Op  Subjective: Pt looks great, sitting in bed.  Did well with full liquids, would like to try more. Denies N/V, bloating.  Has had multiple BMs, passing gas. Would like to go home as soon as he can.     Objective: Vital signs in last 24 hours: Temp:  [98 F (36.7 C)-99.3 F (37.4 C)] 98.2 F (36.8 C) (03/13 0500) Pulse Rate:  [71-91] 71  (03/13 0500) Resp:  [18-20] 18  (03/13 0500) BP: (123-143)/(68-72) 133/70 mmHg (03/13 0500) SpO2:  [93 %-97 %] 93 % (03/13 0500) Last BM Date: 06/14/11   Physical Exam: BP 133/70  Pulse 71  Temp(Src) 98.2 F (36.8 C) (Oral)  Resp 18  Ht 6\' 1"  (1.854 m)  Wt 207 lb 0.2 oz (93.9 kg)  BMI 27.31 kg/m2  SpO2 93% Pulm: CTA bilaterally, no wheezes CV: RRR, no murmur Abdomen: soft, ND, minimal tenderness, +BS, Incisions c/d/i  Labs: CBC No results found for this basename: WBC:2,HGB:2,HCT:2,PLT:2 in the last 72 hours BMET No results found for this basename: NA:2,K:2,CL:2,CO2:2,GLUCOSE:2,BUN:2,CREATININE:2,CALCIUM:2 in the last 72 hours LFT No results found for this basename: PROT,ALBUMIN,AST,ALT,ALKPHOS,BILITOT,BILIDIR,IBILI,LIPASE in the last 72 hours PT/INR No results found for this basename: LABPROT:2,INR:2 in the last 72 hours ABG No results found for this basename: PHART:2,PCO2:2,PO2:2,HCO3:2 in the last 72 hours  Studies/Results: No results found.  Assessment: Large Hiatal Hernia   Procedure(s): LAPAROSCOPIC REPAIR OF HIATAL HERNIA INSERTION OF MESH  Plan:  1. Patient tolerating full liquids, advance to regular diet 2. Encouraged pt to continue getting OOB and walking 3. Consider d/c this PM if patient tolerates advancing diet (which he likely will).    LOS: 5 days    Trezure Cronk  06/15/2011 7:57 AM

## 2011-06-15 NOTE — Progress Notes (Signed)
LOOKS WELL

## 2011-06-15 NOTE — Discharge Summary (Signed)
Physician Discharge Summary  Patient ID: Jorge [name] MRN: [id number] DOB/AGE: 1929-05-04 78 y.o.  Admit date: 06/10/2011 Discharge date: 06/15/2011  Admission Diagnoses: Large Irreducible Hiatal Hernia Discharge Diagnoses:  Active Problems:  Irreducible hiatal hernia    Procedures: Procedure(s): LAPAROSCOPIC REPAIR OF HIATAL HERNIA INSERTION OF MESH  Discharged Condition: good  Hospital Course: History of Present Illness: Jorge [name] is an 78 y.o. male who was admitted last week by Dr. Andrey Campanile with a symptomatic mixed hiatus hernia that is at least a type III if not IV. He has had this for many years first noted when he was a young man by Dr. Noland Fordyce but he has been minimally symptomatic. At this admission he was seen by Dr. Tyrone Sage and me and informed consent was obtained about abdominal or transthoracic approaches to this hiatal hernia. He was discharged two days ago to report back today for repair. He did have recurrent pain yesterday.  He was taken to the OR on 06/10/11 and under successful lap hiatal hernia repair with mesh implant.  His NG remained for a few days. He had an UGI study that showed mild delay in his stomach emptying, but after a few days, he was doing well on po liquid diet.  His bowels began to function well and his diet was advance up to soft diet. He had no other issues or complications to his hospital course. His incisions look fine and require no dressings. He is determined stable for discharge today, 06/16/11   Consults: Thoracic Surgery-Dr. Tyrone Sage.   Discharge Exam: Blood pressure 141/86, pulse 72, temperature 98.2 F (36.8 C), temperature source Oral, resp. rate 18, height 6\' 1"  (1.854 m), weight 93.9 kg (207 lb 0.2 oz), SpO2 94.00%. Lungs: CTA without w/r/r Heart: Regular Abdomen: soft, ND, appropriately tender   Incisions all c/d/i without erythema or hematoma. Ext: No edema or tenderness   Disposition: 01-Home or Self Care  Discharge Orders     Future Appointments: Provider: Department: Dept Phone: Center:   09/12/2011 10:45 AM Tresa Garter, MD [facility] [phone] [facility]     Future Orders Please Complete By Expires   Diet - low sodium heart healthy      Comments:   Try to keep to soft foods.   Increase activity slowly      May shower / Bathe      May walk up steps      No dressing needed      Call MD for:  redness, tenderness, or signs of infection (pain, swelling, redness, odor or green/yellow discharge around incision site)      Call MD for:  severe uncontrolled pain      Call MD for:  persistant nausea and vomiting      Call MD for:  temperature >100.4        Medication List  As of 06/15/2011  2:16 PM   STOP taking these medications         cholecalciferol 1000 UNITS tablet         TAKE these medications         diphenhydramine-acetaminophen 25-500 MG Tabs   Commonly known as: TYLENOL PM   Take 1 tablet by mouth at bedtime as needed. For sleep      Oxycodone HCl 10 MG Tabs   Take 10 mg by mouth 4 (four) times daily as needed. For pain.      Simethicone 125 MG Caps   Take 125 mg by mouth 2 (two) times  daily as needed. For gas and bloating.      terazosin 2 MG capsule   Commonly known as: HYTRIN   Take 2 mg by mouth at bedtime.      tetrahydrozoline-zinc 0.05-0.25 % ophthalmic solution   Commonly known as: VISINE-AC   Place 2 drops into both eyes 3 (three) times daily as needed. For allergies      zolpidem 5 MG tablet   Commonly known as: AMBIEN   Take 5 mg by mouth at bedtime as needed. For insomnia.           Follow-up Information    Follow up with Luretha Murphy B, MD. Schedule an appointment as soon as possible for a visit in 2 weeks.   Contact information:   [employer] [address], [address] [place] [address] [phone]          Signed: Alyse Low 06/15/2011, 2:16 PM

## 2011-06-15 NOTE — Progress Notes (Signed)
DC home. No verbal concerns about DC instructions.

## 2011-06-15 NOTE — Discharge Summary (Signed)
Discharge home.

## 2011-06-21 ENCOUNTER — Encounter (HOSPITAL_COMMUNITY): Payer: Self-pay | Admitting: Surgery

## 2011-07-12 ENCOUNTER — Encounter (INDEPENDENT_AMBULATORY_CARE_PROVIDER_SITE_OTHER): Payer: Self-pay | Admitting: Surgery

## 2011-07-13 ENCOUNTER — Encounter (INDEPENDENT_AMBULATORY_CARE_PROVIDER_SITE_OTHER): Payer: Self-pay | Admitting: Surgery

## 2011-07-13 ENCOUNTER — Ambulatory Visit (INDEPENDENT_AMBULATORY_CARE_PROVIDER_SITE_OTHER): Payer: Medicare Other | Admitting: Surgery

## 2011-07-13 VITALS — BP 137/80 | HR 69 | Temp 98.4°F | Ht 73.5 in | Wt 185.6 lb

## 2011-07-13 DIAGNOSIS — K449 Diaphragmatic hernia without obstruction or gangrene: Secondary | ICD-10-CM

## 2011-07-13 NOTE — Progress Notes (Signed)
Jorge [name] 78 y.o.  Body mass index is 24.16 kg/(m^2).  Patient Active Problem List  Diagnoses  . VITAMIN D DEFICIENCY  . ANXIETY  . INSOMNIA, PERSISTENT  . CERUMEN IMPACTION  . HYPERTENSION  . GERD  . PEPTIC ULCER DISEASE  . BENIGN PROSTATIC HYPERTROPHY  . ERECTILE DYSFUNCTION  . Diarrhea  . CERVICAL STRAIN  . PERSONAL HX COLON CANCER  . PROSTATE CANCER, HX OF  . COLONIC POLYPS, HX OF  . HIP PAIN  . Neoplasm of uncertain behavior of skin  . LUQ abdominal pain  . Constipation  . Chest pain, atypical  . Irreducible hiatal hernia  . Nonspecific (abnormal) findings on radiological and other examination of gastrointestinal tract    Allergies  Allergen Reactions  . Aspirin Other (See Comments)    Ulcerated stomach  . Azithromycin   . Cefuroxime Axetil   . Iodine Other (See Comments)    Per pt tremors and dyspnea  . Naproxen     REACTION: upset stomach - like with other NSAIDs  . Penicillins     Pt reports allergy to penicillins     Past Surgical History  Procedure Date  . Transurethral resection of prostate   . Appendectomy     age 68  . Esophagogastroduodenoscopy 06/05/2011    Procedure: ESOPHAGOGASTRODUODENOSCOPY (EGD);  Surgeon: Iva Boop, MD;  Location: [facility] ENDOSCOPY;  Service: Endoscopy;  Laterality: N/A;  . Hiatal hernia repair 06/10/2011    Procedure: LAPAROSCOPIC REPAIR OF HIATAL HERNIA;  Surgeon: Valarie Merino, MD;  Location: [facility] OR;  Service: General;  Laterality: N/A;  Laparoscopic repair of paraesophageal hernia.  [name] Hernia repair 06/10/2011    hiatal hernia   Sonda Primes, MD, MD No diagnosis found.  Jorge [name] appears to be doing very well. He looks healthy and he is eating without difficulty. His incisions have healed nicely and he is not having any GERD. He is doing well and I will see him prn.   Matt B. Daphine Deutscher, MD, [facility] [phone] beeper [phone]  07/13/2011 2:00 PM

## 2011-09-12 ENCOUNTER — Ambulatory Visit (INDEPENDENT_AMBULATORY_CARE_PROVIDER_SITE_OTHER): Payer: Medicare Other | Admitting: Internal Medicine

## 2011-09-12 ENCOUNTER — Other Ambulatory Visit: Payer: Self-pay | Admitting: Internal Medicine

## 2011-09-12 ENCOUNTER — Encounter: Payer: Self-pay | Admitting: Internal Medicine

## 2011-09-12 VITALS — BP 140/72 | HR 72 | Temp 97.6°F | Resp 16 | Wt 196.0 lb

## 2011-09-12 DIAGNOSIS — R1012 Left upper quadrant pain: Secondary | ICD-10-CM

## 2011-09-12 DIAGNOSIS — R202 Paresthesia of skin: Secondary | ICD-10-CM

## 2011-09-12 DIAGNOSIS — G4700 Insomnia, unspecified: Secondary | ICD-10-CM

## 2011-09-12 DIAGNOSIS — K449 Diaphragmatic hernia without obstruction or gangrene: Secondary | ICD-10-CM | POA: Diagnosis not present

## 2011-09-12 DIAGNOSIS — F411 Generalized anxiety disorder: Secondary | ICD-10-CM

## 2011-09-12 DIAGNOSIS — Z8546 Personal history of malignant neoplasm of prostate: Secondary | ICD-10-CM

## 2011-09-12 DIAGNOSIS — K219 Gastro-esophageal reflux disease without esophagitis: Secondary | ICD-10-CM

## 2011-09-12 DIAGNOSIS — I10 Essential (primary) hypertension: Secondary | ICD-10-CM

## 2011-09-12 DIAGNOSIS — E559 Vitamin D deficiency, unspecified: Secondary | ICD-10-CM | POA: Diagnosis not present

## 2011-09-12 DIAGNOSIS — R209 Unspecified disturbances of skin sensation: Secondary | ICD-10-CM

## 2011-09-12 MED ORDER — TRIAZOLAM 0.25 MG PO TABS: 0.2500 mg | ORAL_TABLET | Freq: Every evening | ORAL | Status: DC | PRN

## 2011-09-12 NOTE — Assessment & Plan Note (Signed)
Resolved after surgery

## 2011-09-12 NOTE — Assessment & Plan Note (Signed)
Monitoring PSA 

## 2011-09-12 NOTE — Assessment & Plan Note (Signed)
Continue with current prescription therapy as reflected on the Med list.  

## 2011-09-12 NOTE — Assessment & Plan Note (Signed)
D/c ambien. Start Halcion

## 2011-09-12 NOTE — Progress Notes (Signed)
   Subjective:    Patient ID: Jorge [name], male    DOB: 1929/05/14, 78 y.o.   MRN: [id number]  HPI   F/u on  severe (10/10) LUQ abd pain - no relapse following surgery in 3/13. No R CP. F/u HH, prostate ca C/o insomnia, bad  Wt Readings from Last 3 Encounters:  09/12/11 196 lb (88.905 kg)  07/13/11 185 lb 9.6 oz (84.188 kg)  06/10/11 207 lb 0.2 oz (93.9 kg)   BP Readings from Last 3 Encounters:  09/12/11 140/72  07/13/11 137/80  06/15/11 141/86       Review of Systems  Constitutional: Negative for fever, chills, diaphoresis, appetite change, fatigue and unexpected weight change.  HENT: Negative for nosebleeds, congestion, sore throat, sneezing, trouble swallowing and neck pain.   Eyes: Negative for itching and visual disturbance.  Respiratory: Negative for cough, choking, chest tightness and wheezing.   Cardiovascular: Negative for chest pain, palpitations and leg swelling.  Gastrointestinal: Negative for nausea, vomiting, abdominal pain, diarrhea, constipation, blood in stool, abdominal distention, anal bleeding and rectal pain.  Genitourinary: Negative for frequency and hematuria.  Musculoskeletal: Negative for back pain, joint swelling and gait problem.  Skin: Negative for rash.  Neurological: Negative for dizziness, tremors, speech difficulty and weakness.  Psychiatric/Behavioral: Negative for sleep disturbance, dysphoric mood and agitation. The patient is not nervous/anxious.        Objective:   Physical Exam  Constitutional: He is oriented to person, place, and time. He appears well-developed. No distress.       NAD Looks well  HENT:  Mouth/Throat: Oropharynx is clear and moist.  Eyes: Conjunctivae are normal. Pupils are equal, round, and reactive to light.  Neck: Normal range of motion. No JVD present. No thyromegaly present.  Cardiovascular: Normal rate, regular rhythm, normal heart sounds and intact distal pulses.  Exam reveals no gallop and no friction  rub.   No murmur heard. Pulmonary/Chest: Effort normal and breath sounds normal. No respiratory distress. He has no wheezes. He has no rales. He exhibits no tenderness.  Abdominal: Soft. Bowel sounds are normal. He exhibits no distension and no mass. There is no tenderness (LUQ is sensitive to palp). There is no rebound and no guarding.  Musculoskeletal: Normal range of motion. He exhibits no edema and no tenderness.  Lymphadenopathy:    He has no cervical adenopathy.  Neurological: He is alert and oriented to person, place, and time. He has normal reflexes. No cranial nerve deficit. He exhibits normal muscle tone. Coordination normal.  Skin: Skin is warm and dry. No rash noted. He is not diaphoretic.  Psychiatric: He has a normal mood and affect. His behavior is normal. Judgment and thought content normal.    Lab Results  Component Value Date   WBC 10.1 06/12/2011   HGB 12.4* 06/12/2011   HCT 37.8* 06/12/2011   PLT 130* 06/12/2011   GLUCOSE 127* 06/03/2011   CHOL 167 01/10/2007   TRIG 89 01/10/2007   HDL 47.7 01/10/2007   LDLCALC 102* 01/10/2007   ALT 11 06/03/2011   AST 17 06/03/2011   NA 138 06/03/2011   K 4.2 06/03/2011   CL 105 06/03/2011   CREATININE 0.85 06/10/2011   BUN 10 06/03/2011   CO2 28 06/03/2011   TSH 1.41 11/19/2009   PSA 0.46 01/10/2007   INR 1.06 06/03/2011   HGBA1C 6.1* 01/10/2007          Assessment & Plan:

## 2011-09-12 NOTE — Assessment & Plan Note (Signed)
Doing better.   

## 2011-09-12 NOTE — Assessment & Plan Note (Signed)
Gastroplexy 3/13 Dr Daphine Deutscher, s/p Doing well

## 2011-09-14 ENCOUNTER — Telehealth: Payer: Self-pay | Admitting: *Deleted

## 2011-09-14 NOTE — Telephone Encounter (Signed)
I did not get what the message is about OK to fill this Zolpidem prescription with additional refills x5 Thank you!

## 2011-09-14 NOTE — Telephone Encounter (Signed)
zolpidem (AMBIEN) 10 MG tablet [Pharmacy Med Name: ZOLPIDEM 10MG  TAB] 09/12/2011 Sig: TAKE ONE TABLET BY MOUTH AT BEDTIME AS NEEDED FOR SLEEP Class: Normal DAW: No Reason for Refusal: Refill not appropriate Reason for Refusal Comment: waiting on MD approval Refused By: Merrilyn Puma, CMA

## 2011-09-15 MED ORDER — ZOLPIDEM TARTRATE 10 MG PO TABS: 10.0000 mg | ORAL_TABLET | Freq: Every evening | ORAL | Status: AC | PRN

## 2011-09-15 NOTE — Telephone Encounter (Signed)
Done

## 2011-09-22 ENCOUNTER — Ambulatory Visit (INDEPENDENT_AMBULATORY_CARE_PROVIDER_SITE_OTHER): Payer: Medicare Other | Admitting: Internal Medicine

## 2011-09-22 ENCOUNTER — Telehealth: Payer: Self-pay | Admitting: Internal Medicine

## 2011-09-22 ENCOUNTER — Encounter: Payer: Self-pay | Admitting: Internal Medicine

## 2011-09-22 VITALS — BP 150/90 | HR 75 | Temp 97.8°F | Ht 73.5 in | Wt 194.5 lb

## 2011-09-22 DIAGNOSIS — I10 Essential (primary) hypertension: Secondary | ICD-10-CM | POA: Diagnosis not present

## 2011-09-22 DIAGNOSIS — F411 Generalized anxiety disorder: Secondary | ICD-10-CM | POA: Diagnosis not present

## 2011-09-22 DIAGNOSIS — L259 Unspecified contact dermatitis, unspecified cause: Secondary | ICD-10-CM | POA: Diagnosis not present

## 2011-09-22 MED ORDER — TRIAMCINOLONE ACETONIDE 0.1 % EX CREA: TOPICAL_CREAM | Freq: Two times a day (BID) | CUTANEOUS | Status: DC

## 2011-09-22 MED ORDER — METHYLPREDNISOLONE ACETATE 80 MG/ML IJ SUSP
120.0000 mg | Freq: Once | INTRAMUSCULAR | Status: AC
  Administered 2011-09-22: 120 mg via INTRAMUSCULAR

## 2011-09-22 NOTE — Telephone Encounter (Signed)
APPT TODAY

## 2011-09-22 NOTE — Progress Notes (Signed)
Subjective:    Patient ID: Jorge [name], male    DOB: 04/09/1929, 78 y.o.   MRN: [id number]  HPI  Here after working in the yard with mild onset 2-3 days pruritic rash to mutl areas that seem to spread, to the arms, neck and now face today with large swelling of the neck noted as well.  Pt denies chest pain, increased sob or doe, wheezing, orthopnea, PND, increased LE swelling, palpitations, dizziness or syncope.   Pt denies polydipsia, polyuria.  Pt denies new neurological symptoms such as new headache, or facial or extremity weakness or numbness  Denies worsening depressive symptoms, suicidal ideation, or panic, though has ongoing anxiety, not increased recently.  Past Medical History  Diagnosis Date  . Anxiety   . GERD (gastroesophageal reflux disease)   . Hypertension   . Gastric ulcer     Dr. Christella Hartigan  . ED (erectile dysfunction)   . History of colonic polyps   . Prostate cancer 2010    Dr. Lenn Sink  . Vitamin d deficiency   . Insomnia   . Hernia, hiatal   . Syncope 2011    from Bicalutamide   Past Surgical History  Procedure Date  . Transurethral resection of prostate   . Appendectomy     age 43  . Esophagogastroduodenoscopy 06/05/2011    Procedure: ESOPHAGOGASTRODUODENOSCOPY (EGD);  Surgeon: Iva Boop, MD;  [facility] ENDOSCOPY;  Service: Endoscopy;  Laterality: N/A;  . Hiatal hernia repair 06/10/2011    Procedure: LAPAROSCOPIC REPAIR OF HIATAL HERNIA;  Surgeon: Valarie Merino, MD;  Location: [facility] OR;  Service: General;  Laterality: N/A;  Laparoscopic repair of paraesophageal hernia.  [name] Hernia repair 06/10/2011    hiatal hernia    reports that he has quit smoking. He does not have any smokeless tobacco history on file. He reports that he drinks alcohol. He reports that he does not use illicit drugs. family history includes Cancer in his father; Hypertension in his father and other; and Pancreatic cancer in his father. Allergies  Allergen Reactions  . Aspirin Other  (See Comments)    Ulcerated stomach  . Azithromycin   . Cefuroxime Axetil   . Iodine Other (See Comments)    Per pt tremors and dyspnea  . Naproxen     REACTION: upset stomach - like with other NSAIDs  . Penicillins     Pt reports allergy to penicillins    Current Outpatient Prescriptions on File Prior to Visit  Medication Sig Dispense Refill  . diphenhydramine-acetaminophen (TYLENOL PM) 25-500 MG TABS Take 1 tablet by mouth at bedtime as needed. For sleep      . Oxycodone HCl 10 MG TABS Take 10 mg by mouth 4 (four) times daily as needed. For pain.      . Simethicone 125 MG CAPS Take 125 mg by mouth 2 (two) times daily as needed. For gas and bloating.      . terazosin (HYTRIN) 2 MG capsule Take 2 mg by mouth at bedtime.      [name] tetrahydrozoline-zinc (VISINE-AC) 0.05-0.25 % ophthalmic solution Place 2 drops into both eyes 3 (three) times daily as needed. For allergies      . triazolam (HALCION) 0.25 MG tablet Take 1 tablet (0.25 mg total) by mouth at bedtime as needed.  30 tablet  3  . zolpidem (AMBIEN) 10 MG tablet Take 1 tablet (10 mg total) by mouth at bedtime as needed for sleep.  30 tablet  5   No current  facility-administered medications on file prior to visit.   Review of Systems Review of Systems  Constitutional: Negative for diaphoresis and unexpected weight change.  HENT: Negative for drooling and tinnitus.   Eyes: Negative for photophobia and visual disturbance.  Respiratory: Negative for choking and stridor.   Gastrointestinal: Negative for vomiting and blood in stool.   Musculoskeletal: Negative for gait problem.  Neurological: Negative for tremors and numbness.  Psychiatric/Behavioral: Negative for decreased concentration. The patient is not hyperactive.       Objective:   Physical Exam BP 150/90  Pulse 75  Temp 97.8 F (36.6 C) (Oral)  Ht 6' 1.5" (1.867 m)  Wt 194 lb 8 oz (88.225 kg)  BMI 25.31 kg/m2  SpO2 95% Physical Exam  VS noted Constitutional: Pt  appears well-developed and well-nourished.  HENT: Head: Normocephalic.  Right Ear: External ear normal.  Left Ear: External ear normal.  Eyes: Conjunctivae and EOM are normal. Pupils are equal, round, and reactive to light.  Neck: Normal range of motion. Neck supple.  Cardiovascular: Normal rate and regular rhythm.   Pulmonary/Chest: Effort normal and breath sounds normal.  Neurological: Pt is alert. Not confused Skin: Skin is warm. No erythema. except for mult small areas typical poison ivy type rash to arms, ant mid neck and right face Psychiatric: Pt behavior is normal. Thought content normal. 1+ nervous    Assessment & Plan:

## 2011-09-22 NOTE — Assessment & Plan Note (Signed)
stable overall by hx and exam, and pt to continue medical treatment as before 

## 2011-09-22 NOTE — Assessment & Plan Note (Signed)
Mild to mod, for depomedro IM and triam cr prn topical,  to f/u any worsening symptoms or concerns

## 2011-09-22 NOTE — Telephone Encounter (Signed)
Pt has poison ivy and would like to be worked in today.

## 2011-09-22 NOTE — Patient Instructions (Addendum)
You had the steroid shot today Take all new medications as prescribed - the cream Continue all other medications as before

## 2011-09-22 NOTE — Assessment & Plan Note (Signed)
Mild elev likely situational, but stable overall by hx and exam, most recent data reviewed with pt, and pt to continue medical treatment as before' BP Readings from Last 3 Encounters:  09/22/11 150/90  09/12/11 140/72  07/13/11 137/80

## 2011-09-22 NOTE — Telephone Encounter (Signed)
Ok with me 

## 2011-09-26 ENCOUNTER — Other Ambulatory Visit: Payer: Self-pay | Admitting: Internal Medicine

## 2011-09-29 ENCOUNTER — Ambulatory Visit (INDEPENDENT_AMBULATORY_CARE_PROVIDER_SITE_OTHER): Payer: Medicare Other | Admitting: Internal Medicine

## 2011-09-29 ENCOUNTER — Encounter: Payer: Self-pay | Admitting: Internal Medicine

## 2011-09-29 VITALS — BP 148/72 | HR 70 | Temp 98.7°F | Resp 16 | Wt 198.0 lb

## 2011-09-29 DIAGNOSIS — L259 Unspecified contact dermatitis, unspecified cause: Secondary | ICD-10-CM | POA: Diagnosis not present

## 2011-09-29 MED ORDER — METHYLPREDNISOLONE ACETATE 80 MG/ML IJ SUSP
120.0000 mg | Freq: Once | INTRAMUSCULAR | Status: AC
  Administered 2011-09-29: 120 mg via INTRAMUSCULAR

## 2011-09-29 MED ORDER — HYDROXYZINE HCL 10 MG PO TABS: 10.0000 mg | ORAL_TABLET | Freq: Three times a day (TID) | ORAL | Status: AC | PRN

## 2011-09-29 MED ORDER — CLOBETASOL PROPIONATE 0.05 % EX OINT: TOPICAL_OINTMENT | Freq: Two times a day (BID) | CUTANEOUS | Status: AC

## 2011-09-29 NOTE — Assessment & Plan Note (Signed)
He will try a more potent topical steroid with clobetasol ointment and hydroxyzine for the itching, also he was given an injection of depo-medrol IM

## 2011-09-29 NOTE — Patient Instructions (Signed)
Poison Ivy Poison ivy is a inflammation of the skin (contact dermatitis) caused by touching the allergens on the leaves of the ivy plant following previous exposure to the plant. The rash usually appears 48 hours after exposure. The rash is usually bumps (papules) or blisters (vesicles) in a linear pattern. Depending on your own sensitivity, the rash may simply cause redness and itching, or it may also progress to blisters which may break open. These must be well cared for to prevent secondary bacterial (germ) infection, followed by scarring. Keep any open areas dry, clean, dressed, and covered with an antibacterial ointment if needed. The eyes may also get puffy. The puffiness is worst in the morning and gets better as the day progresses. This dermatitis usually heals without scarring, within 2 to 3 weeks without treatment. HOME CARE INSTRUCTIONS  Thoroughly wash with soap and water as soon as you have been exposed to poison ivy. You have about one half hour to remove the plant resin before it will cause the rash. This washing will destroy the oil or antigen on the skin that is causing, or will cause, the rash. Be sure to wash under your fingernails as any plant resin there will continue to spread the rash. Do not rub skin vigorously when washing affected area. Poison ivy cannot spread if no oil from the plant remains on your body. A rash that has progressed to weeping sores will not spread the rash unless you have not washed thoroughly. It is also important to wash any clothes you have been wearing as these may carry active allergens. The rash will return if you wear the unwashed clothing, even several days later. Avoidance of the plant in the future is the best measure. Poison ivy plant can be recognized by the number of leaves. Generally, poison ivy has three leaves with flowering branches on a single stem. Diphenhydramine may be purchased over the counter and used as needed for itching. Do not drive with  this medication if it makes you drowsy.Ask your caregiver about medication for children. SEEK MEDICAL CARE IF:  Open sores develop.   Redness spreads beyond area of rash.   You notice purulent (pus-like) discharge.   You have increased pain.   Other signs of infection develop (such as fever).  Document Released: 03/18/2000 Document Revised: 03/10/2011 Document Reviewed: 02/04/2009 [employer] Patient Information 2012 [employer]. 

## 2011-09-29 NOTE — Addendum Note (Signed)
Addended by: Rock Nephew T on: 09/29/2011 04:20 PM   Modules accepted: Orders

## 2011-09-29 NOTE — Progress Notes (Signed)
  Subjective:    Patient ID: Jorge [name], male    DOB: 05-19-1929, 78 y.o.   MRN: [id number]  Rash This is a recurrent problem. The current episode started 1 to 4 weeks ago. The problem is unchanged. The affected locations include the left arm, right arm and right lower leg. The rash is characterized by itchiness and redness. He was exposed to plant contact. Pertinent negatives include no anorexia, congestion, cough, diarrhea, eye pain, facial edema, rhinorrhea, shortness of breath, sore throat or vomiting. Treatments tried: tac cream and benadryl.      Review of Systems  Constitutional: Negative.   HENT: Negative.  Negative for congestion, sore throat and rhinorrhea.   Eyes: Negative.  Negative for pain.  Respiratory: Negative.  Negative for cough and shortness of breath.   Cardiovascular: Negative.   Gastrointestinal: Negative.  Negative for vomiting, diarrhea and anorexia.  Genitourinary: Negative.   Musculoskeletal: Negative.   Skin: Positive for rash. Negative for color change, pallor and wound.  Neurological: Negative.   Hematological: Negative.   Psychiatric/Behavioral: Negative.        Objective:   Physical Exam  Vitals reviewed. Constitutional: He appears well-developed and well-nourished. No distress.  HENT:  Head: Normocephalic and atraumatic.  Mouth/Throat: Oropharynx is clear and moist. No oropharyngeal exudate.  Eyes: Conjunctivae are normal. Right eye exhibits no discharge. Left eye exhibits no discharge. No scleral icterus.  Neck: Normal range of motion. Neck supple. No JVD present. No tracheal deviation present. No thyromegaly present.  Cardiovascular: Normal rate, regular rhythm, normal heart sounds and intact distal pulses.  Exam reveals no gallop and no friction rub.   No murmur heard. Pulmonary/Chest: Effort normal and breath sounds normal. No stridor. No respiratory distress. He has no wheezes. He has no rales. He exhibits no tenderness.  Abdominal:  Soft. Bowel sounds are normal. He exhibits no distension and no mass. There is no tenderness. There is no rebound and no guarding.  Lymphadenopathy:    He has no cervical adenopathy.  Skin: Skin is warm, dry and intact. Rash noted. No abrasion, no petechiae and no purpura noted. Rash is macular. Rash is not papular, not nodular, not pustular, not vesicular and not urticarial. He is not diaphoretic. No pallor.          He has classic linear streaks and groups of scaly/erythematous papules on his arms and legs, there are no vesicles and no pustules and no erythematous streaking          Assessment & Plan:

## 2011-10-26 ENCOUNTER — Encounter: Payer: Self-pay | Admitting: Internal Medicine

## 2011-10-26 ENCOUNTER — Ambulatory Visit (INDEPENDENT_AMBULATORY_CARE_PROVIDER_SITE_OTHER): Payer: Medicare Other | Admitting: Internal Medicine

## 2011-10-26 VITALS — BP 158/60 | HR 76 | Temp 98.1°F | Resp 16 | Wt 198.0 lb

## 2011-10-26 DIAGNOSIS — D485 Neoplasm of uncertain behavior of skin: Secondary | ICD-10-CM

## 2011-10-26 DIAGNOSIS — D489 Neoplasm of uncertain behavior, unspecified: Secondary | ICD-10-CM

## 2011-10-26 DIAGNOSIS — L820 Inflamed seborrheic keratosis: Secondary | ICD-10-CM | POA: Diagnosis not present

## 2011-10-26 DIAGNOSIS — L570 Actinic keratosis: Secondary | ICD-10-CM

## 2011-10-26 NOTE — Progress Notes (Signed)
  Subjective:    Patient ID: Jorge [name], male    DOB: 1929-05-10, 78 y.o.   MRN: [id number]  HPI  Skin bx x 3 C/o rough spot on R nose  Review of Systems     Objective:   Physical Exam    Procedure Note :     Procedure :  Skin biopsy   Indication:  Changing mole (s ),  Suspicious lesion(s)   Risks including unsuccessful procedure , bleeding, infection, bruising, scar, a need for another complete procedure and others were explained to the patient in detail as well as the benefits. Informed consent was obtained and signed.   The patient was placed in a decubitus position.  Lesion #1 under L eye    measuring 6x4    mm   Skin over lesion #1  was prepped with Betadine and alcohol  and anesthetized with 0.5 cc of 2% lidocaine and epinephrine, using a 25-gauge 1 inch needle.  Shave biopsy with a sterile Dermablade was carried out in the usual fashion; the specimen has fragmentated. Hyfrecator was used to destroy the rest of the lesion potentially left behind and for hemostasis. Band-Aid was applied with antibiotic ointment.    Lesion #2 under R eye     measuring   11x4 mm   Skin over lesion #2  was prepped with Betadine and alcohol  and anesthetized with 1 cc of 2% lidocaine and epinephrine, using a 25-gauge 1 inch needle.  Shave biopsy with a sterile Dermablade was carried out in the usual fashion, the specimen has fragmentated. Hyfrecator was used to destroy the rest of the lesion potentially left behind and for hemostasis. Band-Aid was applied with antibiotic ointment.  Lesion #3 on  R posterior/medial ear  measuring 11x6  mm   Skin over lesion #3  was prepped with Betadine and alcohol  and anesthetized with 1 cc of 2% lidocaine and epinephrine, using a 25-gauge 1 inch needle.  Shave biopsy with a sterile Dermablade was carried out in the usual fashion. Hyfrecator was used to destroy the rest of the lesion potentially left behind and for hemostasis. Band-Aid was applied with  antibiotic ointment.   Tolerated well. Complications none.    Procedure Note :     Procedure : Electrosurgery   Indication:   Actinic keratosis(es)   Risks including unsuccessful procedure , bleeding, infection, bruising, scar, a need for a repeat  procedure and others were explained to the patient in detail as well as the benefits. Informed consent was obtained verbally.   1  lesion(s)  on R nose   was/were treated with a hyfrecator after a local anesthesia with 0.5 ml of Lidocaine . Band-Aid was applied and antibiotic ointment was given for a later use.   Tolerated well. Complications none.   Postprocedure instructions :     Keep the wounds clean. You can wash them with liquid soap and water. Pat dry with gauze or a Kleenex tissue  Before applying antibiotic ointment and a Band-Aid.   You need to report immediately  if  any signs of infection develop.           Assessment & Plan:

## 2011-10-26 NOTE — Patient Instructions (Addendum)
Postprocedure instructions :    A Band-Aid should be  changed twice daily. You can take a shower tomorrow.  Keep the wounds clean. You can wash them with liquid soap and water. Pat dry with gauze or a Kleenex tissue  Before applying antibiotic ointment and a Band-Aid.   You need to report immediately  if fever, chills or any signs of infection develop.    The biopsy results should be available in 1 -2 weeks. 

## 2011-10-26 NOTE — Assessment & Plan Note (Signed)
cauterized x1

## 2011-10-26 NOTE — Assessment & Plan Note (Signed)
Skin bx 

## 2011-10-27 ENCOUNTER — Telehealth: Payer: Self-pay | Admitting: Internal Medicine

## 2011-10-27 NOTE — Telephone Encounter (Signed)
Jorge [name], please, inform patient that the bx was ok - no cancer Thx

## 2011-10-28 NOTE — Telephone Encounter (Signed)
Pt informed

## 2011-11-11 DIAGNOSIS — N393 Stress incontinence (female) (male): Secondary | ICD-10-CM | POA: Diagnosis not present

## 2011-11-11 DIAGNOSIS — N529 Male erectile dysfunction, unspecified: Secondary | ICD-10-CM | POA: Diagnosis not present

## 2011-11-11 DIAGNOSIS — R82998 Other abnormal findings in urine: Secondary | ICD-10-CM | POA: Diagnosis not present

## 2011-11-11 DIAGNOSIS — C61 Malignant neoplasm of prostate: Secondary | ICD-10-CM | POA: Diagnosis not present

## 2011-12-19 DIAGNOSIS — Z961 Presence of intraocular lens: Secondary | ICD-10-CM | POA: Diagnosis not present

## 2012-01-12 ENCOUNTER — Ambulatory Visit (INDEPENDENT_AMBULATORY_CARE_PROVIDER_SITE_OTHER): Payer: Medicare Other | Admitting: Internal Medicine

## 2012-01-12 ENCOUNTER — Encounter: Payer: Self-pay | Admitting: Internal Medicine

## 2012-01-12 VITALS — BP 150/78 | HR 76 | Temp 97.7°F | Resp 16 | Wt 209.0 lb

## 2012-01-12 DIAGNOSIS — K219 Gastro-esophageal reflux disease without esophagitis: Secondary | ICD-10-CM

## 2012-01-12 DIAGNOSIS — H02409 Unspecified ptosis of unspecified eyelid: Secondary | ICD-10-CM

## 2012-01-12 DIAGNOSIS — L570 Actinic keratosis: Secondary | ICD-10-CM

## 2012-01-12 DIAGNOSIS — Z23 Encounter for immunization: Secondary | ICD-10-CM

## 2012-01-12 MED ORDER — RANITIDINE HCL 150 MG PO TABS: 150.0000 mg | ORAL_TABLET | Freq: Two times a day (BID) | ORAL | Status: DC

## 2012-01-12 MED ORDER — PANCRELIPASE (LIP-PROT-AMYL) 36000-114000 UNITS PO CPEP: 1.0000 | ORAL_CAPSULE | Freq: Three times a day (TID) | ORAL | Status: DC

## 2012-01-12 NOTE — Assessment & Plan Note (Signed)
10/13 L>R - vision defects are present Ophth cons Dr Nile Riggs

## 2012-01-12 NOTE — Progress Notes (Signed)
Subjective:    Patient ID: Jorge [name], male    DOB: 15-May-1929, 78 y.o.   MRN: [id number]  HPI    F/u HH, HTN, prostate ca F/u on insomnia - better C/o AK - face C/o GERD and indigestion C/o visio problems due to droopy eyelids L>R  Wt Readings from Last 3 Encounters:  01/12/12 209 lb (94.802 kg)  10/26/11 198 lb (89.812 kg)  09/29/11 198 lb (89.812 kg)   BP Readings from Last 3 Encounters:  01/12/12 150/78  10/26/11 158/60  09/29/11 148/72       Review of Systems  Constitutional: Negative for fever, chills, diaphoresis, appetite change, fatigue and unexpected weight change.  HENT: Negative for nosebleeds, congestion, sore throat, sneezing, trouble swallowing and neck pain.   Eyes: Negative for itching and visual disturbance.  Respiratory: Negative for cough, choking, chest tightness and wheezing.   Cardiovascular: Negative for chest pain, palpitations and leg swelling.  Gastrointestinal: Negative for nausea, vomiting, abdominal pain, diarrhea, constipation, blood in stool, abdominal distention, anal bleeding and rectal pain.  Genitourinary: Negative for frequency and hematuria.  Musculoskeletal: Negative for back pain, joint swelling and gait problem.  Skin: Negative for rash.  Neurological: Negative for dizziness, tremors, speech difficulty and weakness.  Psychiatric/Behavioral: Negative for disturbed wake/sleep cycle, dysphoric mood and agitation. The patient is not nervous/anxious.        Objective:   Physical Exam  Constitutional: He is oriented to person, place, and time. He appears well-developed. No distress.       NAD Looks well  HENT:  Mouth/Throat: Oropharynx is clear and moist.  Eyes: Conjunctivae normal are normal. Pupils are equal, round, and reactive to light.  Neck: Normal range of motion. No JVD present. No thyromegaly present.  Cardiovascular: Normal rate, regular rhythm, normal heart sounds and intact distal pulses.  Exam reveals no  gallop and no friction rub.   No murmur heard. Pulmonary/Chest: Effort normal and breath sounds normal. No respiratory distress. He has no wheezes. He has no rales. He exhibits no tenderness.  Abdominal: Soft. Bowel sounds are normal. He exhibits no distension and no mass. There is no tenderness (LUQ is sensitive to palp). There is no rebound and no guarding.  Musculoskeletal: Normal range of motion. He exhibits no edema and no tenderness.  Lymphadenopathy:    He has no cervical adenopathy.  Neurological: He is alert and oriented to person, place, and time. He has normal reflexes. No cranial nerve deficit. He exhibits normal muscle tone. Coordination normal.  Skin: Skin is warm and dry. No rash noted. He is not diaphoretic.  Psychiatric: He has a normal mood and affect. His behavior is normal. Judgment and thought content normal.  AKs face, R arm Droopy eyelids B L>R  Lab Results  Component Value Date   WBC 10.1 06/12/2011   HGB 12.4* 06/12/2011   HCT 37.8* 06/12/2011   PLT 130* 06/12/2011   GLUCOSE 127* 06/03/2011   CHOL 167 01/10/2007   TRIG 89 01/10/2007   HDL 47.7 01/10/2007   LDLCALC 102* 01/10/2007   ALT 11 06/03/2011   AST 17 06/03/2011   NA 138 06/03/2011   K 4.2 06/03/2011   CL 105 06/03/2011   CREATININE 0.85 06/10/2011   BUN 10 06/03/2011   CO2 28 06/03/2011   TSH 1.41 11/19/2009   PSA 0.46 01/10/2007   INR 1.06 06/03/2011   HGBA1C 6.1* 01/10/2007     Procedure Note :     Procedure : Cryosurgery  Indication:  Actinic keratosis(es)   Risks including unsuccessful procedure , bleeding, infection, bruising, scar, a need for a repeat  procedure and others were explained to the patient in detail as well as the benefits. Informed consent was obtained verbally.    4 lesion(s)  on face and R forearm   was/were treated with liquid nitrogen on a Q-tip in a usual fasion . Band-Aid was applied and antibiotic ointment was given for a later use.   Tolerated well. Complications none.   Postprocedure  instructions :     Keep the wounds clean. You can wash them with liquid soap and water. Pat dry with gauze or a Kleenex tissue  Before applying antibiotic ointment and a Band-Aid.   You need to report immediately  if  any signs of infection develop.         Assessment & Plan:

## 2012-01-12 NOTE — Patient Instructions (Signed)
    Keep the wounds clean. You can wash them with liquid soap and water. Pat dry with gauze or a Kleenex tissue  before applying antibiotic   You need to report immediately  if  any signs of infection develop.

## 2012-01-12 NOTE — Assessment & Plan Note (Signed)
Smaller meals Creon prn Zantac prn

## 2012-01-12 NOTE — Assessment & Plan Note (Signed)
See procedure 

## 2012-02-14 DIAGNOSIS — H02429 Myogenic ptosis of unspecified eyelid: Secondary | ICD-10-CM | POA: Diagnosis not present

## 2012-03-20 ENCOUNTER — Other Ambulatory Visit: Payer: Self-pay | Admitting: Internal Medicine

## 2012-03-20 NOTE — Telephone Encounter (Signed)
Ok to Rf? 

## 2012-04-09 ENCOUNTER — Other Ambulatory Visit: Payer: Self-pay | Admitting: Internal Medicine

## 2012-04-09 NOTE — Telephone Encounter (Signed)
Ok to RF? 

## 2012-04-11 ENCOUNTER — Other Ambulatory Visit: Payer: Self-pay | Admitting: Internal Medicine

## 2012-05-16 ENCOUNTER — Ambulatory Visit: Payer: Medicare Other | Admitting: Internal Medicine

## 2012-05-16 DIAGNOSIS — M545 Low back pain, unspecified: Secondary | ICD-10-CM | POA: Diagnosis not present

## 2012-05-16 DIAGNOSIS — C61 Malignant neoplasm of prostate: Secondary | ICD-10-CM | POA: Diagnosis not present

## 2012-05-21 ENCOUNTER — Telehealth: Payer: Self-pay | Admitting: Internal Medicine

## 2012-05-21 ENCOUNTER — Ambulatory Visit (INDEPENDENT_AMBULATORY_CARE_PROVIDER_SITE_OTHER)
Admission: RE | Admit: 2012-05-21 | Discharge: 2012-05-21 | Disposition: A | Discharge status: Home / Self Care | Payer: Medicare Other | Source: Ambulatory Visit | Attending: Internal Medicine | Admitting: Internal Medicine

## 2012-05-21 ENCOUNTER — Ambulatory Visit (INDEPENDENT_AMBULATORY_CARE_PROVIDER_SITE_OTHER): Payer: Medicare Other | Admitting: Internal Medicine

## 2012-05-21 ENCOUNTER — Encounter: Payer: Self-pay | Admitting: Internal Medicine

## 2012-05-21 VITALS — BP 140/78 | HR 80 | Temp 97.1°F | Resp 16 | Wt 215.0 lb

## 2012-05-21 DIAGNOSIS — M545 Low back pain, unspecified: Secondary | ICD-10-CM | POA: Diagnosis not present

## 2012-05-21 DIAGNOSIS — I10 Essential (primary) hypertension: Secondary | ICD-10-CM

## 2012-05-21 DIAGNOSIS — K219 Gastro-esophageal reflux disease without esophagitis: Secondary | ICD-10-CM | POA: Diagnosis not present

## 2012-05-21 DIAGNOSIS — H02409 Unspecified ptosis of unspecified eyelid: Secondary | ICD-10-CM | POA: Diagnosis not present

## 2012-05-21 DIAGNOSIS — M519 Unspecified thoracic, thoracolumbar and lumbosacral intervertebral disc disorder: Secondary | ICD-10-CM | POA: Diagnosis not present

## 2012-05-21 DIAGNOSIS — F411 Generalized anxiety disorder: Secondary | ICD-10-CM

## 2012-05-21 DIAGNOSIS — E559 Vitamin D deficiency, unspecified: Secondary | ICD-10-CM

## 2012-05-21 NOTE — Assessment & Plan Note (Signed)
Continue with current prescription therapy as reflected on the Med list.  

## 2012-05-21 NOTE — Assessment & Plan Note (Signed)
2/14 s/p fall 2 mo ago xray

## 2012-05-21 NOTE — Assessment & Plan Note (Signed)
Ophth consult 

## 2012-05-21 NOTE — Telephone Encounter (Signed)
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

## 2012-05-21 NOTE — Progress Notes (Signed)
   Subjective:    Patient ID: Jorge [name], male    DOB: Apr 28, 1929, 79 y.o.   MRN: [id number]  HPI   C/o LBP after a fall 6-8 wks ago F/u HH, HTN, prostate ca F/u on insomnia - better F/u GERD and indigestion - better C/o visio problems due to droopy eyelids L>R  Wt Readings from Last 3 Encounters:  05/21/12 215 lb (97.523 kg)  01/12/12 209 lb (94.802 kg)  10/26/11 198 lb (89.812 kg)   BP Readings from Last 3 Encounters:  05/21/12 140/78  01/12/12 150/78  10/26/11 158/60       Review of Systems  Constitutional: Negative for fever, chills, diaphoresis, appetite change, fatigue and unexpected weight change.  HENT: Negative for nosebleeds, congestion, sore throat, sneezing, trouble swallowing and neck pain.   Eyes: Negative for itching and visual disturbance.  Respiratory: Negative for cough, choking, chest tightness and wheezing.   Cardiovascular: Negative for chest pain, palpitations and leg swelling.  Gastrointestinal: Negative for nausea, vomiting, abdominal pain, diarrhea, constipation, blood in stool, abdominal distention, anal bleeding and rectal pain.  Genitourinary: Negative for frequency and hematuria.  Musculoskeletal: Negative for back pain, joint swelling and gait problem.  Skin: Negative for rash.  Neurological: Negative for dizziness, tremors, speech difficulty and weakness.  Psychiatric/Behavioral: Negative for sleep disturbance, dysphoric mood and agitation. The patient is not nervous/anxious.        Objective:   Physical Exam  Constitutional: He is oriented to person, place, and time. He appears well-developed. No distress.  NAD Looks well  HENT:  Mouth/Throat: Oropharynx is clear and moist.  Eyes: Conjunctivae are normal. Pupils are equal, round, and reactive to light.  Neck: Normal range of motion. No JVD present. No thyromegaly present.  Cardiovascular: Normal rate, regular rhythm, normal heart sounds and intact distal pulses.  Exam reveals no  gallop and no friction rub.   No murmur heard. Pulmonary/Chest: Effort normal and breath sounds normal. No respiratory distress. He has no wheezes. He has no rales. He exhibits no tenderness.  Abdominal: Soft. Bowel sounds are normal. He exhibits no distension and no mass. There is no tenderness (LUQ is sensitive to palp). There is no rebound and no guarding.  Musculoskeletal: Normal range of motion. He exhibits no edema and no tenderness.  Lymphadenopathy:    He has no cervical adenopathy.  Neurological: He is alert and oriented to person, place, and time. He has normal reflexes. No cranial nerve deficit. He exhibits normal muscle tone. Coordination normal.  Skin: Skin is warm and dry. No rash noted. He is not diaphoretic.  Psychiatric: He has a normal mood and affect. His behavior is normal. Judgment and thought content normal.  AKs face, R arm Droopy eyelids B L>R  Lab Results  Component Value Date   WBC 10.1 06/12/2011   HGB 12.4* 06/12/2011   HCT 37.8* 06/12/2011   PLT 130* 06/12/2011   GLUCOSE 127* 06/03/2011   CHOL 167 01/10/2007   TRIG 89 01/10/2007   HDL 47.7 01/10/2007   LDLCALC 102* 01/10/2007   ALT 11 06/03/2011   AST 17 06/03/2011   NA 138 06/03/2011   K 4.2 06/03/2011   CL 105 06/03/2011   CREATININE 0.85 06/10/2011   BUN 10 06/03/2011   CO2 28 06/03/2011   TSH 1.41 11/19/2009   PSA 0.46 01/10/2007   INR 1.06 06/03/2011   HGBA1C 6.1* 01/10/2007           Assessment & Plan:

## 2012-05-22 NOTE — Telephone Encounter (Signed)
Pt informed

## 2012-05-31 DIAGNOSIS — H02839 Dermatochalasis of unspecified eye, unspecified eyelid: Secondary | ICD-10-CM | POA: Diagnosis not present

## 2012-05-31 DIAGNOSIS — H3560 Retinal hemorrhage, unspecified eye: Secondary | ICD-10-CM | POA: Diagnosis not present

## 2012-05-31 DIAGNOSIS — H02419 Mechanical ptosis of unspecified eyelid: Secondary | ICD-10-CM | POA: Diagnosis not present

## 2012-05-31 DIAGNOSIS — H43819 Vitreous degeneration, unspecified eye: Secondary | ICD-10-CM | POA: Diagnosis not present

## 2012-06-11 DIAGNOSIS — H02419 Mechanical ptosis of unspecified eyelid: Secondary | ICD-10-CM | POA: Diagnosis not present

## 2012-06-11 DIAGNOSIS — H02839 Dermatochalasis of unspecified eye, unspecified eyelid: Secondary | ICD-10-CM | POA: Diagnosis not present

## 2012-06-27 DIAGNOSIS — H02839 Dermatochalasis of unspecified eye, unspecified eyelid: Secondary | ICD-10-CM | POA: Diagnosis not present

## 2012-06-27 DIAGNOSIS — H02409 Unspecified ptosis of unspecified eyelid: Secondary | ICD-10-CM | POA: Diagnosis not present

## 2012-06-27 DIAGNOSIS — H02419 Mechanical ptosis of unspecified eyelid: Secondary | ICD-10-CM | POA: Diagnosis not present

## 2012-07-11 ENCOUNTER — Other Ambulatory Visit: Payer: Self-pay | Admitting: Internal Medicine

## 2012-08-23 ENCOUNTER — Encounter: Payer: Self-pay | Admitting: Internal Medicine

## 2012-08-23 ENCOUNTER — Ambulatory Visit (INDEPENDENT_AMBULATORY_CARE_PROVIDER_SITE_OTHER): Payer: Medicare Other | Admitting: Internal Medicine

## 2012-08-23 VITALS — BP 160/82 | HR 76 | Temp 97.9°F | Resp 16 | Wt 216.0 lb

## 2012-08-23 DIAGNOSIS — M25519 Pain in unspecified shoulder: Secondary | ICD-10-CM | POA: Insufficient documentation

## 2012-08-23 DIAGNOSIS — I10 Essential (primary) hypertension: Secondary | ICD-10-CM

## 2012-08-23 DIAGNOSIS — H02409 Unspecified ptosis of unspecified eyelid: Secondary | ICD-10-CM | POA: Diagnosis not present

## 2012-08-23 DIAGNOSIS — L309 Dermatitis, unspecified: Secondary | ICD-10-CM

## 2012-08-23 DIAGNOSIS — H02403 Unspecified ptosis of bilateral eyelids: Secondary | ICD-10-CM

## 2012-08-23 DIAGNOSIS — M25511 Pain in right shoulder: Secondary | ICD-10-CM

## 2012-08-23 DIAGNOSIS — L259 Unspecified contact dermatitis, unspecified cause: Secondary | ICD-10-CM | POA: Diagnosis not present

## 2012-08-23 MED ORDER — TRIAMCINOLONE ACETONIDE 0.5 % EX CREA: TOPICAL_CREAM | Freq: Three times a day (TID) | CUTANEOUS | Status: DC

## 2012-08-23 NOTE — Progress Notes (Signed)
Patient ID: Jorge [name], male   DOB: [birth year], 79 y.o.   MRN: [id number]   Subjective:     HPI  C/o a skin spot on R temple C/o R shoulder pain x 2 d  F/u LBP after a fall rsolved F/u HH, HTN, prostate ca F/u on insomnia - better F/u GERD and indigestion - better F/u vision problems due to droopy eyelids L>R - had surgery  Wt Readings from Last 3 Encounters:  08/23/12 216 lb (97.977 kg)  05/21/12 215 lb (97.523 kg)  01/12/12 209 lb (94.802 kg)   BP Readings from Last 3 Encounters:  08/23/12 160/82  05/21/12 140/78  01/12/12 150/78       Review of Systems  Constitutional: Negative for fever, chills, diaphoresis, appetite change, fatigue and unexpected weight change.  HENT: Negative for nosebleeds, congestion, sore throat, sneezing, trouble swallowing and neck pain.   Eyes: Negative for itching and visual disturbance.  Respiratory: Negative for cough, choking, chest tightness and wheezing.   Cardiovascular: Negative for chest pain, palpitations and leg swelling.  Gastrointestinal: Negative for nausea, vomiting, abdominal pain, diarrhea, constipation, blood in stool, abdominal distention, anal bleeding and rectal pain.  Genitourinary: Negative for frequency and hematuria.  Musculoskeletal: Negative for back pain, joint swelling and gait problem.  Skin: Negative for rash.  Neurological: Negative for dizziness, tremors, speech difficulty and weakness.  Psychiatric/Behavioral: Negative for sleep disturbance, dysphoric mood and agitation. The patient is not nervous/anxious.        Objective:   Physical Exam  Constitutional: He is oriented to person, place, and time. He appears well-developed. No distress.  NAD Looks well  HENT:  Mouth/Throat: Oropharynx is clear and moist.  Eyes: Conjunctivae are normal. Pupils are equal, round, and reactive to light.  Neck: Normal range of motion. No JVD present. No thyromegaly present.  Cardiovascular: Normal rate, regular  rhythm, normal heart sounds and intact distal pulses.  Exam reveals no gallop and no friction rub.   No murmur heard. Pulmonary/Chest: Effort normal and breath sounds normal. No respiratory distress. He has no wheezes. He has no rales. He exhibits no tenderness.  Abdominal: Soft. Bowel sounds are normal. He exhibits no distension and no mass. There is no tenderness (LUQ is sensitive to palp). There is no rebound and no guarding.  Musculoskeletal: Normal range of motion. He exhibits no edema and no tenderness.  Lymphadenopathy:    He has no cervical adenopathy.  Neurological: He is alert and oriented to person, place, and time. He has normal reflexes. No cranial nerve deficit. He exhibits normal muscle tone. Coordination normal.  Skin: Skin is warm and dry. No rash noted. He is not diaphoretic.  Psychiatric: He has a normal mood and affect. His behavior is normal. Judgment and thought content normal.  SKs face, R temple - large L thumb - a patch of rash   Lab Results  Component Value Date   WBC 10.1 06/12/2011   HGB 12.4* 06/12/2011   HCT 37.8* 06/12/2011   PLT 130* 06/12/2011   GLUCOSE 127* 06/03/2011   CHOL 167 01/10/2007   TRIG 89 01/10/2007   HDL 47.7 01/10/2007   LDLCALC 102* 01/10/2007   ALT 11 06/03/2011   AST 17 06/03/2011   NA 138 06/03/2011   K 4.2 06/03/2011   CL 105 06/03/2011   CREATININE 0.85 06/10/2011   BUN 10 06/03/2011   CO2 28 06/03/2011   TSH 1.41 11/19/2009   PSA 0.46 01/10/2007   INR 1.06 06/03/2011  HGBA1C 6.1* 01/10/2007           Assessment & Plan:

## 2012-08-23 NOTE — Assessment & Plan Note (Signed)
Continue with current prescription therapy as reflected on the Med list.  

## 2012-08-23 NOTE — Assessment & Plan Note (Signed)
Ice ROM exercise

## 2012-08-23 NOTE — Assessment & Plan Note (Signed)
Resolved post-op 

## 2012-08-23 NOTE — Assessment & Plan Note (Addendum)
Triamc cream Rx 

## 2012-09-06 DIAGNOSIS — H04129 Dry eye syndrome of unspecified lacrimal gland: Secondary | ICD-10-CM | POA: Diagnosis not present

## 2012-09-06 DIAGNOSIS — H3560 Retinal hemorrhage, unspecified eye: Secondary | ICD-10-CM | POA: Diagnosis not present

## 2012-09-06 DIAGNOSIS — H43819 Vitreous degeneration, unspecified eye: Secondary | ICD-10-CM | POA: Diagnosis not present

## 2012-09-06 DIAGNOSIS — Z961 Presence of intraocular lens: Secondary | ICD-10-CM | POA: Diagnosis not present

## 2012-11-07 ENCOUNTER — Encounter: Payer: Self-pay | Admitting: Gastroenterology

## 2012-11-23 DIAGNOSIS — N529 Male erectile dysfunction, unspecified: Secondary | ICD-10-CM | POA: Diagnosis not present

## 2012-11-23 DIAGNOSIS — C61 Malignant neoplasm of prostate: Secondary | ICD-10-CM | POA: Diagnosis not present

## 2012-12-06 ENCOUNTER — Encounter: Payer: Self-pay | Admitting: Internal Medicine

## 2012-12-06 ENCOUNTER — Ambulatory Visit (INDEPENDENT_AMBULATORY_CARE_PROVIDER_SITE_OTHER): Payer: Medicare Other | Admitting: Internal Medicine

## 2012-12-06 VITALS — BP 140/90 | HR 72 | Temp 97.3°F | Resp 16 | Wt 218.0 lb

## 2012-12-06 DIAGNOSIS — Z23 Encounter for immunization: Secondary | ICD-10-CM | POA: Diagnosis not present

## 2012-12-06 DIAGNOSIS — I10 Essential (primary) hypertension: Secondary | ICD-10-CM | POA: Diagnosis not present

## 2012-12-06 MED ORDER — LOSARTAN POTASSIUM 50 MG PO TABS: 50.0000 mg | ORAL_TABLET | Freq: Every day | ORAL | Status: DC

## 2012-12-06 MED ORDER — VITAMIN D 1000 UNITS PO TABS: 1000.0000 [IU] | ORAL_TABLET | Freq: Every day | ORAL | Status: AC

## 2012-12-06 NOTE — Progress Notes (Signed)
Subjective:    HPI   C/o LBP after a yoga session R F/u HH, HTN, prostate ca F/u on insomnia - better F/u GERD and indigestion - better   Wt Readings from Last 3 Encounters:  12/06/12 218 lb (98.884 kg)  08/23/12 216 lb (97.977 kg)  05/21/12 215 lb (97.523 kg)   BP Readings from Last 3 Encounters:  12/06/12 140/90  08/23/12 160/82  05/21/12 140/78       Review of Systems  Constitutional: Negative for fever, chills, diaphoresis, appetite change, fatigue and unexpected weight change.  HENT: Negative for nosebleeds, congestion, sore throat, sneezing, trouble swallowing and neck pain.   Eyes: Negative for itching and visual disturbance.  Respiratory: Negative for cough, choking, chest tightness and wheezing.   Cardiovascular: Negative for chest pain, palpitations and leg swelling.  Gastrointestinal: Negative for nausea, vomiting, abdominal pain, diarrhea, constipation, blood in stool, abdominal distention, anal bleeding and rectal pain.  Genitourinary: Negative for frequency and hematuria.  Musculoskeletal: Negative for back pain, joint swelling and gait problem.  Skin: Negative for rash.  Neurological: Negative for dizziness, tremors, speech difficulty and weakness.  Psychiatric/Behavioral: Negative for sleep disturbance, dysphoric mood and agitation. The patient is not nervous/anxious.        Objective:   Physical Exam  Constitutional: He is oriented to person, place, and time. He appears well-developed. No distress.  NAD Looks well  HENT:  Mouth/Throat: Oropharynx is clear and moist.  Eyes: Conjunctivae are normal. Pupils are equal, round, and reactive to light.  Neck: Normal range of motion. No JVD present. No thyromegaly present.  Cardiovascular: Normal rate, regular rhythm, normal heart sounds and intact distal pulses.  Exam reveals no gallop and no friction rub.   No murmur heard. Pulmonary/Chest: Effort normal and breath sounds normal. No respiratory  distress. He has no wheezes. He has no rales. He exhibits no tenderness.  Abdominal: Soft. Bowel sounds are normal. He exhibits no distension and no mass. There is no tenderness (LUQ is sensitive to palp). There is no rebound and no guarding.  Musculoskeletal: Normal range of motion. He exhibits no edema and no tenderness.  Lymphadenopathy:    He has no cervical adenopathy.  Neurological: He is alert and oriented to person, place, and time. He has normal reflexes. No cranial nerve deficit. He exhibits normal muscle tone. Coordination normal.  Skin: Skin is warm and dry. No rash noted. He is not diaphoretic.  Psychiatric: He has a normal mood and affect. His behavior is normal. Judgment and thought content normal.  AKs face, R arm Droopy eyelids B L>R  Lab Results  Component Value Date   WBC 10.1 06/12/2011   HGB 12.4* 06/12/2011   HCT 37.8* 06/12/2011   PLT 130* 06/12/2011   GLUCOSE 127* 06/03/2011   CHOL 167 01/10/2007   TRIG 89 01/10/2007   HDL 47.7 01/10/2007   LDLCALC 102* 01/10/2007   ALT 11 06/03/2011   AST 17 06/03/2011   NA 138 06/03/2011   K 4.2 06/03/2011   CL 105 06/03/2011   CREATININE 0.85 06/10/2011   BUN 10 06/03/2011   CO2 28 06/03/2011   TSH 1.41 11/19/2009   PSA 0.46 01/10/2007   INR 1.06 06/03/2011   HGBA1C 6.1* 01/10/2007    Procedure Note :     Procedure : Cryosurgery   Indication:  Wart(s)  Actinic keratosis(es)   Risks including unsuccessful procedure , bleeding, infection, bruising, scar, a need for a repeat  procedure and others were explained  to the patient in detail as well as the benefits. Informed consent was obtained verbally.     lesion(s)  on    was/were treated with liquid nitrogen on a Q-tip in a usual fasion . Band-Aid was applied and antibiotic ointment was given for a later use.   Tolerated well. Complications none.   Postprocedure instructions :     Keep the wounds clean. You can wash them with liquid soap and water. Pat dry with gauze or a Kleenex tissue   Before applying antibiotic ointment and a Band-Aid.   You need to report immediately  if  any signs of infection develop.           Assessment & Plan:

## 2012-12-06 NOTE — Assessment & Plan Note (Addendum)
Mild  NAS diet Start Losartan

## 2012-12-06 NOTE — Patient Instructions (Signed)
   Postprocedure instructions :     Keep the wounds clean. You can wash them with liquid soap and water. Pat dry with gauze or a Kleenex tissue  Before applying antibiotic ointment and a Band-Aid.   You need to report immediately  if  any signs of infection develop.    

## 2013-03-15 DIAGNOSIS — H3560 Retinal hemorrhage, unspecified eye: Secondary | ICD-10-CM | POA: Diagnosis not present

## 2013-03-15 DIAGNOSIS — H04129 Dry eye syndrome of unspecified lacrimal gland: Secondary | ICD-10-CM | POA: Diagnosis not present

## 2013-03-15 DIAGNOSIS — H43819 Vitreous degeneration, unspecified eye: Secondary | ICD-10-CM | POA: Diagnosis not present

## 2013-03-15 DIAGNOSIS — Z961 Presence of intraocular lens: Secondary | ICD-10-CM | POA: Diagnosis not present

## 2013-04-15 ENCOUNTER — Other Ambulatory Visit: Payer: Self-pay | Admitting: Internal Medicine

## 2013-04-15 NOTE — Telephone Encounter (Signed)
Refill done.  

## 2013-04-24 ENCOUNTER — Encounter: Payer: Self-pay | Admitting: Internal Medicine

## 2013-04-24 ENCOUNTER — Ambulatory Visit (INDEPENDENT_AMBULATORY_CARE_PROVIDER_SITE_OTHER): Payer: Medicare Other | Admitting: Internal Medicine

## 2013-04-24 VITALS — BP 140/80 | HR 76 | Temp 98.2°F | Resp 16 | Wt 217.0 lb

## 2013-04-24 DIAGNOSIS — M545 Low back pain, unspecified: Secondary | ICD-10-CM | POA: Diagnosis not present

## 2013-04-24 DIAGNOSIS — K219 Gastro-esophageal reflux disease without esophagitis: Secondary | ICD-10-CM | POA: Diagnosis not present

## 2013-04-24 DIAGNOSIS — G4700 Insomnia, unspecified: Secondary | ICD-10-CM | POA: Diagnosis not present

## 2013-04-24 DIAGNOSIS — I10 Essential (primary) hypertension: Secondary | ICD-10-CM

## 2013-04-24 MED ORDER — TERAZOSIN HCL 2 MG PO CAPS: ORAL_CAPSULE | ORAL | Status: DC

## 2013-04-24 MED ORDER — TRIAZOLAM 0.25 MG PO TABS: 0.2500 mg | ORAL_TABLET | Freq: Every evening | ORAL | Status: DC | PRN

## 2013-04-24 NOTE — Assessment & Plan Note (Signed)
Continue with current prn prescription therapy as reflected on the Med list.  

## 2013-04-24 NOTE — Assessment & Plan Note (Signed)
Continue with current prescription therapy as reflected on the Med list.  

## 2013-04-24 NOTE — Progress Notes (Signed)
Pre visit review using our clinic review tool, if applicable. No additional management support is needed unless otherwise documented below in the visit note. 

## 2013-04-24 NOTE — Progress Notes (Signed)
   Subjective:    HPI  Wife moved out F/u LBP F/u HH, HTN, prostate ca F/u on insomnia - worse F/u GERD and indigestion - better   Wt Readings from Last 3 Encounters:  04/24/13 217 lb (98.431 kg)  12/06/12 218 lb (98.884 kg)  08/23/12 216 lb (97.977 kg)   BP Readings from Last 3 Encounters:  04/24/13 160/88  12/06/12 140/90  08/23/12 160/82       Review of Systems  Constitutional: Negative for fever, chills, diaphoresis, appetite change, fatigue and unexpected weight change.  HENT: Negative for congestion, nosebleeds, sneezing, sore throat and trouble swallowing.   Eyes: Negative for itching and visual disturbance.  Respiratory: Negative for cough, choking, chest tightness and wheezing.   Cardiovascular: Negative for chest pain, palpitations and leg swelling.  Gastrointestinal: Negative for nausea, vomiting, abdominal pain, diarrhea, constipation, blood in stool, abdominal distention, anal bleeding and rectal pain.  Genitourinary: Negative for frequency and hematuria.  Musculoskeletal: Negative for back pain, gait problem, joint swelling and neck pain.  Skin: Negative for rash.  Neurological: Negative for dizziness, tremors, speech difficulty and weakness.  Psychiatric/Behavioral: Negative for sleep disturbance, dysphoric mood and agitation. The patient is not nervous/anxious.        Objective:   Physical Exam  Constitutional: He is oriented to person, place, and time. He appears well-developed. No distress.  NAD Looks well  HENT:  Mouth/Throat: Oropharynx is clear and moist.  Eyes: Conjunctivae are normal. Pupils are equal, round, and reactive to light.  Neck: Normal range of motion. No JVD present. No thyromegaly present.  Cardiovascular: Normal rate, regular rhythm, normal heart sounds and intact distal pulses.  Exam reveals no gallop and no friction rub.   No murmur heard. Pulmonary/Chest: Effort normal and breath sounds normal. No respiratory distress. He has  no wheezes. He has no rales. He exhibits no tenderness.  Abdominal: Soft. Bowel sounds are normal. He exhibits no distension and no mass. There is no tenderness (LUQ is sensitive to palp). There is no rebound and no guarding.  Musculoskeletal: Normal range of motion. He exhibits no edema and no tenderness.  Lymphadenopathy:    He has no cervical adenopathy.  Neurological: He is alert and oriented to person, place, and time. He has normal reflexes. No cranial nerve deficit. He exhibits normal muscle tone. Coordination normal.  Skin: Skin is warm and dry. No rash noted. He is not diaphoretic.  Psychiatric: He has a normal mood and affect. His behavior is normal. Judgment and thought content normal.    Lab Results  Component Value Date   WBC 10.1 06/12/2011   HGB 12.4* 06/12/2011   HCT 37.8* 06/12/2011   PLT 130* 06/12/2011   GLUCOSE 127* 06/03/2011   CHOL 167 01/10/2007   TRIG 89 01/10/2007   HDL 47.7 01/10/2007   LDLCALC 102* 01/10/2007   ALT 11 06/03/2011   AST 17 06/03/2011   NA 138 06/03/2011   K 4.2 06/03/2011   CL 105 06/03/2011   CREATININE 0.85 06/10/2011   BUN 10 06/03/2011   CO2 28 06/03/2011   TSH 1.41 11/19/2009   PSA 0.46 01/10/2007   INR 1.06 06/03/2011   HGBA1C 6.1* 01/10/2007         Assessment & Plan:

## 2013-04-24 NOTE — Assessment & Plan Note (Signed)
Hacion prn

## 2013-05-20 ENCOUNTER — Encounter: Payer: Self-pay | Admitting: Internal Medicine

## 2013-05-20 ENCOUNTER — Ambulatory Visit (INDEPENDENT_AMBULATORY_CARE_PROVIDER_SITE_OTHER): Payer: Medicare Other | Admitting: Internal Medicine

## 2013-05-20 VITALS — BP 168/86 | HR 80 | Temp 98.2°F | Resp 16 | Wt 217.0 lb

## 2013-05-20 DIAGNOSIS — D485 Neoplasm of uncertain behavior of skin: Secondary | ICD-10-CM

## 2013-05-20 DIAGNOSIS — L820 Inflamed seborrheic keratosis: Secondary | ICD-10-CM | POA: Diagnosis not present

## 2013-05-20 NOTE — Patient Instructions (Signed)
Postprocedure instructions :    A Band-Aid should be  changed twice daily. You can take a shower tomorrow.  Keep the wounds clean. You can wash them with liquid soap and water. Pat dry with gauze or a Kleenex tissue  Before applying antibiotic ointment and a Band-Aid.   You need to report immediately  if fever, chills or any signs of infection develop.    The biopsy results should be available in 1 -2 weeks. 

## 2013-05-20 NOTE — Progress Notes (Signed)
    Procedure Note :     Procedure :  Skin biopsy   Indication:  Changing mole (s ),  Suspicious lesion(s)   Risks including unsuccessful procedure , bleeding, infection, bruising, scar, a need for another complete procedure and others were explained to the patient in detail as well as the benefits. Informed consent was obtained and signed.   The patient was placed in a decubitus position.  Lesion #1 on R temple  measuring 31x25  mm   Skin over lesion #1  was prepped with Betadine and alcohol  and anesthetized with 2.5 cc of 2% lidocaine and epinephrine, using a 25-gauge 1 inch needle.  Shave biopsy with a sterile Dermablade was carried out in the usual fashion - partial removal. Hyfrecator was used to destroy the rest of the lesion potentially left behind and for hemostasis. Telfa/coban wrap was applied with antibiotic ointment.    Lesion #2 on  L neck   measuring 11x6  mm   Skin over lesion #2  was prepped with Betadine and alcohol  and anesthetized with 1/2 cc of 2% lidocaine and epinephrine, using a 25-gauge 1 inch needle.  Shave biopsy with a sterile Dermablade was carried out in the usual fashion. Hyfrecator was used to destroy the rest of the lesion potentially left behind and for hemostasis. Band-Aid was applied with antibiotic ointment.  Lesion #3 on  L neck  measuring 7x4  mm   Skin over lesion #3  was prepped with Betadine and alcohol  and anesthetized with 1/2 cc of 2% lidocaine and epinephrine, using a 25-gauge 1 inch needle.  Shave biopsy with a sterile Dermablade was carried out in the usual fashion. Hyfrecator was used to destroy the rest of the lesion potentially left behind and for hemostasis. Band-Aid was applied with antibiotic ointment. #3 was discarded.   Tolerated well. Complications none.

## 2013-05-20 NOTE — Progress Notes (Signed)
Pre visit review using our clinic review tool, if applicable. No additional management support is needed unless otherwise documented below in the visit note. 

## 2013-05-20 NOTE — Assessment & Plan Note (Signed)
2/15 R temple, L neck x3 See procedure

## 2013-05-21 ENCOUNTER — Encounter: Payer: Self-pay | Admitting: Internal Medicine

## 2013-06-05 DIAGNOSIS — N139 Obstructive and reflux uropathy, unspecified: Secondary | ICD-10-CM | POA: Diagnosis not present

## 2013-06-05 DIAGNOSIS — N138 Other obstructive and reflux uropathy: Secondary | ICD-10-CM | POA: Diagnosis not present

## 2013-06-05 DIAGNOSIS — C61 Malignant neoplasm of prostate: Secondary | ICD-10-CM | POA: Diagnosis not present

## 2013-06-05 DIAGNOSIS — N401 Enlarged prostate with lower urinary tract symptoms: Secondary | ICD-10-CM | POA: Diagnosis not present

## 2013-06-24 ENCOUNTER — Encounter: Payer: Self-pay | Admitting: Gastroenterology

## 2013-07-23 ENCOUNTER — Encounter: Payer: Self-pay | Admitting: Internal Medicine

## 2013-07-23 ENCOUNTER — Inpatient Hospital Stay
Admission: RE | Admit: 2013-07-23 | Discharge: 2013-07-23 | Disposition: A | Discharge status: Home / Self Care | Payer: Self-pay | Source: Ambulatory Visit | Attending: Internal Medicine | Admitting: Internal Medicine

## 2013-07-23 ENCOUNTER — Ambulatory Visit (INDEPENDENT_AMBULATORY_CARE_PROVIDER_SITE_OTHER): Payer: Medicare Other | Admitting: Internal Medicine

## 2013-07-23 VITALS — BP 130/82 | HR 58 | Temp 97.3°F | Wt 218.0 lb

## 2013-07-23 DIAGNOSIS — I10 Essential (primary) hypertension: Secondary | ICD-10-CM

## 2013-07-23 DIAGNOSIS — M25551 Pain in right hip: Secondary | ICD-10-CM

## 2013-07-23 DIAGNOSIS — F411 Generalized anxiety disorder: Secondary | ICD-10-CM

## 2013-07-23 DIAGNOSIS — M25559 Pain in unspecified hip: Secondary | ICD-10-CM | POA: Diagnosis not present

## 2013-07-23 DIAGNOSIS — H6120 Impacted cerumen, unspecified ear: Secondary | ICD-10-CM | POA: Diagnosis not present

## 2013-07-23 MED ORDER — CLONAZEPAM 1 MG PO TABS: 0.5000 mg | ORAL_TABLET | Freq: Two times a day (BID) | ORAL | Status: DC | PRN

## 2013-07-23 NOTE — Assessment & Plan Note (Signed)
Will irrigate 

## 2013-07-23 NOTE — Assessment & Plan Note (Signed)
Continue with current prescription therapy as reflected on the Med list.  

## 2013-07-23 NOTE — Assessment & Plan Note (Signed)
Stretch X ray Move wallet to the front pocket

## 2013-07-23 NOTE — Progress Notes (Signed)
Pre visit review using our clinic review tool, if applicable. No additional management support is needed unless otherwise documented below in the visit note. 

## 2013-07-23 NOTE — Progress Notes (Signed)
Subjective:    HPI  Wife moved out in Nov 2014 - separated F/u LBP - better  C/o R hip pai w/walking F/u HH, HTN, prostate ca F/u on insomnia - worse F/u GERD and indigestion - better   Wt Readings from Last 3 Encounters:  07/23/13 218 lb (98.884 kg)  05/20/13 217 lb (98.431 kg)  04/24/13 217 lb (98.431 kg)   BP Readings from Last 3 Encounters:  07/23/13 130/82  05/20/13 168/86  04/24/13 140/80       Review of Systems  Constitutional: Negative for fever, chills, diaphoresis, appetite change, fatigue and unexpected weight change.  HENT: Negative for congestion, nosebleeds, sneezing, sore throat and trouble swallowing.   Eyes: Negative for itching and visual disturbance.  Respiratory: Negative for cough, choking, chest tightness and wheezing.   Cardiovascular: Negative for chest pain, palpitations and leg swelling.  Gastrointestinal: Negative for nausea, vomiting, abdominal pain, diarrhea, constipation, blood in stool, abdominal distention, anal bleeding and rectal pain.  Genitourinary: Negative for frequency and hematuria.  Musculoskeletal: Negative for back pain, gait problem, joint swelling and neck pain.  Skin: Negative for rash.  Neurological: Negative for dizziness, tremors, speech difficulty and weakness.  Psychiatric/Behavioral: Negative for sleep disturbance, dysphoric mood and agitation. The patient is not nervous/anxious.        Objective:   Physical Exam  Constitutional: He is oriented to person, place, and time. He appears well-developed. No distress.  NAD Looks well  HENT:  Mouth/Throat: Oropharynx is clear and moist.  Eyes: Conjunctivae are normal. Pupils are equal, round, and reactive to light.  Neck: Normal range of motion. No JVD present. No thyromegaly present.  Cardiovascular: Normal rate, regular rhythm, normal heart sounds and intact distal pulses.  Exam reveals no gallop and no friction rub.   No murmur heard. Pulmonary/Chest: Effort  normal and breath sounds normal. No respiratory distress. He has no wheezes. He has no rales. He exhibits no tenderness.  Abdominal: Soft. Bowel sounds are normal. He exhibits no distension and no mass. There is no tenderness (LUQ is sensitive to palp). There is no rebound and no guarding.  Musculoskeletal: Normal range of motion. He exhibits no edema and no tenderness.  Lymphadenopathy:    He has no cervical adenopathy.  Neurological: He is alert and oriented to person, place, and time. He has normal reflexes. No cranial nerve deficit. He exhibits normal muscle tone. Coordination normal.  Skin: Skin is warm and dry. No rash noted. He is not diaphoretic.  Psychiatric: He has a normal mood and affect. His behavior is normal. Judgment and thought content normal.  B wax R hip is tender posteriorly w/ROM  Lab Results  Component Value Date   WBC 10.1 06/12/2011   HGB 12.4* 06/12/2011   HCT 37.8* 06/12/2011   PLT 130* 06/12/2011   GLUCOSE 127* 06/03/2011   CHOL 167 01/10/2007   TRIG 89 01/10/2007   HDL 47.7 01/10/2007   LDLCALC 102* 01/10/2007   ALT 11 06/03/2011   AST 17 06/03/2011   NA 138 06/03/2011   K 4.2 06/03/2011   CL 105 06/03/2011   CREATININE 0.85 06/10/2011   BUN 10 06/03/2011   CO2 28 06/03/2011   TSH 1.41 11/19/2009   PSA 0.46 01/10/2007   INR 1.06 06/03/2011   HGBA1C 6.1* 01/10/2007      Procedure Note :     Procedure :  Ear irrigation   Indication:  Cerumen impaction   Risks, including pain, dizziness, eardrum perforation, bleeding, infection  and others as well as benefits were explained to the patient in detail. Verbal consent was obtained and the patient agreed to proceed.    We used "The Elephant Ear Irrigation Device" filled with lukewarm water for irrigation. A large amount wax was recovered. Procedure has also required manual wax removal with an ear loop.   Tolerated well. Complications: None.   Postprocedure instructions :  Call if problems.      Assessment & Plan:

## 2013-07-23 NOTE — Assessment & Plan Note (Signed)
Clonazepam prn 

## 2013-07-24 ENCOUNTER — Telehealth: Payer: Self-pay | Admitting: Internal Medicine

## 2013-07-24 NOTE — Telephone Encounter (Signed)
Relevant patient education assigned to patient using Emmi. ° °

## 2013-08-05 DIAGNOSIS — L02219 Cutaneous abscess of trunk, unspecified: Secondary | ICD-10-CM | POA: Diagnosis not present

## 2013-09-13 DIAGNOSIS — H43819 Vitreous degeneration, unspecified eye: Secondary | ICD-10-CM | POA: Diagnosis not present

## 2013-09-13 DIAGNOSIS — H3560 Retinal hemorrhage, unspecified eye: Secondary | ICD-10-CM | POA: Diagnosis not present

## 2013-09-13 DIAGNOSIS — H04129 Dry eye syndrome of unspecified lacrimal gland: Secondary | ICD-10-CM | POA: Diagnosis not present

## 2013-09-13 DIAGNOSIS — H35059 Retinal neovascularization, unspecified, unspecified eye: Secondary | ICD-10-CM | POA: Diagnosis not present

## 2013-10-02 DIAGNOSIS — H1130 Conjunctival hemorrhage, unspecified eye: Secondary | ICD-10-CM | POA: Diagnosis not present

## 2013-10-22 ENCOUNTER — Ambulatory Visit (INDEPENDENT_AMBULATORY_CARE_PROVIDER_SITE_OTHER): Payer: Medicare Other | Admitting: Internal Medicine

## 2013-10-22 ENCOUNTER — Encounter: Payer: Self-pay | Admitting: Internal Medicine

## 2013-10-22 VITALS — BP 120/72 | HR 72 | Temp 97.9°F | Ht 73.5 in | Wt 219.4 lb

## 2013-10-22 DIAGNOSIS — I10 Essential (primary) hypertension: Secondary | ICD-10-CM | POA: Diagnosis not present

## 2013-10-22 DIAGNOSIS — M25559 Pain in unspecified hip: Secondary | ICD-10-CM | POA: Diagnosis not present

## 2013-10-22 DIAGNOSIS — M545 Low back pain, unspecified: Secondary | ICD-10-CM

## 2013-10-22 DIAGNOSIS — L570 Actinic keratosis: Secondary | ICD-10-CM

## 2013-10-22 DIAGNOSIS — M25551 Pain in right hip: Secondary | ICD-10-CM

## 2013-10-22 NOTE — Assessment & Plan Note (Signed)
Continue with current prescription therapy as reflected on the Med list.  

## 2013-10-22 NOTE — Progress Notes (Signed)
Pre visit review using our clinic review tool, if applicable. No additional management support is needed unless otherwise documented below in the visit note. 

## 2013-10-22 NOTE — Progress Notes (Signed)
Subjective:    HPI  Wife moved out in Nov 2014 - separated F/u LBP - better  F/u R hip pai w/walking F/u HH, HTN, prostate ca F/u on insomnia - worse F/u GERD and indigestion - better C/o skin lesions   Wt Readings from Last 3 Encounters:  10/22/13 219 lb 6.4 oz (99.519 kg)  07/23/13 218 lb (98.884 kg)  05/20/13 217 lb (98.431 kg)   BP Readings from Last 3 Encounters:  10/22/13 120/72  07/23/13 130/82  05/20/13 168/86       Review of Systems  Constitutional: Negative for fever, chills, diaphoresis, appetite change, fatigue and unexpected weight change.  HENT: Negative for congestion, nosebleeds, sneezing, sore throat and trouble swallowing.   Eyes: Negative for itching and visual disturbance.  Respiratory: Negative for cough, choking, chest tightness and wheezing.   Cardiovascular: Negative for chest pain, palpitations and leg swelling.  Gastrointestinal: Negative for nausea, vomiting, abdominal pain, diarrhea, constipation, blood in stool, abdominal distention, anal bleeding and rectal pain.  Genitourinary: Negative for frequency and hematuria.  Musculoskeletal: Negative for back pain, gait problem, joint swelling and neck pain.  Skin: Negative for rash.  Neurological: Negative for dizziness, tremors, speech difficulty and weakness.  Psychiatric/Behavioral: Negative for sleep disturbance, dysphoric mood and agitation. The patient is not nervous/anxious.        Objective:   Physical Exam  Constitutional: He is oriented to person, place, and time. He appears well-developed. No distress.  NAD Looks well  HENT:  Mouth/Throat: Oropharynx is clear and moist.  Eyes: Conjunctivae are normal. Pupils are equal, round, and reactive to light.  Neck: Normal range of motion. No JVD present. No thyromegaly present.  Cardiovascular: Normal rate, regular rhythm, normal heart sounds and intact distal pulses.  Exam reveals no gallop and no friction rub.   No murmur  heard. Pulmonary/Chest: Effort normal and breath sounds normal. No respiratory distress. He has no wheezes. He has no rales. He exhibits no tenderness.  Abdominal: Soft. Bowel sounds are normal. He exhibits no distension and no mass. There is no tenderness (LUQ is sensitive to palp). There is no rebound and no guarding.  Musculoskeletal: Normal range of motion. He exhibits no edema and no tenderness.  Lymphadenopathy:    He has no cervical adenopathy.  Neurological: He is alert and oriented to person, place, and time. He has normal reflexes. No cranial nerve deficit. He exhibits normal muscle tone. Coordination normal.  Skin: Skin is warm and dry. No rash noted. He is not diaphoretic.  Psychiatric: He has a normal mood and affect. His behavior is normal. Judgment and thought content normal.  AKs R hip is tender posteriorly w/ROM  Lab Results  Component Value Date   WBC 10.1 06/12/2011   HGB 12.4* 06/12/2011   HCT 37.8* 06/12/2011   PLT 130* 06/12/2011   GLUCOSE 127* 06/03/2011   CHOL 167 01/10/2007   TRIG 89 01/10/2007   HDL 47.7 01/10/2007   LDLCALC 102* 01/10/2007   ALT 11 06/03/2011   AST 17 06/03/2011   NA 138 06/03/2011   K 4.2 06/03/2011   CL 105 06/03/2011   CREATININE 0.85 06/10/2011   BUN 10 06/03/2011   CO2 28 06/03/2011   TSH 1.41 11/19/2009   PSA 0.46 01/10/2007   INR 1.06 06/03/2011   HGBA1C 6.1* 01/10/2007     Procedure Note :     Procedure : Cryosurgery   Indication:  Wart(s)  Actinic keratosis(es)   Risks including unsuccessful procedure ,  bleeding, infection, bruising, scar, a need for a repeat  procedure and others were explained to the patient in detail as well as the benefits. Informed consent was obtained verbally.   16  lesion(s)  on face and B forearms   was/were treated with liquid nitrogen on a Q-tip in a usual fasion . Band-Aid was applied and antibiotic ointment was given for a later use.   Tolerated well. Complications none.   Postprocedure instructions :     Keep the  wounds clean. You can wash them with liquid soap and water. Pat dry with gauze or a Kleenex tissue  Before applying antibiotic ointment and a Band-Aid.   You need to report immediately  if  any signs of infection develop.        Assessment & Plan:

## 2013-10-22 NOTE — Assessment & Plan Note (Signed)
See procedure 

## 2013-10-22 NOTE — Assessment & Plan Note (Signed)
resolved 

## 2013-10-22 NOTE — Assessment & Plan Note (Signed)
Bursitis - chronic sx's PT offered

## 2013-10-22 NOTE — Patient Instructions (Signed)
   Postprocedure instructions :     Keep the wounds clean. You can wash them with liquid soap and water. Pat dry with gauze or a Kleenex tissue  Before applying antibiotic ointment and a Band-Aid.   You need to report immediately  if  any signs of infection develop.    

## 2013-12-11 DIAGNOSIS — C61 Malignant neoplasm of prostate: Secondary | ICD-10-CM | POA: Diagnosis not present

## 2013-12-11 DIAGNOSIS — Z8546 Personal history of malignant neoplasm of prostate: Secondary | ICD-10-CM | POA: Diagnosis not present

## 2013-12-11 DIAGNOSIS — N529 Male erectile dysfunction, unspecified: Secondary | ICD-10-CM | POA: Diagnosis not present

## 2014-01-09 ENCOUNTER — Encounter: Payer: Self-pay | Admitting: Internal Medicine

## 2014-01-09 ENCOUNTER — Ambulatory Visit (INDEPENDENT_AMBULATORY_CARE_PROVIDER_SITE_OTHER): Payer: Medicare Other | Admitting: Internal Medicine

## 2014-01-09 VITALS — BP 162/82 | HR 80 | Temp 98.2°F | Resp 16 | Wt 219.0 lb

## 2014-01-09 DIAGNOSIS — Z23 Encounter for immunization: Secondary | ICD-10-CM

## 2014-01-09 DIAGNOSIS — M545 Low back pain, unspecified: Secondary | ICD-10-CM

## 2014-01-09 DIAGNOSIS — K440 Diaphragmatic hernia with obstruction, without gangrene: Secondary | ICD-10-CM

## 2014-01-09 DIAGNOSIS — Z8546 Personal history of malignant neoplasm of prostate: Secondary | ICD-10-CM

## 2014-01-09 DIAGNOSIS — R634 Abnormal weight loss: Secondary | ICD-10-CM

## 2014-01-09 DIAGNOSIS — I10 Essential (primary) hypertension: Secondary | ICD-10-CM

## 2014-01-09 DIAGNOSIS — F411 Generalized anxiety disorder: Secondary | ICD-10-CM

## 2014-01-09 MED ORDER — TERAZOSIN HCL 2 MG PO CAPS: ORAL_CAPSULE | ORAL | Status: DC

## 2014-01-09 NOTE — Progress Notes (Signed)
Pre visit review using our clinic review tool, if applicable. No additional management support is needed unless otherwise documented below in the visit note. 

## 2014-01-09 NOTE — Assessment & Plan Note (Addendum)
Wife moved out in Nov 2014 - separated Doing well

## 2014-01-09 NOTE — Assessment & Plan Note (Signed)
BP ok at home 

## 2014-01-09 NOTE — Assessment & Plan Note (Signed)
Wt Readings from Last 3 Encounters:  01/09/14 219 lb (99.338 kg)  10/22/13 219 lb 6.4 oz (99.519 kg)  07/23/13 218 lb (98.884 kg)

## 2014-01-09 NOTE — Assessment & Plan Note (Signed)
Dr Diona Fanti 2008 Elev PSA 10/15 - US/bx pending

## 2014-01-09 NOTE — Assessment & Plan Note (Signed)
Resolved post-op 

## 2014-01-09 NOTE — Progress Notes (Signed)
   Subjective:    HPI   F/u LBP - better  F/u R hip pai w/walking F/u HH, HTN, prostate ca F/u on insomnia - worse F/u GERD and indigestion - better    Wt Readings from Last 3 Encounters:  01/09/14 219 lb (99.338 kg)  10/22/13 219 lb 6.4 oz (99.519 kg)  07/23/13 218 lb (98.884 kg)   BP Readings from Last 3 Encounters:  01/09/14 162/82  10/22/13 120/72  07/23/13 130/82       Review of Systems  Constitutional: Negative for fever, chills, diaphoresis, appetite change, fatigue and unexpected weight change.  HENT: Negative for congestion, nosebleeds, sneezing, sore throat and trouble swallowing.   Eyes: Negative for itching and visual disturbance.  Respiratory: Negative for cough, choking, chest tightness and wheezing.   Cardiovascular: Negative for chest pain, palpitations and leg swelling.  Gastrointestinal: Negative for nausea, vomiting, abdominal pain, diarrhea, constipation, blood in stool, abdominal distention, anal bleeding and rectal pain.  Genitourinary: Negative for frequency and hematuria.  Musculoskeletal: Negative for back pain, gait problem, joint swelling and neck pain.  Skin: Negative for rash.  Neurological: Negative for dizziness, tremors, speech difficulty and weakness.  Psychiatric/Behavioral: Negative for sleep disturbance, dysphoric mood and agitation. The patient is not nervous/anxious.        Objective:   Physical Exam  Constitutional: He is oriented to person, place, and time. He appears well-developed. No distress.  NAD Looks well  HENT:  Mouth/Throat: Oropharynx is clear and moist.  Eyes: Conjunctivae are normal. Pupils are equal, round, and reactive to light.  Neck: Normal range of motion. No JVD present. No thyromegaly present.  Cardiovascular: Normal rate, regular rhythm, normal heart sounds and intact distal pulses.  Exam reveals no gallop and no friction rub.   No murmur heard. Pulmonary/Chest: Effort normal and breath sounds normal.  No respiratory distress. He has no wheezes. He has no rales. He exhibits no tenderness.  Abdominal: Soft. Bowel sounds are normal. He exhibits no distension and no mass. There is no tenderness (LUQ is sensitive to palp). There is no rebound and no guarding.  Musculoskeletal: Normal range of motion. He exhibits no edema and no tenderness.  Lymphadenopathy:    He has no cervical adenopathy.  Neurological: He is alert and oriented to person, place, and time. He has normal reflexes. No cranial nerve deficit. He exhibits normal muscle tone. Coordination normal.  Skin: Skin is warm and dry. No rash noted. He is not diaphoretic.  Psychiatric: He has a normal mood and affect. His behavior is normal. Judgment and thought content normal.  AKs R hip is tender posteriorly w/ROM  Lab Results  Component Value Date   WBC 10.1 06/12/2011   HGB 12.4* 06/12/2011   HCT 37.8* 06/12/2011   PLT 130* 06/12/2011   GLUCOSE 127* 06/03/2011   CHOL 167 01/10/2007   TRIG 89 01/10/2007   HDL 47.7 01/10/2007   LDLCALC 102* 01/10/2007   ALT 11 06/03/2011   AST 17 06/03/2011   NA 138 06/03/2011   K 4.2 06/03/2011   CL 105 06/03/2011   CREATININE 0.85 06/10/2011   BUN 10 06/03/2011   CO2 28 06/03/2011   TSH 1.41 11/19/2009   PSA 0.46 01/10/2007   INR 1.06 06/03/2011   HGBA1C 6.1* 01/10/2007   Wife moved out in Nov 2014 - separated      Assessment & Plan:

## 2014-01-09 NOTE — Assessment & Plan Note (Signed)
OA, MSK

## 2014-01-10 ENCOUNTER — Telehealth: Payer: Self-pay | Admitting: Internal Medicine

## 2014-01-10 NOTE — Telephone Encounter (Signed)
emmi emailed °

## 2014-02-26 DIAGNOSIS — C61 Malignant neoplasm of prostate: Secondary | ICD-10-CM | POA: Diagnosis not present

## 2014-02-26 DIAGNOSIS — R972 Elevated prostate specific antigen [PSA]: Secondary | ICD-10-CM | POA: Diagnosis not present

## 2014-03-20 ENCOUNTER — Encounter: Payer: Self-pay | Admitting: Internal Medicine

## 2014-04-14 ENCOUNTER — Other Ambulatory Visit (INDEPENDENT_AMBULATORY_CARE_PROVIDER_SITE_OTHER): Payer: Medicare Other

## 2014-04-14 ENCOUNTER — Ambulatory Visit (INDEPENDENT_AMBULATORY_CARE_PROVIDER_SITE_OTHER): Payer: Medicare Other | Admitting: Internal Medicine

## 2014-04-14 ENCOUNTER — Encounter: Payer: Self-pay | Admitting: Internal Medicine

## 2014-04-14 VITALS — BP 170/92 | HR 64 | Temp 97.7°F | Wt 221.0 lb

## 2014-04-14 DIAGNOSIS — M545 Low back pain, unspecified: Secondary | ICD-10-CM

## 2014-04-14 DIAGNOSIS — K219 Gastro-esophageal reflux disease without esophagitis: Secondary | ICD-10-CM

## 2014-04-14 DIAGNOSIS — E559 Vitamin D deficiency, unspecified: Secondary | ICD-10-CM | POA: Diagnosis not present

## 2014-04-14 DIAGNOSIS — L570 Actinic keratosis: Secondary | ICD-10-CM | POA: Diagnosis not present

## 2014-04-14 DIAGNOSIS — I10 Essential (primary) hypertension: Secondary | ICD-10-CM | POA: Diagnosis not present

## 2014-04-14 DIAGNOSIS — Z8546 Personal history of malignant neoplasm of prostate: Secondary | ICD-10-CM | POA: Diagnosis not present

## 2014-04-14 DIAGNOSIS — Z23 Encounter for immunization: Secondary | ICD-10-CM

## 2014-04-14 LAB — CBC WITH DIFFERENTIAL/PLATELET
BASOS PCT: 0.3 % (ref 0.0–3.0)
Basophils Absolute: 0 10*3/uL (ref 0.0–0.1)
EOS PCT: 1.7 % (ref 0.0–5.0)
Eosinophils Absolute: 0.1 10*3/uL (ref 0.0–0.7)
HCT: 43.6 % (ref 39.0–52.0)
Hemoglobin: 14.3 g/dL (ref 13.0–17.0)
LYMPHS PCT: 20 % (ref 12.0–46.0)
Lymphs Abs: 1.2 10*3/uL (ref 0.7–4.0)
MCHC: 32.7 g/dL (ref 30.0–36.0)
MCV: 96.3 fl (ref 78.0–100.0)
MONOS PCT: 9.2 % (ref 3.0–12.0)
Monocytes Absolute: 0.5 10*3/uL (ref 0.1–1.0)
NEUTROS PCT: 68.8 % (ref 43.0–77.0)
Neutro Abs: 4.1 10*3/uL (ref 1.4–7.7)
Platelets: 175 10*3/uL (ref 150.0–400.0)
RBC: 4.53 Mil/uL (ref 4.22–5.81)
RDW: 13.7 % (ref 11.5–15.5)
WBC: 5.9 10*3/uL (ref 4.0–10.5)

## 2014-04-14 LAB — TSH: TSH: 1.28 u[IU]/mL (ref 0.35–4.50)

## 2014-04-14 MED ORDER — TERAZOSIN HCL 2 MG PO CAPS: ORAL_CAPSULE | ORAL | Status: DC

## 2014-04-14 NOTE — Assessment & Plan Note (Signed)
Continue with current prescription therapy as reflected on the Med list. BP nl at home

## 2014-04-14 NOTE — Assessment & Plan Note (Signed)
bx 11/15 ok

## 2014-04-14 NOTE — Assessment & Plan Note (Signed)
See cryo 

## 2014-04-14 NOTE — Assessment & Plan Note (Signed)
Continue with current prescription therapy as reflected on the Med list.  

## 2014-04-14 NOTE — Progress Notes (Signed)
Pre visit review using our clinic review tool, if applicable. No additional management support is needed unless otherwise documented below in the visit note. 

## 2014-04-14 NOTE — Assessment & Plan Note (Signed)
Continue with current prn non-prescription therapy

## 2014-04-14 NOTE — Addendum Note (Signed)
Addended by: Cresenciano Lick on: 04/14/2014 11:54 AM   Modules accepted: Orders

## 2014-04-14 NOTE — Progress Notes (Signed)
Subjective:    HPI   F/u LBP - better  F/u R hip pai w/walking F/u HH, HTN (nl BP at home), prostate ca - s/p bx 02/25/14 - ok F/u on insomnia - worse F/u GERD and indigestion - better  Nl BP at home this am    Wt Readings from Last 3 Encounters:  04/14/14 221 lb (100.245 kg)  01/09/14 219 lb (99.338 kg)  10/22/13 219 lb 6.4 oz (99.519 kg)   BP Readings from Last 3 Encounters:  04/14/14 170/92  01/09/14 162/82  10/22/13 120/72       Review of Systems  Constitutional: Negative for fever, chills, diaphoresis, appetite change, fatigue and unexpected weight change.  HENT: Negative for congestion, nosebleeds, sneezing, sore throat and trouble swallowing.   Eyes: Negative for itching and visual disturbance.  Respiratory: Negative for cough, choking, chest tightness and wheezing.   Cardiovascular: Negative for chest pain, palpitations and leg swelling.  Gastrointestinal: Negative for nausea, vomiting, abdominal pain, diarrhea, constipation, blood in stool, abdominal distention, anal bleeding and rectal pain.  Genitourinary: Negative for frequency and hematuria.  Musculoskeletal: Negative for back pain, joint swelling, gait problem and neck pain.  Skin: Negative for rash.  Neurological: Negative for dizziness, tremors, speech difficulty and weakness.  Psychiatric/Behavioral: Negative for sleep disturbance, dysphoric mood and agitation. The patient is not nervous/anxious.        Objective:   Physical Exam  Constitutional: He is oriented to person, place, and time. He appears well-developed. No distress.  NAD  HENT:  Mouth/Throat: Oropharynx is clear and moist.  Eyes: Conjunctivae are normal. Pupils are equal, round, and reactive to light.  Neck: Normal range of motion. No JVD present. No thyromegaly present.  Cardiovascular: Normal rate, regular rhythm, normal heart sounds and intact distal pulses.  Exam reveals no gallop and no friction rub.   No murmur  heard. Pulmonary/Chest: Effort normal and breath sounds normal. No respiratory distress. He has no wheezes. He has no rales. He exhibits no tenderness.  Abdominal: Soft. Bowel sounds are normal. He exhibits no distension and no mass. There is no tenderness. There is no rebound and no guarding.  Musculoskeletal: Normal range of motion. He exhibits no edema or tenderness.  Lymphadenopathy:    He has no cervical adenopathy.  Neurological: He is alert and oriented to person, place, and time. He has normal reflexes. No cranial nerve deficit. He exhibits normal muscle tone. He displays a negative Romberg sign. Coordination and gait normal.  No meningeal signs  Skin: Skin is warm and dry. No rash noted.  Psychiatric: He has a normal mood and affect. His behavior is normal. Judgment and thought content normal.  AKs R hip is tender posteriorly w/ROM  Lab Results  Component Value Date   WBC 10.1 06/12/2011   HGB 12.4* 06/12/2011   HCT 37.8* 06/12/2011   PLT 130* 06/12/2011   GLUCOSE 127* 06/03/2011   CHOL 167 01/10/2007   TRIG 89 01/10/2007   HDL 47.7 01/10/2007   LDLCALC 102* 01/10/2007   ALT 11 06/03/2011   AST 17 06/03/2011   NA 138 06/03/2011   K 4.2 06/03/2011   CL 105 06/03/2011   CREATININE 0.85 06/10/2011   BUN 10 06/03/2011   CO2 28 06/03/2011   TSH 1.41 11/19/2009   PSA 0.46 01/10/2007   INR 1.06 06/03/2011   HGBA1C 6.1* 01/10/2007     Procedure Note :     Procedure : Cryosurgery   Indication:   Actinic keratosis(es)  Risks including unsuccessful procedure , bleeding, infection, bruising, scar, a need for a repeat  procedure and others were explained to the patient in detail as well as the benefits. Informed consent was obtained verbally.   2lesion(s)  on  R post neck and on L ear  was/were treated with liquid nitrogen on a Q-tip in a usual fasion . Band-Aid was applied and antibiotic ointment was given for a later use.   Tolerated well. Complications none.    Postprocedure instructions :     Keep the wounds clean. You can wash them with liquid soap and water. Pat dry with gauze or a Kleenex tissue  Before applying antibiotic ointment and a Band-Aid.   You need to report immediately  if  any signs of infection develop.           Assessment & Plan:

## 2014-04-16 ENCOUNTER — Other Ambulatory Visit (INDEPENDENT_AMBULATORY_CARE_PROVIDER_SITE_OTHER): Payer: Medicare Other

## 2014-04-16 DIAGNOSIS — K219 Gastro-esophageal reflux disease without esophagitis: Secondary | ICD-10-CM

## 2014-04-16 DIAGNOSIS — Z8546 Personal history of malignant neoplasm of prostate: Secondary | ICD-10-CM

## 2014-04-16 DIAGNOSIS — E559 Vitamin D deficiency, unspecified: Secondary | ICD-10-CM

## 2014-04-16 DIAGNOSIS — I10 Essential (primary) hypertension: Secondary | ICD-10-CM | POA: Diagnosis not present

## 2014-04-16 DIAGNOSIS — M545 Low back pain: Secondary | ICD-10-CM | POA: Diagnosis not present

## 2014-04-16 LAB — HEPATIC FUNCTION PANEL
ALT: 13 U/L (ref 0–53)
AST: 20 U/L (ref 0–37)
Albumin: 3.8 g/dL (ref 3.5–5.2)
Alkaline Phosphatase: 92 U/L (ref 39–117)
Bilirubin, Direct: 0.1 mg/dL (ref 0.0–0.3)
TOTAL PROTEIN: 6.6 g/dL (ref 6.0–8.3)
Total Bilirubin: 0.3 mg/dL (ref 0.2–1.2)

## 2014-04-16 LAB — BASIC METABOLIC PANEL
BUN: 13 mg/dL (ref 6–23)
CO2: 24 meq/L (ref 19–32)
CREATININE: 0.87 mg/dL (ref 0.40–1.50)
Calcium: 9 mg/dL (ref 8.4–10.5)
Chloride: 108 mEq/L (ref 96–112)
GFR: 89.2 mL/min (ref >60.00)
Glucose, Bld: 102 mg/dL — ABNORMAL HIGH (ref 70–99)
Potassium: 4.6 mEq/L (ref 3.5–5.1)
Sodium: 138 mEq/L (ref 135–145)

## 2014-06-16 DIAGNOSIS — C61 Malignant neoplasm of prostate: Secondary | ICD-10-CM | POA: Diagnosis not present

## 2014-06-20 DIAGNOSIS — R55 Syncope and collapse: Secondary | ICD-10-CM | POA: Diagnosis not present

## 2014-07-16 DIAGNOSIS — N138 Other obstructive and reflux uropathy: Secondary | ICD-10-CM | POA: Diagnosis not present

## 2014-07-16 DIAGNOSIS — N401 Enlarged prostate with lower urinary tract symptoms: Secondary | ICD-10-CM | POA: Diagnosis not present

## 2014-07-16 DIAGNOSIS — C61 Malignant neoplasm of prostate: Secondary | ICD-10-CM | POA: Diagnosis not present

## 2014-08-18 ENCOUNTER — Ambulatory Visit (INDEPENDENT_AMBULATORY_CARE_PROVIDER_SITE_OTHER): Payer: Medicare Other | Admitting: Internal Medicine

## 2014-08-18 ENCOUNTER — Encounter: Payer: Self-pay | Admitting: Internal Medicine

## 2014-08-18 VITALS — BP 150/96 | HR 71 | Wt 222.0 lb

## 2014-08-18 DIAGNOSIS — G4700 Insomnia, unspecified: Secondary | ICD-10-CM

## 2014-08-18 DIAGNOSIS — E559 Vitamin D deficiency, unspecified: Secondary | ICD-10-CM

## 2014-08-18 DIAGNOSIS — L570 Actinic keratosis: Secondary | ICD-10-CM

## 2014-08-18 DIAGNOSIS — L723 Sebaceous cyst: Secondary | ICD-10-CM

## 2014-08-18 DIAGNOSIS — I10 Essential (primary) hypertension: Secondary | ICD-10-CM | POA: Diagnosis not present

## 2014-08-18 MED ORDER — TRAZODONE HCL 50 MG PO TABS: 25.0000 mg | ORAL_TABLET | Freq: Every evening | ORAL | Status: DC | PRN

## 2014-08-18 MED ORDER — VITAMIN D 1000 UNITS PO TABS: 1000.0000 [IU] | ORAL_TABLET | Freq: Every day | ORAL | Status: AC

## 2014-08-18 NOTE — Assessment & Plan Note (Signed)
Procedure Note :     Procedure : Cryosurgery   Indication:    Actinic keratosis(es)   Risks including unsuccessful procedure , bleeding, infection, bruising, scar, a need for a repeat  procedure and others were explained to the patient in detail as well as the benefits. Informed consent was obtained verbally.   3  lesion(s)  on face   was/were treated with liquid nitrogen on a Q-tip in a usual fasion . Band-Aid was applied and antibiotic ointment was given for a later use.   Tolerated well. Complications none.   Postprocedure instructions :     Keep the wounds clean. You can wash them with liquid soap and water. Pat dry with gauze or a Kleenex tissue  Before applying antibiotic ointment and a Band-Aid.   You need to report immediately  if  any signs of infection develop.

## 2014-08-18 NOTE — Progress Notes (Signed)
Pre visit review using our clinic review tool, if applicable. No additional management support is needed unless otherwise documented below in the visit note. 

## 2014-08-18 NOTE — Assessment & Plan Note (Signed)
5/16 Trazodone w/caution

## 2014-08-18 NOTE — Progress Notes (Signed)
Subjective:  Patient ID: Jorge [name], male    DOB: 06/12/1929  Age: 80 y.o. MRN: [id number]  CC: No chief complaint on file.   HPI Jorge [name] presents for a f/u HTN, AKs C/o insomnia C/o cyst on back  Outpatient Prescriptions Prior to Visit  Medication Sig Dispense Refill  . Docusate Calcium (STOOL SOFTENER PO) Take by mouth daily.    [name] losartan (COZAAR) 50 MG tablet Take 1 tablet (50 mg total) by mouth daily. 90 tablet 3  . terazosin (HYTRIN) 2 MG capsule TAKE ONE CAPSULE BY MOUTH AT BEDTIME 90 capsule 3   No facility-administered medications prior to visit.    ROS Review of Systems  Objective:  BP 150/96 mmHg  Pulse 71  Wt 222 lb (100.699 kg)  SpO2 98%  BP Readings from Last 3 Encounters:  08/18/14 150/96  04/14/14 170/92  01/09/14 162/82    Wt Readings from Last 3 Encounters:  08/18/14 222 lb (100.699 kg)  04/14/14 221 lb (100.245 kg)  01/09/14 219 lb (99.338 kg)    Physical Exam  Lab Results  Component Value Date   WBC 5.9 04/14/2014   HGB 14.3 04/14/2014   HCT 43.6 04/14/2014   PLT 175.0 04/14/2014   GLUCOSE 102* 04/16/2014   CHOL 167 01/10/2007   TRIG 89 01/10/2007   HDL 47.7 01/10/2007   LDLCALC 102* 01/10/2007   ALT 13 04/16/2014   AST 20 04/16/2014   NA 138 04/16/2014   K 4.6 04/16/2014   CL 108 04/16/2014   CREATININE 0.87 04/16/2014   BUN 13 04/16/2014   CO2 24 04/16/2014   TSH 1.28 04/14/2014   PSA 0.46 01/10/2007   INR 1.06 06/03/2011   HGBA1C 6.1* 01/10/2007   AKs on face   Procedure Note :     Procedure : Cryosurgery   Indication: Actinic keratosis(es) x3 face   Risks including unsuccessful procedure , bleeding, infection, bruising, scar, a need for a repeat  procedure and others were explained to the patient in detail as well as the benefits. Informed consent was obtained verbally.     lesion(s)  on    was/were treated with liquid nitrogen on a Q-tip in a usual fasion . Band-Aid was applied and antibiotic  ointment was given for a later use.   Tolerated well. Complications none.   Postprocedure instructions :     Keep the wounds clean. You can wash them with liquid soap and water. Pat dry with gauze or a Kleenex tissue  Before applying antibiotic ointment and a Band-Aid.   You need to report immediately  if  any signs of infection develop.    Dg Hip Complete Right  07/23/2013   This is the back office imaging final text. See progress note  for physician's narrative and interpretation.    Assessment & Plan:   Diagnoses and all orders for this visit:  Essential hypertension  Insomnia  Vitamin D deficiency  Actinic keratoses  Other orders -     cholecalciferol (VITAMIN D) 1000 UNITS tablet; Take 1 tablet (1,000 Units total) by mouth daily. -     traZODone (DESYREL) 50 MG tablet; Take 0.5-1 tablets (25-50 mg total) by mouth at bedtime as needed for sleep.   I am having [name] start on cholecalciferol and traZODone. I am also having him maintain his Docusate Calcium (STOOL SOFTENER PO), losartan, and terazosin.  Meds ordered this encounter  Medications  . cholecalciferol (VITAMIN D) 1000 UNITS tablet  Sig: Take 1 tablet (1,000 Units total) by mouth daily.    Dispense:  100 tablet    Refill:  3  . traZODone (DESYREL) 50 MG tablet    Sig: Take 0.5-1 tablets (25-50 mg total) by mouth at bedtime as needed for sleep.    Dispense:  30 tablet    Refill:  3     Follow-up: Return in about 3 months (around 11/18/2014) for a follow-up visit.  [name], MD

## 2014-08-18 NOTE — Assessment & Plan Note (Signed)
5/16 post neck - large Surg ref

## 2014-08-18 NOTE — Assessment & Plan Note (Signed)
On Vit D 

## 2014-08-18 NOTE — Assessment & Plan Note (Signed)
BP ok at home Losartan in am; Hytrin at hs

## 2014-08-18 NOTE — Patient Instructions (Signed)
   Postprocedure instructions :     Keep the wounds clean. You can wash them with liquid soap and water. Pat dry with gauze or a Kleenex tissue  Before applying antibiotic ointment and a Band-Aid.   You need to report immediately  if  any signs of infection develop.    

## 2014-09-09 DIAGNOSIS — L720 Epidermal cyst: Secondary | ICD-10-CM | POA: Diagnosis not present

## 2014-09-13 ENCOUNTER — Other Ambulatory Visit: Payer: Self-pay | Admitting: Internal Medicine

## 2014-09-18 DIAGNOSIS — H35052 Retinal neovascularization, unspecified, left eye: Secondary | ICD-10-CM | POA: Diagnosis not present

## 2014-09-18 DIAGNOSIS — H26492 Other secondary cataract, left eye: Secondary | ICD-10-CM | POA: Diagnosis not present

## 2014-09-18 DIAGNOSIS — H26491 Other secondary cataract, right eye: Secondary | ICD-10-CM | POA: Diagnosis not present

## 2014-09-18 DIAGNOSIS — Z961 Presence of intraocular lens: Secondary | ICD-10-CM | POA: Diagnosis not present

## 2014-10-14 ENCOUNTER — Encounter (HOSPITAL_BASED_OUTPATIENT_CLINIC_OR_DEPARTMENT_OTHER): Payer: Self-pay | Admitting: *Deleted

## 2014-10-15 ENCOUNTER — Encounter (HOSPITAL_BASED_OUTPATIENT_CLINIC_OR_DEPARTMENT_OTHER)
Admission: RE | Admit: 2014-10-15 | Discharge: 2014-10-15 | Disposition: A | Discharge status: Home / Self Care | Payer: Medicare Other | Source: Ambulatory Visit | Attending: General Surgery | Admitting: General Surgery

## 2014-10-15 DIAGNOSIS — L720 Epidermal cyst: Secondary | ICD-10-CM | POA: Diagnosis not present

## 2014-10-15 DIAGNOSIS — L723 Sebaceous cyst: Secondary | ICD-10-CM | POA: Diagnosis not present

## 2014-10-15 DIAGNOSIS — Z87891 Personal history of nicotine dependence: Secondary | ICD-10-CM | POA: Diagnosis not present

## 2014-10-15 DIAGNOSIS — K219 Gastro-esophageal reflux disease without esophagitis: Secondary | ICD-10-CM | POA: Diagnosis not present

## 2014-10-15 DIAGNOSIS — I10 Essential (primary) hypertension: Secondary | ICD-10-CM | POA: Diagnosis not present

## 2014-10-15 LAB — BASIC METABOLIC PANEL
Anion gap: 3 — ABNORMAL LOW (ref 5–15)
BUN: 14 mg/dL (ref 6–20)
CALCIUM: 9.6 mg/dL (ref 8.9–10.3)
CO2: 28 mmol/L (ref 22–32)
Chloride: 110 mmol/L (ref 101–111)
Creatinine, Ser: 1 mg/dL (ref 0.61–1.24)
GFR calc Af Amer: >60 mL/min (ref >60)
GFR calc non Af Amer: >60 mL/min (ref >60)
GLUCOSE: 105 mg/dL — AB (ref 65–99)
POTASSIUM: 5.7 mmol/L — AB (ref 3.5–5.1)
Sodium: 141 mmol/L (ref 135–145)

## 2014-10-16 ENCOUNTER — Encounter (HOSPITAL_BASED_OUTPATIENT_CLINIC_OR_DEPARTMENT_OTHER): Payer: Self-pay

## 2014-10-16 ENCOUNTER — Ambulatory Visit (HOSPITAL_BASED_OUTPATIENT_CLINIC_OR_DEPARTMENT_OTHER)
Admission: RE | Admit: 2014-10-16 | Discharge: 2014-10-16 | Disposition: A | Discharge status: Home / Self Care | Payer: Medicare Other | Source: Ambulatory Visit | Attending: General Surgery | Admitting: General Surgery

## 2014-10-16 ENCOUNTER — Ambulatory Visit (HOSPITAL_BASED_OUTPATIENT_CLINIC_OR_DEPARTMENT_OTHER): Payer: Medicare Other | Admitting: Anesthesiology

## 2014-10-16 ENCOUNTER — Encounter (HOSPITAL_BASED_OUTPATIENT_CLINIC_OR_DEPARTMENT_OTHER)
Admission: RE | Disposition: A | Discharge status: Home / Self Care | Payer: Self-pay | Source: Ambulatory Visit | Attending: General Surgery

## 2014-10-16 DIAGNOSIS — R221 Localized swelling, mass and lump, neck: Secondary | ICD-10-CM | POA: Diagnosis not present

## 2014-10-16 DIAGNOSIS — K219 Gastro-esophageal reflux disease without esophagitis: Secondary | ICD-10-CM | POA: Diagnosis not present

## 2014-10-16 DIAGNOSIS — L723 Sebaceous cyst: Secondary | ICD-10-CM | POA: Insufficient documentation

## 2014-10-16 DIAGNOSIS — I10 Essential (primary) hypertension: Secondary | ICD-10-CM | POA: Insufficient documentation

## 2014-10-16 DIAGNOSIS — Z87891 Personal history of nicotine dependence: Secondary | ICD-10-CM | POA: Insufficient documentation

## 2014-10-16 DIAGNOSIS — L720 Epidermal cyst: Secondary | ICD-10-CM | POA: Diagnosis not present

## 2014-10-16 HISTORY — PX: CYST REMOVAL NECK: SHX6281

## 2014-10-16 LAB — POCT I-STAT, CHEM 8
BUN: 18 mg/dL (ref 6–20)
CHLORIDE: 109 mmol/L (ref 101–111)
Calcium, Ion: 1.13 mmol/L (ref 1.13–1.30)
Creatinine, Ser: 0.9 mg/dL (ref 0.61–1.24)
Glucose, Bld: 102 mg/dL — ABNORMAL HIGH (ref 65–99)
HCT: 45 % (ref 39.0–52.0)
HEMOGLOBIN: 15.3 g/dL (ref 13.0–17.0)
Potassium: 4.2 mmol/L (ref 3.5–5.1)
Sodium: 141 mmol/L (ref 135–145)
TCO2: 19 mmol/L (ref 0–100)

## 2014-10-16 SURGERY — EXCISION, CYST, NECK
Anesthesia: General | Site: Neck

## 2014-10-16 MED ORDER — FENTANYL CITRATE (PF) 100 MCG/2ML IJ SOLN
50.0000 ug | INTRAMUSCULAR | Status: DC | PRN
  Administered 2014-10-16: 100 ug via INTRAVENOUS

## 2014-10-16 MED ORDER — ONDANSETRON HCL 4 MG/2ML IJ SOLN
INTRAMUSCULAR | Status: DC | PRN
  Administered 2014-10-16: 4 mg via INTRAVENOUS

## 2014-10-16 MED ORDER — MIDAZOLAM HCL 2 MG/2ML IJ SOLN: 1.0000 mg | INTRAMUSCULAR | Status: DC | PRN

## 2014-10-16 MED ORDER — GLYCOPYRROLATE 0.2 MG/ML IJ SOLN: 0.2000 mg | Freq: Once | INTRAMUSCULAR | Status: DC | PRN

## 2014-10-16 MED ORDER — OXYCODONE-ACETAMINOPHEN 5-325 MG PO TABS: 1.0000 | ORAL_TABLET | ORAL | Status: DC | PRN

## 2014-10-16 MED ORDER — LACTATED RINGERS IV SOLN
INTRAVENOUS | Status: DC
  Administered 2014-10-16 (×2): via INTRAVENOUS

## 2014-10-16 MED ORDER — LIDOCAINE HCL (CARDIAC) 20 MG/ML IV SOLN
INTRAVENOUS | Status: DC | PRN
  Administered 2014-10-16: 50 mg via INTRAVENOUS

## 2014-10-16 MED ORDER — BUPIVACAINE-EPINEPHRINE 0.25% -1:200000 IJ SOLN
INTRAMUSCULAR | Status: DC | PRN
Start: 2014-10-16 — End: 2014-10-16
  Administered 2014-10-16: 20 mL

## 2014-10-16 MED ORDER — PROPOFOL 10 MG/ML IV BOLUS
INTRAVENOUS | Status: DC | PRN
  Administered 2014-10-16: 200 mg via INTRAVENOUS

## 2014-10-16 MED ORDER — SCOPOLAMINE 1 MG/3DAYS TD PT72: 1.0000 | MEDICATED_PATCH | Freq: Once | TRANSDERMAL | Status: DC | PRN

## 2014-10-16 MED ORDER — SUCCINYLCHOLINE CHLORIDE 20 MG/ML IJ SOLN
INTRAMUSCULAR | Status: DC | PRN
Start: 2014-10-16 — End: 2014-10-16
  Administered 2014-10-16: 100 mg via INTRAVENOUS
  Administered 2014-10-16 (×2): 50 mg via INTRAVENOUS

## 2014-10-16 MED ORDER — FENTANYL CITRATE (PF) 100 MCG/2ML IJ SOLN
INTRAMUSCULAR | Status: AC
Start: 2014-10-16 — End: 2014-10-16
  Filled 2014-10-16: qty 4

## 2014-10-16 MED ORDER — FENTANYL CITRATE (PF) 100 MCG/2ML IJ SOLN: 25.0000 ug | INTRAMUSCULAR | Status: DC | PRN

## 2014-10-16 MED ORDER — MEPERIDINE HCL 25 MG/ML IJ SOLN: 6.2500 mg | INTRAMUSCULAR | Status: DC | PRN

## 2014-10-16 SURGICAL SUPPLY — 57 items
BENZOIN TINCTURE PRP APPL 2/3 (GAUZE/BANDAGES/DRESSINGS) IMPLANT
BLADE SURG 10 STRL SS (BLADE) ×2 IMPLANT
BLADE SURG 15 STRL LF DISP TIS (BLADE) ×1 IMPLANT
BLADE SURG 15 STRL SS (BLADE) ×1
CANISTER SUCT 1200ML W/VALVE (MISCELLANEOUS) ×2 IMPLANT
CHLORAPREP W/TINT 26ML (MISCELLANEOUS) ×2 IMPLANT
CLEANER CAUTERY TIP 5X5 PAD (MISCELLANEOUS) IMPLANT
COVER BACK TABLE 60X90IN (DRAPES) ×2 IMPLANT
COVER MAYO STAND STRL (DRAPES) ×2 IMPLANT
DECANTER SPIKE VIAL GLASS SM (MISCELLANEOUS) IMPLANT
DRAPE LAPAROTOMY 100X72 PEDS (DRAPES) ×2 IMPLANT
DRAPE SURG 17X23 STRL (DRAPES) ×2 IMPLANT
DRAPE UTILITY XL STRL (DRAPES) ×2 IMPLANT
DRSG TEGADERM 4X4.75 (GAUZE/BANDAGES/DRESSINGS) IMPLANT
ELECT REM PT RETURN 9FT ADLT (ELECTROSURGICAL) ×2
ELECTRODE REM PT RTRN 9FT ADLT (ELECTROSURGICAL) ×1 IMPLANT
GAUZE SPONGE 4X4 16PLY XRAY LF (GAUZE/BANDAGES/DRESSINGS) IMPLANT
GLOVE BIO SURGEON STRL SZ7.5 (GLOVE) ×2 IMPLANT
GLOVE BIOGEL PI IND STRL 7.0 (GLOVE) ×2 IMPLANT
GLOVE BIOGEL PI IND STRL 7.5 (GLOVE) ×1 IMPLANT
GLOVE BIOGEL PI INDICATOR 7.0 (GLOVE) ×2
GLOVE BIOGEL PI INDICATOR 7.5 (GLOVE) ×1
GLOVE ECLIPSE 6.5 STRL STRAW (GLOVE) ×2 IMPLANT
GLOVE SURG SS PI 7.5 STRL IVOR (GLOVE) ×2 IMPLANT
GOWN STRL REUS W/ TWL LRG LVL3 (GOWN DISPOSABLE) ×2 IMPLANT
GOWN STRL REUS W/ TWL XL LVL3 (GOWN DISPOSABLE) ×1 IMPLANT
GOWN STRL REUS W/TWL LRG LVL3 (GOWN DISPOSABLE) ×2
GOWN STRL REUS W/TWL XL LVL3 (GOWN DISPOSABLE) ×1
LIQUID BAND (GAUZE/BANDAGES/DRESSINGS) ×2 IMPLANT
NEEDLE HYPO 25X1 1.5 SAFETY (NEEDLE) ×2 IMPLANT
NS IRRIG 1000ML POUR BTL (IV SOLUTION) ×2 IMPLANT
PACK BASIN DAY SURGERY FS (CUSTOM PROCEDURE TRAY) ×2 IMPLANT
PAD CLEANER CAUTERY TIP 5X5 (MISCELLANEOUS)
PENCIL BUTTON HOLSTER BLD 10FT (ELECTRODE) ×2 IMPLANT
SPONGE GAUZE 2X2 8PLY STRL LF (GAUZE/BANDAGES/DRESSINGS) IMPLANT
SPONGE LAP 18X18 X RAY DECT (DISPOSABLE) ×2 IMPLANT
STRIP CLOSURE SKIN 1/2X4 (GAUZE/BANDAGES/DRESSINGS) IMPLANT
STRIP CLOSURE SKIN 1/4X4 (GAUZE/BANDAGES/DRESSINGS) IMPLANT
SUT CHROMIC 3 0 SH 27 (SUTURE) IMPLANT
SUT ETHILON 3 0 PS 1 (SUTURE) IMPLANT
SUT MON AB 4-0 PC3 18 (SUTURE) ×2 IMPLANT
SUT PROLENE 3 0 PS 2 (SUTURE) IMPLANT
SUT SILK 2 0 FS (SUTURE) IMPLANT
SUT VIC AB 3-0 54X BRD REEL (SUTURE) IMPLANT
SUT VIC AB 3-0 BRD 54 (SUTURE)
SUT VIC AB 3-0 FS2 27 (SUTURE) IMPLANT
SUT VIC AB 3-0 SH 27 (SUTURE) ×1
SUT VIC AB 3-0 SH 27X BRD (SUTURE) ×1 IMPLANT
SUT VIC AB 4-0 RB1 27 (SUTURE)
SUT VIC AB 4-0 RB1 27X BRD (SUTURE) IMPLANT
SUT VICRYL 4-0 PS2 18IN ABS (SUTURE) IMPLANT
SUT VICRYL AB 3 0 TIES (SUTURE) IMPLANT
SYR CONTROL 10ML LL (SYRINGE) ×2 IMPLANT
TOWEL OR 17X24 6PK STRL BLUE (TOWEL DISPOSABLE) ×2 IMPLANT
TOWEL OR NON WOVEN STRL DISP B (DISPOSABLE) IMPLANT
TUBE CONNECTING 20X1/4 (TUBING) ×2 IMPLANT
YANKAUER SUCT BULB TIP NO VENT (SUCTIONS) ×2 IMPLANT

## 2014-10-16 NOTE — Anesthesia Preprocedure Evaluation (Addendum)
Anesthesia Evaluation  Patient identified by MRN, date of birth, ID band Patient awake    Reviewed: Allergy & Precautions, NPO status , Patient's Chart, lab work & pertinent test results  Airway Mallampati: I  TM Distance: >3 FB Neck ROM: Full    Dental  (+) Teeth Intact, Upper Dentures, Partial Lower, Dental Advisory Given   Pulmonary former smoker,  breath sounds clear to auscultation        Cardiovascular hypertension, Pt. on medications Rhythm:Regular Rate:Normal     Neuro/Psych    GI/Hepatic GERD-  Medicated and Controlled,  Endo/Other    Renal/GU      Musculoskeletal   Abdominal   Peds  Hematology   Anesthesia Other Findings   Reproductive/Obstetrics                            Anesthesia Physical Anesthesia Plan  ASA: II  Anesthesia Plan: General   Post-op Pain Management:    Induction: Intravenous  Airway Management Planned: Oral ETT  Additional Equipment:   Intra-op Plan:   Post-operative Plan: Extubation in OR  Informed Consent: I have reviewed the patients History and Physical, chart, labs and discussed the procedure including the risks, benefits and alternatives for the proposed anesthesia with the patient or authorized representative who has indicated his/her understanding and acceptance.   Dental advisory given  Plan Discussed with: CRNA, Surgeon and Anesthesiologist  Anesthesia Plan Comments:         Anesthesia Quick Evaluation

## 2014-10-16 NOTE — H&P (Signed)
Jorge [name] 09/09/2014 9:47 AM Location: [facility] Patient #: [id number] DOB: 18-Jun-1929 Separated / Language: Jorge [name] / Race: White Male  History of Present Illness Jorge [name]. Jorge Starks MD; 09/09/2014 10:10 AM) Patient words: seb cyst.  The patient is a 81 year old male who presents with a neck mass. We are asked to see the patient in consultation by Dr. Rosana Berger to evaluate him for a cyst on the posterior neck. The patient is an 81 year old white male who has had this cyst on the back of his neck for several years. It recently got larger and is now starting to cause him some discomfort. He denies any fevers or chills. He has had no drainage from the area.   Other Problems Jorge [name], CMA; 09/09/2014 9:59 AM) Arthritis Back Pain Depression Gastric Ulcer Gastroesophageal Reflux Disease High blood pressure Prostate Cancer Sleep Apnea Umbilical Hernia Repair  Past Surgical History Jorge [name], CMA; 09/09/2014 9:58 AM) Appendectomy Laparoscopic Inguinal Hernia Surgery Left. TURP  Diagnostic Studies History Jorge [name], Jorge [name]; 09/09/2014 9:58 AM) Colonoscopy 5-10 years ago  Allergies Jorge [name], CMA; 09/09/2014 9:49 AM) Aspirin *ANALGESICS - NonNarcotic* Azithromycin *CHEMICALS* Cefuroxime Axetil *CEPHALOSPORINS* Iodine (Antiseptic) *ANTISEPTICS & DISINFECTANTS* Naproxen *ANALGESICS - ANTI-INFLAMMATORY*  Medication History (Jorge [name], CMA; 09/09/2014 9:50 AM) Losartan Potassium (50MG  Tablet, Oral) Active. Terazosin HCl (2MG  Capsule, Oral) Active. Docusate Calcium (240MG  Capsule, Oral) Active. Vitamin D (1000UNIT Capsule, Oral) Active. Medications Reconciled  Social History Jorge [name], Jorge [name]; 09/09/2014 9:59 AM) Alcohol use Occasional alcohol use. No caffeine use No drug use Tobacco use Never smoker.  Family History Jorge [name], Jorge [name]; 09/09/2014 9:59 AM) Breast Cancer Mother. Depression  Brother. Hypertension Brother. Malignant Neoplasm Of Pancreas Father. Prostate Cancer Brother.  Review of Systems (Jorge [name] CMA; 09/09/2014 9:59 AM) General Present- Fatigue and Weight Gain. Not Present- Appetite Loss, Chills, Fever, Night Sweats and Weight Loss. Skin Present- Dryness. Not Present- Change in Wart/Mole, Hives, Jaundice, New Lesions, Non-Healing Wounds, Rash and Ulcer. HEENT Present- Hoarseness and Ringing in the Ears. Not Present- Earache, Hearing Loss, Nose Bleed, Oral Ulcers, Seasonal Allergies, Sinus Pain, Sore Throat, Visual Disturbances, Wears glasses/contact lenses and Yellow Eyes. Respiratory Not Present- Bloody sputum, Chronic Cough, Difficulty Breathing, Snoring and Wheezing. Breast Not Present- Breast Mass, Breast Pain, Nipple Discharge and Skin Changes. Cardiovascular Present- Difficulty Breathing Lying Down. Not Present- Chest Pain, Leg Cramps, Palpitations, Rapid Heart Rate, Shortness of Breath and Swelling of Extremities. Gastrointestinal Present- Constipation. Not Present- Abdominal Pain, Bloating, Bloody Stool, Change in Bowel Habits, Chronic diarrhea, Difficulty Swallowing, Excessive gas, Gets full quickly at meals, Hemorrhoids, Indigestion, Nausea, Rectal Pain and Vomiting. Male Genitourinary Not Present- Blood in Urine, Change in Urinary Stream, Frequency, Impotence, Nocturia, Painful Urination, Urgency and Urine Leakage. Musculoskeletal Not Present- Back Pain, Joint Pain, Joint Stiffness, Muscle Pain, Muscle Weakness and Swelling of Extremities. Neurological Not Present- Decreased Memory, Fainting, Headaches, Numbness, Seizures, Tingling, Tremor, Trouble walking and Weakness. Psychiatric Present- Change in Sleep Pattern and Depression. Not Present- Anxiety, Bipolar, Fearful and Frequent crying. Endocrine Present- Cold Intolerance. Not Present- Excessive Hunger, Hair Changes, Heat Intolerance, Hot flashes and New Diabetes. Hematology Present- Easy  Bruising. Not Present- Excessive bleeding, Gland problems, HIV and Persistent Infections.   Vitals (Jorge [name] CMA; 09/09/2014 9:48 AM) 09/09/2014 9:48 AM Weight: 226.6 lb Height: 74in Body Surface Area: 2.32 m Body Mass Index: 29.09 kg/m Temp.: 98.94F(Temporal)  Pulse: 75 (Regular)  BP: 130/72 (Sitting, Left Arm, Standard)    Physical Exam Jorge [name]  Jorge Gallery MD; 09/09/2014 10:11 AM) General Mental Status-Alert. General Appearance-Consistent with stated age. Hydration-Well hydrated. Voice-Normal.  Head and Neck Head-normocephalic, atraumatic with no lesions or palpable masses. Trachea-midline. Thyroid Gland Characteristics - normal size and consistency. Note: There is a 3-4 cm cyst on the posterior neck that seems to be isolated and the skin and subcutaneous tissue. It is mobile. There is no sign of infection.   Eye Eyeball - Bilateral-Extraocular movements intact. Sclera/Conjunctiva - Bilateral-No scleral icterus.  Chest and Lung Exam Chest and lung exam reveals -quiet, even and easy respiratory effort with no use of accessory muscles and on auscultation, normal breath sounds, no adventitious sounds and normal vocal resonance. Inspection Chest Wall - Normal. Back - normal.  Cardiovascular Cardiovascular examination reveals -normal heart sounds, regular rate and rhythm with no murmurs and normal pedal pulses bilaterally.  Abdomen Inspection Inspection of the abdomen reveals - No Hernias. Skin - Scar - no surgical scars. Palpation/Percussion Palpation and Percussion of the abdomen reveal - Soft, Non Tender, No Rebound tenderness, No Rigidity (guarding) and No hepatosplenomegaly. Auscultation Auscultation of the abdomen reveals - Bowel sounds normal.  Neurologic Neurologic evaluation reveals -alert and oriented x 3 with no impairment of recent or remote memory. Mental Status-Normal.  Musculoskeletal Normal Exam - Left-Upper Extremity  Strength Normal and Lower Extremity Strength Normal. Normal Exam - Right-Upper Extremity Strength Normal and Lower Extremity Strength Normal.  Lymphatic Head & Neck  General Head & Neck Lymphatics: Bilateral - Description - Normal. Axillary  General Axillary Region: Bilateral - Description - Normal. Tenderness - Non Tender. Femoral & Inguinal  Generalized Femoral & Inguinal Lymphatics: Bilateral - Description - Normal. Tenderness - Non Tender.    Assessment & Plan Jorge [name] S. Toth MD; 09/09/2014 10:08 AM) EPIDERMAL CYST OF NECK (706.2  L72.0) Impression: The patient has a 3-4 cm cyst on the posterior neck. Because of its size and the fact that it causes him some discomfort I think it would be reasonable to remove it. I have discussed with him in detail the risks and benefits of the operation to remove this cyst as well as some of the technical aspects and he understands and wishes to proceed Current Plans  Pt Education - Skin Conditions: discussed with patient and provided information.   Signed by Luella Cook, MD (09/09/2014 10:12 AM)

## 2014-10-16 NOTE — Anesthesia Postprocedure Evaluation (Signed)
  Anesthesia Post-op Note  Patient: Jorge [name]  Procedure(s) Performed: Procedure(s) with comments: EXCISION CYST POSTERIOR NECK (N/A) - posterior  Patient Location: PACU  Anesthesia Type: General   Level of Consciousness: awake, alert  and oriented  Airway and Oxygen Therapy: Patient Spontanous Breathing  Post-op Pain: mild  Post-op Assessment: Post-op Vital signs reviewed  Post-op Vital Signs: Reviewed  Last Vitals:  Filed Vitals:   10/16/14 1222  BP: 157/73  Pulse: 82  Temp:   Resp: 16    Complications: No apparent anesthesia complications

## 2014-10-16 NOTE — Interval H&P Note (Signed)
History and Physical Interval Note:  10/16/2014 7:21 AM  Jorge [name]  has presented today for surgery, with the diagnosis of Cyst Posterior Neck  The various methods of treatment have been discussed with the patient and family. After consideration of risks, benefits and other options for treatment, the patient has consented to  Procedure(s): EXCISION CYST POSTERIOR NECK (N/A) as a surgical intervention .  The patient's history has been reviewed, patient examined, no change in status, stable for surgery.  I have reviewed the patient's chart and labs.  Questions were answered to the patient's satisfaction.     TOTH III,Tarl Cephas S

## 2014-10-16 NOTE — Anesthesia Procedure Notes (Signed)
Procedure Name: Intubation Date/Time: 10/16/2014 10:55 AM Performed by: Lieutenant Diego Pre-anesthesia Checklist: Patient identified, Emergency Drugs available, Suction available and Patient being monitored Patient Re-evaluated:Patient Re-evaluated prior to inductionOxygen Delivery Method: Circle System Utilized Preoxygenation: Pre-oxygenation with 100% oxygen Intubation Type: IV induction Ventilation: Mask ventilation without difficulty Laryngoscope Size: Miller and 2 Grade View: Grade I Tube type: Oral Number of attempts: 1 Airway Equipment and Method: Stylet and Oral airway Placement Confirmation: ETT inserted through vocal cords under direct vision,  positive ETCO2 and breath sounds checked- equal and bilateral Secured at: 22 cm Tube secured with: Tape Dental Injury: Teeth and Oropharynx as per pre-operative assessment

## 2014-10-16 NOTE — Discharge Instructions (Signed)

## 2014-10-16 NOTE — Transfer of Care (Signed)
Immediate Anesthesia Transfer of Care Note  Patient: Jorge [name]  Procedure(s) Performed: Procedure(s) with comments: EXCISION CYST POSTERIOR NECK (N/A) - posterior  Patient Location: PACU  Anesthesia Type:General  Level of Consciousness: awake  Airway & Oxygen Therapy: Patient Spontanous Breathing and Patient connected to face mask oxygen  Post-op Assessment: Report given to RN and Post -op Vital signs reviewed and stable  Post vital signs: Reviewed and stable  Last Vitals:  Filed Vitals:   10/16/14 0924  BP: 155/82  Pulse: 63  Temp: 36.6 C  Resp: 18    Complications: No apparent anesthesia complications

## 2014-10-16 NOTE — Op Note (Signed)
10/16/2014  11:32 AM  PATIENT:  Jorge [name]  81 y.o. male  PRE-OPERATIVE DIAGNOSIS:  Cyst Posterior Neck  POST-OPERATIVE DIAGNOSIS:  Cyst Posterior Neck 4cm  PROCEDURE:  Procedure(s) with comments: EXCISION CYST POSTERIOR NECK (N/A) - posterior  SURGEON:  Surgeon(s) and Role:    * Jovita Kussmaul, MD - Primary  PHYSICIAN ASSISTANT:   ASSISTANTS: none   ANESTHESIA:   general  EBL:  Total I/O In: 1000 [I.V.:1000] Out: -   BLOOD ADMINISTERED:none  DRAINS: none   LOCAL MEDICATIONS USED:  MARCAINE     SPECIMEN:  Source of Specimen:  sebaceous cyst neck  DISPOSITION OF SPECIMEN:  PATHOLOGY  COUNTS:  YES  TOURNIQUET:  * No tourniquets in log *  DICTATION: .Dragon Dictation  After informed consent was obtained patient was brought to the operating room and left in the supine position on the stretcher. After adequate induction of general anesthesia the patient was moved into a prone position on the operating room table and all pressure points were padded. The posterior neck area was prepped with ChloraPrep, allowed to dry, and draped in usual sterile manner. There was a large palpable cyst along the midline of the posterior neck. The area around this cyst was infiltrated with quarter percent Marcaine with epinephrine. An elliptical incision was made around the cyst with a 15 blade knife. The incision was carried through the skin and subcutaneous tissue sharply with the electrocautery until the entire cyst was removed. The wound was irrigated with copious amounts of saline and infiltrated with more 0.25 percent Marcaine. Hemostasis was achieved using the Bovie electrocautery. The deep layer of the wound was closed with interrupted 3-0 Vicryl stitches. The skin was then closed with a running 4-0 Monocryl subcuticular stitch. Dermabond dressings were applied. The patient tolerated the procedure well. At the end of the case all needle sponge and instrument counts were correct. The  patient was then awakened and taken to recovery in stable condition.  PLAN OF CARE: Discharge to home after PACU  PATIENT DISPOSITION:  PACU - hemodynamically stable.   Delay start of Pharmacological VTE agent (>24hrs) due to surgical blood loss or risk of bleeding: not applicable

## 2014-10-17 ENCOUNTER — Encounter (HOSPITAL_BASED_OUTPATIENT_CLINIC_OR_DEPARTMENT_OTHER): Payer: Self-pay | Admitting: General Surgery

## 2014-11-14 ENCOUNTER — Other Ambulatory Visit: Payer: Self-pay | Admitting: Urology

## 2014-11-14 DIAGNOSIS — C61 Malignant neoplasm of prostate: Secondary | ICD-10-CM

## 2014-11-18 ENCOUNTER — Other Ambulatory Visit (INDEPENDENT_AMBULATORY_CARE_PROVIDER_SITE_OTHER): Payer: Medicare Other

## 2014-11-18 ENCOUNTER — Ambulatory Visit (INDEPENDENT_AMBULATORY_CARE_PROVIDER_SITE_OTHER): Payer: Medicare Other | Admitting: Internal Medicine

## 2014-11-18 ENCOUNTER — Encounter: Payer: Self-pay | Admitting: Internal Medicine

## 2014-11-18 VITALS — BP 138/72 | HR 73 | Wt 225.0 lb

## 2014-11-18 DIAGNOSIS — K2100 Gastro-esophageal reflux disease with esophagitis, without bleeding: Secondary | ICD-10-CM

## 2014-11-18 DIAGNOSIS — D485 Neoplasm of uncertain behavior of skin: Secondary | ICD-10-CM | POA: Diagnosis not present

## 2014-11-18 DIAGNOSIS — I10 Essential (primary) hypertension: Secondary | ICD-10-CM

## 2014-11-18 DIAGNOSIS — D239 Other benign neoplasm of skin, unspecified: Secondary | ICD-10-CM | POA: Diagnosis not present

## 2014-11-18 DIAGNOSIS — L570 Actinic keratosis: Secondary | ICD-10-CM

## 2014-11-18 DIAGNOSIS — E559 Vitamin D deficiency, unspecified: Secondary | ICD-10-CM

## 2014-11-18 LAB — BASIC METABOLIC PANEL
BUN: 13 mg/dL (ref 6–23)
CO2: 29 mEq/L (ref 19–32)
Calcium: 9.1 mg/dL (ref 8.4–10.5)
Chloride: 109 mEq/L (ref 96–112)
Creatinine, Ser: 0.88 mg/dL (ref 0.40–1.50)
GFR: 87.9 mL/min (ref >60.00)
Glucose, Bld: 83 mg/dL (ref 70–99)
Potassium: 4.4 mEq/L (ref 3.5–5.1)
SODIUM: 141 meq/L (ref 135–145)

## 2014-11-18 NOTE — Assessment & Plan Note (Signed)
Losartan in am; Hytrin at hs. 

## 2014-11-18 NOTE — Progress Notes (Signed)
Pre visit review using our clinic review tool, if applicable. No additional management support is needed unless otherwise documented below in the visit note. 

## 2014-11-18 NOTE — Assessment & Plan Note (Signed)
Nose - see Cryo

## 2014-11-18 NOTE — Assessment & Plan Note (Signed)
8/16 L arm inflamed Skin bx

## 2014-11-18 NOTE — Progress Notes (Signed)
Subjective:  Patient ID: Jorge [name], male    DOB: 01-10-1930  Age: 81 y.o. MRN: [id number]  CC: No chief complaint on file.   HPI Jorge [name] presents for:  F/u LBP - better  F/u R hip pai w/walking F/u HH, HTN (nl BP at home), prostate ca - s/p bx 02/25/14 - ok F/u on insomnia - worse F/u GERD and indigestion - better  C/o lesions on nose, face C/o irritated growth on L arm                      Outpatient Prescriptions Prior to Visit  Medication Sig Dispense Refill  . cholecalciferol (VITAMIN D) 1000 UNITS tablet Take 1 tablet (1,000 Units total) by mouth daily. 100 tablet 3  . Docusate Calcium (STOOL SOFTENER PO) Take by mouth daily.    [name] losartan (COZAAR) 50 MG tablet TAKE ONE TABLET BY MOUTH ONCE DAILY 90 tablet 3  . terazosin (HYTRIN) 2 MG capsule TAKE ONE CAPSULE BY MOUTH AT BEDTIME 90 capsule 3  . Diphenhydramine-APAP, sleep, (TYLENOL PM EXTRA STRENGTH PO) Take by mouth.    . oxyCODONE-acetaminophen (ROXICET) 5-325 MG per tablet Take 1-2 tablets by mouth every 4 (four) hours as needed. (Patient not taking: Reported on 11/18/2014) 50 tablet 0   No facility-administered medications prior to visit.    ROS Review of Systems  Constitutional: Positive for unexpected weight change. Negative for appetite change and fatigue.  HENT: Negative for congestion, nosebleeds, sneezing, sore throat and trouble swallowing.   Eyes: Negative for itching and visual disturbance.  Respiratory: Negative for cough.   Cardiovascular: Negative for chest pain, palpitations and leg swelling.  Gastrointestinal: Negative for nausea, diarrhea, blood in stool and abdominal distention.  Genitourinary: Negative for frequency and hematuria.  Musculoskeletal: Negative for back pain, joint swelling, gait problem and neck pain.  Skin: Negative for rash.  Neurological: Negative for dizziness, tremors, speech difficulty and weakness.  Psychiatric/Behavioral: Negative for sleep  disturbance, dysphoric mood and agitation. The patient is not nervous/anxious.     Objective:  BP 138/72 mmHg  Pulse 73  Wt 225 lb (102.059 kg)  SpO2 95%  BP Readings from Last 3 Encounters:  11/18/14 138/72  10/16/14 157/73  08/18/14 150/96    Wt Readings from Last 3 Encounters:  11/18/14 225 lb (102.059 kg)  10/16/14 225 lb (102.059 kg)  08/18/14 222 lb (100.699 kg)    Physical Exam  Constitutional: He is oriented to person, place, and time. He appears well-developed. No distress.  NAD  HENT:  Mouth/Throat: Oropharynx is clear and moist.  Eyes: Conjunctivae are normal. Pupils are equal, round, and reactive to light.  Neck: Normal range of motion. No JVD present. No thyromegaly present.  Cardiovascular: Normal rate, regular rhythm, normal heart sounds and intact distal pulses.  Exam reveals no gallop and no friction rub.   No murmur heard. Pulmonary/Chest: Effort normal and breath sounds normal. No respiratory distress. He has no wheezes. He has no rales. He exhibits no tenderness.  Abdominal: Soft. Bowel sounds are normal. He exhibits no distension and no mass. There is no tenderness. There is no rebound and no guarding.  Musculoskeletal: Normal range of motion. He exhibits no edema or tenderness.  Lymphadenopathy:    He has no cervical adenopathy.  Neurological: He is alert and oriented to person, place, and time. He has normal reflexes. No cranial nerve deficit. He exhibits normal muscle tone. He displays a negative Romberg sign. Coordination  and gait normal.  Skin: Skin is warm and dry. No rash noted.  Psychiatric: He has a normal mood and affect. His behavior is normal. Judgment and thought content normal.  6x3 mm lesion on L arm AKs on nose  Lab Results  Component Value Date   WBC 5.9 04/14/2014   HGB 15.3 10/16/2014   HCT 45.0 10/16/2014   PLT 175.0 04/14/2014   GLUCOSE 102* 10/16/2014   CHOL 167 01/10/2007   TRIG 89 01/10/2007   HDL 47.7 01/10/2007    LDLCALC 102* 01/10/2007   ALT 13 04/16/2014   AST 20 04/16/2014   NA 141 10/16/2014   K 4.2 10/16/2014   CL 109 10/16/2014   CREATININE 0.90 10/16/2014   BUN 18 10/16/2014   CO2 28 10/15/2014   TSH 1.28 04/14/2014   PSA 0.46 01/10/2007   INR 1.06 06/03/2011   HGBA1C 6.1* 01/10/2007    Procedure Note :     Procedure : Cryosurgery   Indication:   Actinic keratosis(es)   Risks including unsuccessful procedure , bleeding, infection, bruising, scar, a need for a repeat  procedure and others were explained to the patient in detail as well as the benefits. Informed consent was obtained verbally.    3 lesion(s)  on  Nose and face  was/were treated with liquid nitrogen on a Q-tip in a usual fasion . Band-Aid was applied and antibiotic ointment was given for a later use.   Tolerated well. Complications none.      Procedure Note :     Procedure :  Skin biopsy   Indication:   Suspicious lesion(s)   Risks including unsuccessful procedure , bleeding, infection, bruising, scar, a need for another complete procedure and others were explained to the patient in detail as well as the benefits. Informed consent was obtained and signed.   The patient was placed in a decubitus position.  Lesion #1 on L post arm    measuring  6x3 mm   mm   Skin over lesion #1  was prepped with Betadine and alcohol  and anesthetized with 1 cc of 2% lidocaine and epinephrine, using a 25-gauge 1 inch needle.  Shave biopsy with a sterile Dermablade was carried out in the usual fashion. Hyfrecator was used to destroy the rest of the lesion potentially left behind and for hemostasis. Band-Aid was applied with antibiotic ointment.     Tolerated well. Complications none.       Assessment & Plan:   Diagnoses and all orders for this visit:  Essential hypertension -     Basic metabolic panel; Future  Actinic keratoses  Neoplasm of uncertain behavior of skin -     Dermatology pathology  Gastroesophageal  reflux disease with esophagitis  I am having [name] maintain his Docusate Calcium (STOOL SOFTENER PO), terazosin, cholecalciferol, losartan, (Diphenhydramine-APAP, sleep, (TYLENOL PM EXTRA STRENGTH PO)), and oxyCODONE-acetaminophen.  No orders of the defined types were placed in this encounter.     Follow-up: Return in about 3 months (around 02/18/2015) for a follow-up visit.  [name], MD

## 2014-11-18 NOTE — Patient Instructions (Signed)
Postprocedure instructions :    A Band-Aid should be  changed twice daily. You can take a shower tomorrow.  Keep the wounds clean. You can wash them with liquid soap and water. Pat dry with gauze or a Kleenex tissue  Before applying antibiotic ointment and a Band-Aid.   You need to report immediately  if fever, chills or any signs of infection develop.    The biopsy results should be available in 1 -2 weeks. 

## 2014-11-18 NOTE — Assessment & Plan Note (Signed)
On Vit D 

## 2014-11-18 NOTE — Assessment & Plan Note (Signed)
Off Rx since fundoplication

## 2015-01-12 ENCOUNTER — Ambulatory Visit (HOSPITAL_COMMUNITY)
Admission: RE | Admit: 2015-01-12 | Discharge: 2015-01-12 | Disposition: A | Discharge status: Home / Self Care | Payer: Medicare Other | Source: Ambulatory Visit | Attending: Urology | Admitting: Urology

## 2015-01-12 DIAGNOSIS — K449 Diaphragmatic hernia without obstruction or gangrene: Secondary | ICD-10-CM | POA: Diagnosis not present

## 2015-01-12 DIAGNOSIS — C61 Malignant neoplasm of prostate: Secondary | ICD-10-CM | POA: Diagnosis not present

## 2015-01-12 MED ORDER — TECHNETIUM TC 99M MEDRONATE IV KIT
27.5000 | PACK | Freq: Once | INTRAVENOUS | Status: AC | PRN
  Administered 2015-01-12: 27.5 via INTRAVENOUS

## 2015-01-16 ENCOUNTER — Ambulatory Visit (INDEPENDENT_AMBULATORY_CARE_PROVIDER_SITE_OTHER): Payer: Medicare Other

## 2015-01-16 DIAGNOSIS — Z23 Encounter for immunization: Secondary | ICD-10-CM | POA: Diagnosis not present

## 2015-01-19 DIAGNOSIS — N138 Other obstructive and reflux uropathy: Secondary | ICD-10-CM | POA: Diagnosis not present

## 2015-01-19 DIAGNOSIS — C801 Malignant (primary) neoplasm, unspecified: Secondary | ICD-10-CM | POA: Diagnosis not present

## 2015-01-19 DIAGNOSIS — N401 Enlarged prostate with lower urinary tract symptoms: Secondary | ICD-10-CM | POA: Diagnosis not present

## 2015-01-19 DIAGNOSIS — N393 Stress incontinence (female) (male): Secondary | ICD-10-CM | POA: Diagnosis not present

## 2015-01-19 DIAGNOSIS — C61 Malignant neoplasm of prostate: Secondary | ICD-10-CM | POA: Diagnosis not present

## 2015-02-18 ENCOUNTER — Ambulatory Visit (INDEPENDENT_AMBULATORY_CARE_PROVIDER_SITE_OTHER): Payer: Medicare Other | Admitting: Internal Medicine

## 2015-02-18 ENCOUNTER — Encounter: Payer: Self-pay | Admitting: Internal Medicine

## 2015-02-18 VITALS — BP 148/84 | HR 73 | Wt 226.0 lb

## 2015-02-18 DIAGNOSIS — Z8546 Personal history of malignant neoplasm of prostate: Secondary | ICD-10-CM | POA: Diagnosis not present

## 2015-02-18 DIAGNOSIS — M545 Low back pain, unspecified: Secondary | ICD-10-CM

## 2015-02-18 DIAGNOSIS — D485 Neoplasm of uncertain behavior of skin: Secondary | ICD-10-CM

## 2015-02-18 DIAGNOSIS — K2100 Gastro-esophageal reflux disease with esophagitis, without bleeding: Secondary | ICD-10-CM

## 2015-02-18 DIAGNOSIS — Z23 Encounter for immunization: Secondary | ICD-10-CM

## 2015-02-18 DIAGNOSIS — I10 Essential (primary) hypertension: Secondary | ICD-10-CM

## 2015-02-18 DIAGNOSIS — E559 Vitamin D deficiency, unspecified: Secondary | ICD-10-CM

## 2015-02-18 NOTE — Assessment & Plan Note (Signed)
Chronic BP ok at home Losartan in am; Hytrin at hs

## 2015-02-18 NOTE — Assessment & Plan Note (Signed)
Chronic  Off Rx since fundoplication 0000000

## 2015-02-18 NOTE — Progress Notes (Signed)
Subjective:  Patient ID: Jorge [name], male    DOB: 1929/09/17  Age: 81 y.o. MRN: [id number]  CC: No chief complaint on file.   HPI ADRITH ACKERMAN presents for HTN, prostate ca, OA f/u  Outpatient Prescriptions Prior to Visit  Medication Sig Dispense Refill  . cholecalciferol (VITAMIN D) 1000 UNITS tablet Take 1 tablet (1,000 Units total) by mouth daily. 100 tablet 3  . Diphenhydramine-APAP, sleep, (TYLENOL PM EXTRA STRENGTH PO) Take by mouth.    [name] Calcium (STOOL SOFTENER PO) Take by mouth daily.    [name] losartan (COZAAR) 50 MG tablet TAKE ONE TABLET BY MOUTH ONCE DAILY 90 tablet 3  . terazosin (HYTRIN) 2 MG capsule TAKE ONE CAPSULE BY MOUTH AT BEDTIME 90 capsule 3  . oxyCODONE-acetaminophen (ROXICET) 5-325 MG per tablet Take 1-2 tablets by mouth every 4 (four) hours as needed. (Patient not taking: Reported on 02/18/2015) 50 tablet 0   No facility-administered medications prior to visit.    ROS Review of Systems  Constitutional: Negative for appetite change, fatigue and unexpected weight change.  HENT: Negative for congestion, nosebleeds, sneezing, sore throat and trouble swallowing.   Eyes: Negative for itching and visual disturbance.  Respiratory: Negative for cough.   Cardiovascular: Negative for chest pain, palpitations and leg swelling.  Gastrointestinal: Negative for nausea, diarrhea, blood in stool and abdominal distention.  Genitourinary: Negative for frequency and hematuria.  Musculoskeletal: Negative for back pain, joint swelling, gait problem and neck pain.  Skin: Negative for rash.  Neurological: Negative for dizziness, tremors, speech difficulty and weakness.  Psychiatric/Behavioral: Negative for sleep disturbance, dysphoric mood and agitation. The patient is not nervous/anxious.     Objective:  BP 148/84 mmHg  Pulse 73  Wt 226 lb (102.513 kg)  SpO2 96%  BP Readings from Last 3 Encounters:  02/18/15 148/84  11/18/14 138/72  10/16/14 157/73     Wt Readings from Last 3 Encounters:  02/18/15 226 lb (102.513 kg)  11/18/14 225 lb (102.059 kg)  10/16/14 225 lb (102.059 kg)    Physical Exam  Constitutional: He is oriented to person, place, and time. He appears well-developed. No distress.  NAD  HENT:  Mouth/Throat: Oropharynx is clear and moist.  Eyes: Conjunctivae are normal. Pupils are equal, round, and reactive to light.  Neck: Normal range of motion. No JVD present. No thyromegaly present.  Cardiovascular: Normal rate, regular rhythm, normal heart sounds and intact distal pulses.  Exam reveals no gallop and no friction rub.   No murmur heard. Pulmonary/Chest: Effort normal and breath sounds normal. No respiratory distress. He has no wheezes. He has no rales. He exhibits no tenderness.  Abdominal: Soft. Bowel sounds are normal. He exhibits no distension and no mass. There is no tenderness. There is no rebound and no guarding.  Musculoskeletal: Normal range of motion. He exhibits no edema or tenderness.  Lymphadenopathy:    He has no cervical adenopathy.  Neurological: He is alert and oriented to person, place, and time. He has normal reflexes. No cranial nerve deficit. He exhibits normal muscle tone. He displays a negative Romberg sign. Coordination and gait normal.  Skin: Skin is warm and dry. No rash noted.  Psychiatric: He has a normal mood and affect. His behavior is normal. Judgment and thought content normal.  Growth on nose (R) Growth on scalp  Lab Results  Component Value Date   WBC 5.9 04/14/2014   HGB 15.3 10/16/2014   HCT 45.0 10/16/2014   PLT 175.0 04/14/2014  GLUCOSE 83 11/18/2014   CHOL 167 01/10/2007   TRIG 89 01/10/2007   HDL 47.7 01/10/2007   LDLCALC 102* 01/10/2007   ALT 13 04/16/2014   AST 20 04/16/2014   NA 141 11/18/2014   K 4.4 11/18/2014   CL 109 11/18/2014   CREATININE 0.88 11/18/2014   BUN 13 11/18/2014   CO2 29 11/18/2014   TSH 1.28 04/14/2014   PSA 0.46 01/10/2007   INR 1.06  06/03/2011   HGBA1C 6.1* 01/10/2007    Nm Bone Scan Whole Body  01/12/2015  CLINICAL DATA:  Prostate cancer. EXAM: NUCLEAR MEDICINE WHOLE BODY BONE SCAN TECHNIQUE: Whole body anterior and posterior images were obtained approximately 3 hours after intravenous injection of radiopharmaceutical. RADIOPHARMACEUTICALS:  27.5 mCi Technetium-25m MDP IV COMPARISON:  None. FINDINGS: No areas of suspicious bony uptake to suggest osseous metastatic disease. Soft tissue activity is normal. IMPRESSION: No evidence of bony metastatic disease. Electronically Signed   By: Rolm Baptise M.D.   On: 01/12/2015 15:43    Assessment & Plan:   Diagnoses and all orders for this visit:  Essential hypertension -     Basic metabolic panel; Future  Gastroesophageal reflux disease with esophagitis  Bilateral low back pain without sciatica  PROSTATE CANCER, HX OF  Vitamin D deficiency  Neoplasm of uncertain behavior of skin -     Ambulatory referral to Dermatology  I have discontinued Mr. Cantey oxyCODONE-acetaminophen. I am also having him maintain his Docusate Calcium (STOOL SOFTENER PO), terazosin, cholecalciferol, losartan, and (Diphenhydramine-APAP, sleep, (TYLENOL PM EXTRA STRENGTH PO)).  No orders of the defined types were placed in this encounter.     Follow-up: Return in about 4 months (around 06/18/2015) for a follow-up visit.  [name], MD

## 2015-02-18 NOTE — Assessment & Plan Note (Signed)
Percocet prn 

## 2015-02-18 NOTE — Progress Notes (Signed)
Pre visit review using our clinic review tool, if applicable. No additional management support is needed unless otherwise documented below in the visit note. 

## 2015-02-18 NOTE — Assessment & Plan Note (Signed)
Bone scan

## 2015-02-18 NOTE — Assessment & Plan Note (Signed)
R nose - poss cancer Derm ref

## 2015-02-18 NOTE — Assessment & Plan Note (Signed)
On Vit D 

## 2015-04-29 DIAGNOSIS — Z85828 Personal history of other malignant neoplasm of skin: Secondary | ICD-10-CM | POA: Diagnosis not present

## 2015-04-29 DIAGNOSIS — L570 Actinic keratosis: Secondary | ICD-10-CM | POA: Diagnosis not present

## 2015-04-29 DIAGNOSIS — L821 Other seborrheic keratosis: Secondary | ICD-10-CM | POA: Diagnosis not present

## 2015-05-15 DIAGNOSIS — C61 Malignant neoplasm of prostate: Secondary | ICD-10-CM | POA: Diagnosis not present

## 2015-05-22 DIAGNOSIS — C801 Malignant (primary) neoplasm, unspecified: Secondary | ICD-10-CM | POA: Diagnosis not present

## 2015-05-22 DIAGNOSIS — C61 Malignant neoplasm of prostate: Secondary | ICD-10-CM | POA: Diagnosis not present

## 2015-06-16 ENCOUNTER — Ambulatory Visit (INDEPENDENT_AMBULATORY_CARE_PROVIDER_SITE_OTHER): Payer: Medicare Other | Admitting: Internal Medicine

## 2015-06-16 ENCOUNTER — Encounter: Payer: Self-pay | Admitting: Internal Medicine

## 2015-06-16 VITALS — BP 124/74 | HR 77 | Wt 227.0 lb

## 2015-06-16 DIAGNOSIS — E559 Vitamin D deficiency, unspecified: Secondary | ICD-10-CM | POA: Diagnosis not present

## 2015-06-16 DIAGNOSIS — Z23 Encounter for immunization: Secondary | ICD-10-CM | POA: Diagnosis not present

## 2015-06-16 DIAGNOSIS — K449 Diaphragmatic hernia without obstruction or gangrene: Secondary | ICD-10-CM

## 2015-06-16 DIAGNOSIS — M545 Low back pain, unspecified: Secondary | ICD-10-CM

## 2015-06-16 DIAGNOSIS — Z8546 Personal history of malignant neoplasm of prostate: Secondary | ICD-10-CM

## 2015-06-16 DIAGNOSIS — G4700 Insomnia, unspecified: Secondary | ICD-10-CM | POA: Diagnosis not present

## 2015-06-16 DIAGNOSIS — I10 Essential (primary) hypertension: Secondary | ICD-10-CM

## 2015-06-16 MED ORDER — TERAZOSIN HCL 2 MG PO CAPS: ORAL_CAPSULE | ORAL | Status: DC

## 2015-06-16 NOTE — Progress Notes (Signed)
Subjective:  Patient ID: Jorge [name], male    DOB: 1930-02-17  Age: 82 y.o. MRN: [id number]  CC: No chief complaint on file.   HPI THEODUS MCMURDIE presents for HTN, GERD and prostate Ca f/u  Outpatient Prescriptions Prior to Visit  Medication Sig Dispense Refill  . cholecalciferol (VITAMIN D) 1000 UNITS tablet Take 1 tablet (1,000 Units total) by mouth daily. 100 tablet 3  . Diphenhydramine-APAP, sleep, (TYLENOL PM EXTRA STRENGTH PO) Take by mouth.    [name] Calcium (STOOL SOFTENER PO) Take by mouth daily.    [name] losartan (COZAAR) 50 MG tablet TAKE ONE TABLET BY MOUTH ONCE DAILY 90 tablet 3  . terazosin (HYTRIN) 2 MG capsule TAKE ONE CAPSULE BY MOUTH AT BEDTIME 90 capsule 3   No facility-administered medications prior to visit.    ROS Review of Systems  Constitutional: Negative for appetite change, fatigue and unexpected weight change.  HENT: Negative for congestion, nosebleeds, sneezing, sore throat and trouble swallowing.   Eyes: Negative for itching and visual disturbance.  Respiratory: Negative for cough.   Cardiovascular: Negative for chest pain, palpitations and leg swelling.  Gastrointestinal: Negative for nausea, diarrhea, blood in stool and abdominal distention.  Genitourinary: Negative for frequency and hematuria.  Musculoskeletal: Negative for back pain, joint swelling, gait problem and neck pain.  Skin: Negative for rash.  Neurological: Negative for dizziness, tremors, speech difficulty and weakness.  Psychiatric/Behavioral: Negative for suicidal ideas, sleep disturbance, dysphoric mood and agitation. The patient is not nervous/anxious.     Objective:  BP 124/74 mmHg  Pulse 77  Wt 227 lb (102.967 kg)  SpO2 95%  BP Readings from Last 3 Encounters:  06/16/15 124/74  02/18/15 148/84  11/18/14 138/72    Wt Readings from Last 3 Encounters:  06/16/15 227 lb (102.967 kg)  02/18/15 226 lb (102.513 kg)  11/18/14 225 lb (102.059 kg)    Physical Exam    Constitutional: He is oriented to person, place, and time. He appears well-developed. No distress.  NAD  HENT:  Mouth/Throat: Oropharynx is clear and moist.  Eyes: Conjunctivae are normal. Pupils are equal, round, and reactive to light.  Neck: Normal range of motion. No JVD present. No thyromegaly present.  Cardiovascular: Normal rate, regular rhythm, normal heart sounds and intact distal pulses.  Exam reveals no gallop and no friction rub.   No murmur heard. Pulmonary/Chest: Effort normal and breath sounds normal. No respiratory distress. He has no wheezes. He has no rales. He exhibits no tenderness.  Abdominal: Soft. Bowel sounds are normal. He exhibits no distension and no mass. There is no tenderness. There is no rebound and no guarding.  Musculoskeletal: Normal range of motion. He exhibits no edema or tenderness.  Lymphadenopathy:    He has no cervical adenopathy.  Neurological: He is alert and oriented to person, place, and time. He has normal reflexes. No cranial nerve deficit. He exhibits normal muscle tone. He displays a negative Romberg sign. Coordination and gait normal.  Skin: Skin is warm and dry. No rash noted.  Psychiatric: He has a normal mood and affect. His behavior is normal. Judgment and thought content normal.    Lab Results  Component Value Date   WBC 5.9 04/14/2014   HGB 15.3 10/16/2014   HCT 45.0 10/16/2014   PLT 175.0 04/14/2014   GLUCOSE 83 11/18/2014   CHOL 167 01/10/2007   TRIG 89 01/10/2007   HDL 47.7 01/10/2007   LDLCALC 102* 01/10/2007   ALT 13 04/16/2014  AST 20 04/16/2014   NA 141 11/18/2014   K 4.4 11/18/2014   CL 109 11/18/2014   CREATININE 0.88 11/18/2014   BUN 13 11/18/2014   CO2 29 11/18/2014   TSH 1.28 04/14/2014   PSA 0.46 01/10/2007   INR 1.06 06/03/2011   HGBA1C 6.1* 01/10/2007    Nm Bone Scan Whole Body  01/12/2015  CLINICAL DATA:  Prostate cancer. EXAM: NUCLEAR MEDICINE WHOLE BODY BONE SCAN TECHNIQUE: Whole body anterior and  posterior images were obtained approximately 3 hours after intravenous injection of radiopharmaceutical. RADIOPHARMACEUTICALS:  27.5 mCi Technetium-15m MDP IV COMPARISON:  None. FINDINGS: No areas of suspicious bony uptake to suggest osseous metastatic disease. Soft tissue activity is normal. IMPRESSION: No evidence of bony metastatic disease. Electronically Signed   By: Rolm Baptise M.D.   On: 01/12/2015 15:43    Assessment & Plan:   Diagnoses and all orders for this visit:  Essential hypertension  INSOMNIA, PERSISTENT  Bilateral low back pain without sciatica  Vitamin D deficiency  PROSTATE CANCER, HX OF  HH (hiatus hernia)-post mobilization and gastroplexy March 2013 [facility]  Other orders -     terazosin (HYTRIN) 2 MG capsule; TAKE ONE CAPSULE BY MOUTH AT BEDTIME   I am having [name] maintain his Docusate Calcium (STOOL SOFTENER PO), cholecalciferol, losartan, (Diphenhydramine-APAP, sleep, (TYLENOL PM EXTRA STRENGTH PO)), and terazosin.  Meds ordered this encounter  Medications  . terazosin (HYTRIN) 2 MG capsule    Sig: TAKE ONE CAPSULE BY MOUTH AT BEDTIME    Dispense:  90 capsule    Refill:  3     Follow-up: Return in about 4 months (around 10/16/2015) for a follow-up visit.  [name], MD

## 2015-06-16 NOTE — Assessment & Plan Note (Signed)
Doing well 

## 2015-06-16 NOTE — Progress Notes (Signed)
Pre visit review using our clinic review tool, if applicable. No additional management support is needed unless otherwise documented below in the visit note. 

## 2015-06-16 NOTE — Assessment & Plan Note (Signed)
On Vit D 

## 2015-06-16 NOTE — Assessment & Plan Note (Signed)
Monitoring PSA 

## 2015-06-16 NOTE — Assessment & Plan Note (Signed)
Losartan in am; Hytrin at hs. 

## 2015-06-16 NOTE — Assessment & Plan Note (Signed)
Gastroplexy 3/13 Dr Hassell Done, s/p - doing well

## 2015-09-18 DIAGNOSIS — C61 Malignant neoplasm of prostate: Secondary | ICD-10-CM | POA: Diagnosis not present

## 2015-09-24 DIAGNOSIS — H35052 Retinal neovascularization, unspecified, left eye: Secondary | ICD-10-CM | POA: Diagnosis not present

## 2015-09-24 DIAGNOSIS — H0289 Other specified disorders of eyelid: Secondary | ICD-10-CM | POA: Diagnosis not present

## 2015-09-24 DIAGNOSIS — H43811 Vitreous degeneration, right eye: Secondary | ICD-10-CM | POA: Diagnosis not present

## 2015-09-24 DIAGNOSIS — H26493 Other secondary cataract, bilateral: Secondary | ICD-10-CM | POA: Diagnosis not present

## 2015-09-24 DIAGNOSIS — Z961 Presence of intraocular lens: Secondary | ICD-10-CM | POA: Diagnosis not present

## 2015-09-24 DIAGNOSIS — H04123 Dry eye syndrome of bilateral lacrimal glands: Secondary | ICD-10-CM | POA: Diagnosis not present

## 2015-09-24 DIAGNOSIS — I10 Essential (primary) hypertension: Secondary | ICD-10-CM | POA: Diagnosis not present

## 2015-09-25 DIAGNOSIS — E291 Testicular hypofunction: Secondary | ICD-10-CM | POA: Diagnosis not present

## 2015-09-25 DIAGNOSIS — N5201 Erectile dysfunction due to arterial insufficiency: Secondary | ICD-10-CM | POA: Diagnosis not present

## 2015-09-25 DIAGNOSIS — C61 Malignant neoplasm of prostate: Secondary | ICD-10-CM | POA: Diagnosis not present

## 2015-10-12 ENCOUNTER — Other Ambulatory Visit: Payer: Self-pay | Admitting: Internal Medicine

## 2015-10-21 ENCOUNTER — Ambulatory Visit (INDEPENDENT_AMBULATORY_CARE_PROVIDER_SITE_OTHER): Payer: Medicare Other | Admitting: Internal Medicine

## 2015-10-21 ENCOUNTER — Other Ambulatory Visit (INDEPENDENT_AMBULATORY_CARE_PROVIDER_SITE_OTHER): Payer: Medicare Other

## 2015-10-21 ENCOUNTER — Encounter: Payer: Self-pay | Admitting: Internal Medicine

## 2015-10-21 VITALS — BP 128/66 | HR 69 | Wt 215.0 lb

## 2015-10-21 DIAGNOSIS — G4700 Insomnia, unspecified: Secondary | ICD-10-CM | POA: Diagnosis not present

## 2015-10-21 DIAGNOSIS — I10 Essential (primary) hypertension: Secondary | ICD-10-CM

## 2015-10-21 DIAGNOSIS — M25522 Pain in left elbow: Secondary | ICD-10-CM | POA: Diagnosis not present

## 2015-10-21 DIAGNOSIS — L259 Unspecified contact dermatitis, unspecified cause: Secondary | ICD-10-CM

## 2015-10-21 DIAGNOSIS — M545 Low back pain, unspecified: Secondary | ICD-10-CM

## 2015-10-21 DIAGNOSIS — K449 Diaphragmatic hernia without obstruction or gangrene: Secondary | ICD-10-CM

## 2015-10-21 DIAGNOSIS — Z8546 Personal history of malignant neoplasm of prostate: Secondary | ICD-10-CM

## 2015-10-21 DIAGNOSIS — E559 Vitamin D deficiency, unspecified: Secondary | ICD-10-CM

## 2015-10-21 DIAGNOSIS — M25529 Pain in unspecified elbow: Secondary | ICD-10-CM | POA: Insufficient documentation

## 2015-10-21 DIAGNOSIS — Z23 Encounter for immunization: Secondary | ICD-10-CM

## 2015-10-21 LAB — TSH: TSH: 1.26 u[IU]/mL (ref 0.35–4.50)

## 2015-10-21 LAB — BASIC METABOLIC PANEL
BUN: 16 mg/dL (ref 6–23)
CHLORIDE: 107 meq/L (ref 96–112)
CO2: 27 mEq/L (ref 19–32)
Calcium: 9.4 mg/dL (ref 8.4–10.5)
Creatinine, Ser: 0.95 mg/dL (ref 0.40–1.50)
GFR: 80.29 mL/min (ref >60.00)
GLUCOSE: 108 mg/dL — AB (ref 70–99)
POTASSIUM: 4.5 meq/L (ref 3.5–5.1)
SODIUM: 140 meq/L (ref 135–145)

## 2015-10-21 LAB — HEPATIC FUNCTION PANEL
ALK PHOS: 90 U/L (ref 39–117)
ALT: 19 U/L (ref 0–53)
AST: 23 U/L (ref 0–37)
Albumin: 4.1 g/dL (ref 3.5–5.2)
BILIRUBIN TOTAL: 0.4 mg/dL (ref 0.2–1.2)
Bilirubin, Direct: 0.1 mg/dL (ref 0.0–0.3)
Total Protein: 6.8 g/dL (ref 6.0–8.3)

## 2015-10-21 LAB — LIPID PANEL
Cholesterol: 164 mg/dL (ref 0–200)
HDL: 52.2 mg/dL (ref >39.00)
LDL CALC: 88 mg/dL (ref 0–99)
NONHDL: 111.36
Total CHOL/HDL Ratio: 3
Triglycerides: 116 mg/dL (ref 0.0–149.0)
VLDL: 23.2 mg/dL (ref 0.0–40.0)

## 2015-10-21 MED ORDER — TRIAMCINOLONE ACETONIDE 0.5 % EX OINT: 1.0000 "application " | TOPICAL_OINTMENT | Freq: Three times a day (TID) | CUTANEOUS | Status: DC

## 2015-10-21 NOTE — Assessment & Plan Note (Signed)
Losartan in am; Hytrin at hs. 

## 2015-10-21 NOTE — Assessment & Plan Note (Signed)
L elbow lat epicond pain

## 2015-10-21 NOTE — Progress Notes (Signed)
Subjective:  Patient ID: Jorge [name], male    DOB: 1930/01/09  Age: 82 y.o. MRN: [id number]  CC: No chief complaint on file.   HPI ORVAL GATLEY presents for HTN, BPH, LBP f/u. The pt lost wt on diet C/o L inner elbow pain at times  Outpatient Prescriptions Prior to Visit  Medication Sig Dispense Refill  . Diphenhydramine-APAP, sleep, (TYLENOL PM EXTRA STRENGTH PO) Take by mouth.    [name] Calcium (STOOL SOFTENER PO) Take by mouth daily.    [name] losartan (COZAAR) 50 MG tablet TAKE ONE TABLET BY MOUTH ONCE DAILY 90 tablet 3  . terazosin (HYTRIN) 2 MG capsule TAKE ONE CAPSULE BY MOUTH AT BEDTIME 90 capsule 3   No facility-administered medications prior to visit.    ROS Review of Systems  Constitutional: Negative for appetite change, fatigue and unexpected weight change.  HENT: Negative for congestion, nosebleeds, sneezing, sore throat and trouble swallowing.   Eyes: Negative for itching and visual disturbance.  Respiratory: Negative for cough.   Cardiovascular: Negative for chest pain, palpitations and leg swelling.  Gastrointestinal: Negative for nausea, diarrhea, blood in stool and abdominal distention.  Genitourinary: Negative for frequency and hematuria.  Musculoskeletal: Positive for back pain. Negative for joint swelling, gait problem and neck pain.  Skin: Negative for rash.  Neurological: Negative for dizziness, tremors, speech difficulty and weakness.  Psychiatric/Behavioral: Negative for suicidal ideas, sleep disturbance, dysphoric mood and agitation. The patient is not nervous/anxious.     Objective:  BP 128/66 mmHg  Pulse 69  Wt 215 lb (97.523 kg)  SpO2 96%  BP Readings from Last 3 Encounters:  10/21/15 128/66  06/16/15 124/74  02/18/15 148/84    Wt Readings from Last 3 Encounters:  10/21/15 215 lb (97.523 kg)  06/16/15 227 lb (102.967 kg)  02/18/15 226 lb (102.513 kg)    Physical Exam  Constitutional: He is oriented to person, place, and  time. He appears well-developed. No distress.  NAD  HENT:  Mouth/Throat: Oropharynx is clear and moist.  Eyes: Conjunctivae are normal. Pupils are equal, round, and reactive to light.  Neck: Normal range of motion. No JVD present. No thyromegaly present.  Cardiovascular: Normal rate, regular rhythm, normal heart sounds and intact distal pulses.  Exam reveals no gallop and no friction rub.   No murmur heard. Pulmonary/Chest: Effort normal and breath sounds normal. No respiratory distress. He has no wheezes. He has no rales. He exhibits no tenderness.  Abdominal: Soft. Bowel sounds are normal. He exhibits no distension and no mass. There is no tenderness. There is no rebound and no guarding.  Musculoskeletal: Normal range of motion. He exhibits no edema or tenderness.  Lymphadenopathy:    He has no cervical adenopathy.  Neurological: He is alert and oriented to person, place, and time. He has normal reflexes. No cranial nerve deficit. He exhibits normal muscle tone. He displays a negative Romberg sign. Coordination and gait normal.  Skin: Skin is warm and dry. No rash noted.  Psychiatric: He has a normal mood and affect. His behavior is normal. Judgment and thought content normal.  small rash in the R armpit L medial epicondyl - tender  Lab Results  Component Value Date   WBC 5.9 04/14/2014   HGB 15.3 10/16/2014   HCT 45.0 10/16/2014   PLT 175.0 04/14/2014   GLUCOSE 83 11/18/2014   CHOL 167 01/10/2007   TRIG 89 01/10/2007   HDL 47.7 01/10/2007   LDLCALC 102* 01/10/2007   ALT  13 04/16/2014   AST 20 04/16/2014   NA 141 11/18/2014   K 4.4 11/18/2014   CL 109 11/18/2014   CREATININE 0.88 11/18/2014   BUN 13 11/18/2014   CO2 29 11/18/2014   TSH 1.28 04/14/2014   PSA 0.46 01/10/2007   INR 1.06 06/03/2011   HGBA1C 6.1* 01/10/2007    Nm Bone Scan Whole Body  01/12/2015  CLINICAL DATA:  Prostate cancer. EXAM: NUCLEAR MEDICINE WHOLE BODY BONE SCAN TECHNIQUE: Whole body anterior and  posterior images were obtained approximately 3 hours after intravenous injection of radiopharmaceutical. RADIOPHARMACEUTICALS:  27.5 mCi Technetium-53m MDP IV COMPARISON:  None. FINDINGS: No areas of suspicious bony uptake to suggest osseous metastatic disease. Soft tissue activity is normal. IMPRESSION: No evidence of bony metastatic disease. Electronically Signed   By: Rolm Baptise M.D.   On: 01/12/2015 15:43    Assessment & Plan:   There are no diagnoses linked to this encounter. I am having [name] maintain his Docusate Calcium (STOOL SOFTENER PO), (Diphenhydramine-APAP, sleep, (TYLENOL PM EXTRA STRENGTH PO)), terazosin, and losartan.  No orders of the defined types were placed in this encounter.     Follow-up: No Follow-up on file.  [name], MD

## 2015-10-21 NOTE — Assessment & Plan Note (Signed)
right axilla - Kenalog prn

## 2015-10-21 NOTE — Progress Notes (Signed)
Pre visit review using our clinic review tool, if applicable. No additional management support is needed unless otherwise documented below in the visit note. 

## 2015-12-15 DIAGNOSIS — H0015 Chalazion left lower eyelid: Secondary | ICD-10-CM | POA: Diagnosis not present

## 2015-12-15 DIAGNOSIS — H029 Unspecified disorder of eyelid: Secondary | ICD-10-CM | POA: Diagnosis not present

## 2015-12-28 ENCOUNTER — Other Ambulatory Visit: Payer: Self-pay | Admitting: Urology

## 2015-12-28 DIAGNOSIS — R1011 Right upper quadrant pain: Secondary | ICD-10-CM

## 2015-12-30 DIAGNOSIS — Z23 Encounter for immunization: Secondary | ICD-10-CM | POA: Diagnosis not present

## 2016-01-14 DIAGNOSIS — H43811 Vitreous degeneration, right eye: Secondary | ICD-10-CM | POA: Diagnosis not present

## 2016-01-14 DIAGNOSIS — Z961 Presence of intraocular lens: Secondary | ICD-10-CM | POA: Diagnosis not present

## 2016-01-14 DIAGNOSIS — H35052 Retinal neovascularization, unspecified, left eye: Secondary | ICD-10-CM | POA: Diagnosis not present

## 2016-01-14 DIAGNOSIS — H0015 Chalazion left lower eyelid: Secondary | ICD-10-CM | POA: Diagnosis not present

## 2016-01-14 DIAGNOSIS — H26493 Other secondary cataract, bilateral: Secondary | ICD-10-CM | POA: Diagnosis not present

## 2016-01-14 DIAGNOSIS — H04123 Dry eye syndrome of bilateral lacrimal glands: Secondary | ICD-10-CM | POA: Diagnosis not present

## 2016-01-14 DIAGNOSIS — H0289 Other specified disorders of eyelid: Secondary | ICD-10-CM | POA: Diagnosis not present

## 2016-01-22 ENCOUNTER — Encounter (HOSPITAL_COMMUNITY)
Admission: RE | Admit: 2016-01-22 | Discharge: 2016-01-22 | Disposition: A | Discharge status: Home / Self Care | Payer: Medicare Other | Source: Ambulatory Visit | Attending: Urology | Admitting: Urology

## 2016-01-22 DIAGNOSIS — M545 Low back pain: Secondary | ICD-10-CM | POA: Diagnosis not present

## 2016-01-22 DIAGNOSIS — R1011 Right upper quadrant pain: Secondary | ICD-10-CM | POA: Insufficient documentation

## 2016-01-22 MED ORDER — TECHNETIUM TC 99M MEDRONATE IV KIT
20.1000 | PACK | Freq: Once | INTRAVENOUS | Status: AC | PRN
  Administered 2016-01-22: 20.1 via INTRAVENOUS

## 2016-01-29 DIAGNOSIS — N5201 Erectile dysfunction due to arterial insufficiency: Secondary | ICD-10-CM | POA: Diagnosis not present

## 2016-01-29 DIAGNOSIS — C799 Secondary malignant neoplasm of unspecified site: Secondary | ICD-10-CM | POA: Diagnosis not present

## 2016-01-29 DIAGNOSIS — C61 Malignant neoplasm of prostate: Secondary | ICD-10-CM | POA: Diagnosis not present

## 2016-01-29 DIAGNOSIS — Z5111 Encounter for antineoplastic chemotherapy: Secondary | ICD-10-CM | POA: Diagnosis not present

## 2016-01-29 DIAGNOSIS — N401 Enlarged prostate with lower urinary tract symptoms: Secondary | ICD-10-CM | POA: Diagnosis not present

## 2016-02-17 ENCOUNTER — Encounter: Payer: Self-pay | Admitting: Internal Medicine

## 2016-02-17 ENCOUNTER — Ambulatory Visit (INDEPENDENT_AMBULATORY_CARE_PROVIDER_SITE_OTHER): Payer: Medicare Other | Admitting: Internal Medicine

## 2016-02-17 DIAGNOSIS — I10 Essential (primary) hypertension: Secondary | ICD-10-CM

## 2016-02-17 DIAGNOSIS — L239 Allergic contact dermatitis, unspecified cause: Secondary | ICD-10-CM

## 2016-02-17 DIAGNOSIS — C61 Malignant neoplasm of prostate: Secondary | ICD-10-CM

## 2016-02-17 DIAGNOSIS — F411 Generalized anxiety disorder: Secondary | ICD-10-CM | POA: Diagnosis not present

## 2016-02-17 DIAGNOSIS — L570 Actinic keratosis: Secondary | ICD-10-CM

## 2016-02-17 DIAGNOSIS — E559 Vitamin D deficiency, unspecified: Secondary | ICD-10-CM

## 2016-02-17 NOTE — Assessment & Plan Note (Signed)
Losartan, Hytrin 

## 2016-02-17 NOTE — Assessment & Plan Note (Signed)
Dr Diona Fanti On Luopron, s/p XRT

## 2016-02-17 NOTE — Assessment & Plan Note (Signed)
Kenalog prn

## 2016-02-17 NOTE — Assessment & Plan Note (Signed)
Doing OK 

## 2016-02-17 NOTE — Assessment & Plan Note (Signed)
On Vit D 

## 2016-02-17 NOTE — Progress Notes (Signed)
Pre visit review using our clinic review tool, if applicable. No additional management support is needed unless otherwise documented below in the visit note. 

## 2016-02-17 NOTE — Progress Notes (Signed)
Subjective:  Patient ID: Jorge [name], male    DOB: 01/20/1930  Age: 82 y.o. MRN: [id number]  CC: No chief complaint on file.   HPI READ SCHARR presents for prostate cancer, HTN, elevated PSA f/u. On Lupron.  Outpatient Medications Prior to Visit  Medication Sig Dispense Refill  . Diphenhydramine-APAP, sleep, (TYLENOL PM EXTRA STRENGTH PO) Take by mouth.    [name] Calcium (STOOL SOFTENER PO) Take by mouth daily.    [name] losartan (COZAAR) 50 MG tablet TAKE ONE TABLET BY MOUTH ONCE DAILY 90 tablet 3  . terazosin (HYTRIN) 2 MG capsule TAKE ONE CAPSULE BY MOUTH AT BEDTIME 90 capsule 3  . triamcinolone ointment (KENALOG) 0.5 % Apply 1 application topically 3 (three) times daily. 15 g 1   No facility-administered medications prior to visit.     ROS Review of Systems  Constitutional: Negative for appetite change, fatigue and unexpected weight change.  HENT: Negative for congestion, nosebleeds, sneezing, sore throat and trouble swallowing.   Eyes: Negative for itching and visual disturbance.  Respiratory: Negative for cough.   Cardiovascular: Negative for chest pain, palpitations and leg swelling.  Gastrointestinal: Negative for abdominal distention, blood in stool, diarrhea and nausea.  Genitourinary: Negative for frequency and hematuria.  Musculoskeletal: Negative for back pain, gait problem, joint swelling and neck pain.  Skin: Negative for rash.  Neurological: Negative for dizziness, tremors, speech difficulty and weakness.  Psychiatric/Behavioral: Negative for agitation, dysphoric mood and sleep disturbance. The patient is not nervous/anxious.     Objective:  BP 140/80   Pulse 68   Wt 215 lb (97.5 kg)   SpO2 96%   BMI 28.37 kg/m   BP Readings from Last 3 Encounters:  02/17/16 140/80  10/21/15 128/66  06/16/15 124/74    Wt Readings from Last 3 Encounters:  02/17/16 215 lb (97.5 kg)  10/21/15 215 lb (97.5 kg)  06/16/15 227 lb (103 kg)    Physical Exam    Constitutional: He is oriented to person, place, and time. He appears well-developed. No distress.  NAD  HENT:  Mouth/Throat: Oropharynx is clear and moist.  Eyes: Conjunctivae are normal. Pupils are equal, round, and reactive to light.  Neck: Normal range of motion. No JVD present. No thyromegaly present.  Cardiovascular: Normal rate, regular rhythm, normal heart sounds and intact distal pulses.  Exam reveals no gallop and no friction rub.   No murmur heard. Pulmonary/Chest: Effort normal and breath sounds normal. No respiratory distress. He has no wheezes. He has no rales. He exhibits no tenderness.  Abdominal: Soft. Bowel sounds are normal. He exhibits no distension and no mass. There is no tenderness. There is no rebound and no guarding.  Musculoskeletal: Normal range of motion. He exhibits no edema or tenderness.  Lymphadenopathy:    He has no cervical adenopathy.  Neurological: He is alert and oriented to person, place, and time. He has normal reflexes. No cranial nerve deficit. He exhibits normal muscle tone. He displays a negative Romberg sign. Coordination and gait normal.  Skin: Skin is warm and dry. No rash noted.  Psychiatric: He has a normal mood and affect. His behavior is normal. Judgment and thought content normal.  AKs face   Procedure Note :     Procedure : Cryosurgery   Indication:  Actinic keratosis(es) x7   Risks including unsuccessful procedure , bleeding, infection, bruising, scar, a need for a repeat  procedure and others were explained to the patient in detail as well as  the benefits. Informed consent was obtained verbally.   7  lesion(s)  on face was/were treated with liquid nitrogen on a Q-tip in a usual fasion . Band-Aid was applied and antibiotic ointment was given for a later use.   Tolerated well. Complications none.   Postprocedure instructions :     Keep the wounds clean. You can wash them with liquid soap and water. Pat dry with gauze or a Kleenex  tissue  Before applying antibiotic ointment and a Band-Aid.   You need to report immediately  if  any signs of infection develop.     Lab Results  Component Value Date   WBC 5.9 04/14/2014   HGB 15.3 10/16/2014   HCT 45.0 10/16/2014   PLT 175.0 04/14/2014   GLUCOSE 108 (H) 10/21/2015   CHOL 164 10/21/2015   TRIG 116.0 10/21/2015   HDL 52.20 10/21/2015   LDLCALC 88 10/21/2015   ALT 19 10/21/2015   AST 23 10/21/2015   NA 140 10/21/2015   K 4.5 10/21/2015   CL 107 10/21/2015   CREATININE 0.95 10/21/2015   BUN 16 10/21/2015   CO2 27 10/21/2015   TSH 1.26 10/21/2015   PSA 0.46 01/10/2007   INR 1.06 06/03/2011   HGBA1C 6.1 (H) 01/10/2007    Nm Bone Scan Whole Body  Result Date: 01/22/2016 CLINICAL DATA:  History of prostate cancer. Right hip and lower back pain. EXAM: NUCLEAR MEDICINE WHOLE BODY BONE SCAN TECHNIQUE: Whole body anterior and posterior images were obtained approximately 3 hours after intravenous injection of radiopharmaceutical. RADIOPHARMACEUTICALS:  20.1 mCi Technetium-60m MDP IV COMPARISON:  Bone scan of January 12, 2015. Radiographs of May 21, 2012. FINDINGS: Focus of abnormal uptake is noted in the right shoulder consistent with degenerative change. There is interval development of new foci of abnormal uptake to the left of the L3-4 and L4-5 disc spaces posteriorly that may represent degenerative change. No other areas of abnormal uptake are noted. IMPRESSION: New foci of abnormal uptake are seen along the left side of the lower lumbar spine which may represent degenerative change or osteophyte formation, as previous lumbar radiographs do demonstrate some degenerative change in the lumbar spine. Repeat lumbar radiographs may be performed for further evaluation to exclude metastatic disease, as well as correlation with PSA level. Electronically Signed   By: Marijo Conception, M.D.   On: 01/22/2016 14:26    Assessment & Plan:   There are no diagnoses linked to  this encounter. I am having [name] maintain his Docusate Calcium (STOOL SOFTENER PO), (Diphenhydramine-APAP, sleep, (TYLENOL PM EXTRA STRENGTH PO)), terazosin, losartan, and triamcinolone ointment.  No orders of the defined types were placed in this encounter.    Follow-up: No Follow-up on file.  [name], MD

## 2016-02-17 NOTE — Patient Instructions (Signed)
   Postprocedure instructions :     Keep the wounds clean. You can wash them with liquid soap and water. Pat dry with gauze or a Kleenex tissue  Before applying antibiotic ointment and a Band-Aid.   You need to report immediately  if  any signs of infection develop.    

## 2016-03-16 ENCOUNTER — Encounter: Payer: Self-pay | Admitting: Oncology

## 2016-03-16 ENCOUNTER — Telehealth: Payer: Self-pay | Admitting: Oncology

## 2016-03-16 NOTE — Telephone Encounter (Signed)
Pt confirmed appt; verified demo and insurance, mail pt letter, fax referring provider appt. Date/time.

## 2016-03-25 ENCOUNTER — Telehealth: Payer: Self-pay | Admitting: Oncology

## 2016-03-25 ENCOUNTER — Ambulatory Visit (HOSPITAL_BASED_OUTPATIENT_CLINIC_OR_DEPARTMENT_OTHER): Payer: Medicare Other | Admitting: Oncology

## 2016-03-25 ENCOUNTER — Encounter: Payer: Self-pay | Admitting: Medical Oncology

## 2016-03-25 VITALS — BP 138/62 | HR 78 | Temp 97.9°F | Resp 18 | Wt 179.0 lb

## 2016-03-25 DIAGNOSIS — C772 Secondary and unspecified malignant neoplasm of intra-abdominal lymph nodes: Secondary | ICD-10-CM | POA: Diagnosis not present

## 2016-03-25 DIAGNOSIS — C775 Secondary and unspecified malignant neoplasm of intrapelvic lymph nodes: Secondary | ICD-10-CM

## 2016-03-25 DIAGNOSIS — C61 Malignant neoplasm of prostate: Secondary | ICD-10-CM | POA: Diagnosis not present

## 2016-03-25 DIAGNOSIS — E291 Testicular hypofunction: Secondary | ICD-10-CM

## 2016-03-25 NOTE — Telephone Encounter (Signed)
Patient given two bottles of contrast and instructions for CT scan.

## 2016-03-25 NOTE — Progress Notes (Signed)
Reason for Referral: Prostate cancer.   HPI: 82 year old gentleman currently of [place] where he lived the majority of his life. He is a pleasant gentleman in reasonably good health without any significant comorbid conditions. He was diagnosed with prostate cancer in 2008. At that time he had a Gleason score 4+3 = 8. He was treated initially with observation and subsequently received definitive radiation therapy in 2011. His radiation therapy concluded in March 2011. He subsequently received 1 year of androgen deprivation with last injection given July 2011. His PSA was undetectable after the conclusion of therapy. His PSA started to rise on October 2016 with a PSA of up to 4.57. His staging workup at that time showed periaortic and obturator lymph node enlargement consistent with metastatic disease. He was started on androgen deprivation at that time under the care of Dr. Diona Fanti and has been receiving it at that time. His PSA did respond with a drop to 1.9 on 09/18/2015. His PSA on 01/29/2016 was 3.0. His bone scan obtained on 01/22/2016 showed no clear-cut evidence of metastatic disease  Clinically, he is completely asymptomatic from his cancer. He denied any back pain or pathological fractures. He does not report any hematuria or dysuria. He denied any appetite changes or weight loss. He continues to have excellent quality of life. He lives independently and attends activities of daily living.  He does not report any headaches, blurry vision, syncope or seizures. He does not report any fevers, chills, sweats or weight loss. He does not report any chest pain, palpitation, orthopnea or leg edema. He does not report any cough, wheezing or hemoptysis. He isn't reporting nausea, vomiting or abdominal pain. He does not report any frequency urgency or hesitancy. He does not report any skeletal complaints. Remaining review of systems unremarkable.   Past Medical History:  Diagnosis Date  . Anxiety   .  ED (erectile dysfunction)   . Gastric ulcer    Dr. Ardis Hughs  . GERD (gastroesophageal reflux disease)   . Hernia, hiatal   . History of colonic polyps   . Hypertension   . Insomnia   . Prostate cancer [facility]) 2010   Dr. Luberta Robertson  . Syncope 2011   from Bicalutamide  . Vitamin D deficiency   :  Past Surgical History:  Procedure Laterality Date  . APPENDECTOMY     age 60  . CYST REMOVAL NECK N/A 10/16/2014   Procedure: EXCISION CYST POSTERIOR NECK;  Surgeon: Autumn Messing III, MD;  Location: [place];  Service: General;  Laterality: N/A;  posterior  . ESOPHAGOGASTRODUODENOSCOPY  06/05/2011   Procedure: ESOPHAGOGASTRODUODENOSCOPY (EGD);  Surgeon: Gatha Mayer, MD;  Location: [facility] ENDOSCOPY;  Service: Endoscopy;  Laterality: N/A;  . HERNIA REPAIR  06/10/2011      . HIATAL HERNIA REPAIR  06/10/2011   Procedure: LAPAROSCOPIC REPAIR OF HIATAL HERNIA;  Surgeon: Pedro Earls, MD;  Location: [place];  Service: General;  Laterality: N/A;  Laparoscopic repair of paraesophageal hernia.  . TRANSURETHRAL RESECTION OF PROSTATE    :   Current Outpatient Prescriptions:  .  Diphenhydramine-APAP, sleep, (TYLENOL PM EXTRA STRENGTH PO), Take by mouth., Disp: , Rfl:  .  Docusate Calcium (STOOL SOFTENER PO), Take by mouth daily., Disp: , Rfl:  .  losartan (COZAAR) 50 MG tablet, TAKE ONE TABLET BY MOUTH ONCE DAILY, Disp: 90 tablet, Rfl: 3 .  terazosin (HYTRIN) 2 MG capsule, TAKE ONE CAPSULE BY MOUTH AT BEDTIME, Disp: 90 capsule, Rfl: 3 .  triamcinolone ointment (  KENALOG) 0.5 %, Apply 1 application topically 3 (three) times daily., Disp: 15 g, Rfl: 1:  Allergies  Allergen Reactions  . Aspirin Other (See Comments)    Ulcerated stomach  . Azithromycin Shortness Of Breath  . Cefuroxime Axetil Shortness Of Breath  . Iodine Other (See Comments)    Per pt tremors and dyspnea  . Naproxen Other (See Comments)    REACTION: upset stomach - like with other NSAIDs  :  Family History  Problem  Relation Age of Onset  . Pancreatic cancer Father   . Hypertension Father   . Cancer Father   . Hypertension Other   :  Social History   Social History  . Marital status: Legally Separated    Spouse name: N/A  . Number of children: N/A  . Years of education: N/A   Occupational History  . retired Retired   Social History Main Topics  . Smoking status: Former Research scientist (life sciences)  . Smokeless tobacco: Not on file  . Alcohol use No     Comment: social  . Drug use: No  . Sexual activity: Yes   Other Topics Concern  . Not on file   Social History Narrative   Regular exercise- yes/golf  :  Pertinent items are noted in HPI.  Exam: Blood pressure 138/62, pulse 78, temperature 97.9 F (36.6 C), temperature source Oral, resp. rate 18, weight 179 lb (81.2 kg), SpO2 99 %.  ECOG 0 General appearance: alert and cooperative well-appearing without distress. Head: Normocephalic, without obvious abnormality Throat: lips, mucosa, and tongue normal; teeth and gums normal no oral thrush noted.  Neck: no adenopathy Resp: clear to auscultation bilaterally no wheezing or dullness to percussion. Chest wall: no tenderness Cardio: regular rate and rhythm, S1, S2 normal, no murmur, click, rub or gallop GI: soft, non-tender; bowel sounds normal; no masses,  no organomegaly Extremities: extremities normal, atraumatic, no cyanosis or edema Skin: Skin color, texture, turgor normal. No rashes or lesions Lymph nodes: Cervical, supraclavicular, and axillary nodes normal.  CBC    Component Value Date/Time   WBC 5.9 04/14/2014 1137   RBC 4.53 04/14/2014 1137   HGB 15.3 10/16/2014 0958   HCT 45.0 10/16/2014 0958   PLT 175.0 04/14/2014 1137   MCV 96.3 04/14/2014 1137   MCH 31.5 06/12/2011 0538   MCHC 32.7 04/14/2014 1137   RDW 13.7 04/14/2014 1137   LYMPHSABS 1.2 04/14/2014 1137   MONOABS 0.5 04/14/2014 1137   EOSABS 0.1 04/14/2014 1137   BASOSABS 0.0 04/14/2014 1137      Chemistry      Component  Value Date/Time   NA 140 10/21/2015 1102   K 4.5 10/21/2015 1102   CL 107 10/21/2015 1102   CO2 27 10/21/2015 1102   BUN 16 10/21/2015 1102   CREATININE 0.95 10/21/2015 1102      Component Value Date/Time   CALCIUM 9.4 10/21/2015 1102   ALKPHOS 90 10/21/2015 1102   AST 23 10/21/2015 1102   ALT 19 10/21/2015 1102   BILITOT 0.4 10/21/2015 1102       Assessment and Plan:   82 year old gentleman with the following issues:  1. Prostate cancer diagnosed in 2008. It is Gleason score 4+3 = 7 with prostate cancer involvement both lobes. He was treated with definitive radiation therapy and androgen deprivation concluded in July 2011. He developed metastatic disease in October 2016 with CT scan showed large periaortic and obturator lymph nodes consistent with metastatic disease. He was restarted on androgen deprivation with a  PSA initially down to 1. And his PSA in 01/29/2016 was 3.0.  The natural course of advanced prostate cancer was discussed with the patient today. 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 His most recent bone scan did not show any measurable disease. From a management standpoint, we have discussed different treatment modalities. He understands that castration resistant disease with lymph node involvement is an incurable disease state although he is a good candidate for multiple therapies. These options would include Provenge immunotherapy, second line hormone therapy with Nicki Reaper and systemic chemotherapy.  Given the small rise in his PSA and the fact that he is completely asymptomatic, I  have recommended a short period of observation and repeat his PSA in 4-6 weeks. I also recommend obtaining CT scan of the abdomen and pelvis to complete his staging workup. Depending on these findings, we will determine the next course of action. He would be a good candidate for Zytiga or Xtandi at that time if his PSA continues to rise.  All his questions were answered  today to his satisfaction.  2. Androgen depravation: I have recommended that to continue indefinitely. His, receiving under the care of Dr. Diona Fanti.  3. Bone directed therapy: He has no evidence of advanced metastasis to the bone. Bone directed therapy will be an option if that happens in the future.  4. Follow-up: Will be in early February to discuss his repeat PSA and CT scan.

## 2016-03-25 NOTE — Telephone Encounter (Signed)
Appointments scheduled per 12/22 LOS. Patient given AVS report and calendars with future scheduled appointments. °

## 2016-03-25 NOTE — Progress Notes (Signed)
Introduced myself to [name] and my role as navigator. I gave him my business card and asked him to call me with any questions or concerns. He voiced understanding. I will continue to follow.

## 2016-04-07 ENCOUNTER — Other Ambulatory Visit: Payer: Self-pay | Admitting: *Deleted

## 2016-04-07 DIAGNOSIS — C61 Malignant neoplasm of prostate: Secondary | ICD-10-CM

## 2016-04-11 ENCOUNTER — Telehealth: Payer: Self-pay | Admitting: Medical Oncology

## 2016-04-11 NOTE — Progress Notes (Signed)
Jorge [name] called stating that he has 2 bottles of contrast. He is aware  he will not get  IV contrast due to his iodine allergy but should he drink the bottles of contrast he was given. I do not know the answer but will call CT and call him back. He voiced understanding.

## 2016-04-11 NOTE — Progress Notes (Signed)
Mr. Massar called asking if iodine is on his allergy list. He states he reviewed information for CT scan and it states if you have an  iodine allergy to notify your physician. I did inform him iodine is on his list and  Dr. Alen Blew will need to reorder his scan without IV contrast. Dixie Dr. Hazeline Junker nurse notified.

## 2016-05-03 ENCOUNTER — Other Ambulatory Visit (HOSPITAL_BASED_OUTPATIENT_CLINIC_OR_DEPARTMENT_OTHER): Payer: Medicare Other

## 2016-05-03 ENCOUNTER — Ambulatory Visit (HOSPITAL_COMMUNITY)
Admission: RE | Admit: 2016-05-03 | Discharge: 2016-05-03 | Disposition: A | Discharge status: Home / Self Care | Payer: Medicare Other | Source: Ambulatory Visit | Attending: Oncology | Admitting: Oncology

## 2016-05-03 DIAGNOSIS — K449 Diaphragmatic hernia without obstruction or gangrene: Secondary | ICD-10-CM | POA: Diagnosis not present

## 2016-05-03 DIAGNOSIS — C61 Malignant neoplasm of prostate: Secondary | ICD-10-CM | POA: Diagnosis not present

## 2016-05-03 LAB — COMPREHENSIVE METABOLIC PANEL
ALK PHOS: 107 U/L (ref 40–150)
ALT: 17 U/L (ref 0–55)
ANION GAP: 8 meq/L (ref 3–11)
AST: 22 U/L (ref 5–34)
Albumin: 4 g/dL (ref 3.5–5.0)
BILIRUBIN TOTAL: 0.51 mg/dL (ref 0.20–1.20)
BUN: 13.4 mg/dL (ref 7.0–26.0)
CALCIUM: 9.7 mg/dL (ref 8.4–10.4)
CHLORIDE: 108 meq/L (ref 98–109)
CO2: 24 mEq/L (ref 22–29)
Creatinine: 1 mg/dL (ref 0.7–1.3)
EGFR: 72 mL/min/{1.73_m2} — ABNORMAL LOW (ref >90)
Glucose: 108 mg/dl (ref 70–140)
Potassium: 5 mEq/L (ref 3.5–5.1)
Sodium: 140 mEq/L (ref 136–145)
TOTAL PROTEIN: 7.1 g/dL (ref 6.4–8.3)

## 2016-05-03 LAB — CBC WITH DIFFERENTIAL/PLATELET
BASO%: 0.5 % (ref 0.0–2.0)
Basophils Absolute: 0 10*3/uL (ref 0.0–0.1)
EOS%: 0.8 % (ref 0.0–7.0)
Eosinophils Absolute: 0.1 10*3/uL (ref 0.0–0.5)
HEMATOCRIT: 43.6 % (ref 38.4–49.9)
HGB: 14.6 g/dL (ref 13.0–17.1)
LYMPH#: 1.1 10*3/uL (ref 0.9–3.3)
LYMPH%: 16.7 % (ref 14.0–49.0)
MCH: 32.4 pg (ref 27.2–33.4)
MCHC: 33.5 g/dL (ref 32.0–36.0)
MCV: 96.8 fL (ref 79.3–98.0)
MONO#: 0.5 10*3/uL (ref 0.1–0.9)
MONO%: 7.5 % (ref 0.0–14.0)
NEUT#: 4.8 10*3/uL (ref 1.5–6.5)
NEUT%: 74.5 % (ref 39.0–75.0)
PLATELETS: 184 10*3/uL (ref 140–400)
RBC: 4.5 10*6/uL (ref 4.20–5.82)
RDW: 13.7 % (ref 11.0–14.6)
WBC: 6.4 10*3/uL (ref 4.0–10.3)

## 2016-05-04 LAB — PSA: PROSTATE SPECIFIC AG, SERUM: 6.2 ng/mL — AB (ref 0.0–4.0)

## 2016-05-05 ENCOUNTER — Telehealth: Payer: Self-pay | Admitting: Oncology

## 2016-05-05 ENCOUNTER — Ambulatory Visit (HOSPITAL_BASED_OUTPATIENT_CLINIC_OR_DEPARTMENT_OTHER): Payer: Medicare Other | Admitting: Oncology

## 2016-05-05 VITALS — BP 211/93 | HR 75 | Temp 97.0°F | Resp 18 | Wt 223.6 lb

## 2016-05-05 DIAGNOSIS — C61 Malignant neoplasm of prostate: Secondary | ICD-10-CM

## 2016-05-05 NOTE — Progress Notes (Signed)
Hematology and Oncology Follow Up Visit  Jorge [name] [id number] 1929/12/05 83 y.o. 05/05/2016 2:59 PM Jorge [name], MDPlotnikov, Jorge Lacks, MD   Principle Diagnosis: 83 year old gentleman with prostate cancer diagnosed in 2008. He had a Gleason score 4+3 = 8. 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 No measurable disease noted.  Prior Therapy: He was treated initially with observation and subsequently received definitive radiation therapy in 2011. His radiation therapy concluded in March 2011.  He subsequently received 1 year of androgen deprivation with last injection given July 2011. His PSA was undetectable after the conclusion of therapy.  His PSA started to rise on October 2016 with a PSA of up to 4.57. His staging workup at that time showed periaortic and obturator lymph node enlargement consistent with metastatic disease. He was started on androgen deprivation at that time under the care of Dr. Diona [name] and has been receiving it at that time.  His PSA did respond with a drop to 1.9 on 09/18/2015. His PSA on 01/29/2016 was 3.0. His bone scan obtained on 01/22/2016 showed no clear-cut evidence of metastatic disease.  CT scan in January 2018 showed no evidence of measurable disease.   Current therapy: Androgen deprivation only. Under consideration to start second line hormone therapy.  Interim History: Jorge [name] presents today for a follow-up visit. Since the last visit, he reports no major changes in his health. He continues to be reasonably active and performs activities of daily living. He denied any recent hospitalization or illnesses. He denied any changes in his urinary status including hematuria or dysuria. He does not report any arthralgias or myalgias. His quality of life is not changed.  He does not report any headaches, blurry vision, syncope or seizures. He does not report any fevers, chills, sweats or weight loss. He does not report any chest pain,  palpitation, orthopnea or leg edema. He does not report any cough, wheezing or hemoptysis. He isn't reporting nausea, vomiting or abdominal pain. He does not report any frequency urgency or hesitancy. He does not report any skeletal complaints. Remaining review of systems unremarkable.   Medications: I have reviewed the patient's current medications.  Current Outpatient Prescriptions  Medication Sig Dispense Refill  . Diphenhydramine-APAP, sleep, (TYLENOL PM EXTRA STRENGTH PO) Take by mouth.    Jorge [name] Calcium (STOOL SOFTENER PO) Take by mouth daily.    Jorge [name] Kitchen losartan (COZAAR) 50 MG tablet TAKE ONE TABLET BY MOUTH ONCE DAILY 90 tablet 3  . terazosin (HYTRIN) 2 MG capsule TAKE ONE CAPSULE BY MOUTH AT BEDTIME 90 capsule 3  . triamcinolone ointment (KENALOG) 0.5 % Apply 1 application topically 3 (three) times daily. 15 g 1   No current facility-administered medications for this visit.      Allergies:  Allergies  Allergen Reactions  . Aspirin Other (See Comments)    Ulcerated stomach  . Azithromycin Shortness Of Breath  . Cefuroxime Axetil Shortness Of Breath  . Iodine Other (See Comments)    Per pt tremors and dyspnea  . Naproxen Other (See Comments)    REACTION: upset stomach - like with other NSAIDs    Past Medical History, Surgical history, Social history, and Family History were reviewed and updated.  Physical Exam: Blood pressure (!) 211/93, pulse 75, temperature 97 F (36.1 C), temperature source Oral, resp. rate 18, SpO2 96 %. ECOG: 0 General appearance: alert and cooperative Head: Normocephalic, without obvious abnormality Neck: no adenopathy Lymph nodes: Cervical, supraclavicular, and axillary nodes normal. Heart:regular rate and  rhythm, S1, S2 normal, no murmur, click, rub or gallop Lung:chest clear, no wheezing, rales, normal symmetric air entry, Heart exam - S1, S2 normal, no murmur, no gallop, rate regular Abdomin: soft, non-tender, without masses or  organomegaly EXT:no erythema, induration, or nodules   Lab Results: Lab Results  Component Value Date   WBC 6.4 05/03/2016   HGB 14.6 05/03/2016   HCT 43.6 05/03/2016   MCV 96.8 05/03/2016   PLT 184 05/03/2016     Chemistry      Component Value Date/Time   NA 140 05/03/2016 1035   K 5.0 05/03/2016 1035   CL 107 10/21/2015 1102   CO2 24 05/03/2016 1035   BUN 13.4 05/03/2016 1035   CREATININE 1.0 05/03/2016 1035      Component Value Date/Time   CALCIUM 9.7 05/03/2016 1035   ALKPHOS 107 05/03/2016 1035   AST 22 05/03/2016 1035   ALT 17 05/03/2016 1035   BILITOT 0.51 05/03/2016 1035      Impression and Plan:  83 year old gentleman with the following issues:   1. Prostate cancer diagnosed in 2008. It is Gleason score 4+3 = 7 with prostate cancer involvement both lobes. He was treated with definitive radiation therapy and androgen deprivation concluded in July 2011. He developed metastatic disease in October 2016 with CT scan showed large periaortic and obturator lymph nodes consistent with metastatic disease. He was restarted on androgen deprivation with a PSA initially down to 1. PSA in October 2017 was 3.0.   His PSA on 05/03/2016 was 6.2 and CT scan obtained at the time of the abdomen and pelvis did not show any evidence of measurable disease.  Options of therapy were reviewed today including second line hormonal therapy with Jorge [name] versus systemic chemotherapy. Also the option of observation and surveillance was also contemplated given his age and no measurable disease.  After discussion today, we have opted to have a period of observation and start systemic therapy 3 develops symptomatic progression.  2. Androgen depravation: I have recommended that to continue indefinitely. His, receiving under the care of Dr. Diona [name].  3. Bone directed therapy: He has no evidence of advanced metastasis to the bone. Bone directed therapy will be an option if that happens in  the future.  4. Follow-up: Will be in 3 months.   Jorge Button, MD 2/1/20182:59 PM

## 2016-05-05 NOTE — Telephone Encounter (Signed)
Gave patient avs report and appointments for May  °

## 2016-06-15 ENCOUNTER — Ambulatory Visit (INDEPENDENT_AMBULATORY_CARE_PROVIDER_SITE_OTHER): Payer: Medicare Other | Admitting: Internal Medicine

## 2016-06-15 ENCOUNTER — Encounter: Payer: Self-pay | Admitting: Internal Medicine

## 2016-06-15 DIAGNOSIS — I10 Essential (primary) hypertension: Secondary | ICD-10-CM | POA: Diagnosis not present

## 2016-06-15 DIAGNOSIS — C61 Malignant neoplasm of prostate: Secondary | ICD-10-CM

## 2016-06-15 DIAGNOSIS — E559 Vitamin D deficiency, unspecified: Secondary | ICD-10-CM | POA: Diagnosis not present

## 2016-06-15 DIAGNOSIS — K2100 Gastro-esophageal reflux disease with esophagitis, without bleeding: Secondary | ICD-10-CM

## 2016-06-15 DIAGNOSIS — K449 Diaphragmatic hernia without obstruction or gangrene: Secondary | ICD-10-CM | POA: Diagnosis not present

## 2016-06-15 NOTE — Assessment & Plan Note (Signed)
BP nl at home Losartan, Hytrin

## 2016-06-15 NOTE — Progress Notes (Signed)
Subjective:  Patient ID: Jorge [name], male    DOB: 12-10-1929  Age: 83 y.o. MRN: [id number]  CC: Follow-up (HTN, GERD, PSA, )   HPI Jorge [name] presents for HTN, HH, BPH f/u. BP is low at home  Outpatient Medications Prior to Visit  Medication Sig Dispense Refill  . Diphenhydramine-APAP, sleep, (TYLENOL PM EXTRA STRENGTH PO) Take by mouth.    [name] Calcium (STOOL SOFTENER PO) Take by mouth daily.    [name] losartan (COZAAR) 50 MG tablet TAKE ONE TABLET BY MOUTH ONCE DAILY 90 tablet 3  . terazosin (HYTRIN) 2 MG capsule TAKE ONE CAPSULE BY MOUTH AT BEDTIME 90 capsule 3  . triamcinolone ointment (KENALOG) 0.5 % Apply 1 application topically 3 (three) times daily. 15 g 1   No facility-administered medications prior to visit.     ROS Review of Systems  Constitutional: Negative for appetite change, fatigue and unexpected weight change.  HENT: Negative for congestion, nosebleeds, sneezing, sore throat and trouble swallowing.   Eyes: Negative for itching and visual disturbance.  Respiratory: Negative for cough.   Cardiovascular: Negative for chest pain, palpitations and leg swelling.  Gastrointestinal: Negative for abdominal distention, blood in stool, diarrhea and nausea.  Genitourinary: Positive for frequency. Negative for hematuria.  Musculoskeletal: Negative for back pain, gait problem, joint swelling and neck pain.  Skin: Negative for rash.  Neurological: Negative for dizziness, tremors, speech difficulty and weakness.  Psychiatric/Behavioral: Positive for decreased concentration. Negative for agitation, dysphoric mood and sleep disturbance. The patient is not nervous/anxious.     Objective:  BP (!) 166/82   Pulse 70   Temp 97.6 F (36.4 C) (Oral)   Resp 16   Ht 6\' 1"  (1.854 m)   Wt 220 lb 8 oz (100 kg)   SpO2 97%   BMI 29.09 kg/m   BP Readings from Last 3 Encounters:  06/15/16 (!) 166/82  05/05/16 (!) 211/93  03/25/16 138/62    Wt Readings from Last 3  Encounters:  06/15/16 220 lb 8 oz (100 kg)  05/05/16 223 lb 9 oz (101.4 kg)  03/25/16 179 lb (81.2 kg)    Physical Exam  Constitutional: He is oriented to person, place, and time. He appears well-developed. No distress.  NAD  HENT:  Mouth/Throat: Oropharynx is clear and moist.  Eyes: Conjunctivae are normal. Pupils are equal, round, and reactive to light.  Neck: Normal range of motion. No JVD present. No thyromegaly present.  Cardiovascular: Normal rate, regular rhythm, normal heart sounds and intact distal pulses.  Exam reveals no gallop and no friction rub.   No murmur heard. Pulmonary/Chest: Effort normal and breath sounds normal. No respiratory distress. He has no wheezes. He has no rales. He exhibits no tenderness.  Abdominal: Soft. Bowel sounds are normal. He exhibits no distension and no mass. There is no tenderness. There is no rebound and no guarding.  Musculoskeletal: Normal range of motion. He exhibits no edema or tenderness.  Lymphadenopathy:    He has no cervical adenopathy.  Neurological: He is alert and oriented to person, place, and time. He has normal reflexes. No cranial nerve deficit. He exhibits normal muscle tone. He displays a negative Romberg sign. Coordination and gait normal.  Skin: Skin is warm and dry. No rash noted.  Psychiatric: He has a normal mood and affect. His behavior is normal. Judgment and thought content normal.    Lab Results  Component Value Date   WBC 6.4 05/03/2016   HGB 14.6 05/03/2016  HCT 43.6 05/03/2016   PLT 184 05/03/2016   GLUCOSE 108 05/03/2016   CHOL 164 10/21/2015   TRIG 116.0 10/21/2015   HDL 52.20 10/21/2015   LDLCALC 88 10/21/2015   ALT 17 05/03/2016   AST 22 05/03/2016   NA 140 05/03/2016   K 5.0 05/03/2016   CL 107 10/21/2015   CREATININE 1.0 05/03/2016   BUN 13.4 05/03/2016   CO2 24 05/03/2016   TSH 1.26 10/21/2015   PSA 0.46 01/10/2007   INR 1.06 06/03/2011   HGBA1C 6.1 (H) 01/10/2007    Ct Abdomen Pelvis Wo  Contrast  Result Date: 05/03/2016 CLINICAL DATA:  Prostate cancer in 2011.  Prostatectomy 2008. EXAM: CT ABDOMEN AND PELVIS WITHOUT CONTRAST TECHNIQUE: Multidetector CT imaging of the abdomen and pelvis was performed following the standard protocol without IV contrast. COMPARISON:  None. FINDINGS: Lower chest: Lung bases are clear. Hepatobiliary: No focal hepatic lesion. No biliary duct dilatation. Gallbladder is normal. Common bile duct is normal. Pancreas: Pancreas is normal. No ductal dilatation. No pancreatic inflammation. Spleen: Post splenectomy. Adrenals/urinary tract: Adrenal glands and kidneys are normal. The ureters and bladder normal. Stomach/Bowel: Large diaphragmatic hernia on LEFT. Stomach extends the through the hernia with a large amount of stomach herniating through the diaphragm). The entirety of the stomach is contained within the diaphragmatic hernia. No evidence of bowel obstruction. Contrast flows into the transverse colon. Vascular/Lymphatic: Abdominal aorta is normal caliber with atherosclerotic calcification. There is no retroperitoneal or periportal lymphadenopathy. No pelvic lymphadenopathy. Reproductive: Post prostatectomy. No pelvic lymphadenopathy. This mass lesion of Other: No free fluid. Musculoskeletal: No aggressive osseous lesion IMPRESSION: 1. No evidence of prostate cancer metastasis in the abdomen or pelvis. 2. Large diaphragmatic hernia within the entirety of the stomach within the herniated fat extending into the LEFT hemithorax. Electronically Signed   By: Suzy Bouchard M.D.   On: 05/03/2016 15:50    Assessment & Plan:   There are no diagnoses linked to this encounter. I am having [name] maintain his Docusate Calcium (STOOL SOFTENER PO), (Diphenhydramine-APAP, sleep, (TYLENOL PM EXTRA STRENGTH PO)), terazosin, losartan, and triamcinolone ointment.  No orders of the defined types were placed in this encounter.    Follow-up: No Follow-up on file.  [name], MD

## 2016-06-15 NOTE — Assessment & Plan Note (Signed)
On Vit D 

## 2016-06-15 NOTE — Assessment & Plan Note (Signed)
Doing well 

## 2016-06-15 NOTE — Assessment & Plan Note (Signed)
Off rx 

## 2016-06-15 NOTE — Progress Notes (Signed)
Pre-visit discussion using our clinic review tool. No additional management support is needed unless otherwise documented below in the visit note.  

## 2016-06-15 NOTE — Assessment & Plan Note (Signed)
On Lupron 

## 2016-08-16 ENCOUNTER — Other Ambulatory Visit (HOSPITAL_BASED_OUTPATIENT_CLINIC_OR_DEPARTMENT_OTHER): Payer: Medicare Other

## 2016-08-16 DIAGNOSIS — C61 Malignant neoplasm of prostate: Secondary | ICD-10-CM | POA: Diagnosis not present

## 2016-08-16 LAB — COMPREHENSIVE METABOLIC PANEL
ALBUMIN: 3.6 g/dL (ref 3.5–5.0)
ALT: 16 U/L (ref 0–55)
AST: 21 U/L (ref 5–34)
Alkaline Phosphatase: 110 U/L (ref 40–150)
Anion Gap: 7 mEq/L (ref 3–11)
BILIRUBIN TOTAL: 0.39 mg/dL (ref 0.20–1.20)
BUN: 13 mg/dL (ref 7.0–26.0)
CALCIUM: 9.4 mg/dL (ref 8.4–10.4)
CHLORIDE: 108 meq/L (ref 98–109)
CO2: 25 meq/L (ref 22–29)
CREATININE: 1 mg/dL (ref 0.7–1.3)
EGFR: 71 mL/min/{1.73_m2} — ABNORMAL LOW (ref >90)
GLUCOSE: 107 mg/dL (ref 70–140)
Potassium: 5 mEq/L (ref 3.5–5.1)
Sodium: 141 mEq/L (ref 136–145)
TOTAL PROTEIN: 6.6 g/dL (ref 6.4–8.3)

## 2016-08-16 LAB — CBC WITH DIFFERENTIAL/PLATELET
BASO%: 0.4 % (ref 0.0–2.0)
BASOS ABS: 0 10*3/uL (ref 0.0–0.1)
EOS%: 1.6 % (ref 0.0–7.0)
Eosinophils Absolute: 0.1 10*3/uL (ref 0.0–0.5)
HEMATOCRIT: 43.3 % (ref 38.4–49.9)
HEMOGLOBIN: 14.3 g/dL (ref 13.0–17.1)
LYMPH#: 1.5 10*3/uL (ref 0.9–3.3)
LYMPH%: 21.1 % (ref 14.0–49.0)
MCH: 32 pg (ref 27.2–33.4)
MCHC: 33.1 g/dL (ref 32.0–36.0)
MCV: 96.5 fL (ref 79.3–98.0)
MONO#: 0.7 10*3/uL (ref 0.1–0.9)
MONO%: 9 % (ref 0.0–14.0)
NEUT#: 5 10*3/uL (ref 1.5–6.5)
NEUT%: 67.9 % (ref 39.0–75.0)
Platelets: 225 10*3/uL (ref 140–400)
RBC: 4.48 10*6/uL (ref 4.20–5.82)
RDW: 13.5 % (ref 11.0–14.6)
WBC: 7.3 10*3/uL (ref 4.0–10.3)

## 2016-08-17 ENCOUNTER — Ambulatory Visit (HOSPITAL_BASED_OUTPATIENT_CLINIC_OR_DEPARTMENT_OTHER): Payer: Medicare Other | Admitting: Oncology

## 2016-08-17 ENCOUNTER — Telehealth: Payer: Self-pay | Admitting: Oncology

## 2016-08-17 VITALS — BP 156/82 | HR 78 | Temp 98.4°F | Resp 18 | Ht 73.0 in | Wt 223.9 lb

## 2016-08-17 DIAGNOSIS — C61 Malignant neoplasm of prostate: Secondary | ICD-10-CM | POA: Diagnosis not present

## 2016-08-17 DIAGNOSIS — E291 Testicular hypofunction: Secondary | ICD-10-CM

## 2016-08-17 DIAGNOSIS — C778 Secondary and unspecified malignant neoplasm of lymph nodes of multiple regions: Secondary | ICD-10-CM | POA: Diagnosis not present

## 2016-08-17 LAB — PSA: Prostate Specific Ag, Serum: 10.3 ng/mL — ABNORMAL HIGH (ref 0.0–4.0)

## 2016-08-17 NOTE — Progress Notes (Signed)
Hematology and Oncology Follow Up Visit  Jorge [name] [id number] 1929-12-10 83 y.o. 08/17/2016 1:13 PM Jorge [name], MDPlotnikov, [name], MD   Principle Diagnosis: 83 year old gentleman with prostate cancer diagnosed in 2008. He had a Gleason score 4+3 = 8. 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 No measurable disease noted.  Prior Therapy: He was treated initially with observation and subsequently received definitive radiation therapy in 2011. His radiation therapy concluded in March 2011.  He subsequently received 1 year of androgen deprivation with last injection given July 2011. His PSA was undetectable after the conclusion of therapy.  His PSA started to rise on October 2016 with a PSA of up to 4.57. His staging workup at that time showed periaortic and obturator lymph node enlargement consistent with metastatic disease. He was started on androgen deprivation at that time under the care of Dr. Diona Fanti and has been receiving it at that time.  His PSA did respond with a drop to 1.9 on 09/18/2015. His PSA on 01/29/2016 was 3.0. His bone scan obtained on 01/22/2016 showed no clear-cut evidence of metastatic disease.  CT scan in January 2018 showed no evidence of measurable disease.   Current therapy: Androgen deprivation only. Under consideration to start second line hormone therapy.  Interim History: Jorge [name] presents today for a follow-up visit. Since the last visit, he continues to be in excellent health and shape without any complaints. He continues to play golf without any [name] and his ability to do so. He continues to be active and performs activities of daily living. He denied any recent hospitalization or illnesses. He denied any changes in his urinary status including hematuria or dysuria. He does not report any arthralgias or myalgias. He does not report any bone pain or weight loss.  He does not report any headaches, blurry vision, syncope or  seizures. He does not report any fevers, chills, sweats or weight loss. He does not report any chest pain, palpitation, orthopnea or leg edema. He does not report any cough, wheezing or hemoptysis. He isn't reporting nausea, vomiting or abdominal pain. He does not report any frequency urgency or hesitancy. He does not report any skeletal complaints. Remaining review of systems unremarkable.   Medications: I have reviewed the patient's current medications.  Current Outpatient Prescriptions  Medication Sig Dispense Refill  . Diphenhydramine-APAP, sleep, (TYLENOL PM EXTRA STRENGTH PO) Take by mouth.    [name] Calcium (STOOL SOFTENER PO) Take by mouth daily.    [name] losartan (COZAAR) 50 MG tablet TAKE ONE TABLET BY MOUTH ONCE DAILY 90 tablet 3  . triamcinolone ointment (KENALOG) 0.5 % Apply 1 application topically 3 (three) times daily. 15 g 1  . terazosin (HYTRIN) 2 MG capsule TAKE ONE CAPSULE BY MOUTH AT BEDTIME (Patient not taking: Reported on 08/17/2016) 90 capsule 3   No current facility-administered medications for this visit.      Allergies:  Allergies  Allergen Reactions  . Aspirin Other (See Comments)    Ulcerated stomach  . Azithromycin Shortness Of Breath  . Cefuroxime Axetil Shortness Of Breath  . Iodine Other (See Comments)    Per pt tremors and dyspnea  . Naproxen Other (See Comments)    REACTION: upset stomach - like with other NSAIDs    Past Medical History, Surgical history, Social history, and Family History were reviewed and updated.  Physical Exam: Blood pressure (!) 156/82, pulse 78, temperature 98.4 F (36.9 C), temperature source Oral, resp. rate 18, height 6'  1" (1.854 m), weight 223 lb 14.4 oz (101.6 kg), SpO2 97 %. ECOG: 0 General appearance: alert and cooperative appeared without distress. Head: Normocephalic, without obvious abnormality no oral ulcers or lesions. Neck: no adenopathy Lymph nodes: Cervical, supraclavicular, and axillary nodes  normal. Heart:regular rate and rhythm, S1, S2 normal, no murmur, click, rub or gallop Lung:chest clear, no wheezing, rales, normal symmetric air entry,  Abdomin: soft, non-tender, without masses or organomegaly no shifting dullness or ascites. EXT:no erythema, induration, or nodules   Lab Results: Lab Results  Component Value Date   WBC 7.3 08/16/2016   HGB 14.3 08/16/2016   HCT 43.3 08/16/2016   MCV 96.5 08/16/2016   PLT 225 08/16/2016     Chemistry      Component Value Date/Time   NA 141 08/16/2016 1323   K 5.0 08/16/2016 1323   CL 107 10/21/2015 1102   CO2 25 08/16/2016 1323   BUN 13.0 08/16/2016 1323   CREATININE 1.0 08/16/2016 1323      Component Value Date/Time   CALCIUM 9.4 08/16/2016 1323   ALKPHOS 110 08/16/2016 1323   AST 21 08/16/2016 1323   ALT 16 08/16/2016 1323   BILITOT 0.39 08/16/2016 1323     Results for Jorge [name], Jorge [name] (MRN [id number]) as of 08/17/2016 12:58  Ref. Range 05/03/2016 10:35 08/16/2016 13:23  PSA Latest Ref Range: 0.0 - 4.0 ng/mL 6.2 (H) 10.3 (H)    Impression and Plan:  83 year old gentleman with the following issues:   1. Prostate cancer diagnosed in 2008. It is Gleason score 4+3 = 7 with prostate cancer involvement both lobes. He was treated with definitive radiation therapy and androgen deprivation concluded in July 2011. He developed metastatic disease in October 2016 with CT scan showed large periaortic and obturator lymph nodes consistent with metastatic disease. He was restarted on androgen deprivation with a PSA initially down to 1. PSA in October 2017 was 3.0.   His PSA on 05/03/2016 was 6.2 and CT scan obtained at the time of the abdomen and pelvis did not show any evidence of measurable disease.  Repeat PSA in 08/16/2016 showed a rise to 10.3.  Risks and benefits of treating his biochemical relapse and castration resistant disease was discussed. Treatment with second line hormonal therapy in the form of Xtandi or Apalutamide  was discussed patient on recent data to support use of disease agents in this setting. His improvement and metastatic free survival as well as overall survival. Complications associated with these medication were reviewed today including fatigue, tiredness, edema among others.  For the time being, he opted to continue with observation and surveillance and initiate therapy if his PSA starts to rise rapidly or develops measurable disease.   2. Androgen depravation: I have recommended that to continue indefinitely. His, receiving under the care of Dr. Diona Fanti.  3. Bone directed therapy: He has no evidence of advanced metastasis to the bone. Bone directed therapy will be an option if that happens in the future.  4. Follow-up: Will be in 4 months.   Zola Button, MD 5/16/20181:13 PM

## 2016-08-17 NOTE — Telephone Encounter (Signed)
Gave patient AVS and calender per 5/16 los.  

## 2016-09-02 DIAGNOSIS — C61 Malignant neoplasm of prostate: Secondary | ICD-10-CM | POA: Diagnosis not present

## 2016-09-13 DIAGNOSIS — Z5111 Encounter for antineoplastic chemotherapy: Secondary | ICD-10-CM | POA: Diagnosis not present

## 2016-09-13 DIAGNOSIS — Z192 Hormone resistant malignancy status: Secondary | ICD-10-CM | POA: Diagnosis not present

## 2016-09-13 DIAGNOSIS — C61 Malignant neoplasm of prostate: Secondary | ICD-10-CM | POA: Diagnosis not present

## 2016-09-23 DIAGNOSIS — H26493 Other secondary cataract, bilateral: Secondary | ICD-10-CM | POA: Diagnosis not present

## 2016-09-23 DIAGNOSIS — H0289 Other specified disorders of eyelid: Secondary | ICD-10-CM | POA: Diagnosis not present

## 2016-09-23 DIAGNOSIS — H35052 Retinal neovascularization, unspecified, left eye: Secondary | ICD-10-CM | POA: Diagnosis not present

## 2016-09-23 DIAGNOSIS — C44319 Basal cell carcinoma of skin of other parts of face: Secondary | ICD-10-CM | POA: Diagnosis not present

## 2016-10-11 ENCOUNTER — Ambulatory Visit (INDEPENDENT_AMBULATORY_CARE_PROVIDER_SITE_OTHER): Payer: Medicare Other | Admitting: *Deleted

## 2016-10-11 VITALS — BP 132/87 | HR 94 | Resp 20 | Ht 72.0 in | Wt 221.0 lb

## 2016-10-11 DIAGNOSIS — Z0000 Encounter for general adult medical examination without abnormal findings: Secondary | ICD-10-CM

## 2016-10-11 NOTE — Patient Instructions (Addendum)
[employer]  Continue doing brain stimulating activities (puzzles, reading, adult coloring books, staying active) to keep memory sharp.   Continue to eat heart healthy diet (full of fruits, vegetables, whole grains, lean protein, water--limit salt, fat, and sugar intake) and increase physical activity as tolerated. Try to eat small frequent meals and increase water intake.   Jorge [name] , Thank you for taking time to come for your Medicare Wellness Visit. I appreciate your ongoing commitment to your health goals. Please review the following plan we discussed and let me know if I can assist you in the future.   These are the goals we discussed: Goals    . Maintain current health status          Continue to be as active as possible and eat healthy. Eat small frequent healthy meals.       This is a list of the screening recommended for you and due dates:  Health Maintenance  Topic Date Due  . Flu Shot  11/02/2016  . Tetanus Vaccine  06/15/2025  . Pneumonia vaccines  Completed

## 2016-10-11 NOTE — Progress Notes (Signed)
Pre visit review using our clinic review tool, if applicable. No additional management support is needed unless otherwise documented below in the visit note. 

## 2016-10-11 NOTE — Progress Notes (Addendum)
Subjective:   Jorge [name] is a 83 y.o. male who presents for Medicare Annual/Subsequent preventive examination.  Review of Systems:  No ROS.  Medicare Wellness Visit. Additional risk factors are reflected in the social history.   Cardiac Risk Factors include: advanced age (>53men, >43 women);hypertension;male gender Sleep patterns: feels rested on waking, gets up 1 times nightly to void and sleeps 6-7 hours nightly.   Home Safety/Smoke Alarms: Feels safe in home. Smoke alarms in place.  Living environment; residence and Firearm Safety: 2-story house, no firearms. Lives alone, no needs for DME, good support system  Seat Belt Safety/Bike Helmet: Wears seat belt.   Counseling:   Eye Exam- appointment yearly Dental- appointment every 6 months  Male:   CCS- N/A PSA-  Lab Results  Component Value Date   PSA 0.46 01/10/2007   PSA (H) 09/18/2006    8.22 (NOTE) Result repeated and verified. Test Methodology: [employer] PSA       Objective:    Vitals: BP 132/87   Pulse 94   Resp 20   Ht 6' (1.829 m)   Wt 221 lb (100.2 kg)   SpO2 94%   BMI 29.97 kg/m   Body mass index is 29.97 kg/m.  Tobacco History  Smoking Status  . Former Smoker  Smokeless Tobacco  . Never Used     Counseling given: Not Answered   Past Medical History:  Diagnosis Date  . Anxiety   . ED (erectile dysfunction)   . Gastric ulcer    Dr. Ardis Hughs  . GERD (gastroesophageal reflux disease)   . Hernia, hiatal   . History of colonic polyps   . Hypertension   . Insomnia   . Prostate cancer [facility]) 2010   Dr. Luberta Robertson  . Syncope 2011   from Bicalutamide  . Vitamin D deficiency    Past Surgical History:  Procedure Laterality Date  . APPENDECTOMY     age 89  . CYST REMOVAL NECK N/A 10/16/2014   Procedure: EXCISION CYST POSTERIOR NECK;  Surgeon: Autumn Messing III, MD;  Location: [place];  Service: General;  Laterality: N/A;  posterior  . ESOPHAGOGASTRODUODENOSCOPY  06/05/2011   Procedure: ESOPHAGOGASTRODUODENOSCOPY (EGD);  Surgeon: Gatha Mayer, MD;  Location[facility] ENDOSCOPY;  Service: Endoscopy;  Laterality: N/A;  . HERNIA REPAIR  06/10/2011      . HIATAL HERNIA REPAIR  06/10/2011   Procedure: LAPAROSCOPIC REPAIR OF HIATAL HERNIA;  Surgeon: Pedro Earls, MD;  Location: [place];  Service: General;  Laterality: N/A;  Laparoscopic repair of paraesophageal hernia.  . TRANSURETHRAL RESECTION OF PROSTATE     Family History  Problem Relation Age of Onset  . Pancreatic cancer Father   . Hypertension Father   . Cancer Father   . Hypertension Other    History  Sexual Activity  . Sexual activity: Yes    Outpatient Encounter Prescriptions as of 10/11/2016  Medication Sig  . acetaminophen (TYLENOL) 500 MG tablet Take 500 mg by mouth.  . Diphenhydramine-APAP, sleep, (TYLENOL PM EXTRA STRENGTH PO) Take by mouth.  [name] Calcium (STOOL SOFTENER PO) Take by mouth daily.  [name] Leuprolide Acetate (LUPRON IJ) Inject 0.2 mLs into the skin every 4 (four) months.  [name] losartan (COZAAR) 50 MG tablet TAKE ONE TABLET BY MOUTH ONCE DAILY  . terazosin (HYTRIN) 2 MG capsule TAKE ONE CAPSULE BY MOUTH AT BEDTIME  . triamcinolone ointment (KENALOG) 0.5 % Apply 1 application topically 3 (three) times daily.   No facility-administered  encounter medications on file as of 10/11/2016.     Activities of Daily Living In your present state of health, do you have any difficulty performing the following activities: 10/11/2016  Hearing? N  Vision? N  Difficulty concentrating or making decisions? N  Walking or climbing stairs? N  Dressing or bathing? N  Doing errands, shopping? N  Preparing Food and eating ? N  Using the Toilet? N  In the past six months, have you accidently leaked urine? N  Do you have problems with loss of bowel control? N  Managing your Medications? N  Managing your Finances? N  Housekeeping or managing your Housekeeping? N  Some recent data might be hidden    Patient  Care Team: [name], [name], MD as PCP - General Dahlstedt, Annie Main, MD (Urology) Gatha Mayer, MD (Gastroenterology)   Assessment:    Physical assessment deferred to PCP.  Exercise Activities and Dietary recommendations Current Exercise Habits: Home exercise routine, Type of exercise: Other - see comments;walking (plays golf), Time (Minutes): 60, Frequency (Times/Week): 4, Weekly Exercise (Minutes/Week): 240, Intensity: Mild, Exercise limited by: None identified  Diet (meal preparation, eat out, water intake, caffeinated beverages, dairy products, fruits and vegetables): in general, a "healthy" diet  , low salt, reports that he eats very little for breakfast and lunch. Drinks 2-3 glasses of water daily.   Reviewed heart healthy diet, encouraged patient to increase daily water intake. Discussed eating small frequent meals rather than skipping meals. Diet education was provided via handout.   Goals    . Maintain current health status          Continue to be as active as possible and eat healthy. Eat small frequent healthy meals.      Fall Risk Fall Risk  10/11/2016 10/21/2015 08/18/2014  Falls in the past year? No No Yes  Number falls in past yr: - - 1  Injury with Fall? - - Yes   Depression Screen PHQ 2/9 Scores 10/11/2016 10/21/2015 08/18/2014  PHQ - 2 Score 0 0 1    Cognitive Function MMSE - Mini Mental State Exam 10/11/2016  Orientation to time 5  Orientation to Place 5  Registration 3  Attention/ Calculation 5  Recall 2  Language- name 2 objects 2  Language- repeat 1  Language- follow 3 step command 3  Language- read & follow direction 1  Write a sentence 1  Copy design 1  Total score 29        Immunization History  Administered Date(s) Administered  . H1N1 03/11/2008  . Influenza Split 01/11/2011, 01/12/2012  . Influenza Whole 01/21/2008, 01/26/2009, 01/07/2010  . Influenza,inj,Quad PF,36+ Mos 12/06/2012, 01/09/2014, 01/16/2015  . Influenza-Unspecified  12/30/2015  . Pneumococcal Conjugate-13 04/14/2014  . Pneumococcal Polysaccharide-23 02/18/2008, 02/18/2015  . Td 12/17/2003  . Tdap 06/16/2015   Screening Tests Health Maintenance  Topic Date Due  . INFLUENZA VACCINE  11/02/2016  . TETANUS/TDAP  06/15/2025  . PNA vac Low Risk Adult  Completed      Plan:    Continue doing brain stimulating activities (puzzles, reading, adult coloring books, staying active) to keep memory sharp.   Continue to eat heart healthy diet (full of fruits, vegetables, whole grains, lean protein, water--limit salt, fat, and sugar intake) and increase physical activity as tolerated. Try to eat small frequent meals and increase water intake.   I have personally reviewed and noted the following in the patient's chart:   . Medical and social history . Use of alcohol,  tobacco or illicit drugs  . Current medications and supplements . Functional ability and status . Nutritional status . Physical activity . Advanced directives . List of other physicians . Vitals . Screenings to include cognitive, depression, and falls . Referrals and appointments  In addition, I have reviewed and discussed with patient certain preventive protocols, quality metrics, and best practice recommendations. A written personalized care plan for preventive services as well as general preventive health recommendations were provided to patient.     Michiel Cowboy, RN  10/11/2016  Medical screening examination/treatment/procedure(s) were performed by non-physician practitioner and as supervising physician I was immediately available for consultation/collaboration. I agree with above. [name], MD

## 2016-10-18 ENCOUNTER — Ambulatory Visit (INDEPENDENT_AMBULATORY_CARE_PROVIDER_SITE_OTHER): Payer: Medicare Other | Admitting: Internal Medicine

## 2016-10-18 ENCOUNTER — Encounter: Payer: Self-pay | Admitting: Internal Medicine

## 2016-10-18 DIAGNOSIS — I10 Essential (primary) hypertension: Secondary | ICD-10-CM | POA: Diagnosis not present

## 2016-10-18 DIAGNOSIS — D485 Neoplasm of uncertain behavior of skin: Secondary | ICD-10-CM | POA: Diagnosis not present

## 2016-10-18 MED ORDER — LOSARTAN POTASSIUM 50 MG PO TABS: 50.0000 mg | ORAL_TABLET | Freq: Every day | ORAL | 3 refills | Status: DC

## 2016-10-18 MED ORDER — ZOSTER VAC RECOMB ADJUVANTED 50 MCG/0.5ML IM SUSR: 0.5000 mL | Freq: Once | INTRAMUSCULAR | 1 refills | Status: AC

## 2016-10-18 NOTE — Progress Notes (Signed)
Subjective:  Patient ID: Jorge [name], male    DOB: April 18, 1929  Age: 83 y.o. MRN: [id number]  CC: No chief complaint on file.   HPI Jorge [name] presents for R cheek lesion   Outpatient Medications Prior to Visit  Medication Sig Dispense Refill  . acetaminophen (TYLENOL) 500 MG tablet Take 500 mg by mouth.    . Diphenhydramine-APAP, sleep, (TYLENOL PM EXTRA STRENGTH PO) Take by mouth.    [name] Calcium (STOOL SOFTENER PO) Take by mouth daily.    [name] Leuprolide Acetate (LUPRON IJ) Inject 0.2 mLs into the skin every 4 (four) months.    [name] losartan (COZAAR) 50 MG tablet TAKE ONE TABLET BY MOUTH ONCE DAILY 90 tablet 3  . triamcinolone ointment (KENALOG) 0.5 % Apply 1 application topically 3 (three) times daily. 15 g 1  . terazosin (HYTRIN) 2 MG capsule TAKE ONE CAPSULE BY MOUTH AT BEDTIME 90 capsule 3   No facility-administered medications prior to visit.     ROS Review of Systems  Skin: Positive for color change, rash and wound.    Objective:  BP 136/84 (BP Location: Left Arm, Patient Position: Sitting, Cuff Size: Normal)   Pulse 65   Temp 97.7 F (36.5 C) (Oral)   Ht 6' (1.829 m)   Wt 221 lb (100.2 kg)   SpO2 98%   BMI 29.97 kg/m   BP Readings from Last 3 Encounters:  10/18/16 136/84  10/11/16 132/87  08/17/16 (!) 156/82    Wt Readings from Last 3 Encounters:  10/18/16 221 lb (100.2 kg)  10/11/16 221 lb (100.2 kg)  08/17/16 223 lb 14.4 oz (101.6 kg)    Physical Exam  Constitutional: He is oriented to person, place, and time. He appears well-developed. No distress.  NAD  HENT:  Mouth/Throat: Oropharynx is clear and moist.  Eyes: Pupils are equal, round, and reactive to light. Conjunctivae are normal.  Neck: Normal range of motion. No JVD present. No thyromegaly present.  Cardiovascular: Normal rate, regular rhythm, normal heart sounds and intact distal pulses.  Exam reveals no gallop and no friction rub.   No murmur heard. Pulmonary/Chest: Effort  normal and breath sounds normal. No respiratory distress. He has no wheezes. He has no rales. He exhibits no tenderness.  Abdominal: Soft. Bowel sounds are normal. He exhibits no distension and no mass. There is no tenderness. There is no rebound and no guarding.  Musculoskeletal: Normal range of motion. He exhibits no edema or tenderness.  Lymphadenopathy:    He has no cervical adenopathy.  Neurological: He is alert and oriented to person, place, and time. He has normal reflexes. No cranial nerve deficit. He exhibits normal muscle tone. He displays a negative Romberg sign. Coordination and gait normal.  Skin: Skin is warm and dry. No rash noted.  Psychiatric: He has a normal mood and affect. His behavior is normal. Judgment and thought content normal.      R cheek lesion, scalp lesion, nose lesions on the R   Lab Results  Component Value Date   WBC 7.3 08/16/2016   HGB 14.3 08/16/2016   HCT 43.3 08/16/2016   PLT 225 08/16/2016   GLUCOSE 107 08/16/2016   CHOL 164 10/21/2015   TRIG 116.0 10/21/2015   HDL 52.20 10/21/2015   LDLCALC 88 10/21/2015   ALT 16 08/16/2016   AST 21 08/16/2016   NA 141 08/16/2016   K 5.0 08/16/2016   CL 107 10/21/2015   CREATININE 1.0 08/16/2016  BUN 13.0 08/16/2016   CO2 25 08/16/2016   TSH 1.26 10/21/2015   PSA 0.46 01/10/2007   INR 1.06 06/03/2011   HGBA1C 6.1 (H) 01/10/2007    Ct Abdomen Pelvis Wo Contrast  Result Date: 05/03/2016 CLINICAL DATA:  Prostate cancer in 2011.  Prostatectomy 2008. EXAM: CT ABDOMEN AND PELVIS WITHOUT CONTRAST TECHNIQUE: Multidetector CT imaging of the abdomen and pelvis was performed following the standard protocol without IV contrast. COMPARISON:  None. FINDINGS: Lower chest: Lung bases are clear. Hepatobiliary: No focal hepatic lesion. No biliary duct dilatation. Gallbladder is normal. Common bile duct is normal. Pancreas: Pancreas is normal. No ductal dilatation. No pancreatic inflammation. Spleen: Post splenectomy.  Adrenals/urinary tract: Adrenal glands and kidneys are normal. The ureters and bladder normal. Stomach/Bowel: Large diaphragmatic hernia on LEFT. Stomach extends the through the hernia with a large amount of stomach herniating through the diaphragm). The entirety of the stomach is contained within the diaphragmatic hernia. No evidence of bowel obstruction. Contrast flows into the transverse colon. Vascular/Lymphatic: Abdominal aorta is normal caliber with atherosclerotic calcification. There is no retroperitoneal or periportal lymphadenopathy. No pelvic lymphadenopathy. Reproductive: Post prostatectomy. No pelvic lymphadenopathy. This mass lesion of Other: No free fluid. Musculoskeletal: No aggressive osseous lesion IMPRESSION: 1. No evidence of prostate cancer metastasis in the abdomen or pelvis. 2. Large diaphragmatic hernia within the entirety of the stomach within the herniated fat extending into the LEFT hemithorax. Electronically Signed   By: Suzy Bouchard M.D.   On: 05/03/2016 15:50    Assessment & Plan:   There are no diagnoses linked to this encounter. I have discontinued Mr. Boudoin terazosin. I am also having him maintain his Docusate Calcium (STOOL SOFTENER PO), (Diphenhydramine-APAP, sleep, (TYLENOL PM EXTRA STRENGTH PO)), losartan, triamcinolone ointment, acetaminophen, and Leuprolide Acetate (LUPRON IJ).  No orders of the defined types were placed in this encounter.    Follow-up: No Follow-up on file.  [name], MD

## 2016-10-18 NOTE — Assessment & Plan Note (Addendum)
R cheek lesion, scalp lesion, nose lesions on the R- sch skin bx

## 2016-10-18 NOTE — Patient Instructions (Signed)
Skin bx w/me 

## 2016-10-18 NOTE — Assessment & Plan Note (Signed)
Rx renewed

## 2016-11-01 ENCOUNTER — Ambulatory Visit: Payer: Medicare Other | Admitting: Internal Medicine

## 2016-11-04 ENCOUNTER — Ambulatory Visit (INDEPENDENT_AMBULATORY_CARE_PROVIDER_SITE_OTHER): Payer: Medicare Other | Admitting: Internal Medicine

## 2016-11-04 ENCOUNTER — Encounter: Payer: Self-pay | Admitting: Internal Medicine

## 2016-11-04 ENCOUNTER — Other Ambulatory Visit: Payer: Medicare Other

## 2016-11-04 DIAGNOSIS — D485 Neoplasm of uncertain behavior of skin: Secondary | ICD-10-CM

## 2016-11-04 DIAGNOSIS — D0439 Carcinoma in situ of skin of other parts of face: Secondary | ICD-10-CM | POA: Diagnosis not present

## 2016-11-04 DIAGNOSIS — C44329 Squamous cell carcinoma of skin of other parts of face: Secondary | ICD-10-CM | POA: Diagnosis not present

## 2016-11-04 NOTE — Patient Instructions (Signed)
Postprocedure instructions :    A Band-Aid should be  changed twice daily. You can take a shower tomorrow.  Keep the wounds clean. You can wash them with liquid soap and water. Pat dry with gauze or a Kleenex tissue  Before applying antibiotic ointment and a Band-Aid.   You need to report immediately  if fever, chills or any signs of infection develop.    The biopsy results should be available in 1 -2 weeks. 

## 2016-11-04 NOTE — Assessment & Plan Note (Signed)
R cheek lesion, scalp lesion, nose lesions on the R

## 2016-11-04 NOTE — Progress Notes (Signed)
Subjective:  Patient ID: Jorge [name], male    DOB: Apr 24, 1929  Age: 83 y.o. MRN: [id number]  CC: No chief complaint on file.   HPI [name] presents for skin bx  Outpatient Medications Prior to Visit  Medication Sig Dispense Refill  . acetaminophen (TYLENOL) 500 MG tablet Take 500 mg by mouth.    . Diphenhydramine-APAP, sleep, (TYLENOL PM EXTRA STRENGTH PO) Take by mouth.    [name] Calcium (STOOL SOFTENER PO) Take by mouth daily.    [name] Leuprolide Acetate (LUPRON IJ) Inject 0.2 mLs into the skin every 4 (four) months.    [name] losartan (COZAAR) 50 MG tablet Take 1 tablet (50 mg total) by mouth daily. 90 tablet 3  . triamcinolone ointment (KENALOG) 0.5 % Apply 1 application topically 3 (three) times daily. 15 g 1   No facility-administered medications prior to visit.     ROS Review of Systems  Objective:  BP 134/78 (BP Location: Left Arm, Patient Position: Sitting, Cuff Size: Large)   Pulse 64   Temp 98.6 F (37 C) (Oral)   Ht 6' (1.829 m)   Wt 224 lb (101.6 kg)   SpO2 98%   BMI 30.38 kg/m   BP Readings from Last 3 Encounters:  11/04/16 134/78  10/18/16 136/84  10/11/16 132/87    Wt Readings from Last 3 Encounters:  11/04/16 224 lb (101.6 kg)  10/18/16 221 lb (100.2 kg)  10/11/16 221 lb (100.2 kg)    Physical Exam Skin lesions - see below    Procedure Note :     Procedure :  Skin biopsy   Indication:   Suspicious lesion(s)   Risks including unsuccessful procedure , bleeding, infection, bruising, scar, a need for another complete procedure and others were explained to the patient in detail as well as the benefits. Informed consent was obtained.  The patient was placed in a decubitus position.  Lesion #1 on R cheek    measuring  6x5   mm   Skin over lesion #1  was prepped with Betadine and alcohol  and anesthetized with 1 cc of 2% lidocaine and epinephrine, using a 25-gauge 1 inch needle.  Shave biopsy with a sterile Dermablade was carried out  in the usual fashion. Hyfrecator was used to destroy the rest of the lesion potentially left behind and for hemostasis. Band-Aid was applied with antibiotic ointment.    Lesion #2 on R cheek    measuring  1x1 mm   Skin over lesion #2  was prepped with Betadine and alcohol  and anesthetized with 1 cc of 2% lidocaine and epinephrine, using a 25-gauge 1 inch needle.  Shave biopsy with a sterile Dermablade was carried out in the usual fashion. Hyfrecator was used to destroy the rest of the lesion potentially left behind and for hemostasis. Band-Aid was applied with antibiotic ointment.  Lesion #3-5 on R nose   measuring 1x1 mm each   Skin over lesion #3  was prepped with Betadine and alcohol  and anesthetized with 1 cc of 2% lidocaine and epinephrine, using a 25-gauge 1 inch needle.  Shave biopsy with a sterile Dermablade was carried out in the usual fashion. Hyfrecator was used to destroy the rest of the lesion potentially left behind and for hemostasis. Band-Aid was applied with antibiotic ointment.   Tolerated well. Complications none.      Postprocedure instructions :    A Band-Aid should be  changed twice daily. You can take a shower  tomorrow.  Keep the wounds clean. You can wash them with liquid soap and water. Pat dry with gauze or a Kleenex tissue  Before applying antibiotic ointment and a Band-Aid.   You need to report immediately  if fever, chills or any signs of infection develop.    The biopsy results should be available in 1 -2 weeks.    Lab Results  Component Value Date   WBC 7.3 08/16/2016   HGB 14.3 08/16/2016   HCT 43.3 08/16/2016   PLT 225 08/16/2016   GLUCOSE 107 08/16/2016   CHOL 164 10/21/2015   TRIG 116.0 10/21/2015   HDL 52.20 10/21/2015   LDLCALC 88 10/21/2015   ALT 16 08/16/2016   AST 21 08/16/2016   NA 141 08/16/2016   K 5.0 08/16/2016   CL 107 10/21/2015   CREATININE 1.0 08/16/2016   BUN 13.0 08/16/2016   CO2 25 08/16/2016   TSH 1.26 10/21/2015    PSA 0.46 01/10/2007   INR 1.06 06/03/2011   HGBA1C 6.1 (H) 01/10/2007    Ct Abdomen Pelvis Wo Contrast  Result Date: 05/03/2016 CLINICAL DATA:  Prostate cancer in 2011.  Prostatectomy 2008. EXAM: CT ABDOMEN AND PELVIS WITHOUT CONTRAST TECHNIQUE: Multidetector CT imaging of the abdomen and pelvis was performed following the standard protocol without IV contrast. COMPARISON:  None. FINDINGS: Lower chest: Lung bases are clear. Hepatobiliary: No focal hepatic lesion. No biliary duct dilatation. Gallbladder is normal. Common bile duct is normal. Pancreas: Pancreas is normal. No ductal dilatation. No pancreatic inflammation. Spleen: Post splenectomy. Adrenals/urinary tract: Adrenal glands and kidneys are normal. The ureters and bladder normal. Stomach/Bowel: Large diaphragmatic hernia on LEFT. Stomach extends the through the hernia with a large amount of stomach herniating through the diaphragm). The entirety of the stomach is contained within the diaphragmatic hernia. No evidence of bowel obstruction. Contrast flows into the transverse colon. Vascular/Lymphatic: Abdominal aorta is normal caliber with atherosclerotic calcification. There is no retroperitoneal or periportal lymphadenopathy. No pelvic lymphadenopathy. Reproductive: Post prostatectomy. No pelvic lymphadenopathy. This mass lesion of Other: No free fluid. Musculoskeletal: No aggressive osseous lesion IMPRESSION: 1. No evidence of prostate cancer metastasis in the abdomen or pelvis. 2. Large diaphragmatic hernia within the entirety of the stomach within the herniated fat extending into the LEFT hemithorax. Electronically Signed   By: Suzy Bouchard M.D.   On: 05/03/2016 15:50    Assessment & Plan:   Diagnoses and all orders for this visit:  Neoplasm of uncertain behavior of skin   I am having [name] maintain his Docusate Calcium (STOOL SOFTENER PO), (Diphenhydramine-APAP, sleep, (TYLENOL PM EXTRA STRENGTH PO)), triamcinolone ointment,  acetaminophen, Leuprolide Acetate (LUPRON IJ), and losartan.  No orders of the defined types were placed in this encounter.    Follow-up: No Follow-up on file.  [name], MD

## 2016-11-15 ENCOUNTER — Ambulatory Visit (INDEPENDENT_AMBULATORY_CARE_PROVIDER_SITE_OTHER): Payer: Medicare Other | Admitting: Internal Medicine

## 2016-11-15 ENCOUNTER — Encounter: Payer: Self-pay | Admitting: Internal Medicine

## 2016-11-15 VITALS — BP 128/76 | HR 65 | Temp 97.9°F | Ht 72.0 in | Wt 220.0 lb

## 2016-11-15 DIAGNOSIS — I10 Essential (primary) hypertension: Secondary | ICD-10-CM | POA: Diagnosis not present

## 2016-11-15 DIAGNOSIS — C4492 Squamous cell carcinoma of skin, unspecified: Secondary | ICD-10-CM | POA: Diagnosis not present

## 2016-11-15 DIAGNOSIS — F411 Generalized anxiety disorder: Secondary | ICD-10-CM

## 2016-11-15 NOTE — Assessment & Plan Note (Addendum)
Removed w/shave bx Healed well Option to see a skin surgeon suggested - pt refused Will re-check in 3-4 mo

## 2016-11-15 NOTE — Progress Notes (Addendum)
Subjective:  Patient ID: Jorge [name], male    DOB: 09-Jul-1929  Age: 83 y.o. MRN: [id number]  CC: No chief complaint on file.   HPI Jorge [name] presents for skin bx report discussion F/u HTN  Face healed well  Outpatient Medications Prior to Visit  Medication Sig Dispense Refill  . acetaminophen (TYLENOL) 500 MG tablet Take 500 mg by mouth.    . Diphenhydramine-APAP, sleep, (TYLENOL PM EXTRA STRENGTH PO) Take by mouth.    Jorge [name] Calcium (STOOL SOFTENER PO) Take by mouth daily.    Jorge [name] Kitchen Leuprolide Acetate (LUPRON IJ) Inject 0.2 mLs into the skin every 4 (four) months.    Jorge [name] Kitchen losartan (COZAAR) 50 MG tablet Take 1 tablet (50 mg total) by mouth daily. 90 tablet 3  . triamcinolone ointment (KENALOG) 0.5 % Apply 1 application topically 3 (three) times daily. 15 g 1   No facility-administered medications prior to visit.     ROS Review of Systems  Constitutional: Negative for appetite change, fatigue and unexpected weight change.  HENT: Negative for congestion, nosebleeds, sneezing, sore throat and trouble swallowing.   Eyes: Negative for itching and visual disturbance.  Respiratory: Negative for cough.   Gastrointestinal: Negative for abdominal distention, blood in stool, diarrhea and nausea.  Genitourinary: Negative for frequency and hematuria.  Musculoskeletal: Negative for back pain, gait problem, joint swelling and neck pain.  Skin: Negative for rash.  Neurological: Negative for dizziness, tremors, speech difficulty and weakness.  Psychiatric/Behavioral: Negative for agitation, dysphoric mood and sleep disturbance. The patient is not nervous/anxious.     Objective:  BP 128/76 (BP Location: Left Arm, Patient Position: Sitting, Cuff Size: Large)   Pulse 65   Temp 97.9 F (36.6 C) (Oral)   Ht 6' (1.829 m)   Wt 220 lb (99.8 kg)   SpO2 98%   BMI 29.84 kg/m   BP Readings from Last 3 Encounters:  11/15/16 128/76  11/04/16 134/78  10/18/16 136/84    Wt Readings  from Last 3 Encounters:  11/15/16 220 lb (99.8 kg)  11/04/16 224 lb (101.6 kg)  10/18/16 221 lb (100.2 kg)    Physical Exam  Constitutional: No distress.  Musculoskeletal: He exhibits tenderness.  Neurological: Coordination normal.  Skin: No erythema.  healing skin  Lab Results  Component Value Date   WBC 7.3 08/16/2016   HGB 14.3 08/16/2016   HCT 43.3 08/16/2016   PLT 225 08/16/2016   GLUCOSE 107 08/16/2016   CHOL 164 10/21/2015   TRIG 116.0 10/21/2015   HDL 52.20 10/21/2015   LDLCALC 88 10/21/2015   ALT 16 08/16/2016   AST 21 08/16/2016   NA 141 08/16/2016   K 5.0 08/16/2016   CL 107 10/21/2015   CREATININE 1.0 08/16/2016   BUN 13.0 08/16/2016   CO2 25 08/16/2016   TSH 1.26 10/21/2015   PSA 0.46 01/10/2007   INR 1.06 06/03/2011   HGBA1C 6.1 (H) 01/10/2007    Ct Abdomen Pelvis Wo Contrast  Result Date: 05/03/2016 CLINICAL DATA:  Prostate cancer in 2011.  Prostatectomy 2008. EXAM: CT ABDOMEN AND PELVIS WITHOUT CONTRAST TECHNIQUE: Multidetector CT imaging of the abdomen and pelvis was performed following the standard protocol without IV contrast. COMPARISON:  None. FINDINGS: Lower chest: Lung bases are clear. Hepatobiliary: No focal hepatic lesion. No biliary duct dilatation. Gallbladder is normal. Common bile duct is normal. Pancreas: Pancreas is normal. No ductal dilatation. No pancreatic inflammation. Spleen: Post splenectomy. Adrenals/urinary tract: Adrenal glands and kidneys are normal. The ureters  and bladder normal. Stomach/Bowel: Large diaphragmatic hernia on LEFT. Stomach extends the through the hernia with a large amount of stomach herniating through the diaphragm). The entirety of the stomach is contained within the diaphragmatic hernia. No evidence of bowel obstruction. Contrast flows into the transverse colon. Vascular/Lymphatic: Abdominal aorta is normal caliber with atherosclerotic calcification. There is no retroperitoneal or periportal lymphadenopathy. No  pelvic lymphadenopathy. Reproductive: Post prostatectomy. No pelvic lymphadenopathy. This mass lesion of Other: No free fluid. Musculoskeletal: No aggressive osseous lesion IMPRESSION: 1. No evidence of prostate cancer metastasis in the abdomen or pelvis. 2. Large diaphragmatic hernia within the entirety of the stomach within the herniated fat extending into the LEFT hemithorax. Electronically Signed   By: Suzy Bouchard M.D.   On: 05/03/2016 15:50    Assessment & Plan:   There are no diagnoses linked to this encounter. I am having [name] maintain his Docusate Calcium (STOOL SOFTENER PO), (Diphenhydramine-APAP, sleep, (TYLENOL PM EXTRA STRENGTH PO)), triamcinolone ointment, acetaminophen, Leuprolide Acetate (LUPRON IJ), and losartan.  No orders of the defined types were placed in this encounter.    Follow-up: No Follow-up on file.  [name], MD

## 2016-11-15 NOTE — Assessment & Plan Note (Signed)
Losartan in am; Hytrin at hs. 

## 2016-11-15 NOTE — Assessment & Plan Note (Signed)
Doing well 

## 2016-12-20 ENCOUNTER — Other Ambulatory Visit (HOSPITAL_BASED_OUTPATIENT_CLINIC_OR_DEPARTMENT_OTHER): Payer: Medicare Other

## 2016-12-20 DIAGNOSIS — C61 Malignant neoplasm of prostate: Secondary | ICD-10-CM

## 2016-12-20 LAB — COMPREHENSIVE METABOLIC PANEL
ALT: 15 U/L (ref 0–55)
ANION GAP: 6 meq/L (ref 3–11)
AST: 21 U/L (ref 5–34)
Albumin: 3.5 g/dL (ref 3.5–5.0)
Alkaline Phosphatase: 105 U/L (ref 40–150)
BILIRUBIN TOTAL: 0.33 mg/dL (ref 0.20–1.20)
BUN: 13.3 mg/dL (ref 7.0–26.0)
CO2: 25 meq/L (ref 22–29)
CREATININE: 0.9 mg/dL (ref 0.7–1.3)
Calcium: 9.4 mg/dL (ref 8.4–10.4)
Chloride: 109 mEq/L (ref 98–109)
EGFR: 76 mL/min/{1.73_m2} — ABNORMAL LOW (ref >90)
GLUCOSE: 109 mg/dL (ref 70–140)
Potassium: 4.5 mEq/L (ref 3.5–5.1)
SODIUM: 140 meq/L (ref 136–145)
TOTAL PROTEIN: 6.7 g/dL (ref 6.4–8.3)

## 2016-12-20 LAB — CBC WITH DIFFERENTIAL/PLATELET
BASO%: 0.3 % (ref 0.0–2.0)
Basophils Absolute: 0 10*3/uL (ref 0.0–0.1)
EOS%: 1.8 % (ref 0.0–7.0)
Eosinophils Absolute: 0.1 10*3/uL (ref 0.0–0.5)
HCT: 42.8 % (ref 38.4–49.9)
HGB: 14.2 g/dL (ref 13.0–17.1)
LYMPH%: 21.1 % (ref 14.0–49.0)
MCH: 32.1 pg (ref 27.2–33.4)
MCHC: 33.1 g/dL (ref 32.0–36.0)
MCV: 97 fL (ref 79.3–98.0)
MONO#: 0.6 10*3/uL (ref 0.1–0.9)
MONO%: 8.5 % (ref 0.0–14.0)
NEUT%: 68.3 % (ref 39.0–75.0)
NEUTROS ABS: 4.8 10*3/uL (ref 1.5–6.5)
Platelets: 177 10*3/uL (ref 140–400)
RBC: 4.41 10*6/uL (ref 4.20–5.82)
RDW: 13.1 % (ref 11.0–14.6)
WBC: 7 10*3/uL (ref 4.0–10.3)
lymph#: 1.5 10*3/uL (ref 0.9–3.3)

## 2016-12-21 LAB — TESTOSTERONE: Testosterone, Serum: 11 ng/dL — ABNORMAL LOW (ref 264–916)

## 2016-12-21 LAB — PSA: Prostate Specific Ag, Serum: 17.5 ng/mL — ABNORMAL HIGH (ref 0.0–4.0)

## 2016-12-22 ENCOUNTER — Telehealth: Payer: Self-pay | Admitting: Oncology

## 2016-12-22 ENCOUNTER — Ambulatory Visit (HOSPITAL_BASED_OUTPATIENT_CLINIC_OR_DEPARTMENT_OTHER): Payer: Medicare Other | Admitting: Oncology

## 2016-12-22 VITALS — BP 143/81 | HR 72 | Temp 98.7°F | Resp 18 | Ht 72.0 in | Wt 222.0 lb

## 2016-12-22 DIAGNOSIS — C778 Secondary and unspecified malignant neoplasm of lymph nodes of multiple regions: Secondary | ICD-10-CM | POA: Diagnosis not present

## 2016-12-22 DIAGNOSIS — E291 Testicular hypofunction: Secondary | ICD-10-CM

## 2016-12-22 DIAGNOSIS — C61 Malignant neoplasm of prostate: Secondary | ICD-10-CM | POA: Diagnosis not present

## 2016-12-22 NOTE — Telephone Encounter (Signed)
Gave patient AVS and calendar of upcoming January appointments °

## 2016-12-22 NOTE — Progress Notes (Signed)
Hematology and Oncology Follow Up Visit  CLEON [name] [id number] 12-03-1929 83 y.o. 12/22/2016 1:17 PM Jorge, [name] Jorge [name], MDPlotnikov, [name] Lacks, MD   Principle Diagnosis: 83 year old gentleman with prostate cancer diagnosed in 2008. He had a Gleason score 4+3 = 8. 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 No measurable disease noted.  Prior Therapy: He was treated initially with observation and subsequently received definitive radiation therapy in 2011. His radiation therapy concluded in March 2011.  He subsequently received 1 year of androgen deprivation with last injection given July 2011. His PSA was undetectable after the conclusion of therapy.  His PSA started to rise on October 2016 with a PSA of up to 4.57. His staging workup at that time showed periaortic and obturator lymph node enlargement consistent with metastatic disease. He was started on androgen deprivation at that time under the care of Dr. Diona Fanti and has been receiving it at that time.  His PSA did respond with a drop to 1.9 on 09/18/2015. His PSA on 01/29/2016 was 3.0. His bone scan obtained on 01/22/2016 showed no clear-cut evidence of metastatic disease.  CT scan in January 2018 showed no evidence of measurable disease.   Current therapy: Androgen deprivation only. Under consideration to start second line hormone therapy.  Interim History: [name] presents today for a follow-up visit. Since the last visit, he reports no changes in his health. He continues to be active and performs activities of daily living. He denied any recent hospitalization or illnesses. He denied any changes in his urinary status including hematuria or dysuria. He does not report any arthralgias or myalgias. He does not report any bone pain or weight loss. He continues to show excellent quality of life and performance status.  He does not report any headaches, blurry vision, syncope or seizures. He does not report any  fevers, chills, sweats or weight loss. He does not report any chest pain, palpitation, orthopnea or leg edema. He does not report any cough, wheezing or hemoptysis. He isn't reporting nausea, vomiting or abdominal pain. He does not report any frequency urgency or hesitancy. He does not report any skeletal complaints. Remaining review of systems unremarkable.   Medications: I have reviewed the patient's current medications.  Current Outpatient Prescriptions  Medication Sig Dispense Refill  . acetaminophen (TYLENOL) 500 MG tablet Take 500 mg by mouth.    . Diphenhydramine-APAP, sleep, (TYLENOL PM EXTRA STRENGTH PO) Take by mouth.    [name] Calcium (STOOL SOFTENER PO) Take by mouth daily.    [name] Leuprolide Acetate (LUPRON IJ) Inject 0.2 mLs into the skin every 4 (four) months.    [name] losartan (COZAAR) 50 MG tablet Take 1 tablet (50 mg total) by mouth daily. 90 tablet 3  . triamcinolone ointment (KENALOG) 0.5 % Apply 1 application topically 3 (three) times daily. 15 g 1   No current facility-administered medications for this visit.      Allergies:  Allergies  Allergen Reactions  . Aspirin Other (See Comments)    Ulcerated stomach  . Azithromycin Shortness Of Breath  . Cefuroxime Axetil Shortness Of Breath  . Iodine Other (See Comments)    Per pt tremors and dyspnea  . Naproxen Other (See Comments)    REACTION: upset stomach - like with other NSAIDs    Past Medical History, Surgical history, Social history, and Family History were reviewed and updated.  Physical Exam: Blood pressure (!) 143/81, pulse 72, temperature 98.7 F (37.1 C), temperature source Oral, resp. rate  18, height 6' (1.829 m), weight 222 lb (100.7 kg), SpO2 96 %. ECOG: 0 General appearance: Well-appearing gentleman without distress. Head: Normocephalic, without obvious abnormality no oral thrush or ulcers. Neck: no adenopathy Lymph nodes: Cervical, supraclavicular, and axillary nodes normal. Heart:regular rate and  rhythm, S1, S2 normal, no murmur, click, rub or gallop Lung:chest clear, no wheezing, rales, normal symmetric air entry,  Abdomin: soft, non-tender, without masses or organomegaly no rebound or guarding. EXT:no erythema, induration, or nodules   Lab Results: Lab Results  Component Value Date   WBC 7.0 12/20/2016   HGB 14.2 12/20/2016   HCT 42.8 12/20/2016   MCV 97.0 12/20/2016   PLT 177 12/20/2016     Chemistry      Component Value Date/Time   NA 140 12/20/2016 1307   K 4.5 12/20/2016 1307   CL 107 10/21/2015 1102   CO2 25 12/20/2016 1307   BUN 13.3 12/20/2016 1307   CREATININE 0.9 12/20/2016 1307      Component Value Date/Time   CALCIUM 9.4 12/20/2016 1307   ALKPHOS 105 12/20/2016 1307   AST 21 12/20/2016 1307   ALT 15 12/20/2016 1307   BILITOT 0.33 12/20/2016 1307     Results for TAYQUAN, Jorge [name] (MRN [id number]) as of 12/22/2016 13:13  Ref. Range 05/03/2016 10:35 08/16/2016 13:23 12/20/2016 13:07  Prostate Specific Ag, Serum Latest Ref Range: 0.0 - 4.0 ng/mL 6.2 (H) 10.3 (H) 17.5 (H)     Impression and Plan:  83 year old gentleman with the following issues:   1. Prostate cancer diagnosed in 2008. It is Gleason score 4+3 = 7 with prostate cancer involvement both lobes. He was treated with definitive radiation therapy and androgen deprivation concluded in July 2011. He developed metastatic disease in October 2016 with CT scan showed large periaortic and obturator lymph nodes consistent with metastatic disease. He was restarted on androgen deprivation with a PSA initially down to 1. PSA in October 2017 was 3.0.   His PSA on 05/03/2016 was 6.2 and CT scan obtained at the time of the abdomen and pelvis did not show any evidence of measurable disease.  His repeat PSA continues to show slow rise up to 17.5. He remains asymptomatic at this time.  Risks and benefits of starting systemic therapy at this time versus deferring till he develops measurable disease. After discussion  today, he favors deferring treatment unless absolutely needed to. I plan on repeating staging workup in January 2019 and consider different salvage therapy at this time. I will be in the form of adding Zytiga or Xtandi.  2. Androgen depravation: I have recommended that to continue indefinitely. His, receiving under the care of Dr. Diona Fanti.  3. Bone directed therapy: He has no evidence of advanced metastasis to the bone. Bone directed therapy will be an option if that happens in the future.  4. Follow-up: Will be in January 2019.   Zola Button, MD 9/20/20181:17 PM

## 2016-12-27 ENCOUNTER — Ambulatory Visit (INDEPENDENT_AMBULATORY_CARE_PROVIDER_SITE_OTHER): Payer: Medicare Other | Admitting: General Practice

## 2016-12-27 DIAGNOSIS — Z23 Encounter for immunization: Secondary | ICD-10-CM | POA: Diagnosis not present

## 2017-01-18 DIAGNOSIS — E291 Testicular hypofunction: Secondary | ICD-10-CM | POA: Diagnosis not present

## 2017-01-18 DIAGNOSIS — C61 Malignant neoplasm of prostate: Secondary | ICD-10-CM | POA: Diagnosis not present

## 2017-01-24 DIAGNOSIS — C61 Malignant neoplasm of prostate: Secondary | ICD-10-CM | POA: Diagnosis not present

## 2017-01-25 ENCOUNTER — Other Ambulatory Visit: Payer: Self-pay | Admitting: *Deleted

## 2017-03-14 ENCOUNTER — Ambulatory Visit: Payer: Medicare Other | Admitting: Internal Medicine

## 2017-03-17 ENCOUNTER — Other Ambulatory Visit: Payer: Self-pay | Admitting: *Deleted

## 2017-03-17 DIAGNOSIS — C61 Malignant neoplasm of prostate: Secondary | ICD-10-CM

## 2017-04-05 ENCOUNTER — Ambulatory Visit: Payer: Medicare Other | Admitting: Internal Medicine

## 2017-04-05 ENCOUNTER — Encounter: Payer: Self-pay | Admitting: Internal Medicine

## 2017-04-05 DIAGNOSIS — C61 Malignant neoplasm of prostate: Secondary | ICD-10-CM

## 2017-04-05 DIAGNOSIS — I10 Essential (primary) hypertension: Secondary | ICD-10-CM

## 2017-04-05 DIAGNOSIS — Z8546 Personal history of malignant neoplasm of prostate: Secondary | ICD-10-CM | POA: Diagnosis not present

## 2017-04-05 DIAGNOSIS — E559 Vitamin D deficiency, unspecified: Secondary | ICD-10-CM

## 2017-04-05 DIAGNOSIS — T50905A Adverse effect of unspecified drugs, medicaments and biological substances, initial encounter: Secondary | ICD-10-CM

## 2017-04-05 DIAGNOSIS — R232 Flushing: Secondary | ICD-10-CM | POA: Diagnosis not present

## 2017-04-05 MED ORDER — LOSARTAN POTASSIUM 100 MG PO TABS
100.0000 mg | ORAL_TABLET | Freq: Every day | ORAL | 3 refills | Status: DC
Start: 2017-04-05 — End: 2018-05-09

## 2017-04-05 NOTE — Assessment & Plan Note (Signed)
Worse Losartan dose was increased to 100 mg/d

## 2017-04-05 NOTE — Assessment & Plan Note (Signed)
He is tolerating sx's ok

## 2017-04-05 NOTE — Assessment & Plan Note (Addendum)
Dr Diona Fanti 2008 Prostatectomy, XRT and Lupron Dr Osker Mason appt is coming w/labs and a bone scan

## 2017-04-05 NOTE — Assessment & Plan Note (Signed)
Vit D 

## 2017-04-05 NOTE — Assessment & Plan Note (Signed)
  Prostatectomy, XRT and Lupron Dr Osker Mason appt is coming w/labs and a bone scan

## 2017-04-05 NOTE — Progress Notes (Signed)
Subjective:  Patient ID: Jorge [name], male    DOB: 24-Jul-1929  Age: 84 y.o. MRN: [id number]  CC: No chief complaint on file.   HPI Jorge [name] presents for prostate ca, hot flashes due to Lupron and HTN f/u BP has been up   Outpatient Medications Prior to Visit  Medication Sig Dispense Refill  . acetaminophen (TYLENOL) 500 MG tablet Take 500 mg by mouth.    . Diphenhydramine-APAP, sleep, (TYLENOL PM EXTRA STRENGTH PO) Take by mouth.    Jorge [name] Calcium (STOOL SOFTENER PO) Take by mouth daily.    [name] Leuprolide Acetate (LUPRON IJ) Inject 0.2 mLs into the skin every 4 (four) months.    [name] losartan (COZAAR) 50 MG tablet Take 1 tablet (50 mg total) by mouth daily. 90 tablet 3  . triamcinolone ointment (KENALOG) 0.5 % Apply 1 application topically 3 (three) times daily. 15 g 1   No facility-administered medications prior to visit.     ROS Review of Systems  Constitutional: Positive for diaphoresis. Negative for appetite change, fatigue and unexpected weight change.  HENT: Negative for congestion, nosebleeds, sneezing, sore throat and trouble swallowing.   Eyes: Negative for itching and visual disturbance.  Respiratory: Negative for cough.   Cardiovascular: Negative for chest pain, palpitations and leg swelling.  Gastrointestinal: Negative for abdominal distention, blood in stool, diarrhea and nausea.  Genitourinary: Negative for frequency and hematuria.  Musculoskeletal: Negative for back pain, gait problem, joint swelling and neck pain.  Skin: Negative for rash.  Neurological: Negative for dizziness, tremors, speech difficulty and weakness.  Psychiatric/Behavioral: Negative for agitation, dysphoric mood and sleep disturbance. The patient is not nervous/anxious.     Objective:  BP (!) 158/92 (BP Location: Left Arm, Patient Position: Sitting, Cuff Size: Large)   Pulse 62   Temp 98.1 F (36.7 C) (Oral)   Ht 6' (1.829 m)   Wt 220 lb (99.8 kg)   SpO2 98%   BMI 29.84  kg/m   BP Readings from Last 3 Encounters:  04/05/17 (!) 158/92  12/22/16 (!) 143/81  11/15/16 128/76    Wt Readings from Last 3 Encounters:  04/05/17 220 lb (99.8 kg)  12/22/16 222 lb (100.7 kg)  11/15/16 220 lb (99.8 kg)    Physical Exam  Constitutional: He is oriented to person, place, and time. He appears well-developed. No distress.  NAD  HENT:  Mouth/Throat: Oropharynx is clear and moist.  Eyes: Conjunctivae are normal. Pupils are equal, round, and reactive to light.  Neck: Normal range of motion. No JVD present. No thyromegaly present.  Cardiovascular: Normal rate, regular rhythm, normal heart sounds and intact distal pulses. Exam reveals no gallop and no friction rub.  No murmur heard. Pulmonary/Chest: Effort normal and breath sounds normal. No respiratory distress. He has no wheezes. He has no rales. He exhibits no tenderness.  Abdominal: Soft. Bowel sounds are normal. He exhibits no distension and no mass. There is no tenderness. There is no rebound and no guarding.  Musculoskeletal: Normal range of motion. He exhibits no edema or tenderness.  Lymphadenopathy:    He has no cervical adenopathy.  Neurological: He is alert and oriented to person, place, and time. He has normal reflexes. No cranial nerve deficit. He exhibits normal muscle tone. He displays a negative Romberg sign. Coordination and gait normal.  Skin: Skin is warm and dry. No rash noted.  Psychiatric: He has a normal mood and affect. His behavior is normal. Judgment and thought content normal.  Lab Results  Component Value Date   WBC 7.0 12/20/2016   HGB 14.2 12/20/2016   HCT 42.8 12/20/2016   PLT 177 12/20/2016   GLUCOSE 109 12/20/2016   CHOL 164 10/21/2015   TRIG 116.0 10/21/2015   HDL 52.20 10/21/2015   LDLCALC 88 10/21/2015   ALT 15 12/20/2016   AST 21 12/20/2016   NA 140 12/20/2016   K 4.5 12/20/2016   CL 107 10/21/2015   CREATININE 0.9 12/20/2016   BUN 13.3 12/20/2016   CO2 25  12/20/2016   TSH 1.26 10/21/2015   PSA 0.46 01/10/2007   INR 1.06 06/03/2011   HGBA1C 6.1 (H) 01/10/2007    Ct Abdomen Pelvis Wo Contrast  Result Date: 05/03/2016 CLINICAL DATA:  Prostate cancer in 2011.  Prostatectomy 2008. EXAM: CT ABDOMEN AND PELVIS WITHOUT CONTRAST TECHNIQUE: Multidetector CT imaging of the abdomen and pelvis was performed following the standard protocol without IV contrast. COMPARISON:  None. FINDINGS: Lower chest: Lung bases are clear. Hepatobiliary: No focal hepatic lesion. No biliary duct dilatation. Gallbladder is normal. Common bile duct is normal. Pancreas: Pancreas is normal. No ductal dilatation. No pancreatic inflammation. Spleen: Post splenectomy. Adrenals/urinary tract: Adrenal glands and kidneys are normal. The ureters and bladder normal. Stomach/Bowel: Large diaphragmatic hernia on LEFT. Stomach extends the through the hernia with a large amount of stomach herniating through the diaphragm). The entirety of the stomach is contained within the diaphragmatic hernia. No evidence of bowel obstruction. Contrast flows into the transverse colon. Vascular/Lymphatic: Abdominal aorta is normal caliber with atherosclerotic calcification. There is no retroperitoneal or periportal lymphadenopathy. No pelvic lymphadenopathy. Reproductive: Post prostatectomy. No pelvic lymphadenopathy. This mass lesion of Other: No free fluid. Musculoskeletal: No aggressive osseous lesion IMPRESSION: 1. No evidence of prostate cancer metastasis in the abdomen or pelvis. 2. Large diaphragmatic hernia within the entirety of the stomach within the herniated fat extending into the LEFT hemithorax. Electronically Signed   By: Suzy Bouchard M.D.   On: 05/03/2016 15:50    Assessment & Plan:   There are no diagnoses linked to this encounter. I am having Jorge [name] maintain his Docusate Calcium (STOOL SOFTENER PO), (Diphenhydramine-APAP, sleep, (TYLENOL PM EXTRA STRENGTH PO)), triamcinolone  ointment, acetaminophen, Leuprolide Acetate (LUPRON IJ), and losartan.  No orders of the defined types were placed in this encounter.    Follow-up: No Follow-up on file.  [name], MD

## 2017-04-09 ENCOUNTER — Telehealth: Payer: Self-pay | Admitting: Oncology

## 2017-04-09 NOTE — Telephone Encounter (Signed)
S/W PT, ADVISED APPT 1/22 MOVED TO 1/23 @ 1.15P DUE TO PMDC. PT VERBALIZED UNDERSTANDING.

## 2017-04-18 ENCOUNTER — Encounter (HOSPITAL_COMMUNITY)
Admission: RE | Admit: 2017-04-18 | Discharge: 2017-04-18 | Disposition: A | Discharge status: Home / Self Care | Payer: Medicare Other | Source: Ambulatory Visit | Attending: Oncology | Admitting: Oncology

## 2017-04-18 ENCOUNTER — Other Ambulatory Visit: Payer: Self-pay | Admitting: Oncology

## 2017-04-18 ENCOUNTER — Inpatient Hospital Stay: Payer: Medicare Other | Attending: Oncology

## 2017-04-18 ENCOUNTER — Ambulatory Visit (HOSPITAL_COMMUNITY)
Admission: RE | Admit: 2017-04-18 | Discharge: 2017-04-18 | Disposition: A | Discharge status: Home / Self Care | Payer: Medicare Other | Source: Ambulatory Visit | Attending: Oncology | Admitting: Oncology

## 2017-04-18 DIAGNOSIS — R937 Abnormal findings on diagnostic imaging of other parts of musculoskeletal system: Secondary | ICD-10-CM | POA: Diagnosis not present

## 2017-04-18 DIAGNOSIS — C61 Malignant neoplasm of prostate: Secondary | ICD-10-CM | POA: Diagnosis not present

## 2017-04-18 DIAGNOSIS — Z923 Personal history of irradiation: Secondary | ICD-10-CM | POA: Diagnosis not present

## 2017-04-18 DIAGNOSIS — R590 Localized enlarged lymph nodes: Secondary | ICD-10-CM | POA: Insufficient documentation

## 2017-04-18 DIAGNOSIS — C7951 Secondary malignant neoplasm of bone: Secondary | ICD-10-CM | POA: Insufficient documentation

## 2017-04-18 LAB — COMPREHENSIVE METABOLIC PANEL
ALT: 15 U/L (ref 0–55)
AST: 21 U/L (ref 5–34)
Albumin: 3.5 g/dL (ref 3.5–5.0)
Alkaline Phosphatase: 135 U/L (ref 40–150)
Anion gap: 6 (ref 3–11)
BUN: 11 mg/dL (ref 7–26)
CHLORIDE: 109 mmol/L (ref 98–109)
CO2: 26 mmol/L (ref 22–29)
Calcium: 9.3 mg/dL (ref 8.4–10.4)
Creatinine, Ser: 0.86 mg/dL (ref 0.70–1.30)
Glucose, Bld: 102 mg/dL (ref 70–140)
Potassium: 4.5 mmol/L (ref 3.5–5.1)
SODIUM: 141 mmol/L (ref 136–145)
Total Bilirubin: 0.3 mg/dL (ref 0.2–1.2)
Total Protein: 6.7 g/dL (ref 6.4–8.3)

## 2017-04-18 LAB — CBC WITH DIFFERENTIAL/PLATELET
Basophils Absolute: 0 10*3/uL (ref 0.0–0.1)
Basophils Relative: 0 %
EOS ABS: 0.1 10*3/uL (ref 0.0–0.5)
EOS PCT: 2 %
HCT: 43.1 % (ref 38.4–49.9)
Hemoglobin: 14.1 g/dL (ref 13.0–17.1)
LYMPHS ABS: 1.3 10*3/uL (ref 0.9–3.3)
Lymphocytes Relative: 22 %
MCH: 32.3 pg (ref 27.2–33.4)
MCHC: 32.7 g/dL (ref 32.0–36.0)
MCV: 98.6 fL — AB (ref 79.3–98.0)
Monocytes Absolute: 0.5 10*3/uL (ref 0.1–0.9)
Monocytes Relative: 9 %
Neutro Abs: 3.9 10*3/uL (ref 1.5–6.5)
Neutrophils Relative %: 67 %
Platelets: 206 10*3/uL (ref 140–400)
RBC: 4.37 MIL/uL (ref 4.20–5.82)
RDW: 13.2 % (ref 11.0–15.6)
WBC: 5.9 10*3/uL (ref 4.0–10.3)

## 2017-04-18 MED ORDER — TECHNETIUM TC 99M MEDRONATE IV KIT
21.6000 | PACK | Freq: Once | INTRAVENOUS | Status: AC | PRN
  Administered 2017-04-18: 21.6 via INTRAVENOUS

## 2017-04-19 LAB — PROSTATE-SPECIFIC AG, SERUM (LABCORP): Prostate Specific Ag, Serum: 29.7 ng/mL — ABNORMAL HIGH (ref 0.0–4.0)

## 2017-04-25 ENCOUNTER — Ambulatory Visit: Payer: Medicare Other | Admitting: Oncology

## 2017-04-26 ENCOUNTER — Inpatient Hospital Stay (HOSPITAL_BASED_OUTPATIENT_CLINIC_OR_DEPARTMENT_OTHER): Payer: Medicare Other | Admitting: Oncology

## 2017-04-26 VITALS — BP 178/89 | HR 75 | Temp 98.0°F | Resp 18 | Ht 72.0 in | Wt 219.7 lb

## 2017-04-26 DIAGNOSIS — Z923 Personal history of irradiation: Secondary | ICD-10-CM | POA: Diagnosis not present

## 2017-04-26 DIAGNOSIS — C61 Malignant neoplasm of prostate: Secondary | ICD-10-CM

## 2017-04-26 DIAGNOSIS — C7951 Secondary malignant neoplasm of bone: Secondary | ICD-10-CM

## 2017-04-26 NOTE — Progress Notes (Signed)
Hematology and Oncology Follow Up Visit  Jorge [name] [id number] 08-18-1929 84 y.o. 04/26/2017 1:20 PM Jorge [name], Jorge [name], MDPlotnikov, Jorge Lacks, MD   Principle Diagnosis: 84 year old man with prostate cancer diagnosed in 2008. He had a Gleason score 4+3 = 8. 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  Prior Therapy: He was treated initially with radiation therapy in 2011. His radiation therapy concluded in March 2011.   Androgen deprivation with last injection given July 2011. His PSA was undetectable after the conclusion of therapy.   His PSA started to rise on October 2016 with a PSA of up to 4.57. His staging workup at that time showed periaortic and obturator lymph node enlargement consistent with metastatic disease. He was started on androgen deprivation at that time under the care of Dr. Diona Fanti and has been receiving it at that time. e.  He developed castration resistant disease and is measurable in January 2019.  Current therapy: Androgen deprivation only.  He has refused second line hormone therapy.  Interim History: Jorge [name] is here for a follow-up visit by himself.  He continues to feel well without any major changes in his performance status or activity level.  He continues to be active and lives independently.  He denies any bone pain, pathological fractures or constitutional symptoms.  He denies any urination difficulties or change in his bowel habits.  His appetite and quality of life remain excellent.  He does not report any headaches, blurry vision, syncope or seizures. He does not report any fevers, chills, sweats or weight loss. He does not report any chest pain, palpitation, orthopnea or leg edema. He does not report any cough, wheezing or hemoptysis.  He reports no nausea, vomiting or abdominal pain. He does not report any frequency urgency or hesitancy. He does not report any skeletal complaints.  Remaining review of system  is negative  Medications: I have reviewed the patient's current medications.  Current Outpatient Medications  Medication Sig Dispense Refill  . acetaminophen (TYLENOL) 500 MG tablet Take 500 mg by mouth.    . Diphenhydramine-APAP, sleep, (TYLENOL PM EXTRA STRENGTH PO) Take by mouth.    [name] Calcium (STOOL SOFTENER PO) Take by mouth daily.    [name] Leuprolide Acetate (LUPRON IJ) Inject 0.2 mLs into the skin every 4 (four) months.    [name] losartan (COZAAR) 100 MG tablet Take 1 tablet (100 mg total) by mouth daily. 90 tablet 3  . triamcinolone ointment (KENALOG) 0.5 % Apply 1 application topically 3 (three) times daily. 15 g 1   No current facility-administered medications for this visit.      Allergies:  Allergies  Allergen Reactions  . Aspirin Other (See Comments)    Ulcerated stomach  . Azithromycin Shortness Of Breath  . Cefuroxime Axetil Shortness Of Breath  . Iodine Other (See Comments)    Per pt tremors and dyspnea  . Naproxen Other (See Comments)    REACTION: upset stomach - like with other NSAIDs    Past Medical History, Surgical history, Social history, and Family History were reviewed and updated.  Physical Exam: Blood pressure (!) 178/89, pulse 75, temperature 98 F (36.7 C), temperature source Oral, resp. rate 18, height 6' (1.829 m), weight 219 lb 11.2 oz (99.7 kg), SpO2 99 %. ECOG: 0 General appearance: Alert, awake gentleman without distress. Head: Normocephalic, without obvious abnormality  Oral mucosa: No oral thrush or ulcers. Lymph nodes: Cervical, supraclavicular, and axillary nodes normal. Heart:regular rate and rhythm, S1,  S2 normal, no murmur, click, rub or gallop Lung:chest clear, no wheezing, rales, normal symmetric air entry,  Abdomin: soft, non-tender, without masses or organomegaly no shifting dullness or ascites.. Skeletal: No joint effusion or tenderness. Skin: No rashes or lesions.   Lab Results: Lab Results  Component Value Date   WBC 5.9  04/18/2017   HGB 14.1 04/18/2017   HCT 43.1 04/18/2017   MCV 98.6 (H) 04/18/2017   PLT 206 04/18/2017     Chemistry      Component Value Date/Time   NA 141 04/18/2017 0856   NA 140 12/20/2016 1307   K 4.5 04/18/2017 0856   K 4.5 12/20/2016 1307   CL 109 04/18/2017 0856   CO2 26 04/18/2017 0856   CO2 25 12/20/2016 1307   BUN 11 04/18/2017 0856   BUN 13.3 12/20/2016 1307   CREATININE 0.86 04/18/2017 0856   CREATININE 0.9 12/20/2016 1307      Component Value Date/Time   CALCIUM 9.3 04/18/2017 0856   CALCIUM 9.4 12/20/2016 1307   ALKPHOS 135 04/18/2017 0856   ALKPHOS 105 12/20/2016 1307   AST 21 04/18/2017 0856   AST 21 12/20/2016 1307   ALT 15 04/18/2017 0856   ALT 15 12/20/2016 1307   BILITOT 0.3 04/18/2017 0856   BILITOT 0.33 12/20/2016 1307     Results for [name], Jorge (MRN [id number]) as of 04/26/2017 13:24  Ref. Range 12/20/2016 13:07 04/18/2017 08:56  Prostate Specific Ag, Serum Latest Ref Range: 0.0 - 4.0 ng/mL 17.5 (H) 29.7 (H)   EXAM: NUCLEAR MEDICINE WHOLE BODY BONE SCAN  TECHNIQUE: Whole body anterior and posterior images were obtained approximately 3 hours after intravenous injection of radiopharmaceutical.  RADIOPHARMACEUTICALS:  21.6 mCi Technetium-69m MDP IV  COMPARISON:  01/22/2016  Radiographic correlation: CT abdomen and pelvis 04/18/2017  FINDINGS: Abnormal foci of increased tracer localization are identified at the RIGHT supra-acetabular region of the pelvis, anteromedial wall of the LEFT acetabulum, at the LEFT lateral aspect of the lower cervical spine, and at LEFT lateral aspect of the lumbar spine at approximately L2.  The sites are new since the previous exam in osseous metastatic disease is not excluded.  Uptake at the anteromedial wall of the LEFT acetabulum corresponds to an area of sclerosis on axial CT series 2, images 70-72.  Uptake at the L2 vertebral body corresponds to an area of sclerosis at the inferior aspect  of the L2 vertebral body LEFT of midline on coronal CT series 5, image 80.  No definite CT correlate is seen for uptake at the RIGHT supra-acetabular region.  Additional uptake at the shoulders, sternoclavicular joints, RIGHT knee, RIGHT wrist, and lower lumbar spine, typically degenerative.  Expected urinary tract and soft tissue distribution of tracer.  IMPRESSION: New foci of abnormal increased tracer localization at the pelvis bilaterally, lower cervical spine, and L2 vertebral body highly suspicious for osseous metastases.  EXAM: CT ABDOMEN AND PELVIS WITHOUT CONTRAST  TECHNIQUE: Multidetector CT imaging of the abdomen and pelvis was performed following the standard protocol without IV contrast.  COMPARISON:  CT 05/03/2016  FINDINGS: Lower chest: Lung bases are clear.  Hepatobiliary: No focal hepatic lesion. No biliary duct dilatation. Gallbladder is normal. Common bile duct is normal.  Pancreas: Pancreas is normal. No ductal dilatation. No pancreatic inflammation.  Spleen: Normal spleen  Adrenals/urinary tract: Adrenal glands and kidneys are normal. The ureters and bladder normal.  Stomach/Bowel: Stomach, small bowel, appendix, and cecum are normal. The colon and rectosigmoid colon are normal.  Vascular/Lymphatic: Abdominal aorta is normal caliber. There is no retroperitoneal or periportal lymphadenopathy. No pelvic lymphadenopathy.  Reproductive: Post prostatectomy. No nodularity prostatectomy bed. There small lymph nodes along the aorta which have increased slightly size example LEFT common iliac lymph node measuring 9 mm (image 40, series 2 increased from 5 mm. Lymph node LEFT of the aorta at the level of veins measuring 10 mm comparison 3 mm. LEFT external iliac lymph node measuring 12 mm increased from 4 mm (image 61 series 2).  More superiorly, new retrocrural lymph measures 16 mm (image 15, series 2 increased from  Other: No free  fluid.  Musculoskeletal: No aggressive osseous lesion. Bulky osteophytosis of lumbar spine  IMPRESSION: 1. New lymphadenopathy along the LEFT iliac vessels, periaortic nodal station and retrocrural nodal station most consistent prostate cancer metastatic adenopathy. Consider fluciclovine PET-CT scan to evaluate extent of disease and add specificity. 2. No evidence skeletal metastasis. 3. large diaphragmatic.   Impression and Plan:  84 year old gentleman with the following issues:   1.  Castration resistant metastatic prostate cancer diagnosed in 2008. It is Gleason score 4+3 = 7. He was treated with definitive radiation therapy and androgen deprivation concluded in July 2011.   He developed metastatic disease in October 2016 with CT scan showed large periaortic and obturator lymph nodes consistent with metastatic disease. He was restarted on androgen deprivation initially.  He developed castration resistant disease and currently has measurable disease in the bone and lymphadenopathy.  CT scan and bone scan obtained on April 18, 2017 were personally reviewed today and discussed with the patient.  These imaging study showed progression of disease although he is completely asymptomatic.  Risks and benefits of starting systemic therapy was reviewed again.  The will be in the form of chemotherapy, Xtandi or Zytiga.   After discussion today, he continues to defer systemic therapy citing his excellent quality of life.  He understands that he has an incurable malignancy that can be symptomatic in the future I would like to defer any additional treatment at this time.  I have recommended continued monitoring at this time and restart systemic therapy if he has any symptoms in the future.  I will be in the form of Xtandi which would be the least intrusive to his quality of life.   2. Androgen depravation: I recommended continue Lupron indefinitely for the time being.  3. Bone directed  therapy: Delton See could be considered in the future.  4. Follow-up: Will be in 6 months and sooner if needed.   15  minutes was spent with the patient face-to-face today.  More than 50% of time was dedicated to patient counseling, education and coordination of his multifaceted care.    Zola Button, MD 1/23/20191:20 PM

## 2017-05-24 DIAGNOSIS — Z5111 Encounter for antineoplastic chemotherapy: Secondary | ICD-10-CM | POA: Diagnosis not present

## 2017-05-24 DIAGNOSIS — Z79899 Other long term (current) drug therapy: Secondary | ICD-10-CM | POA: Diagnosis not present

## 2017-05-24 DIAGNOSIS — C61 Malignant neoplasm of prostate: Secondary | ICD-10-CM | POA: Diagnosis not present

## 2017-05-24 DIAGNOSIS — C7951 Secondary malignant neoplasm of bone: Secondary | ICD-10-CM | POA: Diagnosis not present

## 2017-05-25 ENCOUNTER — Encounter: Payer: Self-pay | Admitting: Radiation Oncology

## 2017-06-01 ENCOUNTER — Encounter: Payer: Self-pay | Admitting: Oncology

## 2017-06-01 ENCOUNTER — Telehealth: Payer: Self-pay | Admitting: Oncology

## 2017-06-01 NOTE — Telephone Encounter (Signed)
Appt has been scheduled for the pt to see Dr. Benay Spice on 3/19 at 2pm. Lft appt date and time on the pt's vm. Letter mailed.

## 2017-06-09 ENCOUNTER — Encounter: Payer: Self-pay | Admitting: Radiation Oncology

## 2017-06-09 NOTE — Progress Notes (Signed)
Histology and Location of Primary Cancer: Prostatic adenocarcinoma diagnosed in 2008 with Gleason 4+4  Sites of Visceral and Bony Metastatic Disease: pelvis bilaterally, L2 vertebral body, left acetabulum, lymphadenopathy in the left iliac region, periaortic region and retrocrural region  Location(s) of Symptomatic Metastases: left hip pain  Past/Anticipated chemotherapy by medical oncology, if any: evaluated by Dr. Alen Blew due to increasing PSA trends. Dr. Diona Fanti requesting Dr. Benay Spice assume care of patient (at patient's request).  Pain on a scale of 0-10 is: denies any pain for almost 2 weeks    If Spine Met(s), symptoms, if any, include:  Bowel/Bladder retention or incontinence (please describe): urinary leakage and incontinence  Numbness or weakness in extremities (please describe): no  Current Decadron regimen, if applicable: no  Ambulatory status? Walker? Wheelchair?: Ambulatory  SAFETY ISSUES:  Prior radiation? Yes by Dr. Valere Dross 2010-2011  Pacemaker/ICD? no  Possible current pregnancy? no  Is the patient on methotrexate? no  Current Complaints / other details:  84 year old male. Retired 22 years ago. Lost his wife from breast cancer in 2002  04/18/2017   PSA  29.7 Received Lupron on 05/24/2017 Referred to discuss local therapy for hip pain vs. Trudi Ida

## 2017-06-12 ENCOUNTER — Encounter: Payer: Self-pay | Admitting: Medical Oncology

## 2017-06-12 ENCOUNTER — Other Ambulatory Visit: Payer: Self-pay

## 2017-06-12 ENCOUNTER — Encounter: Payer: Self-pay | Admitting: Radiation Oncology

## 2017-06-12 ENCOUNTER — Ambulatory Visit
Admission: RE | Admit: 2017-06-12 | Discharge: 2017-06-12 | Disposition: A | Discharge status: Home / Self Care | Payer: Medicare Other | Source: Ambulatory Visit | Attending: Radiation Oncology | Admitting: Radiation Oncology

## 2017-06-12 VITALS — BP 169/94 | HR 75 | Temp 98.0°F | Resp 18 | Ht 73.5 in | Wt 221.6 lb

## 2017-06-12 DIAGNOSIS — C7951 Secondary malignant neoplasm of bone: Secondary | ICD-10-CM

## 2017-06-12 DIAGNOSIS — I10 Essential (primary) hypertension: Secondary | ICD-10-CM | POA: Diagnosis not present

## 2017-06-12 DIAGNOSIS — Z886 Allergy status to analgesic agent status: Secondary | ICD-10-CM | POA: Diagnosis not present

## 2017-06-12 DIAGNOSIS — C778 Secondary and unspecified malignant neoplasm of lymph nodes of multiple regions: Secondary | ICD-10-CM | POA: Diagnosis not present

## 2017-06-12 DIAGNOSIS — Z9889 Other specified postprocedural states: Secondary | ICD-10-CM | POA: Diagnosis not present

## 2017-06-12 DIAGNOSIS — Z192 Hormone resistant malignancy status: Secondary | ICD-10-CM | POA: Diagnosis not present

## 2017-06-12 DIAGNOSIS — Z881 Allergy status to other antibiotic agents status: Secondary | ICD-10-CM | POA: Insufficient documentation

## 2017-06-12 DIAGNOSIS — Z888 Allergy status to other drugs, medicaments and biological substances status: Secondary | ICD-10-CM | POA: Insufficient documentation

## 2017-06-12 DIAGNOSIS — Z79899 Other long term (current) drug therapy: Secondary | ICD-10-CM | POA: Diagnosis not present

## 2017-06-12 DIAGNOSIS — Z87891 Personal history of nicotine dependence: Secondary | ICD-10-CM | POA: Insufficient documentation

## 2017-06-12 DIAGNOSIS — C7952 Secondary malignant neoplasm of bone marrow: Principal | ICD-10-CM

## 2017-06-12 DIAGNOSIS — C61 Malignant neoplasm of prostate: Secondary | ICD-10-CM

## 2017-06-12 DIAGNOSIS — Z598 Other problems related to housing and economic circumstances: Secondary | ICD-10-CM

## 2017-06-12 DIAGNOSIS — Z599 Problem related to housing and economic circumstances, unspecified: Secondary | ICD-10-CM

## 2017-06-12 HISTORY — DX: Personal history of irradiation: Z92.3

## 2017-06-12 NOTE — Progress Notes (Addendum)
SEE Addendum added after visit    Radiation Oncology         [phone]) [phone] ________________________________  Initial Outpatient Consultation  Name: Jorge [name] MRN: [id number]  Date of Service: 06/12/2017 DOB: [birth year]  [id number], Jorge Lacks, MD  Jorge Gallo, MD   REFERRING PHYSICIAN: Franchot Gallo, MD  DIAGNOSIS: 84 y.o. 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    ICD-10-CM   1. Secondary malignant neoplasm of bone and bone marrow (HCC) C79.51    C79.52     HISTORY OF PRESENT ILLNESS: Jorge [name] is an 84 y.o. male seen at the request of Dr. Diona [name] for castration resistant prostate cancer with significant metastatic adenopathy and bone disease. He was originally diagnosed with Gleason 4+3 prostate cancer s/p prostatectomy in June 2008. He had high risk features at that time with positive margins, and perineural invasion. His PSA continued to elevate and he was treated with external beam radiation therapy that was completed in March 2011. He also received one full year of ADT with the final injection of Lupron in July 2011. He initially had an excellent PSA response to therapy but then had another increase in PSA to 4.57 in October 2016. At that time, his bone scan was negative, but CT scan revealed larger periaortic and obturator lymph nodes consistent with metastatic disease. He was started back on ADT and had been stable on this up until  his PSA continued to rise. His most recent CT on 04/18/2017 showed new lymphadenopathy in the left iliac region, periaortic region, and retrocrural region consistent with metastatic adenopathy. The largest of the lymph nodes was 12 mm in the left iliac chain and a 16 mm retrocrural node. He  had a PSA drawn on 04/18/2017 that was 29.7 His bone scan on 04/18/2017 showed foci of increased uptake in the low cervical spine, pelvis bilaterally, L2 vertebral body,  and left acetabulum. He underwent CT of the abdomen and pelvis that showed left pelvis side wall and periaortic adenopathy. He presented to Dr. Diona [name] on 05/24/2017 with new onset left hip pain and received another injection of Lupron. He was referred today for consideration of local therapy versus Xofigo.   PREVIOUS RADIATION THERAPY:   Completed 06/24/2009 with Dr. Valere [name]: Prostatic fossa treatment details on treatment dosing unavailable at the time of dictation.  PAST MEDICAL HISTORY:  Past Medical History:  Diagnosis Date  . Anxiety   . ED (erectile dysfunction)   . Gastric ulcer    Dr. Ardis [name]  . GERD (gastroesophageal reflux disease)   . Hernia, hiatal   . History of colonic polyps   . Hx of radiation therapy   . Hypertension   . Insomnia   . Prostate cancer [facility]) 2010   Dr. Luberta [name]  . Syncope 2011   from Bicalutamide  . Vitamin D deficiency       PAST SURGICAL HISTORY: Past Surgical History:  Procedure Laterality Date  . APPENDECTOMY     age 5  . CYST REMOVAL NECK N/A 10/16/2014   Procedure: EXCISION CYST POSTERIOR NECK;  Surgeon: Autumn Messing III, MD;  Location: [facility];  Service: General;  Laterality: N/A;  posterior  . ESOPHAGOGASTRODUODENOSCOPY  06/05/2011   Procedure: ESOPHAGOGASTRODUODENOSCOPY (EGD);  Surgeon: Jorge Mayer, MD;  Location: [facility] ENDOSCOPY;  Service: Endoscopy;  Laterality: N/A;  . HERNIA REPAIR  06/10/2011      . HIATAL HERNIA REPAIR  06/10/2011   Procedure:  LAPAROSCOPIC REPAIR OF HIATAL HERNIA;  Surgeon: Jorge Earls, MD;  Location: [place];  Service: General;  Laterality: N/A;  Laparoscopic repair of paraesophageal hernia.  Jorge [name] PROSTATECTOMY  2008  . TRANSURETHRAL RESECTION OF PROSTATE      FAMILY HISTORY:  Family History  Problem Relation Age of Onset  . Pancreatic cancer Father   . Hypertension Father   . Cancer Father   . Hypertension Other     SOCIAL HISTORY:  Social History   Socioeconomic History  . Marital  status: Widowed    Spouse name: Not on file  . Number of children: Not on file  . Years of education: Not on file  . Highest education level: Not on file  Social Needs  . Financial resource strain: Not on file  . Food insecurity - worry: Not on file  . Food insecurity - inability: Not on file  . Transportation needs - medical: Not on file  . Transportation needs - non-medical: Not on file  Occupational History  . Occupation: retired    Fish farm manager: RETIRED  Tobacco Use  . Smoking status: Former Research scientist (life sciences)  . Smokeless tobacco: Never Used  Substance and Sexual Activity  . Alcohol use: No    Comment: social  . Drug use: No  . Sexual activity: Yes  Other Topics Concern  . Not on file  Social History Narrative   Regular exercise- yes/golf  The patient is widowed and lives in [place].   ALLERGIES: Aspirin; Azithromycin; Cefuroxime axetil; Iodine; and Naproxen  MEDICATIONS:  Current Outpatient Medications  Medication Sig Dispense Refill  . Diphenhydramine-APAP, sleep, (TYLENOL PM EXTRA STRENGTH PO) Take by mouth.    . losartan (COZAAR) 100 MG tablet Take 1 tablet (100 mg total) by mouth daily. 90 tablet 3  . acetaminophen (TYLENOL) 500 MG tablet Take 500 mg by mouth.    Jorge [name] Calcium (STOOL SOFTENER PO) Take by mouth daily.    Jorge [name] Leuprolide Acetate (LUPRON IJ) Inject 0.2 mLs into the skin every 4 (four) months.     No current facility-administered medications for this encounter.     REVIEW OF SYSTEMS:  On review of systems, the patient reports that he is doing well overall. He denies any chest pain, shortness of breath, cough, fevers, chills, night sweats, or unintended weight changes. He reports urinary leakage and incontinence. He denies any bowel disturbances, and denies abdominal pain, nausea or vomiting. He reported new onset left hip pain 3 weeks ago but states today that he has not had any pain since then. He denies any numbness or weakness. A complete review of systems is  obtained and is otherwise negative.    PHYSICAL EXAM:  Wt Readings from Last 3 Encounters:  06/12/17 221 lb 9.6 oz (100.5 kg)  04/26/17 219 lb 11.2 oz (99.7 kg)  04/05/17 220 lb (99.8 kg)   Temp Readings from Last 3 Encounters:  06/12/17 98 F (36.7 C) (Oral)  04/26/17 98 F (36.7 C) (Oral)  04/05/17 98.1 F (36.7 C) (Oral)   BP Readings from Last 3 Encounters:  06/12/17 (!) 169/94  04/26/17 (!) 178/89  04/05/17 (!) 158/92   Pulse Readings from Last 3 Encounters:  06/12/17 75  04/26/17 75  04/05/17 62   Pain Assessment Pain Score: 0-No pain/10  In general this is a well appearing caucasian male in no acute distress. He is alert and oriented x4 and appropriate throughout the examination. HEENT reveals that the patient is normocephalic, atraumatic. Skin is intact without  any evidence of gross lesions. Cardiovascular exam reveals a regular rate and rhythm, no clicks rubs or murmurs are auscultated. Chest is clear to auscultation bilaterally. Lymphatic assessment is performed and does not reveal any adenopathy in the supraclavicular, axillary, or inguinal chains. Abdomen has active bowel sounds in all quadrants and is intact. The abdomen is soft, non tender, non distended. Lower extremities are negative for pretibial pitting edema, deep calf tenderness, cyanosis or clubbing.   KPS = 90  100 - Normal; no complaints; no evidence of disease. 90   - Able to carry on normal activity; minor signs or symptoms of disease. 80   - Normal activity with effort; some signs or symptoms of disease. 21   - Cares for self; unable to carry on normal activity or to do active work. 60   - Requires occasional assistance, but is able to care for most of his personal needs. 50   - Requires considerable assistance and frequent medical care. 65   - Disabled; requires special care and assistance. 39   - Severely disabled; hospital admission is indicated although death not imminent. 40   - Very sick;  hospital admission necessary; active supportive treatment necessary. 10   - Moribund; fatal processes progressing rapidly. 0     - Dead  Karnofsky DA, Abelmann Higden, Craver LS and Burchenal [facility] [phone]) The use of the nitrogen mustards in the palliative treatment of carcinoma: with particular reference to bronchogenic carcinoma Cancer 1 [phone]  LABORATORY DATA:  Lab Results  Component Value Date   WBC 5.9 04/18/2017   HGB 14.1 04/18/2017   HCT 43.1 04/18/2017   MCV 98.6 (H) 04/18/2017   PLT 206 04/18/2017   Lab Results  Component Value Date   NA 141 04/18/2017   K 4.5 04/18/2017   CL 109 04/18/2017   CO2 26 04/18/2017   Lab Results  Component Value Date   ALT 15 04/18/2017   AST 21 04/18/2017   ALKPHOS 135 04/18/2017   BILITOT 0.3 04/18/2017     RADIOGRAPHY: No results found.    IMPRESSION/PLAN: 1. 84 y.o. 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 We reviewed the options of treatment. At this time he is not in need of palliative radiotherapy as his pain only occurred in the hip on one occasion. In addition, he's not a candidate for Xofigo at this time given his lymph node involvement. We did discuss if he became symptomatic we could consider radiotherapy to his left hip or elsewhere if this corresponded to bony disease. We discussed the risks, benefits, short, and long term effects of radiotherapy. He is a potential candidate for additional hormone blockade with Gillermina Phy or Zytiga versus Jevtana, and will discuss this in greater detail with Dr. Benay Spice whom he's meeting with next week for second opinion. We will be happy to see him back in the future if there becomes a more clear role for radiotherapy. 2. Financial constraints. The patient expresses concern for the cost of certain medications he may go on to receive. We will place a social work order so he can find out about what resources he may qualify  for.    Carola Rhine, PAC    Tyler Pita, MD  [facility] Direct Dial: [phone]  Fax: [phone] [email or web address]  Skype  [email or web address]     This document serves as a record of services personally performed by Tyler Pita, MD and Shona Simpson, PA-C.  It was created on their behalf by Rae Lips, a trained medical scribe. The creation of this record is based on the scribe's personal observations and the providers' statements to them. This document has been checked and approved by the attending providers.  ADDENDUM: Following our consultation visit today, I reviewed the patient's CAT scans again as well as the inclusion criteria for the Xofigo trial.  Patients with bulky lymphadenopathy were in fact excluded from [facility] therapy.  However, the definition of bulky lymphadenopathy was set at a lymph node size of 3 cm.  In review of this patient's CT imaging, his lymph nodes are smaller than 3 cm.  As a result, I feel that I was mistaken and advising him against Xofigo at this time.  I took a few minutes to telephone the patient to review this change in my recommendation.  I apologized for over looking this important consideration regarding a lymph node size.  The patient expressed gratitude for the phone call and appreciation for my candor.  At this time, he will continue to proceed with evaluation with Dr. Benay Spice.  At this time, if there is agreement, he would be eligible to proceed with [facility] therapy.

## 2017-06-12 NOTE — Progress Notes (Signed)
[name] states he is not sure why he is here today. He was having pain in his left hip but it has improved. He states that Dr. Diona Fanti have him a prescription and when he went to the pharmacy he was told it cost $500.00 a month. He states he cannot afford so he is not sure what to do. Per Dr. Hazeline Junker last office note he was deferring additional treatment this time.[name] will be seeing Dr. Benay Spice in the future.  I informed [name] if Dr. Benay Spice would like for him to add Gillermina Phy, our oral chemo navigator can help him contacting his insurance company and patient co-pay foundations. I will follow up with him the day he sees Dr. Benay Spice.

## 2017-06-12 NOTE — Progress Notes (Signed)
See progress note under physician encounter. 

## 2017-06-15 NOTE — Addendum Note (Signed)
Encounter addended by: Tyler Pita, MD on: 06/15/2017 12:59 PM  Actions taken: Sign clinical note

## 2017-06-20 ENCOUNTER — Telehealth: Payer: Self-pay | Admitting: Oncology

## 2017-06-20 ENCOUNTER — Inpatient Hospital Stay: Payer: Medicare Other | Attending: Oncology | Admitting: Oncology

## 2017-06-20 ENCOUNTER — Encounter: Payer: Self-pay | Admitting: Medical Oncology

## 2017-06-20 VITALS — BP 182/79 | HR 66 | Temp 98.2°F | Resp 18 | Ht 73.5 in | Wt 223.3 lb

## 2017-06-20 DIAGNOSIS — Z923 Personal history of irradiation: Secondary | ICD-10-CM

## 2017-06-20 DIAGNOSIS — I10 Essential (primary) hypertension: Secondary | ICD-10-CM

## 2017-06-20 DIAGNOSIS — C7951 Secondary malignant neoplasm of bone: Secondary | ICD-10-CM | POA: Diagnosis not present

## 2017-06-20 DIAGNOSIS — C61 Malignant neoplasm of prostate: Secondary | ICD-10-CM | POA: Diagnosis not present

## 2017-06-20 NOTE — Progress Notes (Signed)
Jorge [name] here today to consult with Dr. Benay Spice for 2nd opinion. He has had difficulty understanding the progression of his prostate cancer and treatment. He consulted with  Dr. Tammi Klippel 3/11 regarding bone mets and adenopathy. He was having pain in his left hip but this has subsided. He is currently receiving Lupron and tolerating well except for hot flashes in the evening. Dr. Benay Spice does not recommend any additional treatment such as Zytiga or Xtandi at this time. If he has  disease progression Dr. Benay Spice will address these therapies again. He is worried about he cost of these medications because he does not have drug cover. I discussed our oral pharmacy navigator and their roel.  Dr. Benay Spice prescribed  Caltrate D bid. I wrote this down for him and informed him he can buy over the counter. All questions were addressed and I asked him to call me with questions or concerns. He has follow up with Dr. Diona Fanti in May and will see Dr. Benay Spice in May.

## 2017-06-20 NOTE — Progress Notes (Signed)
[place] OFFICE PROGRESS NOTE   Diagnosis: Prostate cancer  INTERVAL HISTORY:   Mr. Kitchings is an 84 year old with a history of hormone refractory prostate cancer.  He is referred for a second opinion. Mr. Even was diagnosed with prostate cancer in 2008, Gleason 7, status post prostatectomy in June 2008. The PSA was elevated and he was treated with radiation and androgen deprivation therapy in 2011, radiation completed in March 2011 and last injection in July 2011. He developed a rising PSA beginning in October 2016.  He was noted to have periaortic and obturator lymphadenopathy.  Androgen deprivation was resumed and continues.  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  Restaging CT and bone scan 04/18/2017 revealed new foci of tracer activity in the bilateral pelvis, lower cervical spine, and L2.  There is new lymphadenopathy at the left iliac vessels, para-aortic, and retrocrural areas.  Mr. Trentman was referred to Dr. Tammi Klippel to consider palliative radiation after he reported "hip "discomfort.  He reports the pain resolved prior to seeing Dr. Tammi Klippel.  Dr. Tammi Klippel discussed Lurena Nida with Mr. Amis.  Mr. Sudol feels well at present.  No complaint.   Past medical history: 1.  Hypertension  Past surgical history: 1.  Appendectomy at age 56 2.  Hernia repair in 2013 3.  Prostatectomy in 2008 4.  Blepharoplasty in 2013  Family history: His father died of pancreas cancer.  A brother has prostate cancer.  No other family history of cancer.  No children.  Review of systems: Positives- hot flashes in the evening A complete review of systems was otherwise negative  Objective:  Vital signs in last 24 hours:  Blood pressure (!) 182/79, pulse 66, temperature 98.2 F (36.8 C), temperature source Oral, resp. rate 18, height 6' 1.5" (1.867 m), weight 223 lb 4.8 oz (101.3 kg), SpO2 98 %.    HEENT: Neck without mass Lymphatics: No cervical,  supraclavicular, axillary, or inguinal nodes Resp: Lungs clear bilaterally Cardio: Regular rate and rhythm GI: No hepatosplenomegaly, no mass, nontender Vascular: No leg edema Neuro: Alert and oriented, the motor exam appears intact in the upper and lower extremities Musculoskeletal: No pain with motion of the left hip.  No tenderness at the left iliac or left trochanter   Imaging: CT and bone scan from 04/18/2017-images reviewed with Mr. Parlett Medications: I have reviewed the patient's current medications.   Assessment/Plan: 1.  Prostate cancer, Gleason 7, status post a prostatectomy in 2008  Elevated PSA 2011, status post external beam radiation and androgen deprivation therapy, radiation completed March 2011, last injection July 2011  Elevated PSA October 2016, CT with periaortic and obturator lymphadenopathy  Androgen deprivation therapy resumed and continues, now on every 22-month Lupron  Bone scan 04/18/2017- new foci of metastatic disease at the right supra-acetabulum, left acetabulum, low cervical spine, and L2  CT 04/18/2017-left iliac, periaortic, and retrocrural lymphadenopathy  2.  Hypertension   Disposition: Mr. Lenzen has hormone refractory prostate cancer.  The PSA is rising.  He appears asymptomatic from the prostate cancer.  He appears to have low volume metastatic disease based on the bone scan and CTs in January of this year.  I discussed treatment options with Mr. Nettleton.  He understands no therapy will be curative.  We discussed systemic treatment options including Zytiga,Xtandi, and chemotherapy.  He also appears to be a candidate for Radium- 223.  Mr. Gedney is most comfortable with continued observation.  We will follow his clinical status and plan for  a restaging x-ray evaluation at a 9-65-month interval.  If he develops symptoms or evidence of rapid disease progression the plan is to initiate systemic hormonal therapy with Zytiga or Xtandi.  We  discussed the indication for bone building therapy in patients with metastatic prostate cancer and androgen deprivation therapy.  He appears to have low volume bone metastases at present.  He will begin a calcium and vitamin D supplement.  We will add biphosphonate or denosumab therapy if he develops progressive bone metastases.  Mr. Lunz will see Dr. Diona Fanti for another Lupron injection in April.  He will return for an office visit here in 4 months.  40 minutes were spent with the patient today.  The majority of the time was used for counseling and coordination of care.  Betsy Coder, MD  06/20/2017  2:35 PM

## 2017-06-20 NOTE — Telephone Encounter (Signed)
Appointments scheduled AVS/Calendar printed per 3/19 los °

## 2017-08-21 ENCOUNTER — Encounter: Payer: Self-pay | Admitting: Internal Medicine

## 2017-08-21 ENCOUNTER — Ambulatory Visit: Payer: Medicare Other | Admitting: Internal Medicine

## 2017-08-21 ENCOUNTER — Other Ambulatory Visit: Payer: Medicare Other

## 2017-08-21 DIAGNOSIS — M1990 Unspecified osteoarthritis, unspecified site: Secondary | ICD-10-CM | POA: Diagnosis not present

## 2017-08-21 DIAGNOSIS — D044 Carcinoma in situ of skin of scalp and neck: Secondary | ICD-10-CM

## 2017-08-21 DIAGNOSIS — C61 Malignant neoplasm of prostate: Secondary | ICD-10-CM

## 2017-08-21 DIAGNOSIS — I10 Essential (primary) hypertension: Secondary | ICD-10-CM

## 2017-08-21 DIAGNOSIS — D485 Neoplasm of uncertain behavior of skin: Secondary | ICD-10-CM

## 2017-08-21 NOTE — Progress Notes (Signed)
Subjective:  Patient ID: Jorge [name], male    DOB: [birth year]  Age: 84 y.o. MRN: [id number]  CC: No chief complaint on file.   HPI JUWON SCRIPTER presents for prostate Ca, HTN, OA f/u C/o spot on head  Outpatient Medications Prior to Visit  Medication Sig Dispense Refill  . acetaminophen (TYLENOL) 500 MG tablet Take 500 mg by mouth.    . Diphenhydramine-APAP, sleep, (TYLENOL PM EXTRA STRENGTH PO) Take by mouth.    [name] Calcium (STOOL SOFTENER PO) Take by mouth daily.    [name] Leuprolide Acetate (LUPRON IJ) Inject 0.2 mLs into the skin every 4 (four) months.    [name] losartan (COZAAR) 100 MG tablet Take 1 tablet (100 mg total) by mouth daily. 90 tablet 3   No facility-administered medications prior to visit.     ROS Review of Systems  Constitutional: Positive for unexpected weight change. Negative for appetite change and fatigue.  HENT: Negative for congestion, nosebleeds, sneezing, sore throat and trouble swallowing.   Eyes: Negative for itching and visual disturbance.  Respiratory: Negative for cough.   Cardiovascular: Negative for chest pain, palpitations and leg swelling.  Gastrointestinal: Negative for abdominal distention, blood in stool, diarrhea and nausea.  Genitourinary: Negative for frequency and hematuria.  Musculoskeletal: Positive for arthralgias, back pain and gait problem. Negative for joint swelling and neck pain.  Skin: Negative for rash.  Neurological: Negative for dizziness, tremors, speech difficulty and weakness.  Psychiatric/Behavioral: Negative for agitation, dysphoric mood and sleep disturbance. The patient is not nervous/anxious.     Objective:  BP (!) 152/84 (BP Location: Left Arm, Patient Position: Sitting, Cuff Size: Large)   Pulse 72   Temp 98 F (36.7 C) (Oral)   Ht 6' 1.5" (1.867 m)   Wt 216 lb (98 kg)   SpO2 98%   BMI 28.11 kg/m   BP Readings from Last 3 Encounters:  08/21/17 (!) 152/84  06/20/17 (!) 182/79  06/12/17 (!) 169/94      Wt Readings from Last 3 Encounters:  08/21/17 216 lb (98 kg)  06/20/17 223 lb 4.8 oz (101.3 kg)  06/12/17 221 lb 9.6 oz (100.5 kg)    Physical Exam  Constitutional: He is oriented to person, place, and time. He appears well-developed. No distress.  NAD  HENT:  Mouth/Throat: Oropharynx is clear and moist.  Eyes: Pupils are equal, round, and reactive to light. Conjunctivae are normal.  Neck: Normal range of motion. No JVD present. No thyromegaly present.  Cardiovascular: Normal rate, regular rhythm, normal heart sounds and intact distal pulses. Exam reveals no gallop and no friction rub.  No murmur heard. Pulmonary/Chest: Effort normal and breath sounds normal. No respiratory distress. He has no wheezes. He has no rales. He exhibits no tenderness.  Abdominal: Soft. Bowel sounds are normal. He exhibits no distension and no mass. There is no tenderness. There is no rebound and no guarding.  Musculoskeletal: Normal range of motion. He exhibits tenderness. He exhibits no edema.  Lymphadenopathy:    He has no cervical adenopathy.  Neurological: He is alert and oriented to person, place, and time. He has normal reflexes. No cranial nerve deficit. He exhibits normal muscle tone. He displays a negative Romberg sign. Coordination and gait normal.  Skin: Skin is warm and dry. No rash noted.  Psychiatric: He has a normal mood and affect. His behavior is normal. Judgment and thought content normal.   scalp11x6 mm hard scabbed lesion LS spine sensitive    Procedure  Note :     Procedure :  Skin biopsy   Indication:  Changing mole (s ),  Suspicious lesion(s)   Risks including unsuccessful procedure , bleeding, infection, bruising, scar, a need for another complete procedure and others were explained to the patient in detail as well as the benefits. Informed consent was obtained.  The patient was placed in a decubitus position.  Lesion #1 on  scalp   measuring  11x6   mm   Skin over  lesion #1  was prepped with Betadine and alcohol  and anesthetized with 1 cc of 2% lidocaine and epinephrine, using a 25-gauge 1 inch needle.  Shave biopsy with a sterile blade was carried out in the usual fashion. Hyfrecator was used to destroy the rest of the lesion potentially left behind and for hemostasis.  Tolerated well. Complications none.      Postprocedure instructions :   You can take a shower tomorrow.  Keep the wounds clean. You can wash them with liquid soap and water. Pat dry with gauze or a Kleenex tissue  Before applying antibiotic ointment .   You need to report immediately  if fever, chills or any signs of infection develop.    The biopsy results should be available in 1 -2 weeks.    Lab Results  Component Value Date   WBC 5.9 04/18/2017   HGB 14.1 04/18/2017   HCT 43.1 04/18/2017   PLT 206 04/18/2017   GLUCOSE 102 04/18/2017   CHOL 164 10/21/2015   TRIG 116.0 10/21/2015   HDL 52.20 10/21/2015   LDLCALC 88 10/21/2015   ALT 15 04/18/2017   AST 21 04/18/2017   NA 141 04/18/2017   K 4.5 04/18/2017   CL 109 04/18/2017   CREATININE 0.86 04/18/2017   BUN 11 04/18/2017   CO2 26 04/18/2017   TSH 1.26 10/21/2015   PSA 0.46 01/10/2007   INR 1.06 06/03/2011   HGBA1C 6.1 (H) 01/10/2007    No results found.  Assessment & Plan:   There are no diagnoses linked to this encounter. I am having Alexandar L. Belenda Cruise maintain his Docusate Calcium (STOOL SOFTENER PO), (Diphenhydramine-APAP, sleep, (TYLENOL PM EXTRA STRENGTH PO)), acetaminophen, Leuprolide Acetate (LUPRON IJ), and losartan.  No orders of the defined types were placed in this encounter.    Follow-up: No follow-ups on file.  [name], MD

## 2017-08-21 NOTE — Patient Instructions (Signed)
   Postprocedure instructions :   You can take a shower tomorrow.  Keep the wounds clean. You can wash them with liquid soap and water. Pat dry with gauze or a Kleenex tissue  Before applying antibiotic ointment .   You need to report immediately  if fever, chills or any signs of infection develop.    The biopsy results should be available in 1 -2 weeks.

## 2017-08-21 NOTE — Assessment & Plan Note (Signed)
See bx °

## 2017-08-21 NOTE — Addendum Note (Signed)
Addended by: Karren Cobble on: 08/21/2017 11:38 AM   Modules accepted: Orders

## 2017-08-21 NOTE — Addendum Note (Signed)
Addended by: Karren Cobble on: 08/21/2017 11:54 AM   Modules accepted: Orders

## 2017-08-21 NOTE — Assessment & Plan Note (Signed)
F/u Dr Benay Spice

## 2017-09-12 DIAGNOSIS — M1990 Unspecified osteoarthritis, unspecified site: Secondary | ICD-10-CM | POA: Insufficient documentation

## 2017-09-12 NOTE — Assessment & Plan Note (Signed)
DJD, multiple sites Vit D Tylenol prn

## 2017-09-12 NOTE — Assessment & Plan Note (Signed)
Losartan and Hytrin

## 2017-09-16 ENCOUNTER — Emergency Department (HOSPITAL_COMMUNITY): Payer: Medicare Other

## 2017-09-16 ENCOUNTER — Encounter (HOSPITAL_COMMUNITY): Payer: Self-pay | Admitting: *Deleted

## 2017-09-16 ENCOUNTER — Other Ambulatory Visit: Payer: Self-pay

## 2017-09-16 ENCOUNTER — Inpatient Hospital Stay (HOSPITAL_COMMUNITY)
Admission: EM | Admit: 2017-09-16 | Discharge: 2017-09-23 | DRG: 327 | Disposition: A | Discharge status: Home / Self Care | Payer: Medicare Other | Attending: Surgery | Admitting: Surgery

## 2017-09-16 ENCOUNTER — Encounter (HOSPITAL_COMMUNITY): Admission: EM | Disposition: A | Discharge status: Home / Self Care | Payer: Self-pay | Source: Home / Self Care

## 2017-09-16 DIAGNOSIS — K660 Peritoneal adhesions (postprocedural) (postinfection): Secondary | ICD-10-CM | POA: Diagnosis present

## 2017-09-16 DIAGNOSIS — C61 Malignant neoplasm of prostate: Secondary | ICD-10-CM | POA: Diagnosis present

## 2017-09-16 DIAGNOSIS — R933 Abnormal findings on diagnostic imaging of other parts of digestive tract: Secondary | ICD-10-CM | POA: Diagnosis present

## 2017-09-16 DIAGNOSIS — G8918 Other acute postprocedural pain: Secondary | ICD-10-CM | POA: Diagnosis not present

## 2017-09-16 DIAGNOSIS — Z86010 Personal history of colonic polyps: Secondary | ICD-10-CM

## 2017-09-16 DIAGNOSIS — I10 Essential (primary) hypertension: Secondary | ICD-10-CM | POA: Diagnosis present

## 2017-09-16 DIAGNOSIS — C7951 Secondary malignant neoplasm of bone: Secondary | ICD-10-CM | POA: Diagnosis present

## 2017-09-16 DIAGNOSIS — K222 Esophageal obstruction: Secondary | ICD-10-CM | POA: Diagnosis not present

## 2017-09-16 DIAGNOSIS — D72829 Elevated white blood cell count, unspecified: Secondary | ICD-10-CM | POA: Diagnosis present

## 2017-09-16 DIAGNOSIS — G4700 Insomnia, unspecified: Secondary | ICD-10-CM | POA: Diagnosis present

## 2017-09-16 DIAGNOSIS — K315 Obstruction of duodenum: Secondary | ICD-10-CM | POA: Diagnosis present

## 2017-09-16 DIAGNOSIS — R590 Localized enlarged lymph nodes: Secondary | ICD-10-CM | POA: Diagnosis present

## 2017-09-16 DIAGNOSIS — Q398 Other congenital malformations of esophagus: Secondary | ICD-10-CM

## 2017-09-16 DIAGNOSIS — E559 Vitamin D deficiency, unspecified: Secondary | ICD-10-CM | POA: Diagnosis present

## 2017-09-16 DIAGNOSIS — K440 Diaphragmatic hernia with obstruction, without gangrene: Secondary | ICD-10-CM | POA: Diagnosis not present

## 2017-09-16 DIAGNOSIS — R32 Unspecified urinary incontinence: Secondary | ICD-10-CM | POA: Diagnosis not present

## 2017-09-16 DIAGNOSIS — K219 Gastro-esophageal reflux disease without esophagitis: Secondary | ICD-10-CM | POA: Diagnosis not present

## 2017-09-16 DIAGNOSIS — K450 Other specified abdominal hernia with obstruction, without gangrene: Secondary | ICD-10-CM

## 2017-09-16 DIAGNOSIS — Z79899 Other long term (current) drug therapy: Secondary | ICD-10-CM

## 2017-09-16 DIAGNOSIS — G709 Myoneural disorder, unspecified: Secondary | ICD-10-CM | POA: Diagnosis not present

## 2017-09-16 DIAGNOSIS — Z881 Allergy status to other antibiotic agents status: Secondary | ICD-10-CM

## 2017-09-16 DIAGNOSIS — I700 Atherosclerosis of aorta: Secondary | ICD-10-CM | POA: Diagnosis not present

## 2017-09-16 DIAGNOSIS — Z87891 Personal history of nicotine dependence: Secondary | ICD-10-CM

## 2017-09-16 DIAGNOSIS — Z79818 Long term (current) use of other agents affecting estrogen receptors and estrogen levels: Secondary | ICD-10-CM

## 2017-09-16 DIAGNOSIS — K5730 Diverticulosis of large intestine without perforation or abscess without bleeding: Secondary | ICD-10-CM | POA: Diagnosis present

## 2017-09-16 DIAGNOSIS — K449 Diaphragmatic hernia without obstruction or gangrene: Secondary | ICD-10-CM | POA: Diagnosis not present

## 2017-09-16 DIAGNOSIS — R000 Tachycardia, unspecified: Secondary | ICD-10-CM | POA: Diagnosis present

## 2017-09-16 DIAGNOSIS — K311 Adult hypertrophic pyloric stenosis: Secondary | ICD-10-CM | POA: Diagnosis present

## 2017-09-16 DIAGNOSIS — K3189 Other diseases of stomach and duodenum: Secondary | ICD-10-CM | POA: Diagnosis present

## 2017-09-16 DIAGNOSIS — K460 Unspecified abdominal hernia with obstruction, without gangrene: Secondary | ICD-10-CM | POA: Diagnosis not present

## 2017-09-16 DIAGNOSIS — Z8711 Personal history of peptic ulcer disease: Secondary | ICD-10-CM

## 2017-09-16 DIAGNOSIS — R112 Nausea with vomiting, unspecified: Secondary | ICD-10-CM

## 2017-09-16 DIAGNOSIS — Z91041 Radiographic dye allergy status: Secondary | ICD-10-CM

## 2017-09-16 DIAGNOSIS — Z9079 Acquired absence of other genital organ(s): Secondary | ICD-10-CM

## 2017-09-16 DIAGNOSIS — Z4682 Encounter for fitting and adjustment of non-vascular catheter: Secondary | ICD-10-CM | POA: Diagnosis not present

## 2017-09-16 DIAGNOSIS — Z4659 Encounter for fitting and adjustment of other gastrointestinal appliance and device: Secondary | ICD-10-CM | POA: Diagnosis not present

## 2017-09-16 DIAGNOSIS — Z9049 Acquired absence of other specified parts of digestive tract: Secondary | ICD-10-CM

## 2017-09-16 DIAGNOSIS — Z431 Encounter for attention to gastrostomy: Secondary | ICD-10-CM | POA: Diagnosis not present

## 2017-09-16 DIAGNOSIS — Z883 Allergy status to other anti-infective agents status: Secondary | ICD-10-CM

## 2017-09-16 DIAGNOSIS — Z923 Personal history of irradiation: Secondary | ICD-10-CM

## 2017-09-16 DIAGNOSIS — Z886 Allergy status to analgesic agent status: Secondary | ICD-10-CM

## 2017-09-16 DIAGNOSIS — M1990 Unspecified osteoarthritis, unspecified site: Secondary | ICD-10-CM | POA: Diagnosis present

## 2017-09-16 DIAGNOSIS — R51 Headache: Secondary | ICD-10-CM | POA: Diagnosis present

## 2017-09-16 DIAGNOSIS — Z8546 Personal history of malignant neoplasm of prostate: Secondary | ICD-10-CM

## 2017-09-16 DIAGNOSIS — K56609 Unspecified intestinal obstruction, unspecified as to partial versus complete obstruction: Secondary | ICD-10-CM | POA: Diagnosis present

## 2017-09-16 HISTORY — PX: ESOPHAGOGASTRODUODENOSCOPY: SHX5428

## 2017-09-16 LAB — COMPREHENSIVE METABOLIC PANEL
ALT: 16 U/L — ABNORMAL LOW (ref 17–63)
AST: 35 U/L (ref 15–41)
Albumin: 4.3 g/dL (ref 3.5–5.0)
Alkaline Phosphatase: 180 U/L — ABNORMAL HIGH (ref 38–126)
Anion gap: 13 (ref 5–15)
BILIRUBIN TOTAL: 0.8 mg/dL (ref 0.3–1.2)
BUN: 21 mg/dL — AB (ref 6–20)
CO2: 24 mmol/L (ref 22–32)
Calcium: 9.9 mg/dL (ref 8.9–10.3)
Chloride: 104 mmol/L (ref 101–111)
Creatinine, Ser: 0.99 mg/dL (ref 0.61–1.24)
GFR calc non Af Amer: >60 mL/min (ref >60)
Glucose, Bld: 156 mg/dL — ABNORMAL HIGH (ref 65–99)
POTASSIUM: 3.8 mmol/L (ref 3.5–5.1)
Sodium: 141 mmol/L (ref 135–145)
Total Protein: 7.8 g/dL (ref 6.5–8.1)

## 2017-09-16 LAB — I-STAT TROPONIN, ED: Troponin i, poc: 0.01 ng/mL (ref 0.00–0.08)

## 2017-09-16 LAB — URINALYSIS, ROUTINE W REFLEX MICROSCOPIC
BACTERIA UA: NONE SEEN
BILIRUBIN URINE: NEGATIVE
Glucose, UA: NEGATIVE mg/dL
KETONES UR: 20 mg/dL — AB
Leukocytes, UA: NEGATIVE
Nitrite: NEGATIVE
PROTEIN: 100 mg/dL — AB
Specific Gravity, Urine: 1.026 (ref 1.005–1.030)
pH: 6 (ref 5.0–8.0)

## 2017-09-16 LAB — CBC WITH DIFFERENTIAL/PLATELET
BASOS ABS: 0 10*3/uL (ref 0.0–0.1)
Basophils Relative: 0 %
EOS PCT: 0 %
Eosinophils Absolute: 0 10*3/uL (ref 0.0–0.7)
HEMATOCRIT: 47.6 % (ref 39.0–52.0)
Hemoglobin: 16 g/dL (ref 13.0–17.0)
Lymphocytes Relative: 7 %
Lymphs Abs: 1 10*3/uL (ref 0.7–4.0)
MCH: 32.6 pg (ref 26.0–34.0)
MCHC: 33.6 g/dL (ref 30.0–36.0)
MCV: 96.9 fL (ref 78.0–100.0)
MONO ABS: 0.9 10*3/uL (ref 0.1–1.0)
Monocytes Relative: 6 %
NEUTROS ABS: 12.7 10*3/uL — AB (ref 1.7–7.7)
Neutrophils Relative %: 87 %
PLATELETS: 243 10*3/uL (ref 150–400)
RBC: 4.91 MIL/uL (ref 4.22–5.81)
RDW: 13.2 % (ref 11.5–15.5)
WBC: 14.6 10*3/uL — AB (ref 4.0–10.5)

## 2017-09-16 LAB — LIPASE, BLOOD: Lipase: 31 U/L (ref 11–51)

## 2017-09-16 SURGERY — EGD (ESOPHAGOGASTRODUODENOSCOPY)
Anesthesia: Moderate Sedation

## 2017-09-16 MED ORDER — ACETAMINOPHEN 650 MG RE SUPP: 650.0000 mg | Freq: Four times a day (QID) | RECTAL | Status: DC | PRN

## 2017-09-16 MED ORDER — MORPHINE SULFATE (PF) 2 MG/ML IV SOLN
1.0000 mg | INTRAVENOUS | Status: DC | PRN
  Administered 2017-09-18: 2 mg via INTRAVENOUS
  Administered 2017-09-19: 1 mg via INTRAVENOUS
  Administered 2017-09-19: 2 mg via INTRAVENOUS
  Filled 2017-09-16 (×2): qty 1
  Filled 2017-09-16: qty 2

## 2017-09-16 MED ORDER — SODIUM CHLORIDE 0.9 % IV SOLN: INTRAVENOUS | Status: DC

## 2017-09-16 MED ORDER — LORAZEPAM 2 MG/ML IJ SOLN
0.5000 mg | Freq: Once | INTRAMUSCULAR | Status: AC
  Administered 2017-09-16: 0.5 mg via INTRAVENOUS
  Filled 2017-09-16: qty 1

## 2017-09-16 MED ORDER — ORAL CARE MOUTH RINSE
15.0000 mL | Freq: Two times a day (BID) | OROMUCOSAL | Status: DC
  Administered 2017-09-17 – 2017-09-19 (×5): 15 mL via OROMUCOSAL

## 2017-09-16 MED ORDER — SODIUM CHLORIDE 0.9 % IV BOLUS
500.0000 mL | Freq: Once | INTRAVENOUS | Status: AC
  Administered 2017-09-16: 500 mL via INTRAVENOUS

## 2017-09-16 MED ORDER — BARIUM SULFATE 2 % PO SUSP
450.0000 mL | Freq: Once | ORAL | Status: AC
  Administered 2017-09-16: 450 mL via ORAL

## 2017-09-16 MED ORDER — MIDAZOLAM HCL 5 MG/ML IJ SOLN
INTRAMUSCULAR | Status: AC
  Filled 2017-09-16: qty 2

## 2017-09-16 MED ORDER — ONDANSETRON HCL 4 MG/2ML IJ SOLN: 4.0000 mg | Freq: Four times a day (QID) | INTRAMUSCULAR | Status: DC | PRN

## 2017-09-16 MED ORDER — LIDOCAINE HCL (PF) 1 % IJ SOLN
5.0000 mL | Freq: Once | INTRAMUSCULAR | Status: AC
  Administered 2017-09-16: 5 mL
  Filled 2017-09-16: qty 30

## 2017-09-16 MED ORDER — IOPAMIDOL (ISOVUE-300) INJECTION 61%
50.0000 mL | Freq: Once | INTRAVENOUS | Status: AC | PRN
  Administered 2017-09-16: 50 mL via ORAL

## 2017-09-16 MED ORDER — MUPIROCIN 2 % EX OINT
1.0000 "application " | TOPICAL_OINTMENT | Freq: Two times a day (BID) | CUTANEOUS | Status: DC
  Administered 2017-09-16 – 2017-09-17 (×2): 1 via NASAL
  Filled 2017-09-16: qty 22

## 2017-09-16 MED ORDER — FENTANYL CITRATE (PF) 100 MCG/2ML IJ SOLN
INTRAMUSCULAR | Status: DC | PRN
  Administered 2017-09-16 (×2): 25 ug via INTRAVENOUS

## 2017-09-16 MED ORDER — KCL IN DEXTROSE-NACL 20-5-0.45 MEQ/L-%-% IV SOLN
INTRAVENOUS | Status: DC
  Administered 2017-09-16 – 2017-09-23 (×10): via INTRAVENOUS
  Filled 2017-09-16 (×12): qty 1000

## 2017-09-16 MED ORDER — DIPHENHYDRAMINE HCL 50 MG/ML IJ SOLN
INTRAMUSCULAR | Status: AC
  Filled 2017-09-16: qty 1

## 2017-09-16 MED ORDER — ACETAMINOPHEN 325 MG PO TABS: 650.0000 mg | ORAL_TABLET | Freq: Four times a day (QID) | ORAL | Status: DC | PRN

## 2017-09-16 MED ORDER — FENTANYL CITRATE (PF) 100 MCG/2ML IJ SOLN
INTRAMUSCULAR | Status: AC
  Filled 2017-09-16: qty 2

## 2017-09-16 MED ORDER — CHLORHEXIDINE GLUCONATE 0.12 % MT SOLN
15.0000 mL | Freq: Two times a day (BID) | OROMUCOSAL | Status: DC
  Administered 2017-09-16 – 2017-09-22 (×12): 15 mL via OROMUCOSAL
  Filled 2017-09-16 (×12): qty 15

## 2017-09-16 MED ORDER — MIDAZOLAM HCL 10 MG/2ML IJ SOLN
INTRAMUSCULAR | Status: DC | PRN
  Administered 2017-09-16 (×4): 1 mg via INTRAVENOUS

## 2017-09-16 MED ORDER — LIDOCAINE HCL URETHRAL/MUCOSAL 2 % EX GEL
1.0000 "application " | Freq: Once | CUTANEOUS | Status: AC | PRN
  Administered 2017-09-16: 1 via URETHRAL
  Filled 2017-09-16: qty 5

## 2017-09-16 MED ORDER — MORPHINE SULFATE (PF) 4 MG/ML IV SOLN
4.0000 mg | Freq: Once | INTRAVENOUS | Status: AC
  Administered 2017-09-16: 4 mg via INTRAVENOUS
  Filled 2017-09-16: qty 1

## 2017-09-16 MED ORDER — MORPHINE SULFATE (PF) 2 MG/ML IV SOLN
2.0000 mg | Freq: Once | INTRAVENOUS | Status: AC
  Administered 2017-09-16: 2 mg via INTRAVENOUS
  Filled 2017-09-16: qty 1

## 2017-09-16 MED ORDER — FAMOTIDINE IN NACL 20-0.9 MG/50ML-% IV SOLN
20.0000 mg | Freq: Two times a day (BID) | INTRAVENOUS | Status: DC
  Administered 2017-09-16 – 2017-09-22 (×12): 20 mg via INTRAVENOUS
  Filled 2017-09-16 (×12): qty 50

## 2017-09-16 MED ORDER — ONDANSETRON 4 MG PO TBDP: 4.0000 mg | ORAL_TABLET | Freq: Four times a day (QID) | ORAL | Status: DC | PRN

## 2017-09-16 MED ORDER — ONDANSETRON HCL 4 MG/2ML IJ SOLN
4.0000 mg | Freq: Once | INTRAMUSCULAR | Status: AC
  Administered 2017-09-16: 4 mg via INTRAVENOUS
  Filled 2017-09-16: qty 2

## 2017-09-16 NOTE — ED Notes (Signed)
Surgery MD has been by to see pt.

## 2017-09-16 NOTE — H&P (Signed)
Jorge [name] is an 84 y.o. male.    General Surgery [facility]  Chief Complaint: nausea, emesis / headache / abdominal pain  HPI: Patient is an 84 year old white male who presents to the emergency department with 24-hour history of nausea, emesis, headache, and abdominal pain.  Patient had eaten lunch yesterday.  After lunch she developed abdominal discomfort followed by onset of nausea and vomiting.  He had had a normal bowel movement in the morning.  Patient first thought this might be food poisoning but symptoms persisted overnight.  Patient presented to the emergency department for evaluation.  Patient was found to be tachycardic.  Laboratory studies showed a mildly elevated white blood cell count.  Chest x-ray was obtained followed by a CT scan.  This demonstrated a recurrent hiatal hernia with stomach markedly distended and extending into the left chest with obstruction of the second portion of the duodenum.  Patient has a history of hiatal hernia repair performed by Dr. Kaylyn [name] in 2013.  He has not had other abdominal surgery.  He does have a history of prostate cancer treated with radiation.  General surgery is asked to evaluate and manage.  Past Medical History:  Diagnosis Date  . Anxiety   . ED (erectile dysfunction)   . Gastric ulcer    Dr. Ardis [name]  . GERD (gastroesophageal reflux disease)   . Hernia, hiatal   . History of colonic polyps   . Hx of radiation therapy   . Hypertension   . Insomnia   . Prostate cancer [facility]) 2010   Dr. Luberta [name]  . Syncope 2011   from Bicalutamide  . Vitamin D deficiency     Past Surgical History:  Procedure Laterality Date  . APPENDECTOMY     age 37  . CYST REMOVAL NECK N/A 10/16/2014   Procedure: EXCISION CYST POSTERIOR NECK;  Surgeon: Jorge Messing III, MD;  Location: [place];  Service: General;  Laterality: N/A;  posterior  . ESOPHAGOGASTRODUODENOSCOPY  06/05/2011   Procedure:  ESOPHAGOGASTRODUODENOSCOPY (EGD);  Surgeon: Jorge Mayer, MD;  Location: [facility] ENDOSCOPY;  Service: Endoscopy;  Laterality: N/A;  . HERNIA REPAIR  06/10/2011      . HIATAL HERNIA REPAIR  06/10/2011   Procedure: LAPAROSCOPIC REPAIR OF HIATAL HERNIA;  Surgeon: Jorge Earls, MD;  Location: [place];  Service: General;  Laterality: N/A;  Laparoscopic repair of paraesophageal hernia.  [name] PROSTATECTOMY  2008  . TRANSURETHRAL RESECTION OF PROSTATE      Family History  Problem Relation Age of Onset  . Pancreatic cancer Father   . Hypertension Father   . Cancer Father   . Hypertension Other    Social History:  reports that he has quit smoking. He has never used smokeless tobacco. He reports that he does not drink alcohol or use drugs.  Allergies:  Allergies  Allergen Reactions  . Aspirin Other (See Comments)    Ulcerated stomach  . Azithromycin Shortness Of Breath  . Cefuroxime Axetil Shortness Of Breath  . Iodinated Diagnostic Agents Anaphylaxis  . Iodine Other (See Comments)    Per pt tremors and dyspnea  . Other Shortness Of Breath    Twist in esophagus so patient can not take large pills/capsules  . Naproxen Other (See Comments)    REACTION: upset stomach - like with other NSAIDs     (Not in a hospital admission)  Results for orders placed or performed during the hospital encounter of 09/16/17 (from the past  48 hour(s))  Comprehensive metabolic panel     Status: Abnormal   Collection Time: 09/16/17  1:11 PM  Result Value Ref Range   Sodium 141 135 - 145 mmol/L   Potassium 3.8 3.5 - 5.1 mmol/L   Chloride 104 101 - 111 mmol/L   CO2 24 22 - 32 mmol/L   Glucose, Bld 156 (H) 65 - 99 mg/dL   BUN 21 (H) 6 - 20 mg/dL   Creatinine, Ser 0.99 0.61 - 1.24 mg/dL   Calcium 9.9 8.9 - 10.3 mg/dL   Total Protein 7.8 6.5 - 8.1 g/dL   Albumin 4.3 3.5 - 5.0 g/dL   AST 35 15 - 41 U/L   ALT 16 (L) 17 - 63 U/L   Alkaline Phosphatase 180 (H) 38 - 126 U/L   Total Bilirubin 0.8 0.3 - 1.2 mg/dL    GFR calc non Af Amer >60 >60 mL/min   GFR calc Af Amer >60 >60 mL/min    Comment: (NOTE) The eGFR has been calculated using the CKD EPI equation. This calculation has not been validated in all clinical situations. eGFR's persistently <60 mL/min signify possible Chronic Kidney Disease.    Anion gap 13 5 - 15    Comment: Performed at [facility], [place] [address] [place], [place] [address]  Lipase, blood     Status: None   Collection Time: 09/16/17  1:11 PM  Result Value Ref Range   Lipase 31 11 - 51 U/L    Comment: Performed at [facility], [place] [address], [place], [place] [address]  CBC with Differential     Status: Abnormal   Collection Time: 09/16/17  1:11 PM  Result Value Ref Range   WBC 14.6 (H) 4.0 - 10.5 K/uL   RBC 4.91 4.22 - 5.81 MIL/uL   Hemoglobin 16.0 13.0 - 17.0 g/dL   HCT 47.6 39.0 - 52.0 %   MCV 96.9 78.0 - 100.0 fL   MCH 32.6 26.0 - 34.0 pg   MCHC 33.6 30.0 - 36.0 g/dL   RDW 13.2 11.5 - 15.5 %   Platelets 243 150 - 400 K/uL   Neutrophils Relative % 87 %   Neutro Abs 12.7 (H) 1.7 - 7.7 K/uL   Lymphocytes Relative 7 %   Lymphs Abs 1.0 0.7 - 4.0 K/uL   Monocytes Relative 6 %   Monocytes Absolute 0.9 0.1 - 1.0 K/uL   Eosinophils Relative 0 %   Eosinophils Absolute 0.0 0.0 - 0.7 K/uL   Basophils Relative 0 %   Basophils Absolute 0.0 0.0 - 0.1 K/uL    Comment: Performed at [facility], [place] [address], [place], [place] [address]  Urinalysis, Routine w reflex microscopic     Status: Abnormal   Collection Time: 09/16/17  1:11 PM  Result Value Ref Range   Color, Urine YELLOW YELLOW   APPearance CLEAR CLEAR   Specific Gravity, Urine 1.026 1.005 - 1.030   pH 6.0 5.0 - 8.0   Glucose, UA NEGATIVE NEGATIVE mg/dL   Hgb urine dipstick SMALL (A) NEGATIVE   Bilirubin Urine NEGATIVE NEGATIVE   Ketones, ur 20 (A) NEGATIVE mg/dL   Protein, ur 100 (A) NEGATIVE mg/dL   Nitrite NEGATIVE NEGATIVE   Leukocytes, UA  NEGATIVE NEGATIVE   RBC / HPF 6-10 0 - 5 RBC/hpf   WBC, UA 0-5 0 - 5 WBC/hpf   Bacteria, UA NONE SEEN NONE SEEN   Squamous Epithelial / LPF 0-5 0 -  5   Mucus PRESENT     Comment: Performed at [facility], [place] [address], [place], [place] [address]  I-stat troponin, ED     Status: None   Collection Time: 09/16/17  1:16 PM  Result Value Ref Range   Troponin i, poc 0.01 0.00 - 0.08 ng/mL   Comment 3            Comment: Due to the release kinetics of cTnI, a negative result within the first hours of the onset of symptoms does not rule out myocardial infarction with certainty. If myocardial infarction is still suspected, repeat the test at appropriate intervals.    Ct Abdomen Pelvis Wo Contrast  Result Date: 09/16/2017 CLINICAL DATA:  Abdominal pain, headache, emesis since yesterday. History of hernia repair. EXAM: CT ABDOMEN AND PELVIS WITHOUT CONTRAST TECHNIQUE: Multidetector CT imaging of the abdomen and pelvis was performed following the standard protocol without IV contrast. COMPARISON:  CT abdomen dated 04/18/2017. FINDINGS: Lower chest: Elevation of the LEFT hemidiaphragm, likely chronic, with associated overlying atelectasis. Milder atelectasis/scarring at the RIGHT lung base. Prominent hiatal hernia with tortuosity of the stomach within the hernia sac. Stomach is prominently distended with associated air-fluid levels. Hepatobiliary: No focal liver abnormality is seen. No gallstones, gallbladder wall thickening, or biliary dilatation. Pancreas: Partially infiltrated with fat but otherwise unremarkable. The pancreatic head and body are displaced rightward by a suspected internal hernia. Spleen: Normal in size without focal abnormality. Adrenals/Urinary Tract: Adrenal glands appear normal. Kidneys are unremarkable without mass, stone or hydronephrosis. No ureteral or bladder calculi identified. Bladder appears normal, partially decompressed. Stomach/Bowel: Majority of the  small and large bowel is decompressed. The prominent hiatal hernia also includes a portion of the transverse colon and upper LEFT colon. There is scattered mild diverticulosis without evidence of acute diverticulitis. As above, the stomach is significantly distended, with associated air-fluid levels. There is an abrupt transition zone at the level of the proximal duodenum, probable mechanical obstruction. Swirling of the RIGHT upper quadrant mesentery raises the possibility of internal hernia as the cause of the apparent obstruction. Vascular/Lymphatic: Aortic atherosclerosis. No enlarged abdominal or pelvic lymph nodes. Reproductive: Prostate is unremarkable. Other: No free fluid or abscess collections seen. No free intraperitoneal air. Musculoskeletal: Advanced degenerative change throughout the thoracolumbar spine and at the LEFT hip. Probable ankylosing spondylitis of the thoracic spine. No acute or suspicious osseous finding. IMPRESSION: 1. Large hiatal hernia containing the majority of the stomach and a portion of the colon. Stomach is prominently distended, with associated air-fluid levels, and with a tortuous configuration. There is abrupt transition to a decompressed proximal descending duodenum, with swirling of the surrounding RIGHT upper quadrant mesentery raising the possibility of internal hernia causing the apparent mechanical bowel obstruction. Remainder of the small and large bowel is relatively decompressed. 2. Colonic diverticulosis without evidence of acute diverticulitis. 3. Aortic atherosclerosis. 4. Additional chronic/incidental findings detailed above These results were called by telephone at the time of interpretation on 09/16/2017 at 3:50 pm to Dr. Nanda Quinton , who verbally acknowledged these results. Electronically Signed   By: Franki Cabot M.D.   On: 09/16/2017 15:55   Dg Abdomen 1 View  Result Date: 09/16/2017 CLINICAL DATA:  Gastric tube placement EXAM: ABDOMEN - 1 VIEW COMPARISON:   None. FINDINGS: Targeted study of the left upper quadrant of the abdomen was provided. Malpositioned gastric tube is noted with tip projecting over the medial right lung base possibly along the right mainstem bronchus. Cardiomegaly with  small left effusion is present. There appears be gastric distention with air. Thoracic and upper lumbar syndesmophytes are noted with osteophytes of the mid to lower included lumbar spine. IMPRESSION: 1. Malpositioned gastric tube projecting over the medial right lung base, possibly within right mainstem bronchus. Repositioning is recommended by withdrawing and reinserting. 2. Cardiomegaly with small left effusion. 3. Gastric distention is suggested as well Electronically Signed   By: Ashley Royalty M.D.   On: 09/16/2017 17:39   Dg Abdomen Acute W/chest  Result Date: 09/16/2017 CLINICAL DATA:  Distended abdomen. EXAM: DG ABDOMEN ACUTE W/ 1V CHEST COMPARISON:  CT 04/18/2017 FINDINGS: Large left diaphragmatic hernia is stable dating back to prior CT. Air-fluid level in the left lower chest likely within the stomach. Suspect mild cardiomegaly although the heart is difficult to visualize. No focal airspace opacities or effusions. Nonobstructive bowel gas pattern. No organomegaly or suspicious calcification. No acute bony abnormality. IMPRESSION: Large left diaphragmatic hernia again noted as seen on prior CTs. No obstruction or free air. No acute cardiopulmonary disease. Electronically Signed   By: Rolm Baptise M.D.   On: 09/16/2017 14:27    Review of Systems  Constitutional: Negative for chills, diaphoresis and fever.  HENT: Negative.   Eyes: Negative.   Respiratory: Negative.   Cardiovascular: Negative.   Gastrointestinal: Positive for abdominal pain, nausea and vomiting. Negative for constipation and diarrhea.  Genitourinary: Negative.   Musculoskeletal: Negative.   Skin: Negative.   Neurological: Negative.   Endo/Heme/Allergies: Negative.   Psychiatric/Behavioral:  Negative.     Blood pressure (!) 172/102, pulse (!) 102, temperature 98.6 F (37 C), temperature source Oral, resp. rate (!) 24, SpO2 93 %. Physical Exam  Constitutional: He is oriented to person, place, and time. He appears well-developed and well-nourished. No distress.  HENT:  Head: Normocephalic and atraumatic.  Right Ear: External ear normal.  Left Ear: External ear normal.  Mouth/Throat: No oropharyngeal exudate.  Eyes: Pupils are equal, round, and reactive to light. Conjunctivae are normal. No scleral icterus.  Neck: Normal range of motion. Neck supple. No tracheal deviation present. No thyromegaly present.  Cardiovascular: Regular rhythm.  No murmur heard. Mild tachycardia  Respiratory: Effort normal and breath sounds normal. No respiratory distress. He has no wheezes.  GI: Soft. He exhibits distension (mild). He exhibits no mass. There is no tenderness. There is no rebound and no guarding.  Musculoskeletal: Normal range of motion. He exhibits no edema or deformity.  Neurological: He is alert and oriented to person, place, and time.  Skin: Skin is warm and dry. He is not diaphoretic.  Psychiatric: He has a normal mood and affect. His behavior is normal.     Assessment/Plan Recurrent hiatal hernia with duodenal obstruction, rule out gastric volvulus  NG placed in ER appears to be in right mainstem bronchus  Radiology contacted and will place NG with fluoroscopic guidance now  Admit to general surgery to telemetry bed  Will review with Dr. Kaylyn [name]  Monitor patient for signs of clinical deterioration - at this point appears stable without evidence of bowel ischemia  Armandina Gemma, [facility] Office: [phone]    Earnstine Regal, MD 09/16/2017, 6:15 PM

## 2017-09-16 NOTE — ED Notes (Signed)
Bed: WA01 Expected date:  Expected time:  Means of arrival:  Comments: 

## 2017-09-16 NOTE — Op Note (Signed)
[facility] Patient Name: Jorge [name] Procedure Date: 09/16/2017 MRN: [id number] Attending MD: Carol Ada , MD Date of Birth: 10/19/1929 CSN: [id number] Age: 84 Admit Type: Emergency Department Procedure:                Upper GI endoscopy Indications:              Abnormal CT of the GI tract Providers:                Carol Ada, MD, Elna Breslow, RN, Charolette Child,                            Technician Referring MD:              Medicines:                Midazolam 4 mg IV, Fentanyl 50 micrograms IV Complications:            No immediate complications. Estimated Blood Loss:     Estimated blood loss was minimal. Procedure:                Pre-Anesthesia Assessment:                           - Prior to the procedure, a History and Physical                            was performed, and patient medications and                            allergies were reviewed. The patient's tolerance of                            previous anesthesia was also reviewed. The risks                            and benefits of the procedure and the sedation                            options and risks were discussed with the patient.                            All questions were answered, and informed consent                            was obtained. Prior Anticoagulants: The patient has                            taken no previous anticoagulant or antiplatelet                            agents. ASA Grade Assessment: III - A patient with                            severe systemic disease. After reviewing the risks  and benefits, the patient was deemed in                            satisfactory condition to undergo the procedure.                           - Sedation was administered by an endoscopy nurse.                            The sedation level attained was moderate.                           After obtaining informed consent, the endoscope was              passed under direct vision. Throughout the                            procedure, the patient's blood pressure, pulse, and                            oxygen saturations were monitored continuously. The                            [id number] ([id number]) scope was introduced through the                            mouth, and advanced to the second part of duodenum.                            The upper GI endoscopy was technically difficult                            and complex due to abnormal anatomy. The patient                            tolerated the procedure well. Scope In: Scope Out: Findings:      The mid esophagus and distal esophagus were significantly tortuous.      A 30-32 cm hiatal hernia was present.      One benign-appearing, intrinsic moderate stenosis was found. This       stenosis measured 9 mm (inner diameter) x less than one cm (in length).       The stenosis was traversed.      A deformity was found in the gastric body.      The examined duodenum was normal.      The procedure was very difficult to perform. Entry into the esophagus       was normal, but in the mid to distal esophagus blood was encountered. It       was clear that the bleeding was from the prior attempts with the NG tube       placement. There were multiple sharp angulations in the distal esophagus       and this prevented the prior attempts with NG tube placement. The       endoscope was advanced to the GE junction and a Schatzki's ring was  encountered. The endoscope was not able to pass through the area.       Distally a very dilated and fluid filled gastric lumen was found.       Suctioning of the gastric contents from the GE junction cleared the       gastric lumen. A total of 3.1 liters of fluid and semi-solid gastric       contents were evacuated. With gentle maneuvering the endoscope was able       to pass through the Schatzki's ring, which in turn dilated the area. The       stomach  was very distorted, but ultimately the pylorus was identified       and then the duodenum was intubated. Air insufflation straightened out       the stomach and it was clear that the hiatal hernia extended from the       antrum up to the GE junction, which was approximately 30-32 cm. A 16 Fr       NG tube was inserted in the right nares and endoscopically identified in       the distal esophagus. Attempts with a Rat-toothed forcep and snare       failed to capture the NG tube to allow for guidance through the       torturous distal esophagus. Capping off the NG tube allowed for the       esophagus to be distended, which helped to straighten out the distal       esophagus. The snare sheath was advanced forward to help straighten the       esophagus further. The combination of these maneuvers allowed for the NG       tube to be advanced with ease into the gastric lumen. The tube was       advanced to the distal gastric lumen and held in place with the       rat-toothed forcep as the scope was withdrawn into the proximal stomach.       At this point the rat-toothed forcep was opened and pulled out. With a       series of oscillating withdrawal maneuvers the endoscope scope was       removed without pulling out the NG tube. Impression:               - Tortuous esophagus.                           - 30-32 cm hiatal hernia.                           - Benign-appearing esophageal stenosis.                           - Acquired deformity in the gastric body.                           - Normal examined duodenum.                           - No specimens collected. Moderate Sedation:      Moderate (conscious) sedation was administered by the endoscopy nurse       and supervised by the endoscopist. The following parameters were  monitored: oxygen saturation, heart rate, blood pressure, and response       to care. Recommendation:           - Return patient to hospital ward for ongoing care.                            - NPO.                           - Continue present medications.                           - Further management per surgery Procedure Code(s):        --- Professional ---                           [phone], Esophagogastroduodenoscopy, flexible,                            transoral; diagnostic, including collection of                            specimen(s) by brushing or washing, when performed                            (separate procedure) Diagnosis Code(s):        --- Professional ---                           Q39.9, Congenital malformation of esophagus,                            unspecified                           K44.9, Diaphragmatic hernia without obstruction or                            gangrene                           K22.2, Esophageal obstruction                           K31.89, Other diseases of stomach and duodenum                           R93.3, Abnormal findings on diagnostic imaging of                            other parts of digestive tract CPT copyright 2017 [employer]. All rights reserved. The codes documented in this report are preliminary and upon coder review may  be revised to meet current compliance requirements. Carol Ada, MD Carol Ada, MD 09/16/2017 10:07:00 PM This report has been signed electronically. Number of Addenda: 0

## 2017-09-16 NOTE — ED Provider Notes (Signed)
Emergency Department Provider Note   I have reviewed the triage vital signs and the nursing notes.   HISTORY  Chief Complaint Abdominal Pain; Emesis; and Headache   HPI Jorge [name] is a 84 y.o. male with PMH of HTN, prostate cancer, GERD, hiatal hernia, and prior appendectomy presents to the ED for evaluation of upper abdominal pain, nausea, and vomiting. Patient denies any chest pain or SOB. No blood in the emesis. No diarrhea. Patient denies passing any stool or flatus since the symptoms began. Symptoms began last night when he ate a steak.    Past Medical History:  Diagnosis Date  . Anxiety   . ED (erectile dysfunction)   . Gastric ulcer    Dr. Ardis Hughs  . GERD (gastroesophageal reflux disease)   . Hernia, hiatal   . History of colonic polyps   . Hx of radiation therapy   . Hypertension   . Insomnia   . Prostate cancer [facility]) 2010   Dr. Luberta Robertson  . Syncope 2011   from Bicalutamide  . Vitamin D deficiency     Patient Active Problem List   Diagnosis Date Noted  . Small bowel obstruction ([place]) 09/16/2017  . Osteoarthritis 09/12/2017  . Hot flash due to medication 04/05/2017  . Squamous cell skin cancer 11/15/2016  . Prostate cancer ([place]) 02/17/2016  . Elbow pain 10/21/2015  . Insomnia 08/18/2014  . Sebaceous cyst 08/18/2014  . Cerumen impaction 07/23/2013  . Eczema 08/23/2012  . Pain in joint, shoulder region 08/23/2012  . LBP (low back pain) 05/21/2012  . Actinic keratoses 10/26/2011  . Contact dermatitis 09/22/2011  . Large hiatal hernia 07/13/2011  . Nonspecific (abnormal) findings on radiological and other examination of gastrointestinal tract 06/05/2011  . LUQ abdominal pain 04/22/2011  . Constipation 04/22/2011  . Chest pain, atypical 04/22/2011  . Irreducible hiatal hernia 04/22/2011  . Neoplasm of uncertain behavior of skin 11/10/2010  . HIP PAIN 04/28/2010  . Diarrhea 10/07/2008  . ERECTILE DYSFUNCTION 08/22/2008  . PERSONAL HX COLON  CANCER 11/06/2007  . INSOMNIA, PERSISTENT 08/17/2007  . Vitamin D deficiency 04/19/2007  . Anxiety state 04/19/2007  . Essential hypertension 04/19/2007  . GERD 04/19/2007  . PEPTIC ULCER DISEASE 04/19/2007  . CERVICAL STRAIN 04/19/2007  . COLONIC POLYPS, HX OF 04/19/2007    Past Surgical History:  Procedure Laterality Date  . APPENDECTOMY     age 53  . CYST REMOVAL NECK N/A 10/16/2014   Procedure: EXCISION CYST POSTERIOR NECK;  Surgeon: Autumn Messing III, MD;  Location: [place];  Service: General;  Laterality: N/A;  posterior  . ESOPHAGOGASTRODUODENOSCOPY  06/05/2011   Procedure: ESOPHAGOGASTRODUODENOSCOPY (EGD);  Surgeon: Gatha Mayer, MD;  Location: [facility] ENDOSCOPY;  Service: Endoscopy;  Laterality: N/A;  . HERNIA REPAIR  06/10/2011      . HIATAL HERNIA REPAIR  06/10/2011   Procedure: LAPAROSCOPIC REPAIR OF HIATAL HERNIA;  Surgeon: Pedro Earls, MD;  Location: [place];  Service: General;  Laterality: N/A;  Laparoscopic repair of paraesophageal hernia.  [name] PROSTATECTOMY  2008  . TRANSURETHRAL RESECTION OF PROSTATE      Current Outpatient Rx  . Order #: [id number] Class: Historical Med  . Order #: [id number] Class: Historical Med  . Order #: [id number] Class: Historical Med  . Order #: [id number] Class: Historical Med  . Order #: [id number] Class: Historical Med  . Order #: [id number] Class: Print  . Order #: [id number] Class: Historical Med    Allergies Aspirin; Azithromycin; Cefuroxime axetil; Iodinated diagnostic agents; Iodine; Other; and  Naproxen  Family History  Problem Relation Age of Onset  . Pancreatic cancer Father   . Hypertension Father   . Cancer Father   . Hypertension Other     Social History Social History   Tobacco Use  . Smoking status: Former Research scientist (life sciences)  . Smokeless tobacco: Never Used  Substance Use Topics  . Alcohol use: No    Comment: social  . Drug use: No    Review of Systems  Constitutional: No fever/chills Eyes: No visual changes. ENT:  No sore throat. Cardiovascular: Denies chest pain. Respiratory: Denies shortness of breath. Gastrointestinal: Positive epigastric abdominal pain. Positive nausea and vomiting.  No diarrhea.  No constipation. Genitourinary: Negative for dysuria. Musculoskeletal: Negative for back pain. Skin: Negative for rash. Neurological: Negative for headaches, focal weakness or numbness.  10-point ROS otherwise negative.  ____________________________________________   PHYSICAL EXAM:  VITAL SIGNS: ED Triage Vitals  Enc Vitals Group     BP 09/16/17 1132 (!) 165/96     Pulse Rate 09/16/17 1132 92     Resp 09/16/17 1132 18     Temp 09/16/17 1132 98.6 F (37 C)     Temp Source 09/16/17 1132 Oral     SpO2 09/16/17 1132 98 %     Pain Score 09/16/17 1144 10   Constitutional: Alert and oriented. Appears slightly uncomfortable but able to provide full history.  Eyes: Conjunctivae are normal. Head: Atraumatic. Nose: No congestion/rhinnorhea. Mouth/Throat: Mucous membranes are moist.  Neck: No stridor.   Cardiovascular: Normal rate, regular rhythm. Good peripheral circulation. Grossly normal heart sounds.   Respiratory: Normal respiratory effort.  No retractions. Lungs CTAB. Gastrointestinal: Soft with moderate epigastric tenderness. No distention.  Musculoskeletal: No lower extremity tenderness nor edema. No gross deformities of extremities. Neurologic:  Normal speech and language. No gross focal neurologic deficits are appreciated.  Skin:  Skin is warm, dry and intact. No rash noted.  ____________________________________________   LABS (all labs ordered are listed, but only abnormal results are displayed)  Labs Reviewed  COMPREHENSIVE METABOLIC PANEL - Abnormal; Notable for the following components:      Result Value   Glucose, Bld 156 (*)    BUN 21 (*)    ALT 16 (*)    Alkaline Phosphatase 180 (*)    All other components within normal limits  CBC WITH DIFFERENTIAL/PLATELET - Abnormal;  Notable for the following components:   WBC 14.6 (*)    Neutro Abs 12.7 (*)    All other components within normal limits  URINALYSIS, ROUTINE W REFLEX MICROSCOPIC - Abnormal; Notable for the following components:   Hgb urine dipstick SMALL (*)    Ketones, ur 20 (*)    Protein, ur 100 (*)    All other components within normal limits  LIPASE, BLOOD  I-STAT TROPONIN, ED   ____________________________________________  EKG   EKG Interpretation  Date/Time:  Saturday September 16 2017 13:20:35 EDT Ventricular Rate:  80 PR Interval:    QRS Duration: 108 QT Interval:  436 QTC Calculation: 503 R Axis:   41 Text Interpretation:  Sinus rhythm Prolonged PR interval RAE, consider biatrial enlargement Abnormal T, consider ischemia, lateral leads Borderline ST elevation, anterior leads Prolonged QT interval No STEMI.  Confirmed by Nanda Quinton [phone]) on 09/16/2017 1:39:01 PM Also confirmed by Nanda Quinton [phone]), editor Philomena Doheny [phone])  on 09/16/2017 3:42:03 PM       ____________________________________________  RADIOLOGY  Ct Abdomen Pelvis Wo Contrast  Result Date: 09/16/2017 CLINICAL DATA:  Abdominal pain,  headache, emesis since yesterday. History of hernia repair. EXAM: CT ABDOMEN AND PELVIS WITHOUT CONTRAST TECHNIQUE: Multidetector CT imaging of the abdomen and pelvis was performed following the standard protocol without IV contrast. COMPARISON:  CT abdomen dated 04/18/2017. FINDINGS: Lower chest: Elevation of the LEFT hemidiaphragm, likely chronic, with associated overlying atelectasis. Milder atelectasis/scarring at the RIGHT lung base. Prominent hiatal hernia with tortuosity of the stomach within the hernia sac. Stomach is prominently distended with associated air-fluid levels. Hepatobiliary: No focal liver abnormality is seen. No gallstones, gallbladder wall thickening, or biliary dilatation. Pancreas: Partially infiltrated with fat but otherwise unremarkable. The pancreatic head and  body are displaced rightward by a suspected internal hernia. Spleen: Normal in size without focal abnormality. Adrenals/Urinary Tract: Adrenal glands appear normal. Kidneys are unremarkable without mass, stone or hydronephrosis. No ureteral or bladder calculi identified. Bladder appears normal, partially decompressed. Stomach/Bowel: Majority of the small and large bowel is decompressed. The prominent hiatal hernia also includes a portion of the transverse colon and upper LEFT colon. There is scattered mild diverticulosis without evidence of acute diverticulitis. As above, the stomach is significantly distended, with associated air-fluid levels. There is an abrupt transition zone at the level of the proximal duodenum, probable mechanical obstruction. Swirling of the RIGHT upper quadrant mesentery raises the possibility of internal hernia as the cause of the apparent obstruction. Vascular/Lymphatic: Aortic atherosclerosis. No enlarged abdominal or pelvic lymph nodes. Reproductive: Prostate is unremarkable. Other: No free fluid or abscess collections seen. No free intraperitoneal air. Musculoskeletal: Advanced degenerative change throughout the thoracolumbar spine and at the LEFT hip. Probable ankylosing spondylitis of the thoracic spine. No acute or suspicious osseous finding. IMPRESSION: 1. Large hiatal hernia containing the majority of the stomach and a portion of the colon. Stomach is prominently distended, with associated air-fluid levels, and with a tortuous configuration. There is abrupt transition to a decompressed proximal descending duodenum, with swirling of the surrounding RIGHT upper quadrant mesentery raising the possibility of internal hernia causing the apparent mechanical bowel obstruction. Remainder of the small and large bowel is relatively decompressed. 2. Colonic diverticulosis without evidence of acute diverticulitis. 3. Aortic atherosclerosis. 4. Additional chronic/incidental findings detailed  above These results were called by telephone at the time of interpretation on 09/16/2017 at 3:50 pm to Dr. Nanda Quinton , who verbally acknowledged these results. Electronically Signed   By: Franki Cabot M.D.   On: 09/16/2017 15:55   Dg Abdomen 1 View  Result Date: 09/16/2017 CLINICAL DATA:  Gastric tube placement EXAM: ABDOMEN - 1 VIEW COMPARISON:  None. FINDINGS: Targeted study of the left upper quadrant of the abdomen was provided. Malpositioned gastric tube is noted with tip projecting over the medial right lung base possibly along the right mainstem bronchus. Cardiomegaly with small left effusion is present. There appears be gastric distention with air. Thoracic and upper lumbar syndesmophytes are noted with osteophytes of the mid to lower included lumbar spine. IMPRESSION: 1. Malpositioned gastric tube projecting over the medial right lung base, possibly within right mainstem bronchus. Repositioning is recommended by withdrawing and reinserting. 2. Cardiomegaly with small left effusion. 3. Gastric distention is suggested as well Electronically Signed   By: Ashley Royalty M.D.   On: 09/16/2017 17:39   Dg Abdomen Acute W/chest  Result Date: 09/16/2017 CLINICAL DATA:  Distended abdomen. EXAM: DG ABDOMEN ACUTE W/ 1V CHEST COMPARISON:  CT 04/18/2017 FINDINGS: Large left diaphragmatic hernia is stable dating back to prior CT. Air-fluid level in the left lower chest likely within the  stomach. Suspect mild cardiomegaly although the heart is difficult to visualize. No focal airspace opacities or effusions. Nonobstructive bowel gas pattern. No organomegaly or suspicious calcification. No acute bony abnormality. IMPRESSION: Large left diaphragmatic hernia again noted as seen on prior CTs. No obstruction or free air. No acute cardiopulmonary disease. Electronically Signed   By: Rolm Baptise M.D.   On: 09/16/2017 14:27    ____________________________________________   PROCEDURES  Procedure(s) performed:    Procedures  CRITICAL CARE Performed by: Margette Fast Total critical care time: 35 minutes Critical care time was exclusive of separately billable procedures and treating other patients. Critical care was necessary to treat or prevent imminent or life-threatening deterioration. Critical care was time spent personally by me on the following activities: development of treatment plan with patient and/or surrogate as well as nursing, discussions with consultants, evaluation of patient's response to treatment, examination of patient, obtaining history from patient or surrogate, ordering and performing treatments and interventions, ordering and review of laboratory studies, ordering and review of radiographic studies, pulse oximetry and re-evaluation of patient's condition.  Nanda Quinton, MD Emergency Medicine  ____________________________________________   INITIAL IMPRESSION / ASSESSMENT AND PLAN / ED COURSE  Pertinent labs & imaging results that were available during my care of the patient were reviewed by me and considered in my medical decision making (see chart for details).  Patient with epigastric abdominal pain and vomiting. Plain film to r/o obstruction but will follow with CT. Oral contrast only with IV contrast allergy. Labs reviewed with leukocytosis. Normal troponin and EKG. Doubt ACS. Evaluate for SBO.   CT results reviewed with radiology. Will contact Dr. Harlow Asa.   04:10 PM Spoke with general surgery, Dr. Harlow Asa.  They will be down to see the patient and likely go to surgery.  Agree with placing NG tube for decompression.   Discussed patient's case with Surgery, Dr. Harlow Asa to request admission. Patient and family (if present) updated with plan. Care transferred to Hospitalist service.  I reviewed all nursing notes, vitals, pertinent old records, EKGs, labs, imaging (as available).  ____________________________________________  FINAL CLINICAL IMPRESSION(S) / ED  DIAGNOSES  Final diagnoses:  Obstructed internal hernia  Non-intractable vomiting with nausea, unspecified vomiting type     MEDICATIONS GIVEN DURING THIS VISIT:  Medications  dextrose 5 % and 0.45 % NaCl with KCl 20 mEq/L infusion (has no administration in time range)  acetaminophen (TYLENOL) tablet 650 mg (has no administration in time range)    Or  acetaminophen (TYLENOL) suppository 650 mg (has no administration in time range)  morphine 2 MG/ML injection 1-3 mg (has no administration in time range)  ondansetron (ZOFRAN-ODT) disintegrating tablet 4 mg (has no administration in time range)    Or  ondansetron (ZOFRAN) injection 4 mg (has no administration in time range)  famotidine (PEPCID) IVPB 20 mg premix (has no administration in time range)  sodium chloride 0.9 % bolus 500 mL (0 mLs Intravenous Stopped 09/16/17 1407)  ondansetron (ZOFRAN) injection 4 mg (4 mg Intravenous Given 09/16/17 1313)  morphine 2 MG/ML injection 2 mg (2 mg Intravenous Given 09/16/17 1313)  morphine 4 MG/ML injection 4 mg (4 mg Intravenous Given 09/16/17 1553)  barium (READI-CAT 2) 2 % suspension 450 mL (450 mLs Oral Given 09/16/17 1533)  LORazepam (ATIVAN) injection 0.5 mg (0.5 mg Intravenous Given 09/16/17 1634)  lidocaine (PF) (XYLOCAINE) 1 % injection 5 mL (5 mLs Infiltration Given 09/16/17 1655)  lidocaine (XYLOCAINE) 2 % jelly 1 application (1 application Urethral Given 09/16/17  1656)  iopamidol (ISOVUE-300) 61 % injection 50 mL (50 mLs Oral Contrast Given 09/16/17 1922)    Note:  This document was prepared using Dragon voice recognition software and may include unintentional dictation errors.  Nanda Quinton, MD Emergency Medicine    Long, [facility], MD 09/16/17 [id number]

## 2017-09-16 NOTE — ED Notes (Signed)
Attempted to give report to 4th floor.  Receiving RN has stepped off the floor and will call back when she returns.

## 2017-09-16 NOTE — Consult Note (Signed)
Consult Note for [facility]  Reason for Consult: Recurrent hiatal hernia with obstruction Referring Physician: CCS  Storm Frisk HPI: This is an 84 year old male with complaints of nausea and vomiting.  He presented to the ER and he was noted to have a recurrent hiatal hernia.  There is a noted air-fluid level and significant dilation of the gastric lumen.  There is the possibility of a proximal duodenal obstruction.  The initial attempt to place the NG tube in the ER resulted in the tube going into the right mainstem bronchus.  IR attempted to place the tube without success.  GI is consulted to place an NG tube for decompression.  Past Medical History:  Diagnosis Date  . Anxiety   . ED (erectile dysfunction)   . Gastric ulcer    Dr. Ardis Hughs  . GERD (gastroesophageal reflux disease)   . Hernia, hiatal   . History of colonic polyps   . Hx of radiation therapy   . Hypertension   . Insomnia   . Prostate cancer [facility]) 2010   Dr. Luberta Robertson  . Syncope 2011   from Bicalutamide  . Vitamin D deficiency     Past Surgical History:  Procedure Laterality Date  . APPENDECTOMY     age 52  . CYST REMOVAL NECK N/A 10/16/2014   Procedure: EXCISION CYST POSTERIOR NECK;  Surgeon: Autumn Messing III, MD;  Location: [place];  Service: General;  Laterality: N/A;  posterior  . ESOPHAGOGASTRODUODENOSCOPY  06/05/2011   Procedure: ESOPHAGOGASTRODUODENOSCOPY (EGD);  Surgeon: Gatha Mayer, MD;  Location: [facility] ENDOSCOPY;  Service: Endoscopy;  Laterality: N/A;  . HERNIA REPAIR  06/10/2011      . HIATAL HERNIA REPAIR  06/10/2011   Procedure: LAPAROSCOPIC REPAIR OF HIATAL HERNIA;  Surgeon: Pedro Earls, MD;  Location: [place];  Service: General;  Laterality: N/A;  Laparoscopic repair of paraesophageal hernia.  [name] PROSTATECTOMY  2008  . TRANSURETHRAL RESECTION OF PROSTATE      Family History  Problem Relation Age of Onset  . Pancreatic cancer Father   . Hypertension Father   . Cancer Father    . Hypertension Other     Social History:  reports that he has quit smoking. He has never used smokeless tobacco. He reports that he does not drink alcohol or use drugs.  Allergies:  Allergies  Allergen Reactions  . Aspirin Other (See Comments)    Ulcerated stomach  . Azithromycin Shortness Of Breath  . Cefuroxime Axetil Shortness Of Breath  . Iodinated Diagnostic Agents Anaphylaxis  . Iodine Other (See Comments)    Per pt tremors and dyspnea  . Other Shortness Of Breath    Twist in esophagus so patient can not take large pills/capsules  . Naproxen Other (See Comments)    REACTION: upset stomach - like with other NSAIDs    Medications:  Scheduled: . mupirocin ointment  1 application Nasal BID   Continuous: . sodium chloride    . dextrose 5 % and 0.45 % NaCl with KCl 20 mEq/L    . famotidine (PEPCID) IV      Results for orders placed or performed during the hospital encounter of 09/16/17 (from the past 24 hour(s))  Comprehensive metabolic panel     Status: Abnormal   Collection Time: 09/16/17  1:11 PM  Result Value Ref Range   Sodium 141 135 - 145 mmol/L   Potassium 3.8 3.5 - 5.1 mmol/L   Chloride 104 101 - 111 mmol/L  CO2 24 22 - 32 mmol/L   Glucose, Bld 156 (H) 65 - 99 mg/dL   BUN 21 (H) 6 - 20 mg/dL   Creatinine, Ser 0.99 0.61 - 1.24 mg/dL   Calcium 9.9 8.9 - 10.3 mg/dL   Total Protein 7.8 6.5 - 8.1 g/dL   Albumin 4.3 3.5 - 5.0 g/dL   AST 35 15 - 41 U/L   ALT 16 (L) 17 - 63 U/L   Alkaline Phosphatase 180 (H) 38 - 126 U/L   Total Bilirubin 0.8 0.3 - 1.2 mg/dL   GFR calc non Af Amer >60 >60 mL/min   GFR calc Af Amer >60 >60 mL/min   Anion gap 13 5 - 15  Lipase, blood     Status: None   Collection Time: 09/16/17  1:11 PM  Result Value Ref Range   Lipase 31 11 - 51 U/L  CBC with Differential     Status: Abnormal   Collection Time: 09/16/17  1:11 PM  Result Value Ref Range   WBC 14.6 (H) 4.0 - 10.5 K/uL   RBC 4.91 4.22 - 5.81 MIL/uL   Hemoglobin 16.0 13.0 -  17.0 g/dL   HCT 47.6 39.0 - 52.0 %   MCV 96.9 78.0 - 100.0 fL   MCH 32.6 26.0 - 34.0 pg   MCHC 33.6 30.0 - 36.0 g/dL   RDW 13.2 11.5 - 15.5 %   Platelets 243 150 - 400 K/uL   Neutrophils Relative % 87 %   Neutro Abs 12.7 (H) 1.7 - 7.7 K/uL   Lymphocytes Relative 7 %   Lymphs Abs 1.0 0.7 - 4.0 K/uL   Monocytes Relative 6 %   Monocytes Absolute 0.9 0.1 - 1.0 K/uL   Eosinophils Relative 0 %   Eosinophils Absolute 0.0 0.0 - 0.7 K/uL   Basophils Relative 0 %   Basophils Absolute 0.0 0.0 - 0.1 K/uL  Urinalysis, Routine w reflex microscopic     Status: Abnormal   Collection Time: 09/16/17  1:11 PM  Result Value Ref Range   Color, Urine YELLOW YELLOW   APPearance CLEAR CLEAR   Specific Gravity, Urine 1.026 1.005 - 1.030   pH 6.0 5.0 - 8.0   Glucose, UA NEGATIVE NEGATIVE mg/dL   Hgb urine dipstick SMALL (A) NEGATIVE   Bilirubin Urine NEGATIVE NEGATIVE   Ketones, ur 20 (A) NEGATIVE mg/dL   Protein, ur 100 (A) NEGATIVE mg/dL   Nitrite NEGATIVE NEGATIVE   Leukocytes, UA NEGATIVE NEGATIVE   RBC / HPF 6-10 0 - 5 RBC/hpf   WBC, UA 0-5 0 - 5 WBC/hpf   Bacteria, UA NONE SEEN NONE SEEN   Squamous Epithelial / LPF 0-5 0 - 5   Mucus PRESENT   I-stat troponin, ED     Status: None   Collection Time: 09/16/17  1:16 PM  Result Value Ref Range   Troponin i, poc 0.01 0.00 - 0.08 ng/mL   Comment 3             Ct Abdomen Pelvis Wo Contrast  Result Date: 09/16/2017 CLINICAL DATA:  Abdominal pain, headache, emesis since yesterday. History of hernia repair. EXAM: CT ABDOMEN AND PELVIS WITHOUT CONTRAST TECHNIQUE: Multidetector CT imaging of the abdomen and pelvis was performed following the standard protocol without IV contrast. COMPARISON:  CT abdomen dated 04/18/2017. FINDINGS: Lower chest: Elevation of the LEFT hemidiaphragm, likely chronic, with associated overlying atelectasis. Milder atelectasis/scarring at the RIGHT lung base. Prominent hiatal hernia with tortuosity of the stomach within  the  hernia sac. Stomach is prominently distended with associated air-fluid levels. Hepatobiliary: No focal liver abnormality is seen. No gallstones, gallbladder wall thickening, or biliary dilatation. Pancreas: Partially infiltrated with fat but otherwise unremarkable. The pancreatic head and body are displaced rightward by a suspected internal hernia. Spleen: Normal in size without focal abnormality. Adrenals/Urinary Tract: Adrenal glands appear normal. Kidneys are unremarkable without mass, stone or hydronephrosis. No ureteral or bladder calculi identified. Bladder appears normal, partially decompressed. Stomach/Bowel: Majority of the small and large bowel is decompressed. The prominent hiatal hernia also includes a portion of the transverse colon and upper LEFT colon. There is scattered mild diverticulosis without evidence of acute diverticulitis. As above, the stomach is significantly distended, with associated air-fluid levels. There is an abrupt transition zone at the level of the proximal duodenum, probable mechanical obstruction. Swirling of the RIGHT upper quadrant mesentery raises the possibility of internal hernia as the cause of the apparent obstruction. Vascular/Lymphatic: Aortic atherosclerosis. No enlarged abdominal or pelvic lymph nodes. Reproductive: Prostate is unremarkable. Other: No free fluid or abscess collections seen. No free intraperitoneal air. Musculoskeletal: Advanced degenerative change throughout the thoracolumbar spine and at the LEFT hip. Probable ankylosing spondylitis of the thoracic spine. No acute or suspicious osseous finding. IMPRESSION: 1. Large hiatal hernia containing the majority of the stomach and a portion of the colon. Stomach is prominently distended, with associated air-fluid levels, and with a tortuous configuration. There is abrupt transition to a decompressed proximal descending duodenum, with swirling of the surrounding RIGHT upper quadrant mesentery raising the  possibility of internal hernia causing the apparent mechanical bowel obstruction. Remainder of the small and large bowel is relatively decompressed. 2. Colonic diverticulosis without evidence of acute diverticulitis. 3. Aortic atherosclerosis. 4. Additional chronic/incidental findings detailed above These results were called by telephone at the time of interpretation on 09/16/2017 at 3:50 pm to Dr. Nanda Quinton , who verbally acknowledged these results. Electronically Signed   By: Franki Cabot M.D.   On: 09/16/2017 15:55   Dg Abdomen 1 View  Result Date: 09/16/2017 CLINICAL DATA:  Gastric tube placement EXAM: ABDOMEN - 1 VIEW COMPARISON:  None. FINDINGS: Targeted study of the left upper quadrant of the abdomen was provided. Malpositioned gastric tube is noted with tip projecting over the medial right lung base possibly along the right mainstem bronchus. Cardiomegaly with small left effusion is present. There appears be gastric distention with air. Thoracic and upper lumbar syndesmophytes are noted with osteophytes of the mid to lower included lumbar spine. IMPRESSION: 1. Malpositioned gastric tube projecting over the medial right lung base, possibly within right mainstem bronchus. Repositioning is recommended by withdrawing and reinserting. 2. Cardiomegaly with small left effusion. 3. Gastric distention is suggested as well Electronically Signed   By: Ashley Royalty M.D.   On: 09/16/2017 17:39   Dg Abdomen Acute W/chest  Result Date: 09/16/2017 CLINICAL DATA:  Distended abdomen. EXAM: DG ABDOMEN ACUTE W/ 1V CHEST COMPARISON:  CT 04/18/2017 FINDINGS: Large left diaphragmatic hernia is stable dating back to prior CT. Air-fluid level in the left lower chest likely within the stomach. Suspect mild cardiomegaly although the heart is difficult to visualize. No focal airspace opacities or effusions. Nonobstructive bowel gas pattern. No organomegaly or suspicious calcification. No acute bony abnormality. IMPRESSION:  Large left diaphragmatic hernia again noted as seen on prior CTs. No obstruction or free air. No acute cardiopulmonary disease. Electronically Signed   By: Rolm Baptise M.D.   On: 09/16/2017 14:27  ROS:  As stated above in the HPI otherwise negative.  Blood pressure (!) 163/107, pulse 96, temperature 98.3 F (36.8 C), temperature source Oral, resp. rate 20, height 6' 1.5" (1.867 m), weight 96.9 kg (213 lb 10 oz), SpO2 93 %.    PE: Gen: NAD, Alert and Oriented HEENT:  [place]/AT, EOMI Neck: Supple, no LAD Lungs: CTA Bilaterally CV: RRR without M/G/R ABM: Soft, NTND, +BS Ext: No C/C/E  Assessment/Plan: 1) Recurrent hiatal hernia. 2) GOO.  Plan: 1) EGD with decompression and placement of an NG tube.  Roy Snuffer D 09/16/2017, 9:01 PM

## 2017-09-16 NOTE — ED Triage Notes (Signed)
Pt complains of abdominal pain, headache, emesis since noon yesterday. Symptoms began after eating lunch. Pt denies diarrhea.

## 2017-09-16 NOTE — Progress Notes (Signed)
General Surgery [facility]  Attempted NG placement in radiology under fluoro guidance by Dr. Jobe Igo without success.  Will consult gastroenterology for endoscopically placed NG tube to decompress stomach and relieve obstruction.  Armandina Gemma, [facility] Office: [phone]

## 2017-09-16 NOTE — ED Notes (Signed)
RA sats reading consistently around 89-90%.  Placed on 2L/[place] and O2 sats reading 93-94% on placement

## 2017-09-17 LAB — SURGICAL PCR SCREEN
MRSA, PCR: NEGATIVE
Staphylococcus aureus: NEGATIVE

## 2017-09-17 NOTE — Progress Notes (Signed)
Patient ID: Jorge [name], male   DOB: 09-15-1929, 84 y.o.   MRN: [id number] [facility] Progress Note:   1 Day Post-Op  Subjective: Mental status is clear; he is feeling much better after endoscopic NG placement and decompression with 3 liters of content removed.  Objective: Vital signs in last 24 hours: Temp:  [98 F (36.7 C)-98.6 F (37 C)] 98 F (36.7 C) (06/16 0454) Pulse Rate:  [33-106] 79 (06/16 0454) Resp:  [15-27] 17 (06/16 0454) BP: (145-212)/(81-117) 152/85 (06/16 0454) SpO2:  [92 %-100 %] 99 % (06/16 0454) Weight:  [96.9 kg (213 lb 10 oz)] 96.9 kg (213 lb 10 oz) (06/15 2017)  Intake/Output from previous day: 06/15 0701 - 06/16 0700 In: 1350 [I.V.:800; IV Piggyback:550] Out: 550 [Urine:550] Intake/Output this shift: No intake/output data recorded.  Physical Exam: Work of breathing is not labored and he is in no distress or abdominal pain.    Lab Results:  Results for orders placed or performed during the hospital encounter of 09/16/17 (from the past 48 hour(s))  Comprehensive metabolic panel     Status: Abnormal   Collection Time: 09/16/17  1:11 PM  Result Value Ref Range   Sodium 141 135 - 145 mmol/L   Potassium 3.8 3.5 - 5.1 mmol/L   Chloride 104 101 - 111 mmol/L   CO2 24 22 - 32 mmol/L   Glucose, Bld 156 (H) 65 - 99 mg/dL   BUN 21 (H) 6 - 20 mg/dL   Creatinine, Ser 0.99 0.61 - 1.24 mg/dL   Calcium 9.9 8.9 - 10.3 mg/dL   Total Protein 7.8 6.5 - 8.1 g/dL   Albumin 4.3 3.5 - 5.0 g/dL   AST 35 15 - 41 U/L   ALT 16 (L) 17 - 63 U/L   Alkaline Phosphatase 180 (H) 38 - 126 U/L   Total Bilirubin 0.8 0.3 - 1.2 mg/dL   GFR calc non Af Amer >60 >60 mL/min   GFR calc Af Amer >60 >60 mL/min    Comment: (NOTE) The eGFR has been calculated using the CKD EPI equation. This calculation has not been validated in all clinical situations. eGFR's persistently <60 mL/min signify possible Chronic Kidney Disease.    Anion gap 13 5 - 15    Comment: Performed at  [facility], [place] [address] [place], [place] [address]  Lipase, blood     Status: None   Collection Time: 09/16/17  1:11 PM  Result Value Ref Range   Lipase 31 11 - 51 U/L    Comment: Performed at [facility], [place] [address], [place],  [address]  CBC with Differential     Status: Abnormal   Collection Time: 09/16/17  1:11 PM  Result Value Ref Range   WBC 14.6 (H) 4.0 - 10.5 K/uL   RBC 4.91 4.22 - 5.81 MIL/uL   Hemoglobin 16.0 13.0 - 17.0 g/dL   HCT 47.6 39.0 - 52.0 %   MCV 96.9 78.0 - 100.0 fL   MCH 32.6 26.0 - 34.0 pg   MCHC 33.6 30.0 - 36.0 g/dL   RDW 13.2 11.5 - 15.5 %   Platelets 243 150 - 400 K/uL   Neutrophils Relative % 87 %   Neutro Abs 12.7 (H) 1.7 - 7.7 K/uL   Lymphocytes Relative 7 %   Lymphs Abs 1.0 0.7 - 4.0 K/uL   Monocytes Relative 6 %   Monocytes Absolute 0.9 0.1 - 1.0 K/uL   Eosinophils Relative 0 %  Eosinophils Absolute 0.0 0.0 - 0.7 K/uL   Basophils Relative 0 %   Basophils Absolute 0.0 0.0 - 0.1 K/uL    Comment: Performed at [facility], [place] [address], [place], [place] [address]  Urinalysis, Routine w reflex microscopic     Status: Abnormal   Collection Time: 09/16/17  1:11 PM  Result Value Ref Range   Color, Urine YELLOW YELLOW   APPearance CLEAR CLEAR   Specific Gravity, Urine 1.026 1.005 - 1.030   pH 6.0 5.0 - 8.0   Glucose, UA NEGATIVE NEGATIVE mg/dL   Hgb urine dipstick SMALL (A) NEGATIVE   Bilirubin Urine NEGATIVE NEGATIVE   Ketones, ur 20 (A) NEGATIVE mg/dL   Protein, ur 100 (A) NEGATIVE mg/dL   Nitrite NEGATIVE NEGATIVE   Leukocytes, UA NEGATIVE NEGATIVE   RBC / HPF 6-10 0 - 5 RBC/hpf   WBC, UA 0-5 0 - 5 WBC/hpf   Bacteria, UA NONE SEEN NONE SEEN   Squamous Epithelial / LPF 0-5 0 - 5   Mucus PRESENT     Comment: Performed at [facility], [place] [address] [place], [place] [address]  I-stat troponin, ED     Status: None   Collection Time: 09/16/17  1:16  PM  Result Value Ref Range   Troponin i, poc 0.01 0.00 - 0.08 ng/mL   Comment 3            Comment: Due to the release kinetics of cTnI, a negative result within the first hours of the onset of symptoms does not rule out myocardial infarction with certainty. If myocardial infarction is still suspected, repeat the test at appropriate intervals.   Surgical PCR screen     Status: None   Collection Time: 09/16/17 10:24 PM  Result Value Ref Range   MRSA, PCR NEGATIVE NEGATIVE   Staphylococcus aureus NEGATIVE NEGATIVE    Comment: (NOTE) The Xpert SA Assay (FDA approved for NASAL specimens in patients 1 years of age and older), is one component of a comprehensive surveillance program. It is not intended to diagnose infection nor to guide or monitor treatment. Performed at [facility], [place] [address], [place],  [address]     Radiology/Results: Ct Abdomen Pelvis Wo Contrast  Result Date: 09/16/2017 CLINICAL DATA:  Abdominal pain, headache, emesis since yesterday. History of hernia repair. EXAM: CT ABDOMEN AND PELVIS WITHOUT CONTRAST TECHNIQUE: Multidetector CT imaging of the abdomen and pelvis was performed following the standard protocol without IV contrast. COMPARISON:  CT abdomen dated 04/18/2017. FINDINGS: Lower chest: Elevation of the LEFT hemidiaphragm, likely chronic, with associated overlying atelectasis. Milder atelectasis/scarring at the RIGHT lung base. Prominent hiatal hernia with tortuosity of the stomach within the hernia sac. Stomach is prominently distended with associated air-fluid levels. Hepatobiliary: No focal liver abnormality is seen. No gallstones, gallbladder wall thickening, or biliary dilatation. Pancreas: Partially infiltrated with fat but otherwise unremarkable. The pancreatic head and body are displaced rightward by a suspected internal hernia. Spleen: Normal in size without focal abnormality. Adrenals/Urinary Tract: Adrenal glands appear  normal. Kidneys are unremarkable without mass, stone or hydronephrosis. No ureteral or bladder calculi identified. Bladder appears normal, partially decompressed. Stomach/Bowel: Majority of the small and large bowel is decompressed. The prominent hiatal hernia also includes a portion of the transverse colon and upper LEFT colon. There is scattered mild diverticulosis without evidence of acute diverticulitis. As above, the stomach is significantly distended, with associated air-fluid levels. There is an abrupt transition zone at  the level of the proximal duodenum, probable mechanical obstruction. Swirling of the RIGHT upper quadrant mesentery raises the possibility of internal hernia as the cause of the apparent obstruction. Vascular/Lymphatic: Aortic atherosclerosis. No enlarged abdominal or pelvic lymph nodes. Reproductive: Prostate is unremarkable. Other: No free fluid or abscess collections seen. No free intraperitoneal air. Musculoskeletal: Advanced degenerative change throughout the thoracolumbar spine and at the LEFT hip. Probable ankylosing spondylitis of the thoracic spine. No acute or suspicious osseous finding. IMPRESSION: 1. Large hiatal hernia containing the majority of the stomach and a portion of the colon. Stomach is prominently distended, with associated air-fluid levels, and with a tortuous configuration. There is abrupt transition to a decompressed proximal descending duodenum, with swirling of the surrounding RIGHT upper quadrant mesentery raising the possibility of internal hernia causing the apparent mechanical bowel obstruction. Remainder of the small and large bowel is relatively decompressed. 2. Colonic diverticulosis without evidence of acute diverticulitis. 3. Aortic atherosclerosis. 4. Additional chronic/incidental findings detailed above These results were called by telephone at the time of interpretation on 09/16/2017 at 3:50 pm to Dr. Nanda Quinton , who verbally acknowledged these  results. Electronically Signed   By: Franki Cabot M.D.   On: 09/16/2017 15:55   Dg Abdomen 1 View  Result Date: 09/16/2017 CLINICAL DATA:  Gastric tube placement EXAM: ABDOMEN - 1 VIEW COMPARISON:  None. FINDINGS: Targeted study of the left upper quadrant of the abdomen was provided. Malpositioned gastric tube is noted with tip projecting over the medial right lung base possibly along the right mainstem bronchus. Cardiomegaly with small left effusion is present. There appears be gastric distention with air. Thoracic and upper lumbar syndesmophytes are noted with osteophytes of the mid to lower included lumbar spine. IMPRESSION: 1. Malpositioned gastric tube projecting over the medial right lung base, possibly within right mainstem bronchus. Repositioning is recommended by withdrawing and reinserting. 2. Cardiomegaly with small left effusion. 3. Gastric distention is suggested as well Electronically Signed   By: Ashley Royalty M.D.   On: 09/16/2017 17:39   Dg Abdomen Acute W/chest  Result Date: 09/16/2017 CLINICAL DATA:  Distended abdomen. EXAM: DG ABDOMEN ACUTE W/ 1V CHEST COMPARISON:  CT 04/18/2017 FINDINGS: Large left diaphragmatic hernia is stable dating back to prior CT. Air-fluid level in the left lower chest likely within the stomach. Suspect mild cardiomegaly although the heart is difficult to visualize. No focal airspace opacities or effusions. Nonobstructive bowel gas pattern. No organomegaly or suspicious calcification. No acute bony abnormality. IMPRESSION: Large left diaphragmatic hernia again noted as seen on prior CTs. No obstruction or free air. No acute cardiopulmonary disease. Electronically Signed   By: Rolm Baptise M.D.   On: 09/16/2017 14:27   Dg Intro Long Gi Tube  Result Date: 09/17/2017 CLINICAL DATA:  Recurrent hiatal hernia.  Gastric obstruction. EXAM: INTRO LONG GI TUBE CONTRAST:  19m ISOVUE-300 IOPAMIDOL (ISOVUE-300) INJECTION 61% FLUOROSCOPY TIME:  Fluoroscopy Time:  10 minutes  and 12 seconds Radiation Exposure Index (if provided by the fluoroscopic device): 221 mGy Number of Acquired Spot Images: 0 COMPARISON:  CT of 09/16/2017 FINDINGS: Attempt was made to place the nasogastric tube into the thoracic stomach. Nasogastric tube was placed into the esophagus with location confirmed with a small amount of contrast. Despite multiple maneuvers, including placement of an Amplatz wire, the gastroesophageal junction could not be traversed. After discussion with Dr. GHarlow Asa the nasogastric tube was removed. IMPRESSION: Unsuccessful nasogastric tube placement, as detailed above. Placement was complicated by the large hiatal hernia  and tortuosity/angulation at the gastroesophageal junction. Electronically Signed   By: Abigail Miyamoto M.D.   On: 09/17/2017 07:16    Anti-infectives: Anti-infectives (From admission, onward)   None      Assessment/Plan: Problem List: Patient Active Problem List   Diagnosis Date Noted  . Small bowel obstruction ([place]) 09/16/2017  . Osteoarthritis 09/12/2017  . Hot flash due to medication 04/05/2017  . Squamous cell skin cancer 11/15/2016  . Prostate cancer ([place]) 02/17/2016  . Elbow pain 10/21/2015  . Insomnia 08/18/2014  . Sebaceous cyst 08/18/2014  . Cerumen impaction 07/23/2013  . Eczema 08/23/2012  . Pain in joint, shoulder region 08/23/2012  . LBP (low back pain) 05/21/2012  . Actinic keratoses 10/26/2011  . Contact dermatitis 09/22/2011  . Large hiatal hernia 07/13/2011  . Nonspecific (abnormal) findings on radiological and other examination of gastrointestinal tract 06/05/2011  . LUQ abdominal pain 04/22/2011  . Constipation 04/22/2011  . Chest pain, atypical 04/22/2011  . Irreducible hiatal hernia 04/22/2011  . Neoplasm of uncertain behavior of skin 11/10/2010  . HIP PAIN 04/28/2010  . Diarrhea 10/07/2008  . ERECTILE DYSFUNCTION 08/22/2008  . PERSONAL HX COLON CANCER 11/06/2007  . INSOMNIA, PERSISTENT 08/17/2007  . Vitamin D  deficiency 04/19/2007  . Anxiety state 04/19/2007  . Essential hypertension 04/19/2007  . GERD 04/19/2007  . PEPTIC ULCER DISEASE 04/19/2007  . CERVICAL STRAIN 04/19/2007  . COLONIC POLYPS, HX OF 04/19/2007    Type IV hiatal hernia with volvulus that has been caused the present admission.  I think that he may need G tube pexy at some point.  Prior surgery was unable to approximate crurae and biologic mesh was only a temporary bridge.  Gastropexy is now probably only a long adhesion.   1 Day Post-Op    LOS: 1 day   Matt B. Hassell Done, MD, [facility] [phone] beeper [phone]  09/17/2017 8:50 AM

## 2017-09-17 NOTE — Progress Notes (Signed)
Subjective: No complaints.  Objective: Vital signs in last 24 hours: Temp:  [98 F (36.7 C)-98.6 F (37 C)] 98 F (36.7 C) (06/16 0454) Pulse Rate:  [33-106] 79 (06/16 0454) Resp:  [15-27] 17 (06/16 0454) BP: (145-212)/(81-117) 152/85 (06/16 0454) SpO2:  [92 %-100 %] 99 % (06/16 0454) Weight:  [96.9 kg (213 lb 10 oz)] 96.9 kg (213 lb 10 oz) (06/15 2017) Last BM Date: 09/15/17  Intake/Output from previous day: 06/15 0701 - 06/16 0700 In: 1350 [I.V.:800; IV Piggyback:550] Out: 550 [Urine:550] Intake/Output this shift: No intake/output data recorded.  General appearance: alert and no distress GI: soft, non-tender; bowel sounds normal; no masses,  no organomegaly  Lab Results: Recent Labs    09/16/17 1311  WBC 14.6*  HGB 16.0  HCT 47.6  PLT 243   BMET Recent Labs    09/16/17 1311  NA 141  K 3.8  CL 104  CO2 24  GLUCOSE 156*  BUN 21*  CREATININE 0.99  CALCIUM 9.9   LFT Recent Labs    09/16/17 1311  PROT 7.8  ALBUMIN 4.3  AST 35  ALT 16*  ALKPHOS 180*  BILITOT 0.8   PT/INR No results for input(s): LABPROT, INR in the last 72 hours. Hepatitis Panel No results for input(s): HEPBSAG, HCVAB, HEPAIGM, HEPBIGM in the last 72 hours. C-Diff No results for input(s): CDIFFTOX in the last 72 hours. Fecal Lactopherrin No results for input(s): FECLLACTOFRN in the last 72 hours.  Studies/Results: Ct Abdomen Pelvis Wo Contrast  Result Date: 09/16/2017 CLINICAL DATA:  Abdominal pain, headache, emesis since yesterday. History of hernia repair. EXAM: CT ABDOMEN AND PELVIS WITHOUT CONTRAST TECHNIQUE: Multidetector CT imaging of the abdomen and pelvis was performed following the standard protocol without IV contrast. COMPARISON:  CT abdomen dated 04/18/2017. FINDINGS: Lower chest: Elevation of the LEFT hemidiaphragm, likely chronic, with associated overlying atelectasis. Milder atelectasis/scarring at the RIGHT lung base. Prominent hiatal hernia with tortuosity of the  stomach within the hernia sac. Stomach is prominently distended with associated air-fluid levels. Hepatobiliary: No focal liver abnormality is seen. No gallstones, gallbladder wall thickening, or biliary dilatation. Pancreas: Partially infiltrated with fat but otherwise unremarkable. The pancreatic head and body are displaced rightward by a suspected internal hernia. Spleen: Normal in size without focal abnormality. Adrenals/Urinary Tract: Adrenal glands appear normal. Kidneys are unremarkable without mass, stone or hydronephrosis. No ureteral or bladder calculi identified. Bladder appears normal, partially decompressed. Stomach/Bowel: Majority of the small and large bowel is decompressed. The prominent hiatal hernia also includes a portion of the transverse colon and upper LEFT colon. There is scattered mild diverticulosis without evidence of acute diverticulitis. As above, the stomach is significantly distended, with associated air-fluid levels. There is an abrupt transition zone at the level of the proximal duodenum, probable mechanical obstruction. Swirling of the RIGHT upper quadrant mesentery raises the possibility of internal hernia as the cause of the apparent obstruction. Vascular/Lymphatic: Aortic atherosclerosis. No enlarged abdominal or pelvic lymph nodes. Reproductive: Prostate is unremarkable. Other: No free fluid or abscess collections seen. No free intraperitoneal air. Musculoskeletal: Advanced degenerative change throughout the thoracolumbar spine and at the LEFT hip. Probable ankylosing spondylitis of the thoracic spine. No acute or suspicious osseous finding. IMPRESSION: 1. Large hiatal hernia containing the majority of the stomach and a portion of the colon. Stomach is prominently distended, with associated air-fluid levels, and with a tortuous configuration. There is abrupt transition to a decompressed proximal descending duodenum, with swirling of the surrounding RIGHT upper quadrant  mesentery  raising the possibility of internal hernia causing the apparent mechanical bowel obstruction. Remainder of the small and large bowel is relatively decompressed. 2. Colonic diverticulosis without evidence of acute diverticulitis. 3. Aortic atherosclerosis. 4. Additional chronic/incidental findings detailed above These results were called by telephone at the time of interpretation on 09/16/2017 at 3:50 pm to Dr. Nanda Quinton , who verbally acknowledged these results. Electronically Signed   By: Franki Cabot M.D.   On: 09/16/2017 15:55   Dg Abdomen 1 View  Result Date: 09/16/2017 CLINICAL DATA:  Gastric tube placement EXAM: ABDOMEN - 1 VIEW COMPARISON:  None. FINDINGS: Targeted study of the left upper quadrant of the abdomen was provided. Malpositioned gastric tube is noted with tip projecting over the medial right lung base possibly along the right mainstem bronchus. Cardiomegaly with small left effusion is present. There appears be gastric distention with air. Thoracic and upper lumbar syndesmophytes are noted with osteophytes of the mid to lower included lumbar spine. IMPRESSION: 1. Malpositioned gastric tube projecting over the medial right lung base, possibly within right mainstem bronchus. Repositioning is recommended by withdrawing and reinserting. 2. Cardiomegaly with small left effusion. 3. Gastric distention is suggested as well Electronically Signed   By: Ashley Royalty M.D.   On: 09/16/2017 17:39   Dg Abdomen Acute W/chest  Result Date: 09/16/2017 CLINICAL DATA:  Distended abdomen. EXAM: DG ABDOMEN ACUTE W/ 1V CHEST COMPARISON:  CT 04/18/2017 FINDINGS: Large left diaphragmatic hernia is stable dating back to prior CT. Air-fluid level in the left lower chest likely within the stomach. Suspect mild cardiomegaly although the heart is difficult to visualize. No focal airspace opacities or effusions. Nonobstructive bowel gas pattern. No organomegaly or suspicious calcification. No acute bony abnormality.  IMPRESSION: Large left diaphragmatic hernia again noted as seen on prior CTs. No obstruction or free air. No acute cardiopulmonary disease. Electronically Signed   By: Rolm Baptise M.D.   On: 09/16/2017 14:27   Dg Intro Long Gi Tube  Result Date: 09/17/2017 CLINICAL DATA:  Recurrent hiatal hernia.  Gastric obstruction. EXAM: INTRO LONG GI TUBE CONTRAST:  41mL ISOVUE-300 IOPAMIDOL (ISOVUE-300) INJECTION 61% FLUOROSCOPY TIME:  Fluoroscopy Time:  10 minutes and 12 seconds Radiation Exposure Index (if provided by the fluoroscopic device): 221 mGy Number of Acquired Spot Images: 0 COMPARISON:  CT of 09/16/2017 FINDINGS: Attempt was made to place the nasogastric tube into the thoracic stomach. Nasogastric tube was placed into the esophagus with location confirmed with a small amount of contrast. Despite multiple maneuvers, including placement of an Amplatz wire, the gastroesophageal junction could not be traversed. After discussion with Dr. Harlow Asa, the nasogastric tube was removed. IMPRESSION: Unsuccessful nasogastric tube placement, as detailed above. Placement was complicated by the large hiatal hernia and tortuosity/angulation at the gastroesophageal junction. Electronically Signed   By: Abigail Miyamoto M.D.   On: 09/17/2017 07:16    Medications:  Scheduled: . chlorhexidine  15 mL Mouth Rinse BID  . mouth rinse  15 mL Mouth Rinse q12n4p  . mupirocin ointment  1 application Nasal BID   Continuous: . dextrose 5 % and 0.45 % NaCl with KCl 20 mEq/L 100 mL/hr at 09/16/17 2200  . famotidine (PEPCID) IV Stopped (09/17/17 0000)    Assessment/Plan: 1) Large hiatal hernia. 2) Nausea/vomiting - resolved.   He feels well.  Again, he had 3.1 liters of gastric contents suctioned out.  The NG tube appears to be functioning.  Plan: 1) Management per Surgery. 2) Signing off.  LOS: 1  day   Jameal Razzano D 09/17/2017, 7:43 AM

## 2017-09-17 NOTE — Plan of Care (Signed)
Patient stable, without complaint other than some mild irritation to nose from NG tube.  Denies nausea, vomiting or pain in abdomen.  Sat up in chair for much of shift, friend at bedside.

## 2017-09-18 ENCOUNTER — Encounter (HOSPITAL_COMMUNITY): Payer: Self-pay | Admitting: Gastroenterology

## 2017-09-18 ENCOUNTER — Inpatient Hospital Stay (HOSPITAL_COMMUNITY): Payer: Medicare Other

## 2017-09-18 MED ORDER — IOPAMIDOL (ISOVUE-300) INJECTION 61%
INTRAVENOUS | Status: AC
  Administered 2017-09-18: 30 mL via NASOGASTRIC
  Filled 2017-09-18: qty 100

## 2017-09-18 MED ORDER — IOPAMIDOL (ISOVUE-300) INJECTION 61%
INTRAVENOUS | Status: AC
  Administered 2017-09-18: 60 mL via NASOGASTRIC
  Filled 2017-09-18: qty 50

## 2017-09-18 NOTE — Progress Notes (Addendum)
2 Days Post-Op    CC:  Nausea and vomiting  Subjective: Pt just back from the UGI.  NG working well and has another 1/2 cannister full of the NG drainage.     Objective: Vital signs in last 24 hours: Temp:  [98.6 F (37 C)-99.4 F (37.4 C)] 99.1 F (37.3 C) (06/17 0427) Pulse Rate:  [70-75] 75 (06/17 0427) Resp:  [17-18] 18 (06/17 0427) BP: (116-124)/(72-73) 116/73 (06/16 2026) SpO2:  [91 %-96 %] 93 % (06/17 0427) Last BM Date: 09/16/17 2500 IV 425 urine 400 NG recorded Afebrile, VSS No labs UGI:  Scout view of the abdomen shows a nasogastric tube coiled in a hiatal hernia. Bowel gas pattern is otherwise unremarkable.  Water-soluble contrast was injected through the patient's nasogastric tube. A large hiatal hernia was immediately opacified with eventual filling of the distal stomach.   Intake/Output from previous day: 06/16 0701 - 06/17 0700 In: 2538.3 [I.V.:2400; IV Piggyback:138.3] Out: 825 [Urine:425; Emesis/NG output:400] Intake/Output this shift: Total I/O In: -  Out: 200 [Urine:200]  General appearance: alert, cooperative and no distress Resp: clear to auscultation bilaterally GI: soft, no BS, NG in place. No real distress now, NG working well.  Lab Results:  Recent Labs    09/16/17 1311  WBC 14.6*  HGB 16.0  HCT 47.6  PLT 243    BMET Recent Labs    09/16/17 1311  NA 141  K 3.8  CL 104  CO2 24  GLUCOSE 156*  BUN 21*  CREATININE 0.99  CALCIUM 9.9   PT/INR No results for input(s): LABPROT, INR in the last 72 hours.  Recent Labs  Lab 09/16/17 1311  AST 35  ALT 16*  ALKPHOS 180*  BILITOT 0.8  PROT 7.8  ALBUMIN 4.3     Lipase     Component Value Date/Time   LIPASE 31 09/16/2017 1311   Prior to Admission medications   Medication Sig Start Date End Date Taking? Authorizing Provider  acetaminophen (TYLENOL) 500 MG tablet Take 1,000 mg by mouth daily.    Yes [provider]  Calcium Carbonate-Vitamin D (CALCIUM 600/VITAMIN D  PO) Take 1 tablet by mouth daily.   Yes [provider]  Diphenhydramine-APAP, sleep, (TYLENOL PM EXTRA STRENGTH PO) Take 1 tablet by mouth as needed (pain/sleep).    Yes [provider]  Docusate Calcium (STOOL SOFTENER PO) Take 1 tablet by mouth as needed (constipation).    Yes [provider]  Leuprolide Acetate (LUPRON IJ) Inject 0.2 mLs into the skin every 4 (four) months.   Yes [provider]  losartan (COZAAR) 100 MG tablet Take 1 tablet (100 mg total) by mouth daily. 04/05/17  Yes [name], [name], MD  naphazoline-pheniramine (NAPHCON-A) 0.025-0.3 % ophthalmic solution Place 1 drop into both eyes daily.   Yes [provider]      Medications: . iopamidol      . chlorhexidine  15 mL Mouth Rinse BID  . mouth rinse  15 mL Mouth Rinse q12n4p   . dextrose 5 % and 0.45 % NaCl with KCl 20 mEq/L 100 mL/hr at 09/18/17 0426  . famotidine (PEPCID) IV Stopped (09/17/17 2218)   Anti-infectives (From admission, onward)   None      Assessment/Plan GERD/gastric ulcer hx Hypertension Hx of prostate ca with radiation Rx - Lupron RX -  Recurrent hiatal hernia with gastric outlet obstruction, 2nd portion of the duodenum Type IV hiatal hernia with volvulus - prior gastropexy reduced to an adhesion NG  placement by endoscopy    FEN:  NPO/NG in place ID:  None DVT:  SCDs Follow up:  TBD   Plan:  Pt needs a Gastrotomy tube to secure the stomach in the abdomen.  He is not a candidate for reoperation for his  hiatal hernia.  Will discuss with GI/IR.  If neither can do it then we would have to do it in the OR.      LOS: 2 days    Jorge [name] 09/18/2017 [phone]

## 2017-09-19 ENCOUNTER — Inpatient Hospital Stay (HOSPITAL_COMMUNITY): Payer: Medicare Other

## 2017-09-19 ENCOUNTER — Encounter (HOSPITAL_COMMUNITY): Payer: Self-pay | Admitting: Radiology

## 2017-09-19 NOTE — Progress Notes (Signed)
3 Days Post-Op    CC:  Nausea and vomiting  Subjective: Pt says Radiology has been in and they plan to do something today, no note in chart yet.   Objective: Vital signs in last 24 hours: Temp:  [98 F (36.7 C)-99.5 F (37.5 C)] 98.9 F (37.2 C) (06/18 0453) Pulse Rate:  [69-78] 76 (06/18 0453) Resp:  [18] 18 (06/18 0453) BP: (146-176)/(78-91) 156/91 (06/18 0453) SpO2:  [92 %-96 %] 96 % (06/18 0453) Last BM Date: 09/16/17  2500 IV 575 urine 1150 NG NO Bm recorded Afebrile, BP up some No labs - will recheck UGI 09/18/17:  Scout view of the abdomen shows a nasogastric tube coiled in a hiatal hernia. Bowel gas pattern is otherwise unremarkable.  Water-soluble contrast was injected through the patient's nasogastric tube. A large hiatal hernia was immediately opacified with eventual filling of the distal stomach.    Intake/Output from previous day: 06/17 0701 - 06/18 0700 In: 2501.7 [I.V.:2400; IV Piggyback:101.7] Out: 1725 [Urine:575; Emesis/NG output:1150] Intake/Output this shift: No intake/output data recorded.  General appearance: alert, cooperative and no distress Resp: clear to auscultation bilaterally GI: soft, NG in place, still draining green fluid from NG.  BS hypoactive, No or not much flatus, no BM.    Lab Results:  Recent Labs    09/16/17 1311  WBC 14.6*  HGB 16.0  HCT 47.6  PLT 243    BMET Recent Labs    09/16/17 1311  NA 141  K 3.8  CL 104  CO2 24  GLUCOSE 156*  BUN 21*  CREATININE 0.99  CALCIUM 9.9   PT/INR No results for input(s): LABPROT, INR in the last 72 hours.  Recent Labs  Lab 09/16/17 1311  AST 35  ALT 16*  ALKPHOS 180*  BILITOT 0.8  PROT 7.8  ALBUMIN 4.3     Lipase     Component Value Date/Time   LIPASE 31 09/16/2017 1311     Medications: . chlorhexidine  15 mL Mouth Rinse BID  . mouth rinse  15 mL Mouth Rinse q12n4p   . dextrose 5 % and 0.45 % NaCl with KCl 20 mEq/L 100 mL/hr at 09/18/17 1432  . famotidine  (PEPCID) IV Stopped (09/18/17 2122)   Assessment/Plan GERD/gastric ulcer hx Hypertension Hx of prostate ca with radiation Rx - Lupron RX -  Recurrent hiatal hernia with gastric outlet obstruction, 2nd portion of the duodenum Type IV hiatal hernia with volvulus - prior gastropexy reduced to an adhesion NG placement by endoscopy    FEN:  NPO/NG in place/IV fluids ID:  None DVT:  SCDs Follow up:  TBD  Plan:  Anticipate IR trying to place Gastrostomy tube today.         LOS: 3 days    Chia Mowers 09/19/2017 [phone]

## 2017-09-19 NOTE — Care Management Important Message (Signed)
Important Message  Patient Details  Name: Jorge [name] MRN: [id number] Date of Birth: [birth year]   Medicare Important Message Given:  Yes    Kerin Salen 09/19/2017, 11:23 AMImportant Message  Patient Details  Name: Jorge [name] MRN: [id number] Date of Birth: 12/20/1929   Medicare Important Message Given:  Yes    Kerin Salen 09/19/2017, 11:23 AM

## 2017-09-20 ENCOUNTER — Encounter (HOSPITAL_COMMUNITY): Payer: Self-pay | Admitting: General Practice

## 2017-09-20 ENCOUNTER — Inpatient Hospital Stay (HOSPITAL_COMMUNITY): Payer: Medicare Other | Admitting: Anesthesiology

## 2017-09-20 ENCOUNTER — Encounter (HOSPITAL_COMMUNITY): Admission: EM | Disposition: A | Discharge status: Home / Self Care | Payer: Self-pay | Source: Home / Self Care

## 2017-09-20 HISTORY — PX: GASTROSTOMY: SHX5249

## 2017-09-20 LAB — BASIC METABOLIC PANEL
Anion gap: 5 (ref 5–15)
BUN: 11 mg/dL (ref 6–20)
CO2: 26 mmol/L (ref 22–32)
Calcium: 8.9 mg/dL (ref 8.9–10.3)
Chloride: 107 mmol/L (ref 101–111)
Creatinine, Ser: 0.82 mg/dL (ref 0.61–1.24)
GFR calc Af Amer: >60 mL/min (ref >60)
GFR calc non Af Amer: >60 mL/min (ref >60)
Glucose, Bld: 126 mg/dL — ABNORMAL HIGH (ref 65–99)
Potassium: 4.2 mmol/L (ref 3.5–5.1)
SODIUM: 138 mmol/L (ref 135–145)

## 2017-09-20 LAB — PROTIME-INR
INR: 0.99
Prothrombin Time: 13 seconds (ref 11.4–15.2)

## 2017-09-20 LAB — CBC
HCT: 41.8 % (ref 39.0–52.0)
Hemoglobin: 14 g/dL (ref 13.0–17.0)
MCH: 32.7 pg (ref 26.0–34.0)
MCHC: 33.5 g/dL (ref 30.0–36.0)
MCV: 97.7 fL (ref 78.0–100.0)
Platelets: 213 10*3/uL (ref 150–400)
RBC: 4.28 MIL/uL (ref 4.22–5.81)
RDW: 12.8 % (ref 11.5–15.5)
WBC: 7.7 10*3/uL (ref 4.0–10.5)

## 2017-09-20 SURGERY — INSERTION OF GASTROSTOMY TUBE
Anesthesia: General | Site: Abdomen

## 2017-09-20 MED ORDER — BUPIVACAINE HCL (PF) 0.5 % IJ SOLN
INTRAMUSCULAR | Status: DC | PRN
  Administered 2017-09-20: 20 mL

## 2017-09-20 MED ORDER — PROPOFOL 10 MG/ML IV BOLUS
INTRAVENOUS | Status: AC
  Filled 2017-09-20: qty 20

## 2017-09-20 MED ORDER — BUPIVACAINE LIPOSOME 1.3 % IJ SUSP
INTRAMUSCULAR | Status: DC | PRN
  Administered 2017-09-20: 10 mL

## 2017-09-20 MED ORDER — FENTANYL CITRATE (PF) 100 MCG/2ML IJ SOLN
INTRAMUSCULAR | Status: AC
  Administered 2017-09-20: 100 ug via INTRAVENOUS
  Filled 2017-09-20: qty 2

## 2017-09-20 MED ORDER — ROCURONIUM BROMIDE 10 MG/ML (PF) SYRINGE: PREFILLED_SYRINGE | INTRAVENOUS | Status: DC | PRN

## 2017-09-20 MED ORDER — MEPERIDINE HCL 50 MG/ML IJ SOLN: 6.2500 mg | INTRAMUSCULAR | Status: DC | PRN

## 2017-09-20 MED ORDER — MORPHINE SULFATE (PF) 4 MG/ML IV SOLN
1.0000 mg | INTRAVENOUS | Status: DC | PRN
  Administered 2017-09-20: 1 mg via INTRAVENOUS

## 2017-09-20 MED ORDER — SUCCINYLCHOLINE CHLORIDE 200 MG/10ML IV SOSY
PREFILLED_SYRINGE | INTRAVENOUS | Status: AC
  Filled 2017-09-20: qty 10

## 2017-09-20 MED ORDER — FENTANYL CITRATE (PF) 100 MCG/2ML IJ SOLN
INTRAMUSCULAR | Status: AC
  Administered 2017-09-20: 50 ug via INTRAVENOUS
  Filled 2017-09-20: qty 2

## 2017-09-20 MED ORDER — DEXAMETHASONE SODIUM PHOSPHATE 10 MG/ML IJ SOLN
INTRAMUSCULAR | Status: AC
Start: 2017-09-20 — End: ?
  Filled 2017-09-20: qty 1

## 2017-09-20 MED ORDER — PROPOFOL 10 MG/ML IV BOLUS
INTRAVENOUS | Status: DC | PRN
  Administered 2017-09-20: 25 mg via INTRAVENOUS
  Administered 2017-09-20: 150 mg via INTRAVENOUS
  Administered 2017-09-20: 25 mg via INTRAVENOUS

## 2017-09-20 MED ORDER — LACTATED RINGERS IV SOLN
INTRAVENOUS | Status: DC | PRN
  Administered 2017-09-20: 14:00:00 via INTRAVENOUS

## 2017-09-20 MED ORDER — SUCCINYLCHOLINE CHLORIDE 200 MG/10ML IV SOSY
PREFILLED_SYRINGE | INTRAVENOUS | Status: DC | PRN
  Administered 2017-09-20: 120 mg via INTRAVENOUS

## 2017-09-20 MED ORDER — LIDOCAINE 2% (20 MG/ML) 5 ML SYRINGE
INTRAMUSCULAR | Status: DC | PRN
  Administered 2017-09-20: 100 mg via INTRAVENOUS

## 2017-09-20 MED ORDER — FENTANYL CITRATE (PF) 100 MCG/2ML IJ SOLN
INTRAMUSCULAR | Status: AC
  Filled 2017-09-20: qty 2

## 2017-09-20 MED ORDER — 0.9 % SODIUM CHLORIDE (POUR BTL) OPTIME
TOPICAL | Status: DC | PRN
  Administered 2017-09-20: 2000 mL

## 2017-09-20 MED ORDER — ACETAMINOPHEN 10 MG/ML IV SOLN
1000.0000 mg | Freq: Four times a day (QID) | INTRAVENOUS | Status: AC
  Administered 2017-09-20 – 2017-09-21 (×4): 1000 mg via INTRAVENOUS
  Filled 2017-09-20 (×3): qty 100

## 2017-09-20 MED ORDER — SUGAMMADEX SODIUM 200 MG/2ML IV SOLN
INTRAVENOUS | Status: DC | PRN
  Administered 2017-09-20: 200 mg via INTRAVENOUS

## 2017-09-20 MED ORDER — CEFAZOLIN SODIUM-DEXTROSE 2-4 GM/100ML-% IV SOLN: 2.0000 g | INTRAVENOUS | Status: DC

## 2017-09-20 MED ORDER — ROCURONIUM BROMIDE 100 MG/10ML IV SOLN
INTRAVENOUS | Status: AC
  Filled 2017-09-20: qty 1

## 2017-09-20 MED ORDER — ROCURONIUM BROMIDE 100 MG/10ML IV SOLN
INTRAVENOUS | Status: DC | PRN
  Administered 2017-09-20: 50 mg via INTRAVENOUS
  Administered 2017-09-20: 10 mg via INTRAVENOUS

## 2017-09-20 MED ORDER — MORPHINE SULFATE (PF) 4 MG/ML IV SOLN
INTRAVENOUS | Status: AC
  Filled 2017-09-20: qty 1

## 2017-09-20 MED ORDER — LACTATED RINGERS IV SOLN
Freq: Once | INTRAVENOUS | Status: AC
  Administered 2017-09-20: 13:00:00 via INTRAVENOUS

## 2017-09-20 MED ORDER — FENTANYL CITRATE (PF) 100 MCG/2ML IJ SOLN
25.0000 ug | INTRAMUSCULAR | Status: DC | PRN
  Administered 2017-09-20 (×3): 50 ug via INTRAVENOUS
  Administered 2017-09-20: 100 ug via INTRAVENOUS

## 2017-09-20 MED ORDER — MORPHINE SULFATE (PF) 2 MG/ML IV SOLN
1.0000 mg | INTRAVENOUS | Status: DC | PRN
  Administered 2017-09-20 (×4): 2 mg via INTRAVENOUS
  Filled 2017-09-20 (×4): qty 1

## 2017-09-20 MED ORDER — FENTANYL CITRATE (PF) 250 MCG/5ML IJ SOLN
INTRAMUSCULAR | Status: AC
Start: 2017-09-20 — End: ?
  Filled 2017-09-20: qty 5

## 2017-09-20 MED ORDER — FENTANYL CITRATE (PF) 250 MCG/5ML IJ SOLN
INTRAMUSCULAR | Status: DC | PRN
  Administered 2017-09-20: 100 ug via INTRAVENOUS
  Administered 2017-09-20: 50 ug via INTRAVENOUS

## 2017-09-20 MED ORDER — ACETAMINOPHEN 10 MG/ML IV SOLN
INTRAVENOUS | Status: AC
  Administered 2017-09-20: 1000 mg via INTRAVENOUS
  Filled 2017-09-20: qty 100

## 2017-09-20 MED ORDER — ONDANSETRON HCL 4 MG/2ML IJ SOLN
INTRAMUSCULAR | Status: AC
  Filled 2017-09-20: qty 2

## 2017-09-20 MED ORDER — CIPROFLOXACIN IN D5W 400 MG/200ML IV SOLN
400.0000 mg | INTRAVENOUS | Status: AC
  Administered 2017-09-20: 400 mg via INTRAVENOUS
  Filled 2017-09-20: qty 200

## 2017-09-20 MED ORDER — DEXAMETHASONE SODIUM PHOSPHATE 10 MG/ML IJ SOLN
INTRAMUSCULAR | Status: DC | PRN
  Administered 2017-09-20: 10 mg via INTRAVENOUS

## 2017-09-20 MED ORDER — ONDANSETRON HCL 4 MG/2ML IJ SOLN
INTRAMUSCULAR | Status: DC | PRN
  Administered 2017-09-20: 4 mg via INTRAVENOUS

## 2017-09-20 SURGICAL SUPPLY — 81 items
APPLIER CLIP 5 13 M/L LIGAMAX5 (MISCELLANEOUS)
APPLIER CLIP ROT 10 11.4 M/L (STAPLE)
BAG URINE DRAINAGE (UROLOGICAL SUPPLIES) IMPLANT
BLADE EXTENDED COATED 6.5IN (ELECTRODE) ×2 IMPLANT
BLADE HEX COATED 2.75 (ELECTRODE) IMPLANT
CATH BOLUS GASTRO 22FR (CATHETERS) ×2 IMPLANT
CATH FOLEY 2WAY SLVR  5CC 14FR (CATHETERS)
CATH FOLEY 2WAY SLVR  5CC 16FR (CATHETERS)
CATH FOLEY 2WAY SLVR  5CC 18FR (CATHETERS)
CATH FOLEY 2WAY SLVR  5CC 20FR (CATHETERS)
CATH FOLEY 2WAY SLVR 5CC 14FR (CATHETERS) IMPLANT
CATH FOLEY 2WAY SLVR 5CC 16FR (CATHETERS) IMPLANT
CATH FOLEY 2WAY SLVR 5CC 18FR (CATHETERS) IMPLANT
CATH FOLEY 2WAY SLVR 5CC 20FR (CATHETERS) IMPLANT
CHLORAPREP W/TINT 26ML (MISCELLANEOUS) ×2 IMPLANT
CLIP APPLIE 5 13 M/L LIGAMAX5 (MISCELLANEOUS) IMPLANT
CLIP APPLIE ROT 10 11.4 M/L (STAPLE) IMPLANT
COVER SURGICAL LIGHT HANDLE (MISCELLANEOUS) ×2 IMPLANT
DECANTER SPIKE VIAL GLASS SM (MISCELLANEOUS) IMPLANT
DISSECTOR ROUND CHERRY 3/8 STR (MISCELLANEOUS) IMPLANT
DRAIN CHANNEL RND F F (WOUND CARE) IMPLANT
DRAPE SHEET LG 3/4 BI-LAMINATE (DRAPES) IMPLANT
DRAPE WARM FLUID 44X44 (DRAPE) ×2 IMPLANT
DRESSING TELFA ISLAND 4X8 (GAUZE/BANDAGES/DRESSINGS) IMPLANT
DRSG TELFA 3X8 NADH (GAUZE/BANDAGES/DRESSINGS) IMPLANT
DRSG TELFA 4X10 ISLAND STR (GAUZE/BANDAGES/DRESSINGS) IMPLANT
DRSG TELFA PLUS 4X6 ADH ISLAND (GAUZE/BANDAGES/DRESSINGS) IMPLANT
EVACUATOR SILICONE 100CC (DRAIN) IMPLANT
GAUZE 4X4 16PLY RFD (DISPOSABLE) ×2 IMPLANT
GAUZE SPONGE 4X4 12PLY STRL (GAUZE/BANDAGES/DRESSINGS) ×2 IMPLANT
GLOVE BIO SURGEON STRL SZ 6 (GLOVE) ×2 IMPLANT
GLOVE INDICATOR 6.5 STRL GRN (GLOVE) ×4 IMPLANT
GOWN STRL REUS W/ TWL XL LVL3 (GOWN DISPOSABLE) ×3 IMPLANT
GOWN STRL REUS W/TWL 2XL LVL3 (GOWN DISPOSABLE) ×2 IMPLANT
GOWN STRL REUS W/TWL LRG LVL3 (GOWN DISPOSABLE) IMPLANT
GOWN STRL REUS W/TWL XL LVL3 (GOWN DISPOSABLE) ×3
HANDLE SUCTION POOLE (INSTRUMENTS) ×1 IMPLANT
HEMOSTAT SURGICEL 4X8 (HEMOSTASIS) IMPLANT
IRRIG SUCT STRYKERFLOW 2 WTIP (MISCELLANEOUS)
IRRIGATION SUCT STRKRFLW 2 WTP (MISCELLANEOUS) IMPLANT
KIT BASIN OR (CUSTOM PROCEDURE TRAY) ×2 IMPLANT
NEEDLE BIOPSY 14GX4.5 SOFT TIS (NEEDLE) IMPLANT
NEEDLE BIOPSY 14X6 SOFT TISS (NEEDLE) IMPLANT
NEEDLE HYPO 22GX1.5 SAFETY (NEEDLE) IMPLANT
NS IRRIG 1000ML POUR BTL (IV SOLUTION) ×4 IMPLANT
PACK GENERAL/GYN (CUSTOM PROCEDURE TRAY) ×2 IMPLANT
PACK UNIVERSAL I (CUSTOM PROCEDURE TRAY) ×2 IMPLANT
PLUG CATH AND CAP STER (CATHETERS) IMPLANT
SEPRAFILM MEMBRANE 5X6 (MISCELLANEOUS) ×2 IMPLANT
SHEARS FOC LG CVD HARMONIC 17C (MISCELLANEOUS) IMPLANT
SLEEVE SURGEON STRL (DRAPES) IMPLANT
SOLUTION ANTI FOG 6CC (MISCELLANEOUS) IMPLANT
SPONGE LAP 18X18 RF (DISPOSABLE) IMPLANT
STAPLER VISISTAT 35W (STAPLE) ×2 IMPLANT
SUCTION POOLE HANDLE (INSTRUMENTS) ×2
SUT ETHILON 2 0 PS N (SUTURE) ×4 IMPLANT
SUT MNCRL AB 4-0 PS2 18 (SUTURE) IMPLANT
SUT PDS AB 1 TP1 96 (SUTURE) ×4 IMPLANT
SUT SILK 2 0 (SUTURE) ×1
SUT SILK 2 0 SH CR/8 (SUTURE) ×2 IMPLANT
SUT SILK 2-0 18XBRD TIE 12 (SUTURE) ×1 IMPLANT
SUT SILK 3 0 SH CR/8 (SUTURE) ×2 IMPLANT
SUT VIC AB 2-0 SH 27 (SUTURE) ×1
SUT VIC AB 2-0 SH 27X BRD (SUTURE) ×1 IMPLANT
SUT VICRYL 2 0 18  UND BR (SUTURE)
SUT VICRYL 2 0 18 UND BR (SUTURE) IMPLANT
SUT VICRYL 3 0 BR 18  UND (SUTURE)
SUT VICRYL 3 0 BR 18 UND (SUTURE) IMPLANT
SYR CONTROL 10ML LL (SYRINGE) ×2 IMPLANT
SYRINGE IRR TOOMEY STRL 70CC (SYRINGE) ×2 IMPLANT
SYS LAPSCP GELPORT 120MM (MISCELLANEOUS)
SYSTEM LAPSCP GELPORT 120MM (MISCELLANEOUS) IMPLANT
TAPE CLOTH SURG 6X10 WHT LF (GAUZE/BANDAGES/DRESSINGS) ×2 IMPLANT
TOWEL OR 17X26 10 PK STRL BLUE (TOWEL DISPOSABLE) ×2 IMPLANT
TRAY FOLEY MTR SLVR 16FR STAT (SET/KITS/TRAYS/PACK) ×2 IMPLANT
TROCAR XCEL 12X100 BLDLESS (ENDOMECHANICALS) IMPLANT
TROCAR XCEL BLUNT TIP 100MML (ENDOMECHANICALS) IMPLANT
TROCAR XCEL NON-BLD 11X100MML (ENDOMECHANICALS) IMPLANT
TROCAR XCEL UNIV SLVE 11M 100M (ENDOMECHANICALS) IMPLANT
TUBING INSUF HEATED (TUBING) IMPLANT
YANKAUER SUCT BULB TIP NO VENT (SUCTIONS) IMPLANT

## 2017-09-20 NOTE — Progress Notes (Signed)
Assisted Dr. Janeece Riggers with tap block , ultra-sound guided left side  . Side rails up, monitors on throughout procedure. See vital signs in flow sheet. Tolerated Procedure well.

## 2017-09-20 NOTE — Anesthesia Procedure Notes (Signed)
Anesthesia Regional Block: TAP block   Pre-Anesthetic Checklist: ,, timeout performed, Correct Patient, Correct Site, Correct Laterality, Correct Procedure, Correct Position, site marked, Risks and benefits discussed,  Surgical consent,  Pre-op evaluation,  At surgeon's request and post-op pain management  Laterality: Left  Prep: chloraprep       Needles:  Injection technique: Single-shot  Needle Type: Echogenic Stimulator Needle     Needle Length: 5cm  Needle Gauge: 22     Additional Needles:   Procedures:, nerve stimulator,,, ultrasound used (permanent image in chart),,,,  Narrative:  Start time: 09/20/2017 1:00 PM End time: 09/20/2017 1:05 PM Injection made incrementally with aspirations every 5 mL.  Performed by: Personally  Anesthesiologist: Janeece Riggers, MD  Additional Notes: Functioning IV was confirmed and monitors were applied.  A 25mm 22ga Arrow echogenic stimulator needle was used. Sterile prep and drape,hand hygiene and sterile gloves were used. Ultrasound guidance: relevant anatomy identified, needle position confirmed, local anesthetic spread visualized around nerve(s)., vascular puncture avoided.  Image printed for medical record. Negative aspiration and negative test dose prior to incremental administration of local anesthetic. The patient tolerated the procedure well.

## 2017-09-20 NOTE — Transfer of Care (Signed)
Immediate Anesthesia Transfer of Care Note  Patient: Jorge [name]  Procedure(s) Performed: INSERTION OF GASTROSTOMY TUBE (N/A Abdomen)  Patient Location: PACU  Anesthesia Type:General  Level of Consciousness: awake  Airway & Oxygen Therapy: Patient Spontanous Breathing and Patient connected to face mask oxygen  Post-op Assessment: Report given to RN and Post -op Vital signs reviewed and stable  Post vital signs: Reviewed and stable  Last Vitals:  Vitals Value Taken Time  BP 189/83 09/20/2017  3:10 PM  Temp    Pulse 72 09/20/2017  3:11 PM  Resp 20 09/20/2017  3:11 PM  SpO2 98 % 09/20/2017  3:11 PM  Vitals shown include unvalidated device data.  Last Pain:  Vitals:   09/20/17 0834  TempSrc:   PainSc: 0-No pain      Patients Stated Pain Goal: 2 (79/53/69 2230)  Complications: No apparent anesthesia complications

## 2017-09-20 NOTE — Anesthesia Preprocedure Evaluation (Addendum)
Anesthesia Evaluation  Patient identified by MRN, date of birth, ID band Patient awake    Reviewed: Allergy & Precautions, NPO status , Patient's Chart, lab work & pertinent test results  Airway Mallampati: I  TM Distance: >3 FB Neck ROM: Full    Dental  (+) Teeth Intact, Upper Dentures, Partial Lower, Dental Advisory Given   Pulmonary former smoker,    Pulmonary exam normal breath sounds clear to auscultation       Cardiovascular hypertension, Pt. on medications Normal cardiovascular exam Rhythm:Regular Rate:Normal  Echo 3/13 Study Conclusions  - Left ventricle: The cavity size was normal. Systolic function was mildly reduced. The estimated ejection fraction was 55%. Wall motion was normal; there were no regional wall motion abnormalities. There was an increased relative contribution of atrial contraction to ventricular filling. Doppler parameters are consistent with abnormal left ventricular relaxation (grade 1 diastolic dysfunction).   Neuro/Psych Anxiety  Neuromuscular disease    GI/Hepatic hiatal hernia, GERD  Medicated and Controlled,  Endo/Other    Renal/GU      Musculoskeletal  (+) Arthritis ,   Abdominal   Peds  Hematology   Anesthesia Other Findings CT scan demonstrated a recurrent hiatal hernia with stomach markedly distended and extending into the left chest with obstruction of the second portion of the duodenum  Reproductive/Obstetrics                            Anesthesia Physical  Anesthesia Plan  ASA: III  Anesthesia Plan: General   Post-op Pain Management:    Induction: Intravenous and Cricoid pressure planned  PONV Risk Score and Plan: 1 and Ondansetron and Treatment may vary due to age or medical condition  Airway Management Planned: Oral ETT  Additional Equipment:   Intra-op Plan:   Post-operative Plan: Extubation in OR  Informed  Consent: I have reviewed the patients History and Physical, chart, labs and discussed the procedure including the risks, benefits and alternatives for the proposed anesthesia with the patient or authorized representative who has indicated his/her understanding and acceptance.   Dental advisory given  Plan Discussed with: CRNA, Surgeon and Anesthesiologist  Anesthesia Plan Comments: (  )        Anesthesia Quick Evaluation

## 2017-09-20 NOTE — Anesthesia Procedure Notes (Signed)
Procedure Name: Intubation Date/Time: 09/20/2017 1:41 PM Performed by: Sharlette Dense, CRNA Patient Re-evaluated:Patient Re-evaluated prior to induction Oxygen Delivery Method: Circle system utilized Preoxygenation: Pre-oxygenation with 100% oxygen Induction Type: IV induction, Rapid sequence and Cricoid Pressure applied Laryngoscope Size: Miller and 3 Grade View: Grade I Tube type: Oral Tube size: 8.0 mm Number of attempts: 1 Airway Equipment and Method: Stylet Placement Confirmation: ETT inserted through vocal cords under direct vision,  positive ETCO2 and breath sounds checked- equal and bilateral Secured at: 23 cm Tube secured with: Tape Dental Injury: Teeth and Oropharynx as per pre-operative assessment

## 2017-09-20 NOTE — Op Note (Signed)
PRE-OPERATIVE DIAGNOSIS: Recurrent hiatal hernia with gastric outlet obstruction.  POST-OPERATIVE DIAGNOSIS:  Same  PROCEDURE:  Procedure(s): Open gastrostomy tube, 22 Fr Moss  SURGEON:  Surgeon(s): Stark Klein, MD  ASSISTANT: Modena Jansky, PA-C  ANESTHESIA:   general and TAP block  DRAINS: Gastrostomy Tube   LOCAL MEDICATIONS USED:  OTHER per anesthesia  SPECIMEN:  No Specimen  DISPOSITION OF SPECIMEN:  N/A  COUNTS:  YES  DICTATION: .Dragon Dictation  PLAN OF CARE: return to floor bed  PATIENT DISPOSITION:  PACU - hemodynamically stable.  FINDINGS:  Stomach partially in chest.  Site of prior obstruction visible near pylorus.  Transverse colon and splenic flexure behind stomach in chest.  Bands of tissue stretched between stomach and abdominal wall from prior gastropexy sutures.    EBL: min  PROCEDURE:  Patient was identified in the holding area and taken to the operating room where he was placed supine on operating room table.  General endotracheal anesthesia was induced.  His abdomen was prepped and draped in sterile fashion after a Foley catheter was placed.  A timeout was performed according to the surgical safety checklist.  When all was correct, we continued.  An upper midline incision was made approximately 9 to 10 cm in length just below the xiphoid.  The subcutaneous tissues were divided with the cautery.  The fascia and the peritoneum was also entered with the cautery, taking care not to injure the abdominal contents underneath.  The colon was visible first.  This appeared to be a loop of sigmoid colon.  His stomach was seen as well in the anterior abdomen.  This was the distal stomach.  The stomach was able to be pulled down significantly from his chest.  It was noted that the only tension that was present was from the omentum posterior to the stomach.  The lesser sac was opened and a significant amount of the adhesions from the stomach to the colon were taken down  to minimize the the upward traction on the stomach.  Once this had been done, an appropriate site for gastrostomy was selected.  A incision was made in the left upper quadrant at a good site for a G-tube.  A tonsil was used to pull the tube through the abdominal wall.  Two sutures were placed on the stomach and a gastrotomy was created with the cautery.  The tip of the tube was inserted into the stomach and the balloon inflated.  The pursestring sutures were tied down.  The stomach was then pexed to the abdominal wall with 5 sutures around the G-tube.  Some additional superior sutures were placed as well.  The G-tube was pulled up to be snug but not tight.  2-0 interrupted nylon sutures were used to secure the flange of the G-tube to the abdominal wall.  2-0 silks were used for all of these.  The omentum was pulled down partially.  The 2 sides of the fascia were elevated.  A piece of Seprafilm was placed under the abdominal wall.  The fascia was then closed using running #1 looped PDS suture x2.  The skin was irrigated and closed with skin staples.  The G-tube was flushed.  The balloon was inflated to 10 mL.  The abdomen was then cleaned, dried, and dressed with a soft sterile dressing.  The G-tube was hooked to gravity bag drainage.  The patient was allowed to emerge from anesthesia and taken to the PACU in stable condition.  Needle, sponge, and instrument counts  were correct x2.

## 2017-09-20 NOTE — Progress Notes (Addendum)
4 Days Post-Op    CC:  Nausea and vomiting  Subjective: Only discomfort is from the NG, no reports of flatus or BM awaiting Gastrostomy tube per IR.     Objective: Vital signs in last 24 hours: Temp:  [98.3 F (36.8 C)-99.3 F (37.4 C)] 98.3 F (36.8 C) (06/19 0607) Pulse Rate:  [70-78] 70 (06/19 0607) Resp:  [16-18] 16 (06/19 0607) BP: (148-187)/(91-97) 157/95 (06/19 0607) SpO2:  [94 %-96 %] 96 % (06/19 0607) Last BM Date: 09/16/17 2500 IV 1400 urine  550 per ng recorded Afebrile, VSS No labs - just ordered  Repeat CT yesterday: Large recurrent hiatal hernia involving much of the stomach as well as distal transverse colon and splenic flexure, without obstruction or strangulation. 2. Interval gastric decompression with nasogastric tube. 3. Small bowel loops interposed between the stomach and anterior abdominal wall, complicating approach for percutaneous gastrostomy. Consider surgical placement. 4. Progressive left pelvic and retroperitoneal adenopathy and sclerotic bone metastases since earlier study of 04/18/2017. 5. Descending and sigmoid diverticulosis. 6.  Aortic Atherosclerosis (ICD10-170.0)  Intake/Output from previous day: 06/18 0701 - 06/19 0700 In: 2568.3 [I.V.:2300; IV Piggyback:268.3] Out: 1950 [Urine:1400; Emesis/NG output:550] Intake/Output this shift: No intake/output data recorded.  General appearance: alert, cooperative and no distress Resp: clear to auscultation bilaterally GI: soft, nontender, few BS.  No flatus/BM  Lab Results:  No results for input(s): WBC, HGB, HCT, PLT in the last 72 hours.  BMET No results for input(s): NA, K, CL, CO2, GLUCOSE, BUN, CREATININE, CALCIUM in the last 72 hours. PT/INR No results for input(s): LABPROT, INR in the last 72 hours.  Recent Labs  Lab 09/16/17 1311  AST 35  ALT 16*  ALKPHOS 180*  BILITOT 0.8  PROT 7.8  ALBUMIN 4.3     Lipase     Component Value Date/Time   LIPASE 31 09/16/2017 1311      Medications: . chlorhexidine  15 mL Mouth Rinse BID  . mouth rinse  15 mL Mouth Rinse q12n4p    Assessment/Plan GERD/gastric ulcer hx Hypertension Hx of prostate ca with radiation Rx- Lupron RX -  Recurrent hiatal hernia with gastric outlet obstruction, 2nd portion of the duodenum Type IV hiatal hernia with volvulus - prior gastropexy reduced to an adhesion NG placement by endoscopy    FEN: NPO/NG in place/IV fluids ID: None DVT: SCDs Follow up: TBD  Plan:  Pt anatomy is not amenable to IR G tube placement, we plan to do the in the OR later today.           LOS: 4 days    Diquan Kassis 09/20/2017 [phone]

## 2017-09-20 NOTE — Progress Notes (Signed)
Patient ID: Jorge [name], male   DOB: 1929-07-25, 84 y.o.   MRN: [id number] Based on review of latest imaging studies by Dr. Pascal Lux patient is not a candidate for percutaneous gastrostomy tube placement.  Message sent to Dr. Barry Dienes.

## 2017-09-21 ENCOUNTER — Encounter (HOSPITAL_COMMUNITY): Payer: Self-pay | Admitting: General Surgery

## 2017-09-21 MED ORDER — TRAMADOL HCL 50 MG PO TABS
50.0000 mg | ORAL_TABLET | Freq: Four times a day (QID) | ORAL | Status: DC | PRN
  Administered 2017-09-22: 50 mg via ORAL
  Filled 2017-09-21: qty 1

## 2017-09-21 MED ORDER — LOSARTAN POTASSIUM 50 MG PO TABS
50.0000 mg | ORAL_TABLET | Freq: Every day | ORAL | Status: DC
  Administered 2017-09-21 – 2017-09-23 (×3): 50 mg via ORAL
  Filled 2017-09-21 (×3): qty 1

## 2017-09-21 MED ORDER — HEPARIN SODIUM (PORCINE) 5000 UNIT/ML IJ SOLN
5000.0000 [IU] | Freq: Three times a day (TID) | INTRAMUSCULAR | Status: DC
  Administered 2017-09-21 – 2017-09-23 (×6): 5000 [IU] via SUBCUTANEOUS
  Filled 2017-09-21 (×6): qty 1

## 2017-09-21 MED ORDER — ALUM & MAG HYDROXIDE-SIMETH 200-200-20 MG/5ML PO SUSP
30.0000 mL | ORAL | Status: DC | PRN
  Administered 2017-09-21: 30 mL via ORAL
  Filled 2017-09-21: qty 30

## 2017-09-21 MED ORDER — ACETAMINOPHEN 500 MG PO TABS
1000.0000 mg | ORAL_TABLET | Freq: Three times a day (TID) | ORAL | Status: DC
  Administered 2017-09-21 – 2017-09-22 (×3): 1000 mg via ORAL
  Administered 2017-09-22: 500 mg via ORAL
  Administered 2017-09-23: 1000 mg via ORAL
  Filled 2017-09-21 (×5): qty 2

## 2017-09-21 MED ORDER — MORPHINE SULFATE (PF) 2 MG/ML IV SOLN
1.0000 mg | INTRAVENOUS | Status: DC | PRN
  Administered 2017-09-21 – 2017-09-22 (×2): 2 mg via INTRAVENOUS
  Filled 2017-09-21 (×2): qty 1

## 2017-09-21 NOTE — Plan of Care (Signed)
Patient without complaint 7 a to 7 p shift, tolerating clear liquids without complaint of nausea or pain.  Patient up in chair entire shift.  Able to urinate after foley removed without difficulty.  Patient ambulatory in hallway.

## 2017-09-21 NOTE — Anesthesia Postprocedure Evaluation (Signed)
Anesthesia Post Note  Patient: Jorge [name]  Procedure(s) Performed: INSERTION OF GASTROSTOMY TUBE (N/A Abdomen)     Patient location during evaluation: PACU Anesthesia Type: General Level of consciousness: awake and alert Pain management: pain level controlled Vital Signs Assessment: post-procedure vital signs reviewed and stable Respiratory status: spontaneous breathing, nonlabored ventilation, respiratory function stable and patient connected to nasal cannula oxygen Cardiovascular status: blood pressure returned to baseline and stable Postop Assessment: no apparent nausea or vomiting Anesthetic complications: no    Last Vitals:  Vitals:   09/21/17 0548 09/21/17 0928  BP: (!) 152/88 123/71  Pulse: 70 74  Resp: 18 16  Temp: 36.6 C 37.7 C  SpO2: 94% 95%    Last Pain:  Vitals:   09/21/17 0928  TempSrc: Oral  PainSc:                  Martina Brodbeck

## 2017-09-21 NOTE — Progress Notes (Addendum)
1 Day Post-Op    CC:  nausea and vomiting  Subjective: He is doing well this AM.  Having some issue with incontinence.  He had some pain post op but after a shot and sleeping he has been pretty comfortable with just IV Tylenol. Few hypoactive BS, NoBM yesterday.  I left the dressing in place, almost nothing in the gastrostomy bag.    Objective: Vital signs in last 24 hours: Temp:  [97.3 F (36.3 C)-98.9 F (37.2 C)] 97.9 F (36.6 C) (06/20 0548) Pulse Rate:  [70-100] 70 (06/20 0548) Resp:  [14-24] 18 (06/20 0548) BP: (150-201)/(82-103) 152/88 (06/20 0548) SpO2:  [92 %-98 %] 94 % (06/20 0548) FiO2 (%):  [2 %] 2 % (06/20 0200) Weight:  [96 kg (211 lb 10.3 oz)] 96 kg (211 lb 10.3 oz) (06/20 0548) Last BM Date: 09/16/17 IV 3228 Urine 2750 Drain 35 recorded Afebrile, BP up some No labs today    Intake/Output from previous day: 06/19 0701 - 06/20 0700 In: 3288.3 [I.V.:2886.7; IV Piggyback:401.7] Out: 2935 [Urine:2750; Drains:35; Blood:50] Intake/Output this shift: No intake/output data recorded.  General appearance: alert, cooperative and no distress Resp: clear to auscultation bilaterally Cardio: regular rate and rhythm, S1, S2 normal, no murmur, click, rub or gallop GI: soft, a bit sore, minimal drainage from the gastrostomy tube.  Some flatus.  Lab Results:  Recent Labs    09/20/17 0854  WBC 7.7  HGB 14.0  HCT 41.8  PLT 213    BMET Recent Labs    09/20/17 0854  NA 138  K 4.2  CL 107  CO2 26  GLUCOSE 126*  BUN 11  CREATININE 0.82  CALCIUM 8.9   PT/INR Recent Labs    09/20/17 0854  LABPROT 13.0  INR 0.99    Recent Labs  Lab 09/16/17 1311  AST 35  ALT 16*  ALKPHOS 180*  BILITOT 0.8  PROT 7.8  ALBUMIN 4.3     Lipase     Component Value Date/Time   LIPASE 31 09/16/2017 1311     Medications: . chlorhexidine  15 mL Mouth Rinse BID  . mouth rinse  15 mL Mouth Rinse q12n4p   . acetaminophen Stopped (09/21/17 0559)  . dextrose 5 % and  0.45 % NaCl with KCl 20 mEq/L 100 mL/hr at 09/20/17 2302  . famotidine (PEPCID) IV Stopped (09/20/17 2138)   Antibiotics Given (last 72 hours)    Date/Time Action Medication Dose   09/20/17 1345 Given   ciprofloxacin (CIPRO) IVPB 400 mg 400 mg      Assessment/Plan GERD/gastric ulcer hx Hypertension Hx of prostate ca with radiation Rx- Lupron RX -  Recurrent hiatal hernia with gastric outlet obstruction, 2nd portion of the duodenum Type IV hiatal hernia with volvulus - prior gastropexy reduced to an adhesion NG placement by endoscopy, 09/16/17, Dr. Carol Ada Open gastrostomy tube, 50 Fr Moss, 09/20/17, Dr. Stark Klein    FEN: NPO/NG in place/IV fluids ID: pre op Cipro DVT: SCDs =>> restart heparin later today for DVT prophylaxis. Follow up: Dr. Johnathan Hausen  Plan:  Clamp Gastrostomy tube, start on clears, I told him to go slow, start to mobilize, restart PO hypertension medicines.  Work on IS. Get OT/PT evaluations, he lives alone. Set up home health, case manager and decrease IV fluids.  If he does well with clears advance him to fulls later today or in the AM.          LOS: 5 days    Jaquel Coomer  09/21/2017 [phone]  

## 2017-09-21 NOTE — Care Management Note (Signed)
Case Management Note  Patient Details  Name: Jorge [name] MRN: [id number] Date of Birth: 1929/06/29  Subjective/Objective: Spoke to patient about d/c plans-patient says he will have plenty friends to support him @ home, he walks independently-he doesn't feel he will need any HHC. Noted ordered for HHRN-patient pleasantly declines HHRN. Will continue to monitor. PT cons-await recc.                   Action/Plan:d/c plan home.   Expected Discharge Date:                  Expected Discharge Plan:  Home/Self Care  In-House Referral:     Discharge planning Services  CM Consult  Post Acute Care Choice:    Choice offered to:  Patient  DME Arranged:    DME Agency:     HH Arranged:    [place] Agency:     Status of Service:  In process, will continue to follow  If discussed at Long Length of Stay Meetings, dates discussed:    Additional Comments:  Dessa Phi, RN 09/21/2017, 1:19 PM

## 2017-09-22 LAB — CBC
HEMATOCRIT: 40.2 % (ref 39.0–52.0)
Hemoglobin: 13.1 g/dL (ref 13.0–17.0)
MCH: 32 pg (ref 26.0–34.0)
MCHC: 32.6 g/dL (ref 30.0–36.0)
MCV: 98 fL (ref 78.0–100.0)
Platelets: 251 10*3/uL (ref 150–400)
RBC: 4.1 MIL/uL — ABNORMAL LOW (ref 4.22–5.81)
RDW: 13.3 % (ref 11.5–15.5)
WBC: 12.1 10*3/uL — ABNORMAL HIGH (ref 4.0–10.5)

## 2017-09-22 LAB — BASIC METABOLIC PANEL
Anion gap: 6 (ref 5–15)
BUN: 14 mg/dL (ref 6–20)
CALCIUM: 9.1 mg/dL (ref 8.9–10.3)
CO2: 25 mmol/L (ref 22–32)
CREATININE: 0.88 mg/dL (ref 0.61–1.24)
Chloride: 108 mmol/L (ref 101–111)
GFR calc Af Amer: >60 mL/min (ref >60)
GFR calc non Af Amer: >60 mL/min (ref >60)
GLUCOSE: 111 mg/dL — AB (ref 65–99)
Potassium: 4.5 mmol/L (ref 3.5–5.1)
Sodium: 139 mmol/L (ref 135–145)

## 2017-09-22 MED ORDER — FAMOTIDINE 20 MG PO TABS
20.0000 mg | ORAL_TABLET | Freq: Two times a day (BID) | ORAL | Status: DC
Start: 2017-09-22 — End: 2017-09-23
  Administered 2017-09-22 – 2017-09-23 (×2): 20 mg via ORAL
  Filled 2017-09-22 (×2): qty 1

## 2017-09-22 MED ORDER — POLYETHYLENE GLYCOL 3350 17 G PO PACK
17.0000 g | PACK | ORAL | Status: DC
  Administered 2017-09-22: 17 g via ORAL
  Filled 2017-09-22: qty 1

## 2017-09-22 MED ORDER — SENNA 8.6 MG PO TABS
1.0000 | ORAL_TABLET | Freq: Two times a day (BID) | ORAL | Status: DC
  Administered 2017-09-22 – 2017-09-23 (×3): 8.6 mg via ORAL
  Filled 2017-09-22 (×3): qty 1

## 2017-09-22 NOTE — Evaluation (Signed)
Physical Therapy Evaluation Patient Details Name: Jorge [name] MRN: [id number] DOB: [birth year] Today's Date: 09/22/2017   History of Present Illness  Pt was admitted for large hiatal hernia and gastric outlet obstruction.  S/P open g-tube placement.  H/O prostate CA  Clinical Impression  Pt admitted with above diagnosis. Pt currently with functional limitations due to the deficits listed below (see PT Problem List).  Pt is active and independent at his baseline; presents with higher level balance deficits and would benefit from OPPT, may also benefit from use of cane to improve safety and  gait stability, decreasing risk of falls;  (pt scored 42 on the BERG indicating significant fall risk and that pt would likely benefit from use of cane) will continue to follow for higher level balance work;    Pt will benefit from skilled PT to increase their independence and safety with mobility to allow discharge to the venue listed below.       Follow Up Recommendations No PT follow up;Outpatient PT(pending progress)    Equipment Recommendations  Other (comment)(TBD--cane?)    Recommendations for Other Services       Precautions / Restrictions Precautions Precautions: Fall Precaution Comments: wears depends at home due to incontinence Restrictions Weight Bearing Restrictions: No      Mobility  Bed Mobility               General bed mobility comments: pt received in chair  Transfers Overall transfer level: Needs assistance Equipment used: None Transfers: Sit to/from Stand Sit to Stand: Supervision         General transfer comment: for safety  Ambulation/Gait Ambulation/Gait assistance: Supervision Gait Distance (Feet): 40 Feet Assistive device: None Gait Pattern/deviations: Step-through pattern;Decreased stride length;Wide base of support     General Gait Details: mild gait instability, hesistant but no overt LOB; stability improved with distance and time (pt feels  he is a little "wobbly" d/t lack of real food for many days)  Stairs            Wheelchair Mobility    Modified Rankin (Stroke Patients Only)       Balance Overall balance assessment: Needs assistance   Sitting balance-Leahy Scale: Good     Standing balance support: During functional activity;No upper extremity supported Standing balance-Leahy Scale: Good               High level balance activites: Head turns;Turns;Direction changes;Side stepping;Backward walking High Level Balance Comments: no LOB with above, slower gait velocity while maneuvering in tighter spaces of room and bathroom Standardized Balance Assessment Standardized Balance Assessment : Berg Balance Test Berg Balance Test Sit to Stand: Able to stand  independently using hands Standing Unsupported: Able to stand safely 2 minutes Sitting with Back Unsupported but Feet Supported on Floor or Stool: Able to sit safely and securely 2 minutes Stand to Sit: Controls descent by using hands Transfers: Able to transfer safely, definite need of hands Standing Unsupported with Eyes Closed: Able to stand 10 seconds safely Standing Ubsupported with Feet Together: Able to place feet together independently and stand for 1 minute with supervision From Standing, Reach Forward with Outstretched Arm: Can reach forward >12 cm safely (5") From Standing Position, Pick up Object from Floor: Able to pick up shoe, needs supervision From Standing Position, Turn to Look Behind Over each Shoulder: Looks behind one side only/other side shows less weight shift Turn 360 Degrees: Able to turn 360 degrees safely but slowly Standing Unsupported, Alternately Place Feet on  Step/Stool: Able to complete >2 steps/needs minimal assist Standing Unsupported, One Foot in Front: Able to plae foot ahead of the other independently and hold 30 seconds Standing on One Leg: Able to lift leg independently and hold 5-10 seconds Total Score: 42          Pertinent Vitals/Pain Pain Assessment: Faces Faces Pain Scale: Hurts a little bit Pain Location: abdomen Pain Descriptors / Indicators: Sore Pain Intervention(s): Monitored during session;Limited activity within patient's tolerance    Home Living Family/patient expects to be discharged to:: Private residence Living Arrangements: Alone Available Help at Discharge: Family;Friend(s) Type of Home: House         Home Equipment: None      Prior Function Level of Independence: Independent               Hand Dominance        Extremity/Trunk Assessment   Upper Extremity Assessment Upper Extremity Assessment: Overall WFL for tasks assessed;Defer to OT evaluation    Lower Extremity Assessment Lower Extremity Assessment: Overall WFL for tasks assessed       Communication   Communication: No difficulties  Cognition Arousal/Alertness: Awake/alert Behavior During Therapy: WFL for tasks assessed/performed Overall Cognitive Status: Within Functional Limits for tasks assessed                                        General Comments General comments (skin integrity, edema, etc.): pt scored 42 on BERG indicating significant fall risk and that pt would likely benefit from use of cane    Exercises     Assessment/Plan    PT Assessment Patient needs continued PT services  PT Problem List Decreased mobility;Decreased knowledge of use of DME;Decreased balance       PT Treatment Interventions DME instruction;Gait training;Functional mobility training;Therapeutic exercise;Patient/family education;Therapeutic activities;Balance training    PT Goals (Current goals can be found in the Care Plan section)  Acute Rehab PT Goals Patient Stated Goal: home, less pain PT Goal Formulation: With patient Time For Goal Achievement: 09/29/17 Potential to Achieve Goals: Good    Frequency Min 3X/week   Barriers to discharge        Co-evaluation                AM-PAC PT "6 Clicks" Daily Activity  Outcome Measure Difficulty turning over in bed (including adjusting bedclothes, sheets and blankets)?: A Little Difficulty moving from lying on back to sitting on the side of the bed? : A Little Difficulty sitting down on and standing up from a chair with arms (e.g., wheelchair, bedside commode, etc,.)?: A Little Help needed moving to and from a bed to chair (including a wheelchair)?: None Help needed walking in hospital room?: A Little Help needed climbing 3-5 steps with a railing? : A Little 6 Click Score: 19    End of Session   Activity Tolerance: Patient tolerated treatment well Patient left: in chair;with call bell/phone within reach   PT Visit Diagnosis: Unsteadiness on feet (R26.81)    Time: 2831-5176 PT Time Calculation (min) (ACUTE ONLY): 24 min   Charges:   PT Evaluation $PT Eval Low Complexity: 1 Low PT Treatments $Gait Training: 8-22 mins   PT G CodesKenyon Ana, PT Pager: [phone] 09/22/2017   Kenyon Ana 09/22/2017, 3:06 PM

## 2017-09-22 NOTE — Progress Notes (Signed)
PHARMACIST - PHYSICIAN COMMUNICATION  DR:   General surgery  CONCERNING: IV to Oral Route Change Policy  RECOMMENDATION: This patient is receiving famotidine by the intravenous route.  Based on criteria approved by the Pharmacy and Therapeutics Committee, the intravenous medication(s) is/are being converted to the equivalent oral dose form(s).   DESCRIPTION: These criteria include:  The patient is eating (either orally or via tube) and/or has been taking other orally administered medications for a least 24 hours  The patient has no evidence of active gastrointestinal bleeding or impaired GI absorption (gastrectomy, short bowel, patient on TNA or NPO).  If you have questions about this conversion, please contact the Pharmacy Department  []   [phone] )  [facility] []   [phone] )  [facility] []   [phone] )  [facility] []   [phone] )  [facility] [x]   [phone] )  [place], PharmD, BCPS.   09/22/2017 9:59 AM

## 2017-09-22 NOTE — Evaluation (Signed)
Occupational Therapy Evaluation Patient Details Name: Jorge [name] MRN: [id number] DOB: [birth year] Today's Date: 09/22/2017    History of Present Illness Pt was admitted for large hiatal hernia and gastric outlet obstruction.  S/P open g-tube placement.  H/O prostate CA   Clinical Impression   This 84 year old man was admitted for the above.  He lives alone and is independent at baseline. Pt states 2 sister in laws and a friend can stay 24/7.  He has high commodes but no DME. Will follow in acute to further assess need for 3:1 and to continue to promote independence with adls. Goals are for supervision level in acute    Follow Up Recommendations  Home health OT(assist for bed mobility; initial 24/7)    Equipment Recommendations  (to be further assessed, ?3:1 has high commode)    Recommendations for Other Services       Precautions / Restrictions Precautions Precautions: Fall Precaution Comments: wears depends at home due to incontinence Restrictions Weight Bearing Restrictions: No      Mobility Bed Mobility Overal bed mobility: Needs Assistance Bed Mobility: Rolling;Sidelying to Sit;Sit to Sidelying Rolling: Min assist Sidelying to sit: Min assist       General bed mobility comments: educated on rolling for bed mobility; he has been getting OOB with HOB raised.  Raised approximately 30 when rolling  Transfers Overall transfer level: Needs assistance Equipment used: None Transfers: Sit to/from Stand Sit to Stand: Min guard         General transfer comment: for safety    Balance                                           ADL either performed or assessed with clinical judgement   ADL Overall ADL's : Needs assistance/impaired Eating/Feeding: Independent   Grooming: Supervision/safety;Wash/dry hands;Standing   Upper Body Bathing: Set up;Sitting   Lower Body Bathing: Min guard;Sit to/from stand;With adaptive equipment   Upper Body  Dressing : Set up;Sitting   Lower Body Dressing: Moderate assistance;Sit to/from stand;With adaptive equipment(reacher for pants)   Toilet Transfer: Min guard;Ambulation;RW(bed)   Toileting- Clothing Manipulation and Hygiene: Minimal assistance;Sit to/from stand         General ADL Comments: stood at toilet to urinate. Assisted with mesh underwear and pad.  Pt has a reacher; demonstrated donning pants with this and using for LB bathing. Pt cannot comfortably cross legs to reach feet.     Vision         Perception     Praxis      Pertinent Vitals/Pain Pain Assessment: Faces Faces Pain Scale: Hurts little more Pain Location: abdomen Pain Descriptors / Indicators: Sore Pain Intervention(s): Limited activity within patient's tolerance;Monitored during session;Premedicated before session;Repositioned     Hand Dominance     Extremity/Trunk Assessment Upper Extremity Assessment Upper Extremity Assessment: Overall WFL for tasks assessed           Communication Communication Communication: No difficulties   Cognition Arousal/Alertness: Awake/alert Behavior During Therapy: WFL for tasks assessed/performed Overall Cognitive Status: Impaired/Different from baseline                                     General Comments       Exercises     Shoulder Instructions  Home Living Family/patient expects to be discharged to:: Private residence Living Arrangements: Alone Available Help at Discharge: Family;Friend(s) Type of Home: House             Bathroom Shower/Tub: Walk-in Corporate treasurer Toilet: Handicapped height     Home Equipment: None   Additional Comments: no bars nor seat in shower      Prior Functioning/Environment Level of Independence: Independent                 OT Problem List: Decreased strength;Decreased activity tolerance;Decreased knowledge of use of DME or AE;Decreased knowledge of precautions;Pain      OT  Treatment/Interventions: Self-care/ADL training;Energy conservation;DME and/or AE instruction;Patient/family education    OT Goals(Current goals can be found in the care plan section) Acute Rehab OT Goals Patient Stated Goal: home, less pain OT Goal Formulation: With patient Time For Goal Achievement: 10/06/17 Potential to Achieve Goals: Good ADL Goals Pt Will Transfer to Toilet: with supervision;ambulating(high vs 3:1) Pt Will Perform Tub/Shower Transfer: Shower transfer;with supervision;ambulating(with vs without 3:1) Additional ADL Goal #1: pt will complete ADL with supervision using reacher (except for socks)  OT Frequency: Min 2X/week   Barriers to D/C:            Co-evaluation              AM-PAC PT "6 Clicks" Daily Activity     Outcome Measure Help from another person eating meals?: None Help from another person taking care of personal grooming?: A Little Help from another person toileting, which includes using toliet, bedpan, or urinal?: A Little Help from another person bathing (including washing, rinsing, drying)?: A Little Help from another person to put on and taking off regular upper body clothing?: A Little Help from another person to put on and taking off regular lower body clothing?: A Lot 6 Click Score: 18   End of Session Nurse Communication: (no chair alarm in room)  Activity Tolerance: Patient tolerated treatment well Patient left: in chair;with call bell/phone within reach  OT Visit Diagnosis: Muscle weakness (generalized) (M62.81)                Time: 6384-6659 OT Time Calculation (min): 38 min Charges:  OT General Charges $OT Visit: 1 Visit OT Evaluation $OT Eval Low Complexity: 1 Low OT Treatments $Self Care/Home Management : 8-22 mins G-Codes:     [place], OTR/L [phone] 09/22/2017  Jorge [name] 09/22/2017, 10:33 AM

## 2017-09-22 NOTE — Progress Notes (Signed)
[facility] Progress Note  2 Days Post-Op  Subjective: CC:  Abdominal soreness around incision. Tolerating PO without N/V. Endorses belching. Denies bowel function. Not mobilizing because when he gets up he experiences urinary incontinence (issue prior to admission, wears depends at baseline).  Objective: Vital signs in last 24 hours: Temp:  [98.1 F (36.7 C)-99 F (37.2 C)] 98.1 F (36.7 C) (06/20 2039) Pulse Rate:  [69-84] 84 (06/20 2039) Resp:  [18-20] 18 (06/20 2039) BP: (138-168)/(81-89) 152/89 (06/20 2039) SpO2:  [94 %-96 %] 94 % (06/20 2039) Last BM Date: (PTA )  Intake/Output from previous day: 06/20 0701 - 06/21 0700 In: 2535.8 [P.O.:710; I.V.:1647.5; IV Piggyback:103.3] Out: -  Intake/Output this shift: Total I/O In: 25 [Other:25] Out: -   PE: Gen:  Alert, NAD, pleasant Pulm:  Normal effort, clear to auscultation bilaterally Abd: Soft, appropriately tender around incisions, mild distention, +BS  Lab Results:  Recent Labs    09/20/17 0854 09/22/17 0506  WBC 7.7 12.1*  HGB 14.0 13.1  HCT 41.8 40.2  PLT 213 251   BMET Recent Labs    09/20/17 0854 09/22/17 0506  NA 138 139  K 4.2 4.5  CL 107 108  CO2 26 25  GLUCOSE 126* 111*  BUN 11 14  CREATININE 0.82 0.88  CALCIUM 8.9 9.1   PT/INR Recent Labs    09/20/17 0854  LABPROT 13.0  INR 0.99   CMP     Component Value Date/Time   NA 139 09/22/2017 0506   NA 140 12/20/2016 1307   K 4.5 09/22/2017 0506   K 4.5 12/20/2016 1307   CL 108 09/22/2017 0506   CO2 25 09/22/2017 0506   CO2 25 12/20/2016 1307   GLUCOSE 111 (H) 09/22/2017 0506   GLUCOSE 109 12/20/2016 1307   BUN 14 09/22/2017 0506   BUN 13.3 12/20/2016 1307   CREATININE 0.88 09/22/2017 0506   CREATININE 0.9 12/20/2016 1307   CALCIUM 9.1 09/22/2017 0506   CALCIUM 9.4 12/20/2016 1307   PROT 7.8 09/16/2017 1311   PROT 6.7 12/20/2016 1307   ALBUMIN 4.3 09/16/2017 1311   ALBUMIN 3.5 12/20/2016 1307   AST 35 09/16/2017  1311   AST 21 12/20/2016 1307   ALT 16 (L) 09/16/2017 1311   ALT 15 12/20/2016 1307   ALKPHOS 180 (H) 09/16/2017 1311   ALKPHOS 105 12/20/2016 1307   BILITOT 0.8 09/16/2017 1311   BILITOT 0.33 12/20/2016 1307   GFRNONAA >60 09/22/2017 0506   GFRAA >60 09/22/2017 0506   Lipase     Component Value Date/Time   LIPASE 31 09/16/2017 1311   Studies/Results: No results found.  Anti-infectives: Anti-infectives (From admission, onward)   Start     Dose/Rate Route Frequency Ordered Stop   09/20/17 1030  ciprofloxacin (CIPRO) IVPB 400 mg     400 mg 200 mL/hr over 60 Minutes Intravenous On call to O.R. 09/20/17 1019 09/20/17 1445   09/20/17 1000  ceFAZolin (ANCEF) IVPB 2g/100 mL premix  Status:  Discontinued     2 g 200 mL/hr over 30 Minutes Intravenous On call to O.R. 09/20/17 0953 09/20/17 1015     Assessment/Plan GERD/gastric ulcer hx Hypertension Hx of prostate ca with radiation Rx- Lupron RX -  Recurrent hiatal hernia with gastric outlet obstruction, 2nd portion of the duodenum Type IV hiatal hernia with volvulus - prior gastropexy reduced to an adhesion -  S/P endoscopy 09/16/17 Dr. Benson Norway - POD#2 S/P open gastrostomy tube, 22 Fr Moss, 09/20/17 Dr. Penne Lash  -  Tolerating clears, advance to fulls -  endorses belching and not having flatus/BMs, await further bowel function -  Issues with urinary incontinence limit pts movement but he needs to be encouraged to get OOB/ambulate  Leukocytosis- WBC 12 today from 7; HR normal, afebrile, repeat CBC in AM and follow.   FEN: Full liq ID: peri-operative cipro 6/19 VTE: SCD's, heparin  F/U: Dr. Hassell Done    LOS: 6 days    Jill Alexanders , [facility] 09/22/2017, 9:53 AM Pager: [phone] Consults: [phone] Mon-Fri 7:00 am-4:30 pm Sat-Sun 7:00 am-11:30 am

## 2017-09-22 NOTE — Progress Notes (Signed)
OT Cancellation Note  Patient Details Name: Jorge [name] MRN: [id number] DOB: 12-25-1929   Cancelled Treatment:    Reason Eval/Treat Not Completed: Pain limiting ability to participate. Will return after pt gets pain medication.  Nathanial Arrighi 09/22/2017, 8:06 AM  Lesle Chris, OTR/L [phone] 09/22/2017

## 2017-09-23 LAB — CBC
HEMATOCRIT: 37.8 % — AB (ref 39.0–52.0)
HEMOGLOBIN: 12.1 g/dL — AB (ref 13.0–17.0)
MCH: 31.5 pg (ref 26.0–34.0)
MCHC: 32 g/dL (ref 30.0–36.0)
MCV: 98.4 fL (ref 78.0–100.0)
PLATELETS: 222 10*3/uL (ref 150–400)
RBC: 3.84 MIL/uL — AB (ref 4.22–5.81)
RDW: 13.4 % (ref 11.5–15.5)
WBC: 8.1 10*3/uL (ref 4.0–10.5)

## 2017-09-23 MED ORDER — HYDROCORTISONE 1 % EX CREA: 1.0000 "application " | TOPICAL_CREAM | Freq: Three times a day (TID) | CUTANEOUS | Status: DC | PRN

## 2017-09-23 MED ORDER — MENTHOL 3 MG MT LOZG: 1.0000 | LOZENGE | OROMUCOSAL | Status: DC | PRN

## 2017-09-23 MED ORDER — ALUM & MAG HYDROXIDE-SIMETH 200-200-20 MG/5ML PO SUSP: 30.0000 mL | Freq: Four times a day (QID) | ORAL | Status: DC | PRN

## 2017-09-23 MED ORDER — TRAMADOL HCL 50 MG PO TABS: 50.0000 mg | ORAL_TABLET | Freq: Four times a day (QID) | ORAL | 0 refills | Status: DC | PRN

## 2017-09-23 MED ORDER — GUAIFENESIN-DM 100-10 MG/5ML PO SYRP: 10.0000 mL | ORAL_SOLUTION | ORAL | Status: DC | PRN

## 2017-09-23 MED ORDER — SODIUM CHLORIDE 0.9% FLUSH
3.0000 mL | Freq: Two times a day (BID) | INTRAVENOUS | Status: DC
  Administered 2017-09-23: 3 mL via INTRAVENOUS

## 2017-09-23 MED ORDER — PHENOL 1.4 % MT LIQD: 1.0000 | OROMUCOSAL | Status: DC | PRN

## 2017-09-23 MED ORDER — MAGIC MOUTHWASH
15.0000 mL | Freq: Four times a day (QID) | ORAL | Status: DC | PRN
  Filled 2017-09-23: qty 15

## 2017-09-23 MED ORDER — HYDROCORTISONE 2.5 % RE CREA: 1.0000 "application " | TOPICAL_CREAM | Freq: Four times a day (QID) | RECTAL | Status: DC | PRN

## 2017-09-23 MED ORDER — LIP MEDEX EX OINT
1.0000 "application " | TOPICAL_OINTMENT | Freq: Two times a day (BID) | CUTANEOUS | Status: DC
  Administered 2017-09-23: 1 via TOPICAL

## 2017-09-23 MED ORDER — LACTATED RINGERS IV BOLUS: 1000.0000 mL | Freq: Three times a day (TID) | INTRAVENOUS | Status: DC | PRN

## 2017-09-23 MED ORDER — SODIUM CHLORIDE 0.9% FLUSH: 3.0000 mL | INTRAVENOUS | Status: DC | PRN

## 2017-09-23 MED ORDER — POLYETHYLENE GLYCOL 3350 17 G PO PACK
17.0000 g | PACK | Freq: Every day | ORAL | Status: DC
  Administered 2017-09-23: 17 g via ORAL
  Filled 2017-09-23: qty 1

## 2017-09-23 MED ORDER — SODIUM CHLORIDE 0.9 % IV SOLN: 250.0000 mL | INTRAVENOUS | Status: DC | PRN

## 2017-09-23 NOTE — Discharge Instructions (Signed)
Gastrostomy Tube Home Guide, Adult A gastrostomy tube is a tube that is surgically placed into the stomach. It is also called a G-tube. G-tubes are used when a person is unable to eat and drink enough on their own to stay healthy. The tube is inserted into the stomach through a small cut (incision) in the skin. This tube is used for:  Feeding.  Giving medication.  Gastrostomy tube care  Wash your hands with soap and water.  Remove the old dressing (if any). Some styles of G-tubes may need a dressing inserted between the skin and the G-tube. Other types of G-tubes do not require a dressing. Ask your health care provider if a dressing is needed.  Check the area where the tube enters the skin (insertion site) for redness, swelling, or pus-like (purulent) drainage. A small amount of clear or tan liquid drainage is normal. Check to make sure scar tissue (skin) is not growing around the insertion site. This could have a raised, bumpy appearance.  A cotton swab can be used to clean the skin around the tube: ? When the G-tube is first put in, a normal saline solution or water can be used to clean the skin. ? Mild soap and warm water can be used when the skin around the G-tube site has healed. ? Roll the cotton swab around the G-tube insertion site to remove any drainage or crusting at the insertion site. Stomach residuals Feeding tube residuals are the amount of liquids that are in the stomach at any given time. Residuals may be checked before giving feedings, medications, or as instructed by your health care provider.  Ask your health care provider if there are instances when you would not start tube feedings depending on the amount or type of contents withdrawn from the stomach.  Check residuals by attaching a syringe to the G-tube and pulling back on the syringe plunger. Note the amount, and return the residual back into the stomach.  Flushing the G-tube  The G-tube should be periodically  flushed with clean warm water to keep it from clogging. ? Flush the G-tube after feedings or medications. Draw up 30 mL of warm water in a syringe. Connect the syringe to the G-tube and slowly push the water into the tube. ? Do not push feedings, medications, or flushes rapidly. Flush the G-tube gently and slowly. ? Only use syringes made for G-tubes to flush medications or feedings. ? Your health care provider may want the G-tube flushed more often or with more water. If this is the case, follow your health care provider's instructions. Feedings Your health care provider will determine whether feedings are given as a bolus (a certain amount given at one time and at scheduled times) or whether feedings will be given continuously on a feeding pump.  Formulas should be given at room temperature.  If feedings are continuous, no more than 4 hours worth of feedings should be placed in the feeding bag. This helps prevent spoilage or accidental excess infusion.  Cover and place unused formula in the refrigerator.  If feedings are continuous, stop the feedings when medications or flushes are given. Be sure to restart the feedings.  Feeding bags and syringes should be replaced as instructed by your health care provider.  Giving medication  In general, it is best if all medications are in a liquid form for G-tube administration. Liquid medications are less likely to clog the G-tube. ? Mix the liquid medication with 30 mL (or amount recommended  by your health care provider) of warm water. ? Draw up the medication into the syringe. ? Attach the syringe to the G-tube and slowly push the mixture into the G-tube. ? After giving the medication, draw up 30 mL of warm water in the syringe and slowly flush the G-tube.  For pills or capsules, check with your health care provider first before crushing medications. Some pills are not effective if they are crushed. Some capsules are sustained-release  medications. ? If appropriate, crush the pill or capsule and mix with 30 mL of warm water. Using the syringe, slowly push the medication through the tube, then flush the tube with another 30 mL of tap water. G-tube problems G-tube was pulled out.  Cause: May have been pulled out accidentally.  Solutions: Cover the opening with clean dressing and tape. Call your health care provider right away. The G-tube should be put in as soon as possible (within 4 hours) so the G-tube opening (tract) does not close. The G-tube needs to be put in at a health care setting. An X-ray needs to be done to confirm placement before the G-tube can be used again.  Redness, irritation, soreness, or foul odor around the gastrostomy site.  Cause: May be caused by leakage or infection.  Solutions: Call your health care provider right away.  Large amount of leakage of fluid or mucus-like liquid present (a large amount means it soaks clothing).  Cause: Many reasons could cause the G-tube to leak.  Solutions: Call your health care provider to discuss the amount of leakage.  Skin or scar tissue appears to be growing where tube enters skin.  Cause: Tissue growth may develop around the insertion site if the G-tube is moved or pulled on excessively.  Solutions: Secure tube with tape so that excess movement does not occur. Call your health care provider.  G-tube is clogged.  Cause: Thick formula or medication.  Solutions: Try to slowly push warm water into the tube with a large syringe. Never try to push any object into the tube to unclog it. Do not force fluid into the G-tube. If you are unable to unclog the tube, call your health care provider right away.  Tips  Head of bed (HOB) position refers to the upright position of a person's upper body. ? When giving medications or a feeding bolus, keep the [facility] up as told by your health care provider. Do this during the feeding and for 1 hour after the feeding or  medication administration. ? If continuous feedings are being given, it is best to keep the [facility] up as told by your health care provider. When ADLs (activities of daily living) are performed and the [facility] needs to be flat, be sure to turn the feeding pump off. Restart the feeding pump when the [facility] is returned to the recommended height.  Do not pull or put tension on the tube.  To prevent fluid backflow, kink the G-tube before removing the cap or disconnecting a syringe.  Check the G-tube length every day. Measure from the insertion site to the end of the G-tube. If the length is longer than previous measurements, the tube may be coming out. Call your health care provider if you notice increasing G-tube length.  Oral care, such as brushing teeth, must be continued.  You may need to remove excess air (vent) from the G-tube. Your health care provider will tell you if this is needed.  Always call your health care provider if you have  questions or problems with the G-tube. Get help right away if:  You have severe abdominal pain, tenderness, or abdominal bloating (distension).  You have nausea or vomiting.  You are constipated or have problems moving your bowels.  The G-tube insertion site is red, swollen, has a foul smell, or has yellow or brown drainage.  You have difficulty breathing or shortness of breath.  You have a fever.  You have a large amount of feeding tube residuals.  The G-tube is clogged and cannot be flushed. This information is not intended to replace advice given to you by your health care provider. Make sure you discuss any questions you have with your health care provider. Document Released: 05/30/2001 Document Revised: 08/27/2015 Document Reviewed: 11/26/2012 Elsevier Interactive Patient Education  2017 [place]: POST OP INSTRUCTIONS  ######################################################################  EAT Gradually transition to a  high fiber diet with a fiber supplement over the next few weeks after discharge.  Start with a pureed / full liquid diet (see below)  WALK Walk an hour a day.  Control your pain to do that.    CONTROL PAIN Control pain so that you can walk, sleep, tolerate sneezing/coughing, go up/down stairs.  HAVE A BOWEL MOVEMENT DAILY Keep your bowels regular to avoid problems.  OK to try a laxative to override constipation.  OK to use an antidairrheal to slow down diarrhea.  Call if not better after 2 tries  CALL IF YOU HAVE PROBLEMS/CONCERNS Call if you are still struggling despite following these instructions. Call if you have concerns not answered by these instructions  ######################################################################    1. DIET: Follow a light bland diet the first 24 hours after arrival home, such as soup, liquids, crackers, etc.  Be sure to include lots of fluids daily.  Avoid fast food or heavy meals as your are more likely to get nauseated.  Eat a low fat the next few days after surgery.   2. Take your usually prescribed home medications unless otherwise directed. 3. PAIN CONTROL: a. Pain is best controlled by a usual combination of three different methods TOGETHER: i. Ice/Heat ii. Over the counter pain medication iii. Prescription pain medication b. Most patients will experience some swelling and bruising around the incisions.  Ice packs or heating pads (30-60 minutes up to 6 times a day) will help. Use ice for the first few days to help decrease swelling and bruising, then switch to heat to help relax tight/sore spots and speed recovery.  Some people prefer to use ice alone, heat alone, alternating between ice & heat.  Experiment to what works for you.  Swelling and bruising can take several weeks to resolve.   c. It is helpful to take an over-the-counter pain medication regularly for the first few weeks.  Choose one of the following that works best for you: i. Naproxen  (Aleve, etc)  Two 220mg  tabs twice a day ii. Ibuprofen (Advil, etc) Three 200mg  tabs four times a day (every meal & bedtime) iii. Acetaminophen (Tylenol, etc) 500-650mg  four times a day (every meal & bedtime) d. A  prescription for pain medication (such as oxycodone, hydrocodone, etc) should be given to you upon discharge.  Take your pain medication as prescribed.  i. If you are having problems/concerns with the prescription medicine (does not control pain, nausea, vomiting, rash, itching, etc), please call us [phone] to see if we need to switch you to a different pain medicine that will work better for you and/or control  your side effect better. ii. If you need a refill on your pain medication, please contact your pharmacy.  They will contact our office to request authorization. Prescriptions will not be filled after 5 pm or on week-ends. 4. Avoid getting constipated.  Between the surgery and the pain medications, it is common to experience some constipation.  Increasing fluid intake and taking a fiber supplement (such as Metamucil, Citrucel, FiberCon, MiraLax, etc) 1-2 times a day regularly will usually help prevent this problem from occurring.  A mild laxative (prune juice, Milk of Magnesia, MiraLax, etc) should be taken according to package directions if there are no bowel movements after 48 hours.   5. Watch out for diarrhea.  If you have many loose bowel movements, simplify your diet to bland foods & liquids for a few days.  Stop any stool softeners and decrease your fiber supplement.  Switching to mild anti-diarrheal medications (Kayopectate, Pepto Bismol) can help.  If this worsens or does not improve, please call us. 6. Wash / shower every day.  You may shower over the dressings as they are waterproof.  Continue to shower over incision(s) after the dressing is off. 7. Remove your waterproof bandages 5 days after surgery.  You may leave the incision open to air.  You may replace a  dressing/Band-Aid to cover the incision for comfort if you wish.  8. ACTIVITIES as tolerated:   a. You may resume regular (light) daily activities beginning the next day--such as daily self-care, walking, climbing stairs--gradually increasing activities as tolerated.  If you can walk 30 minutes without difficulty, it is safe to try more intense activity such as jogging, treadmill, bicycling, low-impact aerobics, swimming, etc. b. Save the most intensive and strenuous activity for last such as sit-ups, heavy lifting, contact sports, etc  Refrain from any heavy lifting or straining until you are off narcotics for pain control.   c. DO NOT PUSH THROUGH PAIN.  Let pain be your guide: If it hurts to do something, don't do it.  Pain is your body warning you to avoid that activity for another week until the pain goes down. d. You may drive when you are no longer taking prescription pain medication, you can comfortably wear a seatbelt, and you can safely maneuver your car and apply brakes. e. Dennis Bast may have sexual intercourse when it is comfortable.  9. FOLLOW UP in our office a. Please call [facility] at [phone] to set up an appointment to see your surgeon in the office for a follow-up appointment approximately 2-3 weeks after your surgery. b. Make sure that you call for this appointment the day you arrive home to insure a convenient appointment time. 10. IF YOU HAVE DISABILITY OR FAMILY LEAVE FORMS, BRING THEM TO THE OFFICE FOR PROCESSING.  DO NOT GIVE THEM TO YOUR DOCTOR.   WHEN TO CALL us [phone]: 1. Poor pain control 2. Reactions / problems with new medications (rash/itching, nausea, etc)  3. Fever over 101.5 F (38.5 C) 4. Inability to urinate 5. Nausea and/or vomiting 6. Worsening swelling or bruising 7. Continued bleeding from incision. 8. Increased pain, redness, or drainage from the incision   The clinic staff is available to answer your questions during regular business hours  (8:30am-5pm).  Please dont hesitate to call and ask to speak to one of our nurses for clinical concerns.   If you have a medical emergency, go to the nearest emergency room or call 911.  A surgeon from [facility]  is always on call at the [facility], [place], [place], [place], [place]  [address] ? MAIN: [phone] ? TOLL FREE: [phone] ?  FAX [phone] [email or web address]

## 2017-09-23 NOTE — Progress Notes (Signed)
DIONDRE PULIS [id number] 05-Jan-1930  CARE TEAM:  PCP: Cassandria Anger, MD  Outpatient Care Team: Patient Care Team: Cassandria Anger, MD as PCP - General Dahlstedt, Annie Main, MD (Urology) Gatha Mayer, MD (Gastroenterology) Johnathan Hausen, MD as Consulting Physician (General Surgery) Grace Isaac, MD as Consulting Physician (Cardiothoracic Surgery)  Inpatient Treatment Team: Treatment Team: Attending Provider: Nolon Nations, MD; Attending Physician: Edison Pace Md, MD; Registered Nurse: Drucilla Chalet, RN; Technician: Renea Ee, NT; Technician: Vira Blanco, NT; Technician: Heber [place], NT; Technician: Kizzie Furnish, NT; Registered Nurse: Tacey Ruiz, RN; Registered Nurse: Ophelia Shoulder, RN; Registered Nurse: Hilton Cork, Bertram Gala, RN; Registered Nurse: Helayne Seminole, RN; Registered Nurse: [place] Cullens, RN   Problem List:   Principal Problem:   Large hiatal hernia Active Problems:   Essential hypertension   GERD   Prostate cancer [facility])   Small bowel obstruction ([facility])   3 Days Post-Op  09/20/2017  Procedure(s): EX LAP INSERTION OF GASTROSTOMY TUBE  [address]    Dr. Barry Dienes    Assessment  Recurrent hiatal hernia with gastric outlet obstruction, 2nd portion of the duodenum Type IV hiatal hernia with volvulus - prior gastropexy reduced to an adhesion  Recovering well so far  Hospital Stay = 7 days   Plan:   -  S/P endoscopy 09/16/17 Dr. Benson Norway - POD#2 S/P open gastrostomy tube, -  Tolerating clears, advance to fulls -  endorses belching and not having flatus/BMs, await further bowel function -  Issues with urinary incontinence limit pts movement but he needs to be encouraged to get   OOB/ambulate.  mobilize as tolerated to help recovery  Leukocytosis- WBC improved.   FEN: adv to Derma solid diet ID: peri-operative cipro 6/19 VTE: SCD's, heparin  F/U: Dr. Hassell Done   20 minutes spent in review, evaluation,  examination, counseling, and coordination of care.  More than 50% of that time was spent in counseling.  Adin Hector, MD, FACS, MASCRS Gastrointestinal and Minimally Invasive Surgery    [address], [place], [place] [address] [phone] Main / Paging [phone] Fax   09/23/2017    Subjective: (Chief complaint)  Feels good Tol soft diet  Objective:  Vital signs:  Vitals:   09/22/17 1403 09/22/17 1804 09/22/17 2001 09/23/17 0529  BP: (!) 157/79 109/66 124/84 138/79  Pulse: 82 85 77 69  Resp: _0 Temp: (!) 97.4 F (36.3 C) 98.1 F (36.7 C) 98.2 F (36.8 C) 98.1 F (36.7 C)  TempSrc: Oral Oral Oral Oral  SpO2: 95% 95% 96% 94%  Weight:      Height:        Last BM Date: 09/15/17  Intake/Output   Yesterday:  06/21 0701 - 06/22 0700 In: 2285 [P.O.:1110; I.V.:1150] Out: 200 [Urine:200] This shift:  No intake/output data recorded.  Bowel function:  Flatus: YES  BM:  YES  Drain: G tube site clean   Physical Exam:  General: Pt awake/alert/oriented x4 in no acute distress Eyes: PERRL, normal EOM.  Sclera clear.  No icterus Neuro: CN II-XII intact w/o focal sensory/motor deficits. Lymph: No head/neck/groin lymphadenopathy Psych:  No delerium/psychosis/paranoia HENT: Normocephalic, Mucus membranes moist.  No thrush Neck: Supple, No tracheal deviation Chest: No chest wall pain w good excursion CV:  Pulses intact.  Regular rhythm MS: Normal AROM mjr joints.  No obvious deformity  Abdomen: Soft.  Nondistended.  Nontender.  Dressing removed.  Incision clean dry and intact.  Gastrostomy tube site clean.  No evidence of peritonitis.  No incarcerated hernias.  Ext:   No deformity.  No mjr edema.  No cyanosis Skin: No petechiae / purpura  Results:   Labs: Results for orders placed or performed during the hospital encounter of 09/16/17 (from the past 48 hour(s))  CBC     Status: Abnormal   Collection Time: 09/22/17  5:06  AM  Result Value Ref Range   WBC 12.1 (H) 4.0 - 10.5 K/uL   RBC 4.10 (L) 4.22 - 5.81 MIL/uL   Hemoglobin 13.1 13.0 - 17.0 g/dL   HCT 40.2 39.0 - 52.0 %   MCV 98.0 78.0 - 100.0 fL   MCH 32.0 26.0 - 34.0 pg   MCHC 32.6 30.0 - 36.0 g/dL   RDW 13.3 11.5 - 15.5 %   Platelets 251 150 - 400 K/uL    Comment: Performed at [facility], [place] [address], [place], [place] [address]  Basic metabolic panel     Status: Abnormal   Collection Time: 09/22/17  5:06 AM  Result Value Ref Range   Sodium 139 135 - 145 mmol/L   Potassium 4.5 3.5 - 5.1 mmol/L   Chloride 108 101 - 111 mmol/L   CO2 25 22 - 32 mmol/L   Glucose, Bld 111 (H) 65 - 99 mg/dL   BUN 14 6 - 20 mg/dL   Creatinine, Ser 0.88 0.61 - 1.24 mg/dL   Calcium 9.1 8.9 - 10.3 mg/dL   GFR calc non Af Amer >60 >60 mL/min   GFR calc Af Amer >60 >60 mL/min    Comment: (NOTE) The eGFR has been calculated using the CKD EPI equation. This calculation has not been validated in all clinical situations. eGFR's persistently <60 mL/min signify possible Chronic Kidney Disease.    Anion gap 6 5 - 15    Comment: Performed at [facility], [place] [address], [place], [place] [address]  CBC     Status: Abnormal   Collection Time: 09/23/17  5:02 AM  Result Value Ref Range   WBC 8.1 4.0 - 10.5 K/uL   RBC 3.84 (L) 4.22 - 5.81 MIL/uL   Hemoglobin 12.1 (L) 13.0 - 17.0 g/dL   HCT 37.8 (L) 39.0 - 52.0 %   MCV 98.4 78.0 - 100.0 fL   MCH 31.5 26.0 - 34.0 pg   MCHC 32.0 30.0 - 36.0 g/dL   RDW 13.4 11.5 - 15.5 %   Platelets 222 150 - 400 K/uL    Comment: Performed at [facility], [place] [address] [place], [place] [address]    Imaging / Studies: No results found.  Medications / Allergies: per chart  Antibiotics: Anti-infectives (From admission, onward)   Start     Dose/Rate Route Frequency Ordered Stop   09/20/17 1030  ciprofloxacin (CIPRO) IVPB 400 mg     400 mg 200 mL/hr over 60 Minutes  Intravenous On call to O.R. 09/20/17 1019 09/20/17 1445   09/20/17 1000  ceFAZolin (ANCEF) IVPB 2g/100 mL premix  Status:  Discontinued     2 g 200 mL/hr over 30 Minutes Intravenous On call to O.R. 09/20/17 0953 09/20/17 1015        Note: Portions of this report may have been transcribed using voice recognition software. Every effort was made to ensure accuracy; however, inadvertent computerized transcription errors may be present.   Any transcriptional errors that result from this process are unintentional.  Adin Hector, MD, FACS, MASCRS Gastrointestinal and Minimally Invasive Surgery    [address], [place], [place] [address] [phone] Main / Paging [phone] Fax

## 2017-09-23 NOTE — Discharge Summary (Signed)
Physician Discharge Summary    Patient ID: Jorge [name] MRN: [id number] DOB/AGE: 82/03/1930  84 y.o.  Admit date: 09/16/2017 Discharge date: 09/23/2017   Hospital Stay = 7 days  Patient Care Team: [name], Aleksei V, MD as PCP - General Dahlstedt, Stephen, MD (Urology) Gessner, Carl E, MD (Gastroenterology) Martin, Matthew, MD as Consulting Physician (General Surgery) Gerhardt, Edward B, MD as Consulting Physician (Cardiothoracic Surgery)  Discharge Diagnoses:  Principal Problem:   Large hiatal hernia Active Problems:   Essential hypertension   GERD   Prostate cancer (HCC)   Small bowel obstruction (HCC)   3 Days Post-Op  09/20/2017  POST-OPERATIVE DIAGNOSIS:   Recurrent hiatal hernia  SURGERY:  09/20/2017  Procedure(s): INSERTION OF GASTROSTOMY TUBE  SURGEON:    Surgeon(s): Byerly, Faera, MD  Consults: GI  Hospital Course:   Pleasant patient with history of recurrent hiatal hernia.  Difficult re-repair with breakdown.  Reherniation with stomach and transverse colon.  Worsening p.o. tolerance.  Admitted.  Hydrated.  Underwent laparotomy with takedown of stomach and gastrostomy tube placement.  Postoperatively, the patient gradually mobilized and advanced to a solid diet.  Pain and other symptoms were treated aggressively.    By the time of discharge, the patient was walking well the hallways, eating food, having flatus.  Pain was well-controlled on an oral medications.  Based on meeting discharge criteria and continuing to recover, I felt it was safe for the patient to be discharged from the hospital to further recover with close followup. Postoperative recommendations were discussed in detail.  They are written as well.  Discharged Condition: good  Disposition:  Follow-up Information    Martin, Matthew, MD Follow up in 2 week(s).   Specialty:  General Surgery Contact information: [address] [place] [address] [phone]        [facility] Follow up in 1 week(s).   Specialty:  General Surgery Why:  staple removal.   Contact information: [address]  [place] [address] [phone]          Discharge disposition: 01-Home or Self Care       Discharge Instructions    Call MD for:   Complete by:  As directed    FEVER > 101.5 F  (temperatures < 101.5 F are not significant)   Call MD for:  extreme fatigue   Complete by:  As directed    Call MD for:  persistant dizziness or light-headedness   Complete by:  As directed    Call MD for:  persistant nausea and vomiting   Complete by:  As directed    Call MD for:  redness, tenderness, or signs of infection (pain, swelling, redness, odor or green/yellow discharge around incision site)   Complete by:  As directed    Call MD for:  severe uncontrolled pain   Complete by:  As directed    Diet - low sodium heart healthy   Complete by:  As directed    Start with a bland diet such as soups, liquids, starchy foods, low fat foods, etc. the first few days at home. Gradually advance to a solid, low-fat, high fiber diet by the end of the first week at home.   Add a fiber supplement to your diet (Metamucil, etc) If you feel full, bloated, or constipated, stay on a full liquid or pureed/blenderized diet for a few days until you feel better and are no longer constipated.   Discharge instructions     Complete by:  As directed    See Discharge Instructions If you are not getting better after two weeks or are noticing you are getting worse, contact our office [phone] for further advice.  We may need to adjust your medications, re-evaluate you in the office, send you to the emergency room, or see what other things we can do to help. The clinic staff is available to answer your questions during regular business hours (8:30am-5pm).  Please don't hesitate to call and ask to speak to one of our nurses for clinical concerns.     A surgeon from [facility] is always on call at the hospitals 24 hours/day If you have a medical emergency, go to the nearest emergency room or call 911.   Discharge wound care:   Complete by:  As directed    It is good for closed incisions and even open wounds to be washed every day.  Shower every day.  Short baths are fine.  Wash the incisions and wounds clean with soap & water.     You may leave closed incisions open to air if it is dry.   You may cover the incision with clean gauze & replace it after your daily shower for comfort.  If you have skin staples, set up an appointment for them to be removed in the office in 10 days after surgery.   With your Gastrostomy "G" tube in your left upper abdomen, wash around the skin exit site with soap & water and place a new dressing of gauze or band aid around the skin every day.  Keep the drain site clean & dry.   Driving Restrictions   Complete by:  As directed    You may drive when: - you are no longer taking narcotic prescription pain medication - you can comfortably wear a seatbelt - you can safely make sudden turns/stops without pain.   Increase activity slowly   Complete by:  As directed    Start light daily activities --- self-care, walking, climbing stairs- beginning the day after surgery.  Gradually increase activities as tolerated.  Control your pain to be active.  Stop when you are tired.  Ideally, walk several times a day, eventually an hour a day.   Most people are back to most day-to-day activities in a few weeks.  It takes 4-6 weeks to get back to unrestricted, intense activity. If you can walk 30 minutes without difficulty, it is safe to try more intense activity such as jogging, treadmill, bicycling, low-impact aerobics, swimming, etc. Save the most intensive and strenuous activity for last (Usually 4-8 weeks after surgery) such as sit-ups, heavy lifting, contact sports, etc.  Refrain from any intense heavy lifting  or straining until you are off narcotics for pain control.  You will have off days, but things should improve week-by-week. DO NOT PUSH THROUGH PAIN.  Let pain be your guide: If it hurts to do something, don't do it.   Lifting restrictions   Complete by:  As directed    If you can walk 30 minutes without difficulty, it is safe to try more intense activity such as jogging, treadmill, bicycling, low-impact aerobics, swimming, etc. Save the most intensive and strenuous activity for last (Usually 4-8 weeks after surgery) such as sit-ups, heavy lifting, contact sports, etc.   Refrain from any intense heavy lifting or straining until you are off narcotics for pain control.  You will have off days, but things should improve week-by-week. DO  NOT PUSH THROUGH PAIN.  Let pain be your guide: If it hurts to do something, don't do it.  Pain is your body warning you to avoid that activity for another week until the pain goes down.   May shower / Bathe   Complete by:  As directed    May walk up steps   Complete by:  As directed    Sexual Activity Restrictions   Complete by:  As directed    You may have sexual intercourse when it is comfortable. If it hurts to do something, stop.      Allergies as of 09/23/2017      Reactions   Aspirin Other (See Comments)   Ulcerated stomach   Azithromycin Shortness Of Breath   Cefuroxime Axetil Shortness Of Breath   Iodinated Diagnostic Agents Anaphylaxis   Iodine Other (See Comments)   Per pt tremors and dyspnea   Other Shortness Of Breath   Twist in esophagus so patient can not take large pills/capsules   Naproxen Other (See Comments)   REACTION: upset stomach - like with other NSAIDs      Medication List    TAKE these medications   acetaminophen 500 MG tablet Commonly known as:  TYLENOL Take 1,000 mg by mouth daily.   CALCIUM 600/VITAMIN D PO Take 1 tablet by mouth daily.   losartan 100 MG tablet Commonly known as:  COZAAR Take 1 tablet (100 mg  total) by mouth daily.   LUPRON IJ Inject 0.2 mLs into the skin every 4 (four) months.   naphazoline-pheniramine 0.025-0.3 % ophthalmic solution Commonly known as:  NAPHCON-A Place 1 drop into both eyes daily.   STOOL SOFTENER PO Take 1 tablet by mouth as needed (constipation).   traMADol 50 MG tablet Commonly known as:  ULTRAM Take 1-2 tablets (50-100 mg total) by mouth every 6 (six) hours as needed for moderate pain or severe pain (pain not relieved by Tylenol).   TYLENOL PM EXTRA STRENGTH PO Take 1 tablet by mouth as needed (pain/sleep).            Discharge Care Instructions  (From admission, onward)        Start     Ordered   09/23/17 0000  Discharge wound care:    Comments:  It is good for closed incisions and even open wounds to be washed every day.  Shower every day.  Short baths are fine.  Wash the incisions and wounds clean with soap & water.     You may leave closed incisions open to air if it is dry.   You may cover the incision with clean gauze & replace it after your daily shower for comfort.  If you have skin staples, set up an appointment for them to be removed in the office in 10 days after surgery.   With your Gastrostomy "G" tube in your left upper abdomen, wash around the skin exit site with soap & water and place a new dressing of gauze or band aid around the skin every day.  Keep the drain site clean & dry.   09/23/17 0936      Significant Diagnostic Studies:  Results for orders placed or performed during the hospital encounter of 09/16/17 (from the past 72 hour(s))  CBC     Status: Abnormal   Collection Time: 09/22/17  5:06 AM  Result Value Ref Range   WBC 12.1 (H) 4.0 - 10.5 K/uL   RBC 4.10 (L) 4.22 - 5.81 MIL/uL  Hemoglobin 13.1 13.0 - 17.0 g/dL   HCT 40.2 39.0 - 52.0 %   MCV 98.0 78.0 - 100.0 fL   MCH 32.0 26.0 - 34.0 pg   MCHC 32.6 30.0 - 36.0 g/dL   RDW 13.3 11.5 - 15.5 %   Platelets 251 150 - 400 K/uL    Comment: Performed at  [facility], [address] [place], [place] [address]  Basic metabolic panel     Status: Abnormal   Collection Time: 09/22/17  5:06 AM  Result Value Ref Range   Sodium 139 135 - 145 mmol/L   Potassium 4.5 3.5 - 5.1 mmol/L   Chloride 108 101 - 111 mmol/L   CO2 25 22 - 32 mmol/L   Glucose, Bld 111 (H) 65 - 99 mg/dL   BUN 14 6 - 20 mg/dL   Creatinine, Ser 0.88 0.61 - 1.24 mg/dL   Calcium 9.1 8.9 - 10.3 mg/dL   GFR calc non Af Amer >60 >60 mL/min   GFR calc Af Amer >60 >60 mL/min    Comment: (NOTE) The eGFR has been calculated using the CKD EPI equation. This calculation has not been validated in all clinical situations. eGFR's persistently <60 mL/min signify possible Chronic Kidney Disease.    Anion gap 6 5 - 15    Comment: Performed at [facility], [address] [place], [place] [address]  CBC     Status: Abnormal   Collection Time: 09/23/17  5:02 AM  Result Value Ref Range   WBC 8.1 4.0 - 10.5 K/uL   RBC 3.84 (L) 4.22 - 5.81 MIL/uL   Hemoglobin 12.1 (L) 13.0 - 17.0 g/dL   HCT 37.8 (L) 39.0 - 52.0 %   MCV 98.4 78.0 - 100.0 fL   MCH 31.5 26.0 - 34.0 pg   MCHC 32.0 30.0 - 36.0 g/dL   RDW 13.4 11.5 - 15.5 %   Platelets 222 150 - 400 K/uL    Comment: Performed at [facility], [address] , [place] [address]    Ct Abdomen Pelvis Wo Contrast  Result Date: 09/19/2017 CLINICAL DATA:  Constipation. Preop planning for gastrostomy catheter. EXAM: CT ABDOMEN AND PELVIS WITHOUT CONTRAST TECHNIQUE: Multidetector CT imaging of the abdomen and pelvis was performed following the standard protocol without IV contrast. COMPARISON:  09/16/2017 FINDINGS: Lower chest: Large recurrent hiatal hernia involving much of gastric fundus and body, splenic flexure of colon, and mesenteric fat. No pleural or pericardial effusion. Hepatobiliary: No focal liver abnormality is seen. No gallstones, gallbladder wall thickening, or biliary  dilatation. Pancreas: Marked diffuse atrophy without mass or ductal dilatation. Spleen: Normal in size without focal abnormality. Adrenals/Urinary Tract: Adrenal glands are unremarkable. Kidneys are normal, without renal calculi, focal lesion, or hydronephrosis. Urinary bladder is incompletely distended with multiple small diverticula from the dome. Stomach/Bowel: Much of the stomach is involved in a large recurrent hiatal hernia. Nasogastric tube is in place. Stomach is nondilated. The stomach is remote from the anterior abdominal wall greater than 10 cm with intervening loops of small bowel. Multiple small bowel loops are interposed between the stomach and anterior abdominal wall. Small bowel is nondilated. Appendix surgically absent. Distal transverse colon and splenic flexure are involved in hiatal hernia, without evidence of obstruction or strangulation. Scattered diverticula from distal descending and sigmoid portions of the colon without adjacent inflammatory/edematous change or wall thickening. Vascular/Lymphatic: Scattered aortoiliac arterial calcifications without aneurysm. 16 mm retrocrural node image 19/2. Left para-aortic adenopathy   up to 12 mm short axis diameter, left common iliac adenopathy up to a 13 mm short axis diameter, left external iliac adenopathy up to a 14 mm diameter, progressive since 04/18/2017. Bilateral pelvic phleboliths. Reproductive: Changes of prostatectomy. Other: No ascites.  No free air. Musculoskeletal: Spondylitic changes throughout the lumbar spine. sclerotic lesions in L3 and bilateral supra-acetabular , left worse than right, progressive since 04/18/2017. IMPRESSION: 1. Large recurrent hiatal hernia involving much of the stomach as well as distal transverse colon and splenic flexure, without obstruction or strangulation. 2. Interval gastric decompression with nasogastric tube. 3. Small bowel loops interposed between the stomach and anterior abdominal wall, complicating  approach for percutaneous gastrostomy. Consider surgical placement. 4. Progressive left pelvic and retroperitoneal adenopathy and sclerotic bone metastases since earlier study of 04/18/2017. 5. Descending and sigmoid diverticulosis. 6.  Aortic Atherosclerosis (ICD10-170.0) Electronically Signed   By: Lucrezia Europe M.D.   On: 09/19/2017 16:39    Discharge Exam: Blood pressure 138/79, pulse 69, temperature 98.1 F (36.7 C), temperature source Oral, resp. rate 18, height 6' 1.5" (1.867 m), weight 96 kg (211 lb 10.3 oz), SpO2 94 %.  General: Pt awake/alert/oriented x4 in No acute distress Eyes: PERRL, normal EOM.  Sclera clear.  No icterus Neuro: CN II-XII intact w/o focal sensory/motor deficits. Lymph: No head/neck/groin lymphadenopathy Psych:  No delerium/psychosis/paranoia HENT: Normocephalic, Mucus membranes moist.  No thrush Neck: Supple, No tracheal deviation Chest: No chest wall pain w good excursion CV:  Pulses intact.  Regular rhythm MS: Normal AROM mjr joints.  No obvious deformity Abdomen: Soft.  Nondistended.  Nontender.  Upper midline incision closed with staples in place.  Left upper quadrant gastrostomy tube clean and dry.  No evidence of peritonitis.  No incarcerated hernias. Ext:  SCDs BLE.  No mjr edema.  No cyanosis Skin: No petechiae / purpura  Past Medical History:  Diagnosis Date  . Anxiety   . ED (erectile dysfunction)   . Gastric ulcer    Dr. Ardis Hughs  . GERD (gastroesophageal reflux disease)   . Hernia, hiatal   . History of colonic polyps   . Hx of radiation therapy   . Hypertension   . Insomnia   . Prostate cancer [facility]) 2010   Dr. Luberta Robertson  . Syncope 2011   from Bicalutamide  . Vitamin D deficiency     Past Surgical History:  Procedure Laterality Date  . APPENDECTOMY     age 29  . CYST REMOVAL NECK N/A 10/16/2014   Procedure: EXCISION CYST POSTERIOR NECK;  Surgeon: Autumn Messing III, MD;  Location: [facility];  Service: General;  Laterality:  N/A;  posterior  . ESOPHAGOGASTRODUODENOSCOPY  06/05/2011   Procedure: ESOPHAGOGASTRODUODENOSCOPY (EGD);  Surgeon: Gatha Mayer, MD;  Location: [facility] ENDOSCOPY;  Service: Endoscopy;  Laterality: N/A;  . ESOPHAGOGASTRODUODENOSCOPY N/A 09/16/2017   Procedure: ESOPHAGOGASTRODUODENOSCOPY (EGD);  Surgeon: Carol Ada, MD;  Location: [facility];  Service: Endoscopy;  Laterality: N/A;  . GASTROSTOMY N/A 09/20/2017   Procedure: INSERTION OF GASTROSTOMY TUBE;  Surgeon: Stark Klein, MD;  Location: [facility];  Service: General;  Laterality: N/A;  . HERNIA REPAIR  06/10/2011      . HIATAL HERNIA REPAIR  06/10/2011   Procedure: LAPAROSCOPIC REPAIR OF HIATAL HERNIA;  Surgeon: Pedro Earls, MD;  Location: [facility];  Service: General;  Laterality: N/A;  Laparoscopic repair of paraesophageal hernia.  [name] PROSTATECTOMY  2008  . TRANSURETHRAL RESECTION OF PROSTATE      Social History  Socioeconomic History  . Marital status: Widowed    Spouse name: Not on file  . Number of children: Not on file  . Years of education: Not on file  . Highest education level: Not on file  Occupational History  . Occupation: retired    Employer: RETIRED  Social Needs  . Financial resource strain: Not on file  . Food insecurity:    Worry: Not on file    Inability: Not on file  . Transportation needs:    Medical: Not on file    Non-medical: Not on file  Tobacco Use  . Smoking status: Former Smoker  . Smokeless tobacco: Never Used  Substance and Sexual Activity  . Alcohol use: No    Comment: social  . Drug use: No  . Sexual activity: Yes  Lifestyle  . Physical activity:    Days per week: Not on file    Minutes per session: Not on file  . Stress: Not on file  Relationships  . Social connections:    Talks on phone: Not on file    Gets together: Not on file    Attends religious service: Not on file    Active member of club or organization: Not on file    Attends meetings of clubs or organizations: Not on file     Relationship status: Not on file  . Intimate partner violence:    Fear of current or ex partner: Not on file    Emotionally abused: Not on file    Physically abused: Not on file    Forced sexual activity: Not on file  Other Topics Concern  . Not on file  Social History Narrative   Regular exercise- yes/golf    Family History  Problem Relation Age of Onset  . Pancreatic cancer Father   . Hypertension Father   . Cancer Father   . Hypertension Other     Current Facility-Administered Medications  Medication Dose Route Frequency Provider Last Rate Last Dose  . 0.9 %  sodium chloride infusion  250 mL Intravenous PRN Gross, Steven, MD      . acetaminophen (TYLENOL) tablet 1,000 mg  1,000 mg Oral Q8H Jennings, [place], PA-C   1,000 mg at 09/23/17 0624  . alum & mag hydroxide-simeth (MAALOX/MYLANTA) 200-200-20 MG/5ML suspension 30 mL  30 mL Oral Q6H PRN Gross, Steven, MD      . chlorhexidine (PERIDEX) 0.12 % solution 15 mL  15 mL Mouth Rinse BID Jennings, [place], PA-C   15 mL at 09/22/17 2155  . famotidine (PEPCID) tablet 20 mg  20 mg Oral BID Zeigler, Dustin G, RPH   20 mg at 09/23/17 0922  . guaiFENesin-dextromethorphan (ROBITUSSIN DM) 100-10 MG/5ML syrup 10 mL  10 mL Oral Q4H PRN Gross, Steven, MD      . heparin injection 5,000 Units  5,000 Units Subcutaneous Q8H Jennings, [place], PA-C   5,000 Units at 09/23/17 0624  . hydrocortisone (ANUSOL-HC) 2.5 % rectal cream 1 application  1 application Topical QID PRN Gross, Steven, MD      . hydrocortisone cream 1 % 1 application  1 application Topical TID PRN Gross, Steven, MD      . lactated ringers bolus 1,000 mL  1,000 mL Intravenous Q8H PRN Gross, Steven, MD      . lip balm (CARMEX) ointment 1 application  1 application Topical BID Gross, Steven, MD   1 application at 09/23/17 0924  . losartan (COZAAR) tablet 50 mg  50 mg Oral Daily Jennings,   Percell Miller, PA-C   50 mg at 09/23/17 3785  . magic mouthwash  15 mL Oral QID PRN Michael Boston, MD       . MEDLINE mouth rinse  15 mL Mouth Rinse q12n4p Earnstine Regal, PA-C   15 mL at 09/19/17 1700  . menthol-cetylpyridinium (CEPACOL) lozenge 3 mg  1 lozenge Oral PRN Michael Boston, MD      . morphine 2 MG/ML injection 1-2 mg  1-2 mg Intravenous Q2H PRN Earnstine Regal, PA-C   2 mg at 09/22/17 0827  . ondansetron (ZOFRAN-ODT) disintegrating tablet 4 mg  4 mg Oral Q6H PRN Earnstine Regal, PA-C       Or  . ondansetron [facility]) injection 4 mg  4 mg Intravenous Q6H PRN Earnstine Regal, PA-C      . phenol (CHLORASEPTIC) mouth spray 1-2 spray  1-2 spray Mouth/Throat PRN Michael Boston, MD      . polyethylene glycol (MIRALAX / GLYCOLAX) packet 17 g  17 g Oral Daily Michael Boston, MD   17 g at 09/23/17 0931  . senna (SENOKOT) tablet 8.6 mg  1 tablet Oral BID Jill Alexanders, PA-C   8.6 mg at 09/23/17 8850  . sodium chloride flush (NS) 0.9 % injection 3 mL  3 mL Intravenous Gorden Harms, MD   3 mL at 09/23/17 0932  . sodium chloride flush (NS) 0.9 % injection 3 mL  3 mL Intravenous PRN Michael Boston, MD      . traMADol Veatrice Bourbon) tablet 50-100 mg  50-100 mg Oral Q6H PRN Earnstine Regal, PA-C   50 mg at 09/22/17 1354     Allergies  Allergen Reactions  . Aspirin Other (See Comments)    Ulcerated stomach  . Azithromycin Shortness Of Breath  . Cefuroxime Axetil Shortness Of Breath  . Iodinated Diagnostic Agents Anaphylaxis  . Iodine Other (See Comments)    Per pt tremors and dyspnea  . Other Shortness Of Breath    Twist in esophagus so patient can not take large pills/capsules  . Naproxen Other (See Comments)    REACTION: upset stomach - like with other NSAIDs    Signed: Morton Peters, MD, FACS, MASCRS Gastrointestinal and Minimally Invasive Surgery    [address], [place], [place] [address] [phone] Main / Paging [phone] Fax   09/23/2017, 9:37 AM

## 2017-09-25 ENCOUNTER — Telehealth: Payer: Self-pay | Admitting: *Deleted

## 2017-09-25 NOTE — Telephone Encounter (Signed)
Pt was on TCM report admitted 09/16/17 for Large hiatal hernia. Pt underwent laparotomy with takedown of stomach and gastrostomy tube placement. Pt will f/u in 1 week @ [facility] [place] [facility] to have staples removed, and in 2 week will see surgeon Dr. Hassell Done. Does not need to make TCM hosp f/u w/PCP.Marland KitchenJohny Chess

## 2017-10-04 DIAGNOSIS — C61 Malignant neoplasm of prostate: Secondary | ICD-10-CM | POA: Diagnosis not present

## 2017-10-04 DIAGNOSIS — Z5111 Encounter for antineoplastic chemotherapy: Secondary | ICD-10-CM | POA: Diagnosis not present

## 2017-10-30 ENCOUNTER — Inpatient Hospital Stay: Payer: Medicare Other | Attending: Oncology | Admitting: Oncology

## 2017-10-30 ENCOUNTER — Telehealth: Payer: Self-pay

## 2017-10-30 VITALS — BP 163/70 | HR 73 | Temp 98.0°F | Resp 17 | Ht 73.5 in | Wt 209.5 lb

## 2017-10-30 DIAGNOSIS — C7951 Secondary malignant neoplasm of bone: Secondary | ICD-10-CM | POA: Diagnosis not present

## 2017-10-30 DIAGNOSIS — I10 Essential (primary) hypertension: Secondary | ICD-10-CM | POA: Diagnosis not present

## 2017-10-30 DIAGNOSIS — Z923 Personal history of irradiation: Secondary | ICD-10-CM | POA: Insufficient documentation

## 2017-10-30 DIAGNOSIS — C61 Malignant neoplasm of prostate: Secondary | ICD-10-CM | POA: Insufficient documentation

## 2017-10-30 NOTE — Progress Notes (Signed)
  [name] OFFICE PROGRESS NOTE   Diagnosis: Prostate cancer  INTERVAL HISTORY:   Jorge [name] returns for a scheduled visit.  He continues every 18-month Lupron with Dr. Diona Fanti.  He reports feeling well.  He underwent placement of a gastrostomy tube last month for management of a gastric outlet obstruction secondary to a recurrent hiatal hernia.   Objective:  Vital signs in last 24 hours:  Blood pressure (!) 163/70, pulse 73, temperature 98 F (36.7 C), temperature source Oral, resp. rate 17, height 6' 1.5" (1.867 m), weight 209 lb 8 oz (95 kg), SpO2 97 %.    HEENT: Neck without mass Lymphatics: No cervical, supraclavicular, or inguinal nodes Resp: Lungs clear bilaterally Cardio: Regular rate and rhythm GI: No hepatomegaly, left upper quadrant gastrostomy tube site with mild surrounding erythema Vascular: No leg edema   Lab Results:  Lab Results  Component Value Date   WBC 8.1 09/23/2017   HGB 12.1 (L) 09/23/2017   HCT 37.8 (L) 09/23/2017   MCV 98.4 09/23/2017   PLT 222 09/23/2017   NEUTROABS 12.7 (H) 09/16/2017    CMP  Lab Results  Component Value Date   NA 139 09/22/2017   K 4.5 09/22/2017   CL 108 09/22/2017   CO2 25 09/22/2017   GLUCOSE 111 (H) 09/22/2017   BUN 14 09/22/2017   CREATININE 0.88 09/22/2017   CALCIUM 9.1 09/22/2017   PROT 7.8 09/16/2017   ALBUMIN 4.3 09/16/2017   AST 35 09/16/2017   ALT 16 (L) 09/16/2017   ALKPHOS 180 (H) 09/16/2017   BILITOT 0.8 09/16/2017   GFRNONAA >60 09/22/2017   GFRAA >60 09/22/2017      Medications: I have reviewed the patient's current medications.   Assessment/Plan: 1.  Prostate cancer, Gleason 7, status post a prostatectomy in 2008  Elevated PSA 2011, status post external beam radiation and androgen deprivation therapy, radiation completed March 2011, last injection July 2011  Elevated PSA October 2016, CT with periaortic and obturator lymphadenopathy  Androgen deprivation therapy  resumed and continues, now on every 66-month Lupron  Bone scan 04/18/2017- new foci of metastatic disease at the right supra-acetabulum, left acetabulum, low cervical spine, and L2  CT 04/18/2017-left iliac, periaortic, and retrocrural lymphadenopathy  2.  Hypertension  3.  Recurrent hiatal hernia-status post gastrostomy tube placement 09/20/2017   Disposition: Jorge [name] appears stable.  He will continue every 54-month Lupron with Dr. Diona Fanti.  He will schedule a restaging bone scan via Dr. Diona Fanti.  He continues calcium/vitamin D.  Jorge [name] is scheduled to see Dr. Diona Fanti next month.  He will return for an office visit here in April 2020.  I am available to see him sooner as needed.  15 minutes were spent with the patient today.  The majority of the time was used for counseling and coordination of care.  Betsy Coder, MD  10/30/2017  3:31 PM

## 2017-10-30 NOTE — Telephone Encounter (Signed)
Printed avs and calender of upcoming appointment. Per 7/729 los

## 2017-11-17 DIAGNOSIS — C7951 Secondary malignant neoplasm of bone: Secondary | ICD-10-CM | POA: Diagnosis not present

## 2017-11-17 DIAGNOSIS — C61 Malignant neoplasm of prostate: Secondary | ICD-10-CM | POA: Diagnosis not present

## 2017-11-21 ENCOUNTER — Other Ambulatory Visit: Payer: Self-pay | Admitting: Urology

## 2017-11-21 DIAGNOSIS — C61 Malignant neoplasm of prostate: Secondary | ICD-10-CM

## 2017-12-05 ENCOUNTER — Encounter (HOSPITAL_COMMUNITY)
Admission: RE | Admit: 2017-12-05 | Discharge: 2017-12-05 | Disposition: A | Discharge status: Home / Self Care | Payer: Medicare Other | Source: Ambulatory Visit | Attending: Urology | Admitting: Urology

## 2017-12-05 DIAGNOSIS — C61 Malignant neoplasm of prostate: Secondary | ICD-10-CM | POA: Insufficient documentation

## 2017-12-05 DIAGNOSIS — C7951 Secondary malignant neoplasm of bone: Secondary | ICD-10-CM | POA: Insufficient documentation

## 2017-12-05 MED ORDER — TECHNETIUM TC 99M MEDRONATE IV KIT
20.0000 | PACK | Freq: Once | INTRAVENOUS | Status: AC | PRN
  Administered 2017-12-05: 21.9 via INTRAVENOUS

## 2017-12-14 DIAGNOSIS — H26493 Other secondary cataract, bilateral: Secondary | ICD-10-CM | POA: Diagnosis not present

## 2017-12-14 DIAGNOSIS — H3562 Retinal hemorrhage, left eye: Secondary | ICD-10-CM | POA: Diagnosis not present

## 2017-12-14 DIAGNOSIS — H35052 Retinal neovascularization, unspecified, left eye: Secondary | ICD-10-CM | POA: Diagnosis not present

## 2017-12-14 DIAGNOSIS — H43811 Vitreous degeneration, right eye: Secondary | ICD-10-CM | POA: Diagnosis not present

## 2017-12-22 ENCOUNTER — Encounter: Payer: Self-pay | Admitting: Internal Medicine

## 2017-12-22 ENCOUNTER — Ambulatory Visit: Payer: Medicare Other | Admitting: Internal Medicine

## 2017-12-22 VITALS — BP 160/88 | HR 78 | Temp 97.8°F | Ht 73.5 in | Wt 206.0 lb

## 2017-12-22 DIAGNOSIS — C61 Malignant neoplasm of prostate: Secondary | ICD-10-CM

## 2017-12-22 DIAGNOSIS — Z23 Encounter for immunization: Secondary | ICD-10-CM

## 2017-12-22 DIAGNOSIS — K56609 Unspecified intestinal obstruction, unspecified as to partial versus complete obstruction: Secondary | ICD-10-CM | POA: Diagnosis not present

## 2017-12-22 DIAGNOSIS — E559 Vitamin D deficiency, unspecified: Secondary | ICD-10-CM | POA: Diagnosis not present

## 2017-12-22 DIAGNOSIS — G4700 Insomnia, unspecified: Secondary | ICD-10-CM | POA: Diagnosis not present

## 2017-12-22 MED ORDER — TRIAMCINOLONE ACETONIDE 0.5 % EX CREA: 1.0000 "application " | TOPICAL_CREAM | Freq: Four times a day (QID) | CUTANEOUS | 3 refills | Status: DC

## 2017-12-22 NOTE — Assessment & Plan Note (Signed)
Metastatic  F/u w/Dr Benay Spice  F/u w/Dr Diona Fanti 2008 S/p Prostatectomy, XRT and Lupron

## 2017-12-22 NOTE — Assessment & Plan Note (Signed)
Vit D 

## 2017-12-22 NOTE — Addendum Note (Signed)
Addended by: Karren Cobble on: 12/22/2017 02:05 PM   Modules accepted: Orders

## 2017-12-22 NOTE — Progress Notes (Signed)
Subjective:  Patient ID: Jorge [name], male    DOB: 07/24/1929  Age: 84 y.o. MRN: [id number]  CC: No chief complaint on file.   HPI Jorge [name] presents for [facility], OA, prostate ca C/o L hip pain - chronic   Outpatient Medications Prior to Visit  Medication Sig Dispense Refill  . acetaminophen (TYLENOL) 500 MG tablet Take 1,000 mg by mouth daily.     . Calcium Carbonate-Vitamin D (CALCIUM 600/VITAMIN D PO) Take 1 tablet by mouth daily.    . Diphenhydramine-APAP, sleep, (TYLENOL PM EXTRA STRENGTH PO) Take 1 tablet by mouth as needed (pain/sleep).     [name] Calcium (STOOL SOFTENER PO) Take 1 tablet by mouth as needed (constipation).     . Leuprolide Acetate (LUPRON IJ) Inject 0.2 mLs into the skin every 4 (four) months.    [name] losartan (COZAAR) 100 MG tablet Take 1 tablet (100 mg total) by mouth daily. 90 tablet 3  . naphazoline-pheniramine (NAPHCON-A) 0.025-0.3 % ophthalmic solution Place 1 drop into both eyes daily.    . traMADol (ULTRAM) 50 MG tablet Take 1-2 tablets (50-100 mg total) by mouth every 6 (six) hours as needed for moderate pain or severe pain (pain not relieved by Tylenol). 20 tablet 0   No facility-administered medications prior to visit.     ROS: Review of Systems  Constitutional: Negative for appetite change, fatigue and unexpected weight change.  HENT: Negative for congestion, nosebleeds, sneezing, sore throat and trouble swallowing.   Eyes: Negative for itching and visual disturbance.  Respiratory: Negative for cough.   Cardiovascular: Negative for chest pain, palpitations and leg swelling.  Gastrointestinal: Negative for abdominal distention, blood in stool, diarrhea and nausea.  Genitourinary: Negative for frequency and hematuria.  Musculoskeletal: Positive for arthralgias. Negative for back pain, gait problem, joint swelling and neck pain.  Skin: Negative for rash.  Neurological: Negative for dizziness, tremors, speech difficulty and weakness.    Psychiatric/Behavioral: Negative for agitation, dysphoric mood, sleep disturbance and suicidal ideas. The patient is not nervous/anxious.     Objective:  BP (!) 160/88 (BP Location: Left Arm, Patient Position: Sitting, Cuff Size: Normal)   Pulse 78   Temp 97.8 F (36.6 C) (Oral)   Ht 6' 1.5" (1.867 m)   Wt 206 lb (93.4 kg)   SpO2 96%   BMI 26.81 kg/m   BP Readings from Last 3 Encounters:  12/22/17 (!) 160/88  10/30/17 (!) 163/70  09/23/17 138/79    Wt Readings from Last 3 Encounters:  12/22/17 206 lb (93.4 kg)  10/30/17 209 lb 8 oz (95 kg)  09/21/17 211 lb 10.3 oz (96 kg)    Physical Exam  Constitutional: He is oriented to person, place, and time. He appears well-developed. No distress.  NAD  HENT:  Mouth/Throat: Oropharynx is clear and moist.  Eyes: Pupils are equal, round, and reactive to light. Conjunctivae are normal.  Neck: Normal range of motion. No JVD present. No thyromegaly present.  Cardiovascular: Normal rate, regular rhythm, normal heart sounds and intact distal pulses. Exam reveals no gallop and no friction rub.  No murmur heard. Pulmonary/Chest: Effort normal and breath sounds normal. No respiratory distress. He has no wheezes. He has no rales. He exhibits no tenderness.  Abdominal: Soft. Bowel sounds are normal. He exhibits no distension and no mass. There is no tenderness. There is no rebound and no guarding.  Musculoskeletal: Normal range of motion. He exhibits no edema or tenderness.  Lymphadenopathy:    He  has no cervical adenopathy.  Neurological: He is alert and oriented to person, place, and time. He has normal reflexes. No cranial nerve deficit. He exhibits normal muscle tone. He displays a negative Romberg sign. Coordination and gait normal.  Skin: Skin is warm and dry. No rash noted.  Psychiatric: He has a normal mood and affect. His behavior is normal. Judgment and thought content normal.  obese L abd wound is healing   Lab Results   Component Value Date   WBC 8.1 09/23/2017   HGB 12.1 (L) 09/23/2017   HCT 37.8 (L) 09/23/2017   PLT 222 09/23/2017   GLUCOSE 111 (H) 09/22/2017   CHOL 164 10/21/2015   TRIG 116.0 10/21/2015   HDL 52.20 10/21/2015   LDLCALC 88 10/21/2015   ALT 16 (L) 09/16/2017   AST 35 09/16/2017   NA 139 09/22/2017   K 4.5 09/22/2017   CL 108 09/22/2017   CREATININE 0.88 09/22/2017   BUN 14 09/22/2017   CO2 25 09/22/2017   TSH 1.26 10/21/2015   PSA 0.46 01/10/2007   INR 0.99 09/20/2017   HGBA1C 6.1 (H) 01/10/2007    Nm Bone Scan Whole Body  Result Date: 12/06/2017 CLINICAL DATA:  Prostate cancer, PSA = 46.0 EXAM: NUCLEAR MEDICINE WHOLE BODY BONE SCAN TECHNIQUE: Whole body anterior and posterior images were obtained approximately 3 hours after intravenous injection of radiopharmaceutical. RADIOPHARMACEUTICALS:  21.9 mCi Technetium-41m MDP IV COMPARISON:  04/18/2017 Radiographic correlation: CT abdomen and pelvis 09/19/2017 FINDINGS: Multiple sites of abnormal increased osseous tracer accumulation are identified consistent with osseous metastatic disease. These include lower LEFT cervical spine, lower thoracic spine, lumbar spine, BILATERAL pelvis, and likely the posterior RIGHT eighth rib. Uptake in the pelvis and lumbar spine is progressive. Uptake at lower thoracic spine, RIGHT SI joint, and posterior RIGHT eighth rib is new. Questionable focus of increased tracer localization at RIGHT lesser trochanter versus superimposed soft tissue artifact, cannot exclude subtle proximal RIGHT femoral metastasis; no abnormality seen at this site on CT of 09/19/2017. New LEFT hydronephrosis and proximal hydroureter. Otherwise expected urinary tract and soft tissue distribution of tracer. IMPRESSION: Progressive osseous metastatic disease as above. Cannot exclude subtle metastatic lesion at the proximal RIGHT femur at the lesser trochanteric region; recommend radiographic correlation. Electronically Signed   By: Lavonia Dana M.D.   On: 12/06/2017 09:07    Assessment & Plan:   There are no diagnoses linked to this encounter.   No orders of the defined types were placed in this encounter.    Follow-up: No follow-ups on file.  [name], MD

## 2017-12-22 NOTE — Assessment & Plan Note (Signed)
Doing well 

## 2017-12-22 NOTE — Assessment & Plan Note (Signed)
F/u /Dr Barry Dienes

## 2017-12-27 ENCOUNTER — Telehealth: Payer: Self-pay | Admitting: Oncology

## 2017-12-27 NOTE — Telephone Encounter (Signed)
Scheduled appt per 9/25 sch message - left message for patient with appt date and time. Sent reminder letter in the mail .

## 2017-12-28 ENCOUNTER — Encounter (HOSPITAL_COMMUNITY): Payer: Self-pay | Admitting: Emergency Medicine

## 2017-12-28 ENCOUNTER — Emergency Department (HOSPITAL_COMMUNITY): Payer: Medicare Other

## 2017-12-28 ENCOUNTER — Emergency Department (HOSPITAL_COMMUNITY)
Admission: EM | Admit: 2017-12-28 | Discharge: 2017-12-28 | Disposition: A | Discharge status: Home / Self Care | Payer: Medicare Other | Source: Home / Self Care | Attending: Emergency Medicine | Admitting: Emergency Medicine

## 2017-12-28 ENCOUNTER — Other Ambulatory Visit: Payer: Self-pay

## 2017-12-28 DIAGNOSIS — Z888 Allergy status to other drugs, medicaments and biological substances status: Secondary | ICD-10-CM | POA: Diagnosis not present

## 2017-12-28 DIAGNOSIS — K449 Diaphragmatic hernia without obstruction or gangrene: Secondary | ICD-10-CM | POA: Diagnosis not present

## 2017-12-28 DIAGNOSIS — Z8546 Personal history of malignant neoplasm of prostate: Secondary | ICD-10-CM | POA: Insufficient documentation

## 2017-12-28 DIAGNOSIS — R1084 Generalized abdominal pain: Secondary | ICD-10-CM | POA: Diagnosis not present

## 2017-12-28 DIAGNOSIS — R1030 Lower abdominal pain, unspecified: Secondary | ICD-10-CM

## 2017-12-28 DIAGNOSIS — Z87891 Personal history of nicotine dependence: Secondary | ICD-10-CM | POA: Insufficient documentation

## 2017-12-28 DIAGNOSIS — C7951 Secondary malignant neoplasm of bone: Secondary | ICD-10-CM | POA: Diagnosis not present

## 2017-12-28 DIAGNOSIS — N289 Disorder of kidney and ureter, unspecified: Secondary | ICD-10-CM | POA: Diagnosis not present

## 2017-12-28 DIAGNOSIS — Z85038 Personal history of other malignant neoplasm of large intestine: Secondary | ICD-10-CM | POA: Diagnosis not present

## 2017-12-28 DIAGNOSIS — Z9079 Acquired absence of other genital organ(s): Secondary | ICD-10-CM | POA: Diagnosis not present

## 2017-12-28 DIAGNOSIS — R590 Localized enlarged lymph nodes: Secondary | ICD-10-CM | POA: Diagnosis not present

## 2017-12-28 DIAGNOSIS — R1032 Left lower quadrant pain: Secondary | ICD-10-CM | POA: Diagnosis not present

## 2017-12-28 DIAGNOSIS — K5900 Constipation, unspecified: Secondary | ICD-10-CM | POA: Diagnosis not present

## 2017-12-28 DIAGNOSIS — I10 Essential (primary) hypertension: Secondary | ICD-10-CM | POA: Diagnosis not present

## 2017-12-28 DIAGNOSIS — Z9889 Other specified postprocedural states: Secondary | ICD-10-CM

## 2017-12-28 DIAGNOSIS — F419 Anxiety disorder, unspecified: Secondary | ICD-10-CM | POA: Diagnosis not present

## 2017-12-28 DIAGNOSIS — Z86010 Personal history of colonic polyps: Secondary | ICD-10-CM | POA: Diagnosis not present

## 2017-12-28 DIAGNOSIS — Z79899 Other long term (current) drug therapy: Secondary | ICD-10-CM

## 2017-12-28 DIAGNOSIS — N1330 Unspecified hydronephrosis: Secondary | ICD-10-CM | POA: Diagnosis not present

## 2017-12-28 DIAGNOSIS — Z8719 Personal history of other diseases of the digestive system: Secondary | ICD-10-CM | POA: Diagnosis not present

## 2017-12-28 DIAGNOSIS — Z881 Allergy status to other antibiotic agents status: Secondary | ICD-10-CM | POA: Diagnosis not present

## 2017-12-28 DIAGNOSIS — Z91041 Radiographic dye allergy status: Secondary | ICD-10-CM | POA: Diagnosis not present

## 2017-12-28 DIAGNOSIS — Z886 Allergy status to analgesic agent status: Secondary | ICD-10-CM | POA: Diagnosis not present

## 2017-12-28 DIAGNOSIS — I700 Atherosclerosis of aorta: Secondary | ICD-10-CM | POA: Diagnosis not present

## 2017-12-28 DIAGNOSIS — Z923 Personal history of irradiation: Secondary | ICD-10-CM | POA: Diagnosis not present

## 2017-12-28 DIAGNOSIS — E559 Vitamin D deficiency, unspecified: Secondary | ICD-10-CM | POA: Diagnosis not present

## 2017-12-28 DIAGNOSIS — R109 Unspecified abdominal pain: Secondary | ICD-10-CM | POA: Diagnosis not present

## 2017-12-28 DIAGNOSIS — K219 Gastro-esophageal reflux disease without esophagitis: Secondary | ICD-10-CM | POA: Diagnosis not present

## 2017-12-28 DIAGNOSIS — R52 Pain, unspecified: Secondary | ICD-10-CM | POA: Diagnosis not present

## 2017-12-28 DIAGNOSIS — Z8249 Family history of ischemic heart disease and other diseases of the circulatory system: Secondary | ICD-10-CM | POA: Diagnosis not present

## 2017-12-28 DIAGNOSIS — G4700 Insomnia, unspecified: Secondary | ICD-10-CM | POA: Diagnosis not present

## 2017-12-28 DIAGNOSIS — R1031 Right lower quadrant pain: Secondary | ICD-10-CM | POA: Diagnosis not present

## 2017-12-28 DIAGNOSIS — N1339 Other hydronephrosis: Secondary | ICD-10-CM | POA: Diagnosis not present

## 2017-12-28 LAB — URINALYSIS, ROUTINE W REFLEX MICROSCOPIC
BILIRUBIN URINE: NEGATIVE
Glucose, UA: NEGATIVE mg/dL
HGB URINE DIPSTICK: NEGATIVE
Ketones, ur: 5 mg/dL — AB
Leukocytes, UA: NEGATIVE
Nitrite: NEGATIVE
Protein, ur: NEGATIVE mg/dL
SPECIFIC GRAVITY, URINE: 1.011 (ref 1.005–1.030)
pH: 8 (ref 5.0–8.0)

## 2017-12-28 LAB — COMPREHENSIVE METABOLIC PANEL
ALBUMIN: 3.6 g/dL (ref 3.5–5.0)
ALK PHOS: 223 U/L — AB (ref 38–126)
ALT: 14 U/L (ref 0–44)
ANION GAP: 9 (ref 5–15)
AST: 26 U/L (ref 15–41)
BUN: 12 mg/dL (ref 8–23)
CO2: 23 mmol/L (ref 22–32)
CREATININE: 1.08 mg/dL (ref 0.61–1.24)
Calcium: 9.3 mg/dL (ref 8.9–10.3)
Chloride: 108 mmol/L (ref 98–111)
GFR calc Af Amer: >60 mL/min (ref >60)
GFR calc non Af Amer: >60 mL/min (ref >60)
GLUCOSE: 128 mg/dL — AB (ref 70–99)
Potassium: 4.3 mmol/L (ref 3.5–5.1)
SODIUM: 140 mmol/L (ref 135–145)
Total Bilirubin: 0.7 mg/dL (ref 0.3–1.2)
Total Protein: 6.6 g/dL (ref 6.5–8.1)

## 2017-12-28 LAB — CBC
HCT: 42 % (ref 39.0–52.0)
HEMOGLOBIN: 14.1 g/dL (ref 13.0–17.0)
MCH: 32.5 pg (ref 26.0–34.0)
MCHC: 33.6 g/dL (ref 30.0–36.0)
MCV: 96.8 fL (ref 78.0–100.0)
Platelets: 222 10*3/uL (ref 150–400)
RBC: 4.34 MIL/uL (ref 4.22–5.81)
RDW: 13.2 % (ref 11.5–15.5)
WBC: 9.8 10*3/uL (ref 4.0–10.5)

## 2017-12-28 LAB — LIPASE, BLOOD: Lipase: 26 U/L (ref 11–51)

## 2017-12-28 MED ORDER — ACETAMINOPHEN 500 MG PO TABS: 1000.0000 mg | ORAL_TABLET | Freq: Three times a day (TID) | ORAL | Status: DC

## 2017-12-28 MED ORDER — MORPHINE SULFATE (PF) 4 MG/ML IV SOLN
4.0000 mg | INTRAVENOUS | Status: DC | PRN
  Administered 2017-12-28 (×2): 4 mg via INTRAVENOUS
  Filled 2017-12-28 (×2): qty 1

## 2017-12-28 MED ORDER — TRAMADOL HCL 50 MG PO TABS: 50.0000 mg | ORAL_TABLET | Freq: Four times a day (QID) | ORAL | 0 refills | Status: DC | PRN

## 2017-12-28 MED ORDER — TRAMADOL HCL 50 MG PO TABS
50.0000 mg | ORAL_TABLET | Freq: Four times a day (QID) | ORAL | Status: DC | PRN
  Administered 2017-12-28: 50 mg via ORAL
  Filled 2017-12-28: qty 1

## 2017-12-28 NOTE — ED Provider Notes (Signed)
[facility]orge [name] DEPT Provider Note   CSN: [id number] Arrival date & time: 12/28/17  0354     History   Chief Complaint Chief Complaint  Patient presents with  . Abdominal Pain    HPI Jorge [name] is a 84 y.o. male.  HPI 84 year old male comes in with chief complaint of abdominal pain.  Patient has history of hiatal hernia with complications, status post gastrostomy tube placement.  Patient reports that he woke up this morning because of severe abdominal pain.  Pain is located in the lower quadrants of the abdomen, particularly on the left side.  Patient's gastrostomy surgery was in July, and he reports no complications from it.  Review of system is negative for nausea, vomiting, fevers, chills, diarrhea, severe constipation.  Patient also has history of small bowel obstruction.  Past Medical History:  Diagnosis Date  . Anxiety   . ED (erectile dysfunction)   . Gastric ulcer    Dr. Ardis Hughs  . GERD (gastroesophageal reflux disease)   . Hernia, hiatal   . History of colonic polyps   . Hx of radiation therapy   . Hypertension   . Insomnia   . Prostate cancer [facility]) 2010   Dr. Luberta Robertson  . Syncope 2011   from Bicalutamide  . Vitamin D deficiency     Patient Active Problem List   Diagnosis Date Noted  . Small bowel obstruction ([place]) 09/16/2017  . Osteoarthritis 09/12/2017  . Hot flash due to medication 04/05/2017  . Squamous cell skin cancer 11/15/2016  . Prostate cancer ([place]) 02/17/2016  . Elbow pain 10/21/2015  . Insomnia 08/18/2014  . Sebaceous cyst 08/18/2014  . Cerumen impaction 07/23/2013  . Eczema 08/23/2012  . Pain in joint, shoulder region 08/23/2012  . LBP (low back pain) 05/21/2012  . Actinic keratoses 10/26/2011  . Contact dermatitis 09/22/2011  . Large hiatal hernia 07/13/2011  . Nonspecific (abnormal) findings on radiological and other examination of gastrointestinal tract 06/05/2011  . LUQ abdominal pain 04/22/2011  .  Constipation 04/22/2011  . Chest pain, atypical 04/22/2011  . Irreducible hiatal hernia 04/22/2011  . Neoplasm of uncertain behavior of skin 11/10/2010  . HIP PAIN 04/28/2010  . Diarrhea 10/07/2008  . ERECTILE DYSFUNCTION 08/22/2008  . PERSONAL HX COLON CANCER 11/06/2007  . INSOMNIA, PERSISTENT 08/17/2007  . Vitamin D deficiency 04/19/2007  . Anxiety state 04/19/2007  . Essential hypertension 04/19/2007  . GERD 04/19/2007  . PEPTIC ULCER DISEASE 04/19/2007  . CERVICAL STRAIN 04/19/2007  . COLONIC POLYPS, HX OF 04/19/2007    Past Surgical History:  Procedure Laterality Date  . APPENDECTOMY     age 3  . CYST REMOVAL NECK N/A 10/16/2014   Procedure: EXCISION CYST POSTERIOR NECK;  Surgeon: Autumn Messing III, MD;  Location: [place];  Service: General;  Laterality: N/A;  posterior  . ESOPHAGOGASTRODUODENOSCOPY  06/05/2011   Procedure: ESOPHAGOGASTRODUODENOSCOPY (EGD);  Surgeon: Gatha Mayer, MD;  [facility] END[facility]COPY;  Service: Endoscopy;  Laterality: N/A;  . ESOPHAGOGASTRODUODENOSCOPY N/A 09/16/2017   Procedure: ESOPHAGOGASTRODUODENOSCOPY (EGD);  Surgeon: Carol Ada, MD;  Location: [facility];  Service: Endoscopy;  Laterality: N/A;  . GASTROSTOMY N/A 09/20/2017   Procedure: INSERTION OF GASTROSTOMY TUBE;  Surgeon: Stark Klein, MD;  Location: [facility];  Service: General;  Laterality: N/A;  . HERNIA REPAIR  06/10/2011      . HIATAL HERNIA REPAIR  06/10/2011   Procedure: LAPAROSCOPIC REPAIR OF HIATAL HERNIA;  Surgeon: Pedro Earls, MD;  Location: [place];  Service: General;  Laterality: N/A;  Laparoscopic repair of paraesophageal hernia.  [name] PROSTATECTOMY  2008  . TRANSURETHRAL RESECTION OF PROSTATE          Home Medications    Prior to Admission medications   Medication Sig Start Date End Date Taking? Authorizing Provider  Calcium-Phosphorus-Vitamin D (CALCIUM/VITAMIN D3/ADULT GUMMY PO) Take 2 each by mouth daily.   Yes [provider]    Diphenhydramine-APAP, sleep, (TYLENOL PM EXTRA STRENGTH PO) Take 1 tablet by mouth as needed (pain/sleep).    Yes [provider]  Docusate Calcium (STOOL SOFTENER PO) Take 1 tablet by mouth at bedtime.    Yes [provider]  Leuprolide Acetate (LUPRON IJ) Inject 0.2 mLs into the skin every 4 (four) months.   Yes [provider]  losartan (COZAAR) 100 MG tablet Take 1 tablet (100 mg total) by mouth daily. 04/05/17  Yes [name], [name], MD  naphazoline-pheniramine (NAPHCON-A) 0.025-0.3 % ophthalmic solution Place 3 drops into both eyes daily.    Yes [provider]  triamcinolone cream (KENALOG) 0.5 % Apply 1 application topically 4 (four) times daily. 12/22/17 12/22/18 Yes [name], [name], MD  traMADol (ULTRAM) 50 MG tablet Take 1-2 tablets (50-100 mg total) by mouth every 6 (six) hours as needed for moderate pain or severe pain (pain not relieved by Tylenol). 12/28/17   Davonna Belling, MD    Family History Family History  Problem Relation Age of Onset  . Pancreatic cancer Father   . Hypertension Father   . Cancer Father   . Hypertension Other     Social History Social History   Tobacco Use  . Smoking status: Former Research scientist (life sciences)  . Smokeless tobacco: Never Used  Substance Use Topics  . Alcohol use: No    Comment: social  . Drug use: No     Allergies   Aspirin; Azithromycin; Cefuroxime axetil; Iodinated diagnostic agents; Iodine; Other; and Naproxen   Review of Systems Review of Systems  Constitutional: Positive for activity change.  Respiratory: Negative for shortness of breath.   Cardiovascular: Negative for chest pain.  Gastrointestinal: Positive for abdominal pain.  Allergic/Immunologic: Negative for immunocompromised state.  Hematological: Does not bruise/bleed easily.  All other systems reviewed and are negative.    Physical Exam Updated Vital Signs BP (!) 195/129 (BP Location: Right Arm)   Pulse 89   Temp 98.6 F (37  C) (Oral)   Resp 17   SpO2 96%   Physical Exam  Constitutional: He is oriented to person, place, and time. He appears well-developed.  HENT:  Head: Atraumatic.  Neck: Neck supple.  Cardiovascular: Normal rate.  Pulmonary/Chest: Effort normal.  Abdominal: There is tenderness in the left lower quadrant. There is guarding. There is no rigidity and no rebound.  Generalized lower quadrant tenderness, worse in the left lower quadrant.  Gastrostomy tube insertion site appears to be healing well.  Neurological: He is alert and oriented to person, place, and time.  Skin: Skin is warm.  Nursing note and vitals reviewed.    ED Treatments / Results  Labs (all labs ordered are listed, but only abnormal results are displayed) Labs Reviewed  COMPREHENSIVE METABOLIC PANEL - Abnormal; Notable for the following components:      Result Value   Glucose, Bld 128 (*)    Alkaline Phosphatase 223 (*)    All other components within normal limits  URINALYSIS, ROUTINE W REFLEX MICROSCOPIC - Abnormal; Notable for the following components:   Ketones, ur 5 (*)  All other components within normal limits  LIPASE, BLOOD  CBC    EKG None  Radiology Ct Abdomen Pelvis Wo Contrast  Result Date: 12/28/2017 CLINICAL DATA:  Left lower quadrant pain EXAM: CT ABDOMEN AND PELVIS WITHOUT CONTRAST TECHNIQUE: Multidetector CT imaging of the abdomen and pelvis was performed following the standard protocol without IV contrast. COMPARISON:  09/19/2017 FINDINGS: Lower chest: Large hiatal hernia is again identified with the majority of the stomach within the chest cavity. Some associated left atelectatic changes are noted similar to that seen on prior exams. Some multifocal scarring is noted in the right lower lobe stable from the previous exam. Adjacent to the pericardium best visualized on image number 16 of series 2 there is a focal 12 mm soft tissue nodular density which is stable from previous exams likely related to  a small lymph node. Hepatobiliary: No focal liver abnormality is seen. No gallstones, gallbladder wall thickening, or biliary dilatation. Pancreas: Unremarkable. No pancreatic ductal dilatation or surrounding inflammatory changes. Spleen: Normal in size without focal abnormality. Adrenals/Urinary Tract: Adrenal glands are within normal limits. The right kidney is well visualize without focal abnormality. The left kidney demonstrates no renal calculi although hydronephrosis and hydroureter is noted. Caliber change is noted as the ureter crosses the left common iliac artery although no definitive calculus is seen at this time. The bladder is decompressed. Stomach/Bowel: Scattered diverticular change of the colon is noted without obstructive change. The splenic flexure lies within known hiatal hernia. No definitive obstructive changes are noted within the small bowel. The appendix is not visualized consistent with prior surgical history. Prior gastrostomy catheter tract is noted. Vascular/Lymphatic: Aortic atherosclerotic change is noted without aneurysmal dilatation. Scattered lymph nodes are again noted in the retrocrural region, periaortic region and left iliac chain stable from the prior exam. Again these are stable from the prior exam. Reproductive: Prostate has been surgically removed consistent with the given clinical history. Other: No abdominal wall hernia or abnormality. No abdominopelvic ascites. Musculoskeletal: Degenerative changes of the lumbar spine are noted. Stable appearing sclerotic acetabular lesions left greater than right are noted. Stable sclerotic changes in the L3 vertebral body are again seen. These are consistent with the given clinical history of metastatic disease. IMPRESSION: Progressive left-sided hydronephrosis and proximal hydroureter. There is a caliber change as the ureter crosses over the common iliac artery on the left. These changes may be related to some scarring. This may be the  etiology of the patient's underlying discomfort. Stable appearing retroperitoneal and left iliac adenopathy consistent with the given clinical history. Stable likely lymph node along the pericardial margin on the right is noted Stable appearing sclerotic bony metastatic disease. Large hiatal hernia stable in appearance from the prior exam. Electronically Signed   By: Inez Catalina M.D.   On: 12/28/2017 11:21   Dg Abd 2 Views  Result Date: 12/28/2017 CLINICAL DATA:  Abdominal pain. EXAM: ABDOMEN - 2 VIEW COMPARISON:  CT 12/28/2017.  KUB 09/16/2017. FINDINGS: Soft tissue structures are unremarkable. Prominent hiatal hernia again noted without interim change. Slightly distended loops of bowel show most likely colon with scattered air-fluid levels noted. An active process such as a diarrheal illness/colitis cannot be excluded. Adynamic ileus may be present. Follow-up exam to exclude developing obstruction suggested. No free air. Degenerative change lumbar spine. Ankylosis lumbar spine consistent with ankylosing spondylitis. Sclerotic changes noted about the left pubis and acetabulum, and right ilium. This could be from metastatic disease. This could be from a process such as  Paget's disease. Reference made to prior CT report which suggests bony metastatic disease. No acute bony abnormality. IMPRESSION: 1. Slightly distended loops of bowel, most likely the colon. Scattered air-fluid levels noted. An active process such as a diarrheal illness/colitis cannot be excluded. Adynamic ileus may be present. Follow-up exam to exclude developing bowel obstruction suggested. No free air. Prominent hiatal hernia again noted without interim change. 2. Degenerative changes lumbar spine. Ankylosing spondylitis. Sclerotic changes left pubis and acetabulum, and right ilium. This could be from metastatic disease. This could be from a process such as Paget's disease. Reference made to CT report which suggest bony metastatic disease.  Electronically Signed   By: Marcello Moores  Register   On: 12/28/2017 15:16    Procedures Procedures (including critical care time)  Medications Ordered in ED Medications - No data to display   Initial Impression / Assessment and Plan / ED Course  I have reviewed the triage vital signs and the nursing notes.  Pertinent labs & imaging results that were available during my care of the patient were reviewed by me and considered in my medical decision making (see chart for details).  Clinical Course as of Dec 30 1650  Thu Dec 28, 2017  1155 CT scan is showing hydronephrosis and hydroureters. No obstructive stone. UA is clean.  Patient continues to have some discomfort, therefore we have consulted urology to see if they have any input.  CT ABDOMEN PELVIS WO CONTRAST [AN]    Clinical Course User Index [AN] Varney Biles, MD    84 year old male comes in with chief complaint of left lower quadrant abdominal pain. Patient has history of recurrent hiatal hernia and has had had complications from it and he is s/p gastrostomy tube placement.  On exam patient has guarding without any rebound tenderness.  Differential diagnosis includes diverticulitis, intra-abdominal abscess versus hematoma, perforation, small bowel obstruction.  4:52 PM  Surgery team has been consulted, given the recent surgery and the persistent abdominal discomfort described as similar to the hernia pain. They have assessed the patient, and for now have recommended p.o. challenge.  They will give [place] the final disposition upon the reassessment. Dr. Alvino Chapel to f/u.  Final Clinical Impressions(s) / ED Diagnoses   Final diagnoses:  Hypertension, unspecified type  Abdominal pain, unspecified abdominal location    ED Discharge Orders         Ordered    traMADol (ULTRAM) 50 MG tablet  Every 6 hours PRN     12/28/17 1820           Varney Biles, MD 12/29/17 1653

## 2017-12-28 NOTE — ED Notes (Signed)
Pt was soiled upon arrival to ED. PT made aware of need for urine specimen.

## 2017-12-28 NOTE — ED Triage Notes (Signed)
Patient here from home, lives alone. In for left lower quadrant abd pain. Recent abd surgery in July. Denies n/v/d. Last BM yesterday and was normal.

## 2017-12-28 NOTE — Progress Notes (Signed)
CC:  Acute abdominal pain left flank this AM  Subjective: Patient is an 84 year old male who presented to the ED on 09/16/2017, with a recurrent hiatal hernia and duodenal obstruction.  His original hiatal hernia repair was done on 06/10/2011 by Dr. Johnathan Hausen.  He was admitted by Dr. Harlow Asa on 6/15..  We attempted to place an NG tube and this failed he ultimately went to IR and then Dr. Saralyn Pilar HUNG placed an NG endoscopically.  He was seen by Dr. Hassell Done on 09/17/2017 who did his original hiatal hernia surgery.  It was his opinion the patient had a type IV hiatal hernia with volvulus that was causing his admission.  It was his opinion that the patient needed a Gastropexy.  He had ongoing NG drainage and at that point we plan to place a gastrostomy tube to help secure the stomach in the abdomen he was not a candidate for reoperation for the hiatal hernia.  Gastroenterology and IR were both contacted and neither could do the tube placement.  He was subsequently taken to the operating room by Dr. Stark Klein on 09/21/2018.  At that time he underwent an open gastrostomy tube placement using a 22 French Moss tube.  After placement of the tube and stabilization we started clamping trials of the G-tube and advanced his diet.  He was subsequently discharged home on 09/23/2017 on full liquids.  He was scheduled follow-up with Dr. Johnathan Hausen.  His last visit was 11/27/17.  Tube was working well at the time.  He was seen by Dr. Alain Marion on 12/22/2017.  For his hiatal hernia he has osteoarthritis his prostate cancer and his left hip pain which was chronic. Today he presented to the ED from home with acute left lower quadrant pain he denied nausea vomiting or diarrhea has bowel movements have been normal the last one yesterday. He has been on a regular diet with no ill affects prior to today. Dr. Hassell Done removed the gastrostomy tube on 11/12/17, aside from some skin changes at the site of the prior G tube he has  had no issues since the tube was placed.   Work-up in the ED shows he is afebrile but hypertensive admission blood pressure was 211/102 at 7 AM.  The last blood pressure at 11 AM is 200/109.  He is not tachycardic and O2 sats are normal.  Labs show a glucose of 128 and alk phos of 223.  The remainder of his CMP is normal.  CBC shows a white count of 9.8, hematocrit of 42, hemoglobin of 14 platelet count 222,000.  Urinalysis is normal. CT of the abdomen and pelvis: Shows a large hiatal hernia is again identified with the majority of the stomach within the chest cavity some associated left atelectatic changes multifocal scarring is noted in the right lower lobe stable from previous exams.  Adjacent pericardium is a 12 mm soft tissue nodule noted on the last exam.  Stomach shows diverticular changes of the colon is noted without obstructive changes splenic flexure lies within known hiatal hernia.  No definitive obstruction noted within the small bowel the appendix was not visualized consistent with prior surgical history and the gastrostomy catheter was intact.  He did have some hydronephrosis and hydroureter on the left.  We are ask to see.  Review of Systems  Constitutional: Negative.   HENT: Negative.   Eyes: Negative.   Respiratory: Negative.   Cardiovascular: Negative.   Gastrointestinal: Positive for abdominal pain (LLQ pain). Negative  for blood in stool, constipation, diarrhea, nausea and vomiting.  Genitourinary: Negative.   Musculoskeletal: Negative.   Skin: Negative.   Neurological: Negative.   Endo/Heme/Allergies: Negative.   Psychiatric/Behavioral: Negative.     Objective: Vital signs in last 24 hours: Temp:  [98.6 F (37 C)] 98.6 F (37 C) (09/26 0723) Pulse Rate:  [73-80] 78 (09/26 1145) Resp:  [19-22] 19 (09/26 1145) BP: (189-211)/(102-109) 200/109 (09/26 1200) SpO2:  [94 %-98 %] 96 % (09/26 1145)    Intake/Output from previous day: No intake/output data  recorded. Intake/Output this shift: No intake/output data recorded.  General appearance: alert, cooperative and no distress Resp: clear to auscultation bilaterally Cardio: regular rate and rhythm, S1, S2 normal, no murmur, click, rub or gallop GI: soft, non-tender; bowel sounds normal; no masses,  no organomegaly and still having pain in the left flank, better with Morphine Extremities: extremities normal, atraumatic, no cyanosis or edema  Lab Results:  Recent Labs    12/28/17 0745  WBC 9.8  HGB 14.1  HCT 42.0  PLT 222    BMET Recent Labs    12/28/17 0745  NA 140  K 4.3  CL 108  CO2 23  GLUCOSE 128*  BUN 12  CREATININE 1.08  CALCIUM 9.3   PT/INR No results for input(s): LABPROT, INR in the last 72 hours.  Recent Labs  Lab 12/28/17 0745  AST 26  ALT 14  ALKPHOS 223*  BILITOT 0.7  PROT 6.6  ALBUMIN 3.6     Lipase     Component Value Date/Time   LIPASE 26 12/28/2017 0745     Prior to Admission medications   Medication Sig Start Date End Date Taking? Authorizing Provider  Calcium-Phosphorus-Vitamin D (CALCIUM/VITAMIN D3/ADULT GUMMY PO) Take 2 each by mouth daily.   Yes [provider]  Diphenhydramine-APAP, sleep, (TYLENOL PM EXTRA STRENGTH PO) Take 1 tablet by mouth as needed (pain/sleep).    Yes [provider]  Docusate Calcium (STOOL SOFTENER PO) Take 1 tablet by mouth at bedtime.    Yes [provider]  Leuprolide Acetate (LUPRON IJ) Inject 0.2 mLs into the skin every 4 (four) months.   Yes [provider]  losartan (COZAAR) 100 MG tablet Take 1 tablet (100 mg total) by mouth daily. 04/05/17  Yes [name], [name], MD  naphazoline-pheniramine (NAPHCON-A) 0.025-0.3 % ophthalmic solution Place 3 drops into both eyes daily.    Yes [provider]  triamcinolone cream (KENALOG) 0.5 % Apply 1 application topically 4 (four) times daily. 12/22/17 12/22/18 Yes [name], [name], MD  traMADol (ULTRAM) 50 MG tablet  Take 1-2 tablets (50-100 mg total) by mouth every 6 (six) hours as needed for moderate pain or severe pain (pain not relieved by Tylenol). Patient not taking: Reported on 12/28/2017 09/23/17   Michael Boston, MD    Medications:   Assessment/Plan  Acute abdominal pain Hiatal hernia repair 06/10/11 Dr. Hassell Done Recurrent Hiatal hernia with gastric outlet obstruction Open gastrostomy tube, 32 Fr Moss, 09/20/17, Stark Klein GERD/gastric ulcer hx Hypertension Hx of prostate ca with radiation Rx- Lupron RX -  Plan:  Dr. Barry Dienes reviewed the CT and we also got a 2 view film.  It is her opinion the pt has most likely had a separation of the stomach from the abdominal wall.  There is no leak, he is still somewhat tender, but otherwise fine.  He just got additional Morphine.  His blood pressure has been very high during his stay in the hospital.  It is her recommendation that we give him some clears, try him on oral pain medicines, and see how he does.  If he needs to come in for BP control will defer to Medicine.  No surgical indication right now.  I spoke to Dr. Alvino Chapel in the ED who is going to take over for now.         LOS: 0 days    Elmarie Devlin 12/28/2017 [phone]

## 2017-12-28 NOTE — ED Provider Notes (Addendum)
  Physical Exam  BP (!) 195/129 (BP Location: Right Arm)   Pulse 89   Temp 98.6 F (37 C) (Oral)   Resp 17   SpO2 96%   Physical Exam  ED Course/Procedures   Clinical Course as of Dec 29 1851  Thu Dec 28, 2017  1155 CT scan is showing hydronephrosis and hydroureters. No obstructive stone. UA is clean.  Patient continues to have some discomfort, therefore we have consulted urology to see if they have any input.  CT ABDOMEN PELVIS WO CONTRAST [AN]    Clinical Course User Index [AN] Varney Biles, MD    Procedures  MDM  Received patient in signout.  Had been seen by general surgery.  If tolerates pain medicines and orals can go home.  Patient has eaten without vomiting and pain is been controlled.  Does have a hypertension but had not taken his medicine today.  I think it is safe for him to go home and take the medicines.  Discharge home.    Patient is now vomiting again.  Will admit to hospitalist.  Patient states now that it was just hiccups and will not stay.  Will discharge.      Davonna Belling, MD 12/28/17 Marko Stai    Davonna Belling, MD 12/28/17 1859

## 2017-12-28 NOTE — ED Notes (Signed)
Bed: WA01 Expected date:  Expected time:  Means of arrival:  Comments: EMS 29M abd pain

## 2017-12-30 ENCOUNTER — Emergency Department (HOSPITAL_COMMUNITY): Payer: Medicare Other

## 2017-12-30 ENCOUNTER — Inpatient Hospital Stay (HOSPITAL_COMMUNITY)
Admission: EM | Admit: 2017-12-30 | Discharge: 2018-01-01 | DRG: 694 | Disposition: A | Discharge status: Home / Self Care | Payer: Medicare Other | Attending: Internal Medicine | Admitting: Internal Medicine

## 2017-12-30 ENCOUNTER — Other Ambulatory Visit: Payer: Self-pay

## 2017-12-30 ENCOUNTER — Encounter (HOSPITAL_COMMUNITY): Payer: Self-pay | Admitting: Emergency Medicine

## 2017-12-30 DIAGNOSIS — Z923 Personal history of irradiation: Secondary | ICD-10-CM

## 2017-12-30 DIAGNOSIS — N1339 Other hydronephrosis: Secondary | ICD-10-CM | POA: Diagnosis not present

## 2017-12-30 DIAGNOSIS — Z79899 Other long term (current) drug therapy: Secondary | ICD-10-CM | POA: Diagnosis not present

## 2017-12-30 DIAGNOSIS — Z79891 Long term (current) use of opiate analgesic: Secondary | ICD-10-CM

## 2017-12-30 DIAGNOSIS — Z91041 Radiographic dye allergy status: Secondary | ICD-10-CM | POA: Diagnosis not present

## 2017-12-30 DIAGNOSIS — Z85038 Personal history of other malignant neoplasm of large intestine: Secondary | ICD-10-CM

## 2017-12-30 DIAGNOSIS — Z888 Allergy status to other drugs, medicaments and biological substances status: Secondary | ICD-10-CM

## 2017-12-30 DIAGNOSIS — K219 Gastro-esophageal reflux disease without esophagitis: Secondary | ICD-10-CM | POA: Diagnosis not present

## 2017-12-30 DIAGNOSIS — C61 Malignant neoplasm of prostate: Secondary | ICD-10-CM | POA: Diagnosis present

## 2017-12-30 DIAGNOSIS — I10 Essential (primary) hypertension: Secondary | ICD-10-CM | POA: Diagnosis present

## 2017-12-30 DIAGNOSIS — I700 Atherosclerosis of aorta: Secondary | ICD-10-CM | POA: Diagnosis not present

## 2017-12-30 DIAGNOSIS — R1032 Left lower quadrant pain: Secondary | ICD-10-CM | POA: Diagnosis not present

## 2017-12-30 DIAGNOSIS — R1012 Left upper quadrant pain: Secondary | ICD-10-CM | POA: Diagnosis present

## 2017-12-30 DIAGNOSIS — R1084 Generalized abdominal pain: Secondary | ICD-10-CM | POA: Diagnosis not present

## 2017-12-30 DIAGNOSIS — Z881 Allergy status to other antibiotic agents status: Secondary | ICD-10-CM

## 2017-12-30 DIAGNOSIS — N1330 Unspecified hydronephrosis: Secondary | ICD-10-CM | POA: Diagnosis not present

## 2017-12-30 DIAGNOSIS — Z9079 Acquired absence of other genital organ(s): Secondary | ICD-10-CM

## 2017-12-30 DIAGNOSIS — G4700 Insomnia, unspecified: Secondary | ICD-10-CM | POA: Diagnosis not present

## 2017-12-30 DIAGNOSIS — R590 Localized enlarged lymph nodes: Secondary | ICD-10-CM | POA: Diagnosis not present

## 2017-12-30 DIAGNOSIS — E559 Vitamin D deficiency, unspecified: Secondary | ICD-10-CM | POA: Diagnosis present

## 2017-12-30 DIAGNOSIS — K449 Diaphragmatic hernia without obstruction or gangrene: Secondary | ICD-10-CM

## 2017-12-30 DIAGNOSIS — Z886 Allergy status to analgesic agent status: Secondary | ICD-10-CM

## 2017-12-30 DIAGNOSIS — F419 Anxiety disorder, unspecified: Secondary | ICD-10-CM | POA: Diagnosis present

## 2017-12-30 DIAGNOSIS — Z87891 Personal history of nicotine dependence: Secondary | ICD-10-CM

## 2017-12-30 DIAGNOSIS — Z8249 Family history of ischemic heart disease and other diseases of the circulatory system: Secondary | ICD-10-CM

## 2017-12-30 DIAGNOSIS — K5900 Constipation, unspecified: Secondary | ICD-10-CM | POA: Diagnosis present

## 2017-12-30 DIAGNOSIS — Z8546 Personal history of malignant neoplasm of prostate: Secondary | ICD-10-CM

## 2017-12-30 DIAGNOSIS — N289 Disorder of kidney and ureter, unspecified: Secondary | ICD-10-CM | POA: Diagnosis not present

## 2017-12-30 DIAGNOSIS — Z86010 Personal history of colonic polyps: Secondary | ICD-10-CM

## 2017-12-30 DIAGNOSIS — Z8719 Personal history of other diseases of the digestive system: Secondary | ICD-10-CM | POA: Diagnosis not present

## 2017-12-30 DIAGNOSIS — C7951 Secondary malignant neoplasm of bone: Secondary | ICD-10-CM | POA: Diagnosis present

## 2017-12-30 DIAGNOSIS — R52 Pain, unspecified: Secondary | ICD-10-CM | POA: Diagnosis not present

## 2017-12-30 DIAGNOSIS — Z931 Gastrostomy status: Secondary | ICD-10-CM

## 2017-12-30 HISTORY — DX: Unspecified hydronephrosis: N13.30

## 2017-12-30 HISTORY — DX: Acquired absence of other genital organ(s): Z90.79

## 2017-12-30 HISTORY — DX: Secondary malignant neoplasm of bone: C79.51

## 2017-12-30 HISTORY — DX: Malignant neoplasm of prostate: C61

## 2017-12-30 LAB — CBC WITH DIFFERENTIAL/PLATELET
BASOS ABS: 0 10*3/uL (ref 0.0–0.1)
Basophils Relative: 0 %
EOS PCT: 0 %
Eosinophils Absolute: 0 10*3/uL (ref 0.0–0.7)
HCT: 43.6 % (ref 39.0–52.0)
HEMOGLOBIN: 14.6 g/dL (ref 13.0–17.0)
LYMPHS ABS: 1.1 10*3/uL (ref 0.7–4.0)
LYMPHS PCT: 8 %
MCH: 32.2 pg (ref 26.0–34.0)
MCHC: 33.5 g/dL (ref 30.0–36.0)
MCV: 96.2 fL (ref 78.0–100.0)
Monocytes Absolute: 1.4 10*3/uL — ABNORMAL HIGH (ref 0.1–1.0)
Monocytes Relative: 10 %
NEUTROS ABS: 11.7 10*3/uL — AB (ref 1.7–7.7)
NEUTROS PCT: 82 %
Platelets: 242 10*3/uL (ref 150–400)
RBC: 4.53 MIL/uL (ref 4.22–5.81)
RDW: 13.3 % (ref 11.5–15.5)
WBC: 14.2 10*3/uL — ABNORMAL HIGH (ref 4.0–10.5)

## 2017-12-30 LAB — URINALYSIS, ROUTINE W REFLEX MICROSCOPIC
BILIRUBIN URINE: NEGATIVE
GLUCOSE, UA: NEGATIVE mg/dL
HGB URINE DIPSTICK: NEGATIVE
Ketones, ur: 5 mg/dL — AB
Leukocytes, UA: NEGATIVE
Nitrite: NEGATIVE
PH: 8 (ref 5.0–8.0)
Protein, ur: NEGATIVE mg/dL
SPECIFIC GRAVITY, URINE: 1.009 (ref 1.005–1.030)

## 2017-12-30 LAB — COMPREHENSIVE METABOLIC PANEL
ALK PHOS: 199 U/L — AB (ref 38–126)
ALT: 12 U/L (ref 0–44)
AST: 27 U/L (ref 15–41)
Albumin: 3.7 g/dL (ref 3.5–5.0)
Anion gap: 11 (ref 5–15)
BUN: 18 mg/dL (ref 8–23)
CHLORIDE: 107 mmol/L (ref 98–111)
CO2: 24 mmol/L (ref 22–32)
CREATININE: 1.04 mg/dL (ref 0.61–1.24)
Calcium: 9.5 mg/dL (ref 8.9–10.3)
GFR calc non Af Amer: >60 mL/min (ref >60)
GLUCOSE: 101 mg/dL — AB (ref 70–99)
Potassium: 3.9 mmol/L (ref 3.5–5.1)
SODIUM: 142 mmol/L (ref 135–145)
Total Bilirubin: 0.6 mg/dL (ref 0.3–1.2)
Total Protein: 7.6 g/dL (ref 6.5–8.1)

## 2017-12-30 LAB — LIPASE, BLOOD: Lipase: 25 U/L (ref 11–51)

## 2017-12-30 LAB — POC OCCULT BLOOD, ED: FECAL OCCULT BLD: NEGATIVE

## 2017-12-30 MED ORDER — ACETAMINOPHEN 650 MG RE SUPP: 650.0000 mg | Freq: Four times a day (QID) | RECTAL | Status: DC | PRN

## 2017-12-30 MED ORDER — ONDANSETRON HCL 4 MG PO TABS: 4.0000 mg | ORAL_TABLET | Freq: Four times a day (QID) | ORAL | Status: DC | PRN

## 2017-12-30 MED ORDER — ONDANSETRON HCL 4 MG/2ML IJ SOLN: 4.0000 mg | Freq: Four times a day (QID) | INTRAMUSCULAR | Status: DC | PRN

## 2017-12-30 MED ORDER — SODIUM CHLORIDE 0.9 % IV BOLUS
1000.0000 mL | Freq: Once | INTRAVENOUS | Status: AC
  Administered 2017-12-30: 1000 mL via INTRAVENOUS

## 2017-12-30 MED ORDER — BISACODYL 10 MG RE SUPP
10.0000 mg | Freq: Once | RECTAL | Status: AC
  Administered 2017-12-30: 10 mg via RECTAL
  Filled 2017-12-30: qty 1

## 2017-12-30 MED ORDER — MORPHINE SULFATE (PF) 4 MG/ML IV SOLN
4.0000 mg | Freq: Once | INTRAVENOUS | Status: AC
  Administered 2017-12-30: 4 mg via INTRAVENOUS
  Filled 2017-12-30: qty 1

## 2017-12-30 MED ORDER — HYDROMORPHONE HCL 1 MG/ML IJ SOLN: 0.5000 mg | INTRAMUSCULAR | Status: DC | PRN

## 2017-12-30 MED ORDER — CIPROFLOXACIN IN D5W 400 MG/200ML IV SOLN
400.0000 mg | Freq: Once | INTRAVENOUS | Status: AC
  Administered 2017-12-30: 400 mg via INTRAVENOUS
  Filled 2017-12-30: qty 200

## 2017-12-30 MED ORDER — ACETAMINOPHEN 325 MG PO TABS: 650.0000 mg | ORAL_TABLET | Freq: Four times a day (QID) | ORAL | Status: DC | PRN

## 2017-12-30 MED ORDER — SODIUM CHLORIDE 0.9 % IV SOLN
1.0000 g | Freq: Three times a day (TID) | INTRAVENOUS | Status: DC
  Administered 2017-12-31 – 2018-01-01 (×4): 1 g via INTRAVENOUS
  Filled 2017-12-30 (×6): qty 1

## 2017-12-30 MED ORDER — MAGNESIUM CITRATE PO SOLN: 1.0000 | Freq: Once | ORAL | Status: DC | PRN

## 2017-12-30 MED ORDER — ENOXAPARIN SODIUM 40 MG/0.4ML ~~LOC~~ SOLN
40.0000 mg | SUBCUTANEOUS | Status: DC
  Administered 2017-12-30 – 2017-12-31 (×2): 40 mg via SUBCUTANEOUS
  Filled 2017-12-30 (×2): qty 0.4

## 2017-12-30 MED ORDER — SODIUM CHLORIDE 0.9 % IV SOLN
INTRAVENOUS | Status: DC
  Administered 2017-12-30 – 2017-12-31 (×3): via INTRAVENOUS

## 2017-12-30 MED ORDER — POLYETHYLENE GLYCOL 3350 17 G PO PACK: 17.0000 g | PACK | Freq: Every day | ORAL | Status: DC | PRN

## 2017-12-30 MED ORDER — ONDANSETRON HCL 4 MG/2ML IJ SOLN
4.0000 mg | Freq: Once | INTRAMUSCULAR | Status: AC
  Administered 2017-12-30: 4 mg via INTRAVENOUS
  Filled 2017-12-30: qty 2

## 2017-12-30 MED ORDER — OXYCODONE HCL 5 MG PO TABS: 5.0000 mg | ORAL_TABLET | ORAL | Status: DC | PRN

## 2017-12-30 MED ORDER — AZTREONAM 1 G IJ SOLR: 1.0000 g | Freq: Three times a day (TID) | INTRAMUSCULAR | Status: DC

## 2017-12-30 MED ORDER — LOSARTAN POTASSIUM 50 MG PO TABS
100.0000 mg | ORAL_TABLET | Freq: Every day | ORAL | Status: DC
  Administered 2017-12-30 – 2018-01-01 (×3): 100 mg via ORAL
  Filled 2017-12-30 (×3): qty 2

## 2017-12-30 NOTE — ED Triage Notes (Signed)
Pt from home with LLQ pain. Claims no BM for 3 weeks. Has not eaten since Wednesday.

## 2017-12-30 NOTE — ED Notes (Signed)
ED TO INPATIENT HANDOFF REPORT  Name/Age/Gender Jorge [name] 84 y.o. male  Code Status Code Status History    Date Active Date Inactive Code Status Order ID Comments User Context   09/16/2017 1811 09/23/2017 1540 Full Code [id number]  Armandina Gemma, MD ED   06/02/2011 2228 06/07/2011 1850 Full Code [id number]  Hitt, Tobie Lords, RN Inpatient      Home/SNF/Other Home  Chief Complaint abd pain  Level of Care/Admitting Diagnosis ED Disposition    ED Disposition Condition [place]: [facility] [[id number]]  Level of Care: Med-Surg [16]  Diagnosis: Hydronephrosis of left kidney [387909]  Admitting Physician: Cristy Folks [[id number]]  Attending Physician: Cristy Folks [[id number]]  Estimated length of stay: past midnight tomorrow  Certification:: I certify this patient will need inpatient services for at least 2 midnights  PT Class (Do Not Modify): Inpatient [101]  PT Acc Code (Do Not Modify): Private [1]       Medical History Past Medical History:  Diagnosis Date  . Anxiety   . ED (erectile dysfunction)   . Gastric ulcer    Dr. Ardis Hughs  . GERD (gastroesophageal reflux disease)   . Hernia, hiatal   . History of colonic polyps   . Hx of radiation therapy   . Hydronephrosis of left kidney 12/30/2017  . Hypertension   . Insomnia   . Prostate cancer [facility]) 2010   Dr. Luberta Robertson  . Prostate cancer metastatic to bone ([place]) 12/30/2017  . S/P prostatectomy 12/30/2017  . Syncope 2011   from Bicalutamide  . Vitamin D deficiency     Allergies Allergies  Allergen Reactions  . Aspirin Other (See Comments)    Ulcerated stomach  . Azithromycin Shortness Of Breath  . Cefuroxime Axetil Shortness Of Breath  . Iodinated Diagnostic Agents Anaphylaxis  . Iodine Other (See Comments)    Per pt tremors and dyspnea  . Other Shortness Of Breath    Twist in esophagus so patient can not take large pills/capsules  . Naproxen Other (See Comments)   REACTION: upset stomach - like with other NSAIDs    IV Location/Drains/Wounds Patient Lines/Drains/Airways Status   Active Line/Drains/Airways    Name:   Placement date:   Placement time:   Site:   Days:   Peripheral IV 12/30/17 Right Antecubital   12/30/17    1252    Antecubital   less than 1   Gastrostomy/Enterostomy Gastrostomy 22 Fr. LUQ   09/20/17    1430    LUQ   101   Incision (Closed) 09/20/17 Abdomen   09/20/17    1448     101          Labs/Imaging Results for orders placed or performed during the hospital encounter of 12/30/17 (from the past 48 hour(s))  CBC with Differential     Status: Abnormal   Collection Time: 12/30/17 12:54 PM  Result Value Ref Range   WBC 14.2 (H) 4.0 - 10.5 K/uL   RBC 4.53 4.22 - 5.81 MIL/uL   Hemoglobin 14.6 13.0 - 17.0 g/dL   HCT 43.6 39.0 - 52.0 %   MCV 96.2 78.0 - 100.0 fL   MCH 32.2 26.0 - 34.0 pg   MCHC 33.5 30.0 - 36.0 g/dL   RDW 13.3 11.5 - 15.5 %   Platelets 242 150 - 400 K/uL   Neutrophils Relative % 82 %   Neutro Abs 11.7 (H) 1.7 - 7.7 K/uL   Lymphocytes Relative  8 %   Lymphs Abs 1.1 0.7 - 4.0 K/uL   Monocytes Relative 10 %   Monocytes Absolute 1.4 (H) 0.1 - 1.0 K/uL   Eosinophils Relative 0 %   Eosinophils Absolute 0.0 0.0 - 0.7 K/uL   Basophils Relative 0 %   Basophils Absolute 0.0 0.0 - 0.1 K/uL    Comment: Performed at [facility], [place] [address] [place], [place] [address]  Comprehensive metabolic panel     Status: Abnormal   Collection Time: 12/30/17 12:54 PM  Result Value Ref Range   Sodium 142 135 - 145 mmol/L   Potassium 3.9 3.5 - 5.1 mmol/L   Chloride 107 98 - 111 mmol/L   CO2 24 22 - 32 mmol/L   Glucose, Bld 101 (H) 70 - 99 mg/dL   BUN 18 8 - 23 mg/dL   Creatinine, Ser 1.04 0.61 - 1.24 mg/dL   Calcium 9.5 8.9 - 10.3 mg/dL   Total Protein 7.6 6.5 - 8.1 g/dL   Albumin 3.7 3.5 - 5.0 g/dL   AST 27 15 - 41 U/L   ALT 12 0 - 44 U/L   Alkaline Phosphatase 199 (H) 38 - 126 U/L   Total Bilirubin  0.6 0.3 - 1.2 mg/dL   GFR calc non Af Amer >60 >60 mL/min   GFR calc Af Amer >60 >60 mL/min    Comment: (NOTE) The eGFR has been calculated using the CKD EPI equation. This calculation has not been validated in all clinical situations. eGFR's persistently <60 mL/min signify possible Chronic Kidney Disease.    Anion gap 11 5 - 15    Comment: Performed at [facility], [place] [address] [place], [place] [address]  Lipase, blood     Status: None   Collection Time: 12/30/17 12:54 PM  Result Value Ref Range   Lipase 25 11 - 51 U/L    Comment: Performed at [facility], [place] [address] [place], [place] [address]  Urinalysis, Routine w reflex microscopic     Status: Abnormal   Collection Time: 12/30/17 12:54 PM  Result Value Ref Range   Color, Urine YELLOW YELLOW   APPearance CLEAR CLEAR   Specific Gravity, Urine 1.009 1.005 - 1.030   pH 8.0 5.0 - 8.0   Glucose, UA NEGATIVE NEGATIVE mg/dL   Hgb urine dipstick NEGATIVE NEGATIVE   Bilirubin Urine NEGATIVE NEGATIVE   Ketones, ur 5 (A) NEGATIVE mg/dL   Protein, ur NEGATIVE NEGATIVE mg/dL   Nitrite NEGATIVE NEGATIVE   Leukocytes, UA NEGATIVE NEGATIVE    Comment: Performed at [place] [address], [place], [place] [address]  POC occult blood, ED Provider will collect     Status: None   Collection Time: 12/30/17  2:06 PM  Result Value Ref Range   Fecal Occult Bld NEGATIVE NEGATIVE   Ct Abdomen Pelvis Wo Contrast  Result Date: 12/30/2017 CLINICAL DATA:  Acute left-sided abdominal pain. History of prostate cancer. EXAM: CT ABDOMEN AND PELVIS WITHOUT CONTRAST TECHNIQUE: Multidetector CT imaging of the abdomen and pelvis was performed following the standard protocol without IV contrast. COMPARISON:  CT scan of December 28, 2017. FINDINGS: Lower chest: Stable left basilar atelectasis is noted. Stable large hiatal hernia is noted which contains a portion of transverse colon, but does  not result in incarceration or obstruction. Hepatobiliary: No focal liver abnormality is seen. No gallstones, gallbladder wall thickening, or biliary dilatation. Pancreas: Unremarkable. No pancreatic ductal dilatation or surrounding inflammatory changes.  Spleen: Normal in size without focal abnormality. Adrenals/Urinary Tract: Adrenal glands appear normal. Right kidney and ureter are unremarkable. Urinary bladder is unremarkable. Moderate left hydronephrosis is noted with moderate dilatation of the left renal pelvis and proximal left ureter with surrounding inflammation suggesting urinary tract infection. The middle and distal portions of the left ureter are normal in caliber and there is no evidence of obstructing calculus. Therefore, this most likely is due to ureteral stricture or scarring. Stomach/Bowel: There is no evidence of bowel obstruction. Diverticulosis of the colon is noted without inflammation. The appendix is not visualized. Vascular/Lymphatic: Aortic atherosclerosis. No enlarged abdominal or pelvic lymph nodes. Reproductive: Status post prostatectomy. Other: No abdominal wall hernia or abnormality. No abdominopelvic ascites. Musculoskeletal: Stable sclerotic lesions are seen involving both acetabuli, left greater than right. Stable sclerotic densities are noted in L3 consistent with metastatic disease. IMPRESSION: Moderate left hydronephrosis is noted with moderate dilatation of the left renal pelvis and proximal left ureter without obstructing calculus. This most likely is due to ureteral stricture or scarring. There is noted increased inflammatory changes around the left renal pelvis and proximal left ureter suggesting urinary tract infection. Stable large hiatal hernia is noted which also contains a portion of transverse colon, but does not result in incarceration or obstruction. Diverticulosis of the colon is noted without inflammation. Status post prostatectomy. Stable sclerotic lesions are  noted in the pelvis and L3 vertebral body concerning for metastatic disease. Aortic Atherosclerosis (ICD10-I70.0). Electronically Signed   By: Marijo Conception, M.D.   On: 12/30/2017 14:21    Pending Labs [employer] (From admission, onward)    Start     Ordered   Signed and Occupational hygienist morning,   R     Signed and Held   Signed and Held  CBC  Tomorrow morning,   R     Signed and Held          Vitals/Pain Today's Vitals   12/30/17 1430 12/30/17 1435 12/30/17 1518 12/30/17 1530  BP: (!) 193/105  (!) 188/98   Pulse: 98  (!) 101   Resp: (!) 25  (!) 21   Temp:      TempSrc:      SpO2: 93%  93%   Weight:      Height:      PainSc:  4   10-Worst pain ever    Isolation Precautions No active isolations  Medications Medications  ciprofloxacin (CIPRO) IVPB 400 mg (400 mg Intravenous New Bag/Given 12/30/17 1544)  ondansetron (ZOFRAN) injection 4 mg (4 mg Intravenous Given 12/30/17 1255)  sodium chloride 0.9 % bolus 1,000 mL (0 mLs Intravenous Stopped 12/30/17 1430)  morphine 4 MG/ML injection 4 mg (4 mg Intravenous Given 12/30/17 1255)  morphine 4 MG/ML injection 4 mg (4 mg Intravenous Given 12/30/17 1529)    Mobility non-ambulatory at this time

## 2017-12-30 NOTE — ED Notes (Signed)
Patient states he had milkshake last night.

## 2017-12-30 NOTE — H&P (Addendum)
History and Physical    [name] CHE:[id number] DOB: [birth year] DOA: 12/30/2017  PCP: Cassandria Anger, MD   Patient coming from: home    Chief Complaint: LLQ abdominal pain  HPI: Jorge [name] is a 84 y.o. male with medical history significant of prostate cancer status post prostatectomy in 5361 complicated by bone metastases on leuprolide, hypertension, multiple hernias with revisions who presents with left lower quadrant abdominal pain and found to have left-sided hydronephrosis.  Patient reports he is feeling fairly well until approximately 3 days ago when he began to develop left lower quadrant abdominal pain.  Patient describes the pain as sharp and nonradiating.  Pain was constant.  Patient was able to eat but had decreased appetite.  Patient reports that eating did not improve or worsen the pain.  He did not have any diarrhea or constipation.  He has had dry heaves and some nausea but no clear emesis.  Patient did not have any fevers, dysuria, change in urine color, groin pain.  Patient initially presented to the ED 2 days ago for the abdominal pain or labs were unremarkable and patient was sent home.  ED Course: In the ED patient was noted to be mildly tachycardic and hypertensive.  Patient was afebrile.  White count was notable at 14.2.  UA just showed ketonuria.  CT abdomen and pelvis showed moderate left-sided hydronephrosis with no obstructing calculus and evidence of inflammatory changes on the left renal pelvis concerning for UTI.  Patient was noted to also have large hiatal hernia.  Review of Systems: As per HPI otherwise 10 point review of systems negative.   Past Medical History:  Diagnosis Date  . Anxiety   . ED (erectile dysfunction)   . Gastric ulcer    Dr. Ardis Hughs  . GERD (gastroesophageal reflux disease)   . Hernia, hiatal   . History of colonic polyps   . Hx of radiation therapy   . Hydronephrosis of left kidney 12/30/2017  . Hypertension   . Insomnia    . Prostate cancer [facility]) 2010   Dr. Luberta Robertson  . Prostate cancer metastatic to bone ([place]) 12/30/2017  . S/P prostatectomy 12/30/2017  . Syncope 2011   from Bicalutamide  . Vitamin D deficiency     Past Surgical History:  Procedure Laterality Date  . APPENDECTOMY     age 20  . CYST REMOVAL NECK N/A 10/16/2014   Procedure: EXCISION CYST POSTERIOR NECK;  Surgeon: Autumn Messing III, MD;  Location: [place];  Service: General;  Laterality: N/A;  posterior  . ESOPHAGOGASTRODUODENOSCOPY  06/05/2011   Procedure: ESOPHAGOGASTRODUODENOSCOPY (EGD);  Surgeon: Gatha Mayer, MD;  Location[facility] ENDOSCOPY;  Service: Endoscopy;  Laterality: N/A;  . ESOPHAGOGASTRODUODENOSCOPY N/A 09/16/2017   Procedure: ESOPHAGOGASTRODUODENOSCOPY (EGD);  Surgeon: Carol Ada, MD;  Location: [facility] ENDOS[facility];  Service: Endoscopy;  Laterality: N/A;  . GASTROSTOMY N/A 09/20/2017   Procedure: INSERTION OF GASTROSTOMY TUBE;  Surgeon: Stark Klein, MD;  Location: [facility];  Service: General;  Laterality: N/A;  . HERNIA REPAIR  06/10/2011      . HIATAL HERNIA REPAIR  06/10/2011   Procedure: LAPAROSCOPIC REPAIR OF HIATAL HERNIA;  Surgeon: Pedro Earls, MD;  Location: [place];  Service: General;  Laterality: N/A;  Laparoscopic repair of paraesophageal hernia.  [name] PROSTATECTOMY  2008  . TRANSURETHRAL RESECTION OF PROSTATE       reports that he has quit smoking. He has never used smokeless tobacco. He reports that he does not drink  alcohol or use drugs.  Allergies  Allergen Reactions  . Aspirin Other (See Comments)    Ulcerated stomach  . Azithromycin Shortness Of Breath  . Cefuroxime Axetil Shortness Of Breath  . Iodinated Diagnostic Agents Anaphylaxis  . Iodine Other (See Comments)    Per pt tremors and dyspnea  . Other Shortness Of Breath    Twist in esophagus so patient can not take large pills/capsules  . Naproxen Other (See Comments)    REACTION: upset stomach - like with other NSAIDs    Family History    Problem Relation Age of Onset  . Pancreatic cancer Father   . Hypertension Father   . Cancer Father   . Hypertension Other    Unacceptable: Noncontributory, unremarkable, or negative. Acceptable: Family history reviewed and not pertinent (If you reviewed it)  Prior to Admission medications   Medication Sig Start Date End Date Taking? Authorizing Provider  Docusate Calcium (STOOL SOFTENER PO) Take 1 tablet by mouth at bedtime.    Yes [provider]  losartan (COZAAR) 100 MG tablet Take 1 tablet (100 mg total) by mouth daily. 04/05/17  Yes [name], [name], MD  traMADol (ULTRAM) 50 MG tablet Take 1-2 tablets (50-100 mg total) by mouth every 6 (six) hours as needed for moderate pain or severe pain (pain not relieved by Tylenol). 12/28/17  Yes Davonna Belling, MD  triamcinolone cream (KENALOG) 0.5 % Apply 1 application topically 4 (four) times daily. 12/22/17 12/22/18 Yes [name], [name], MD    Physical Exam: Vitals:   12/30/17 1141 12/30/17 1143 12/30/17 1430 12/30/17 1518  BP: (!) 178/109  (!) 193/105 (!) 188/98  Pulse: 79  98 (!) 101  Resp: (!) 24  (!) 25 (!) 21  Temp: 99 F (37.2 C)     TempSrc: Oral     SpO2: 97%  93% 93%  Weight:  93.4 kg    Height:  6' 1.5" (1.867 m)      Constitutional: NAD, calm, comfortable Vitals:   12/30/17 1141 12/30/17 1143 12/30/17 1430 12/30/17 1518  BP: (!) 178/109  (!) 193/105 (!) 188/98  Pulse: 79  98 (!) 101  Resp: (!) 24  (!) 25 (!) 21  Temp: 99 F (37.2 C)     TempSrc: Oral     SpO2: 97%  93% 93%  Weight:  93.4 kg    Height:  6' 1.5" (1.867 m)     Eyes: Anicteric sclera ENMT: Dry mucous membranes, edentulous Neck: normal, supple Respiratory: clear to auscultation bilaterally, no wheezing, no crackles. Normal respiratory effort. No accessory muscle use.  Cardiovascular: Regular rate and rhythm, no murmurs Abdomen: Soft, left lower quadrant tenderness, no rebound or guarding, plus bowel sounds.  Musculoskeletal:  Trace lower extremity edema Skin: Bilateral well-healed incisions over upper and lower abdomen at the center Neurologic: Grossly intact, moving all extremities Psychiatric: Normal judgment and insight. Alert and oriented x 3. Normal mood.    Labs on Admission: I have personally reviewed following labs and imaging studies  CBC: Recent Labs  Lab 12/28/17 0745 12/30/17 1254  WBC 9.8 14.2*  NEUTROABS  --  11.7*  HGB 14.1 14.6  HCT 42.0 43.6  MCV 96.8 96.2  PLT 222 330   Basic Metabolic Panel: Recent Labs  Lab 12/28/17 0745 12/30/17 1254  NA 140 142  K 4.3 3.9  CL 108 107  CO2 23 24  GLUCOSE 128* 101*  BUN 12 18  CREATININE 1.08 1.04  CALCIUM 9.3 9.5  GFR: Estimated Creatinine Clearance: 58.5 mL/min (by C-G formula based on SCr of 1.04 mg/dL). Liver Function Tests: Recent Labs  Lab 12/28/17 0745 12/30/17 1254  AST 26 27  ALT 14 12  ALKPHOS 223* 199*  BILITOT 0.7 0.6  PROT 6.6 7.6  ALBUMIN 3.6 3.7   Recent Labs  Lab 12/28/17 0745 12/30/17 1254  LIPASE 26 25   No results for input(s): AMMONIA in the last 168 hours. Coagulation Profile: No results for input(s): INR, PROTIME in the last 168 hours. Cardiac Enzymes: No results for input(s): CKTOTAL, CKMB, CKMBINDEX, TROPONINI in the last 168 hours. BNP (last 3 results) No results for input(s): PROBNP in the last 8760 hours. HbA1C: No results for input(s): HGBA1C in the last 72 hours. CBG: No results for input(s): GLUCAP in the last 168 hours. Lipid Profile: No results for input(s): CHOL, HDL, LDLCALC, TRIG, CHOLHDL, LDLDIRECT in the last 72 hours. Thyroid Function Tests: No results for input(s): TSH, T4TOTAL, FREET4, T3FREE, THYROIDAB in the last 72 hours. Anemia Panel: No results for input(s): VITAMINB12, FOLATE, FERRITIN, TIBC, IRON, RETICCTPCT in the last 72 hours. Urine analysis:    Component Value Date/Time   COLORURINE YELLOW 12/30/2017 1254   APPEARANCEUR CLEAR 12/30/2017 1254   LABSPEC 1.009  12/30/2017 1254   PHURINE 8.0 12/30/2017 1254   GLUCOSEU NEGATIVE 12/30/2017 1254   GLUCOSEU NEGATIVE 11/19/2009 0802   HGBUR NEGATIVE 12/30/2017 1254   BILIRUBINUR NEGATIVE 12/30/2017 1254   KETONESUR 5 (A) 12/30/2017 1254   PROTEINUR NEGATIVE 12/30/2017 1254   UROBILINOGEN 0.2 11/19/2009 0802   NITRITE NEGATIVE 12/30/2017 1254   LEUKOCYTESUR NEGATIVE 12/30/2017 1254    Radiological Exams on Admission: Ct Abdomen Pelvis Wo Contrast  Result Date: 12/30/2017 CLINICAL DATA:  Acute left-sided abdominal pain. History of prostate cancer. EXAM: CT ABDOMEN AND PELVIS WITHOUT CONTRAST TECHNIQUE: Multidetector CT imaging of the abdomen and pelvis was performed following the standard protocol without IV contrast. COMPARISON:  CT scan of December 28, 2017. FINDINGS: Lower chest: Stable left basilar atelectasis is noted. Stable large hiatal hernia is noted which contains a portion of transverse colon, but does not result in incarceration or obstruction. Hepatobiliary: No focal liver abnormality is seen. No gallstones, gallbladder wall thickening, or biliary dilatation. Pancreas: Unremarkable. No pancreatic ductal dilatation or surrounding inflammatory changes. Spleen: Normal in size without focal abnormality. Adrenals/Urinary Tract: Adrenal glands appear normal. Right kidney and ureter are unremarkable. Urinary bladder is unremarkable. Moderate left hydronephrosis is noted with moderate dilatation of the left renal pelvis and proximal left ureter with surrounding inflammation suggesting urinary tract infection. The middle and distal portions of the left ureter are normal in caliber and there is no evidence of obstructing calculus. Therefore, this most likely is due to ureteral stricture or scarring. Stomach/Bowel: There is no evidence of bowel obstruction. Diverticulosis of the colon is noted without inflammation. The appendix is not visualized. Vascular/Lymphatic: Aortic atherosclerosis. No enlarged abdominal  or pelvic lymph nodes. Reproductive: Status post prostatectomy. Other: No abdominal wall hernia or abnormality. No abdominopelvic ascites. Musculoskeletal: Stable sclerotic lesions are seen involving both acetabuli, left greater than right. Stable sclerotic densities are noted in L3 consistent with metastatic disease. IMPRESSION: Moderate left hydronephrosis is noted with moderate dilatation of the left renal pelvis and proximal left ureter without obstructing calculus. This most likely is due to ureteral stricture or scarring. There is noted increased inflammatory changes around the left renal pelvis and proximal left ureter suggesting urinary tract infection. Stable large hiatal hernia is noted which  also contains a portion of transverse colon, but does not result in incarceration or obstruction. Diverticulosis of the colon is noted without inflammation. Status post prostatectomy. Stable sclerotic lesions are noted in the pelvis and L3 vertebral body concerning for metastatic disease. Aortic Atherosclerosis (ICD10-I70.0). Electronically Signed   By: Marijo Conception, M.D.   On: 12/30/2017 14:21    EKG: Independently reviewed.  None performed  Assessment/Plan Active Problems:   HTN (hypertension)   LUQ abdominal pain   Large hiatal hernia   Prostate cancer (HCC)   S/P prostatectomy   Prostate cancer metastatic to bone (HCC)   Hydronephrosis of left kidney    #) Left-sided hydronephrosis complicated by possible pyelonephritis: Unclear etiology.  Have low suspicion for metastatic prostate cancer at this time.  At this time it is not clear that he has pyelonephritis though he does have some inflammatory changes on the CT scan as well as an elevated white count.  Suspect this most likely reactionary rather than a true infection as his UA is incredibly bland.  However he is at high risk for complications. - Follow-up urine cultures on 12/30/2017 -Continue IV aztreonam started 12/22/2017 -Urology  consulted, will evaluate patient -N.p.o. at midnight -IV fluids  -IV pain control  #) Hypertension: - Continue losartan 100 mg daily  #) Metastatic prostate cancer: Patient apparently had prostatectomy in 2008.  #) Recurrent abdominal hernias: Currently no evidence of obstruction  Fluids: Gentle IV fluids Electrodes: Monitor and supplement Nutrition: Heart healthy diet  Prophylaxis: Enoxaparin  Disposition: Pending evaluation by urology  Full code    Cristy Folks MD [facility]  If 7PM-7AM, please contact night-coverage [email or web address] Password [facility]  12/30/2017, 3:28 PM

## 2017-12-30 NOTE — ED Notes (Signed)
Pt given ginger ale.

## 2017-12-30 NOTE — ED Provider Notes (Addendum)
[facility] DEPT Provider Note   CSN: [id number] Arrival date & time: 12/30/17  1130     History   Chief Complaint Chief Complaint  Patient presents with  . Abdominal Pain    HPI Jorge [name] is a 84 y.o. male.  Left lower quadrant pain for several days.  Patient was evaluated in the emergency department 2 days ago for similar complaints and sent home.  No fever, sweats, chills, dysuria, hematuria.  Patient has not had a good bowel movement in 7 days per his history.  Status post TURP in 2008.  Status post prostate cancer diagnosed in 2010 with radiation treatments (x40) and currently on Lupron injections every 4 months.  Severity of pain is moderate.  Palpation makes pain worse     Past Medical History:  Diagnosis Date  . Anxiety   . ED (erectile dysfunction)   . Gastric ulcer    Dr. Ardis Hughs  . GERD (gastroesophageal reflux disease)   . Hernia, hiatal   . History of colonic polyps   . Hx of radiation therapy   . Hypertension   . Insomnia   . Prostate cancer [facility]) 2010   Dr. Luberta Robertson  . Syncope 2011   from Bicalutamide  . Vitamin D deficiency     Patient Active Problem List   Diagnosis Date Noted  . Small bowel obstruction ([place]) 09/16/2017  . Osteoarthritis 09/12/2017  . Hot flash due to medication 04/05/2017  . Squamous cell skin cancer 11/15/2016  . Prostate cancer ([place]) 02/17/2016  . Elbow pain 10/21/2015  . Insomnia 08/18/2014  . Sebaceous cyst 08/18/2014  . Cerumen impaction 07/23/2013  . Eczema 08/23/2012  . Pain in joint, shoulder region 08/23/2012  . LBP (low back pain) 05/21/2012  . Actinic keratoses 10/26/2011  . Contact dermatitis 09/22/2011  . Large hiatal hernia 07/13/2011  . Nonspecific (abnormal) findings on radiological and other examination of gastrointestinal tract 06/05/2011  . LUQ abdominal pain 04/22/2011  . Constipation 04/22/2011  . Chest pain, atypical 04/22/2011  . Irreducible hiatal hernia  04/22/2011  . Neoplasm of uncertain behavior of skin 11/10/2010  . HIP PAIN 04/28/2010  . Diarrhea 10/07/2008  . ERECTILE DYSFUNCTION 08/22/2008  . PERSONAL HX COLON CANCER 11/06/2007  . INSOMNIA, PERSISTENT 08/17/2007  . Vitamin D deficiency 04/19/2007  . Anxiety state 04/19/2007  . Essential hypertension 04/19/2007  . GERD 04/19/2007  . PEPTIC ULCER DISEASE 04/19/2007  . CERVICAL STRAIN 04/19/2007  . COLONIC POLYPS, HX OF 04/19/2007    Past Surgical History:  Procedure Laterality Date  . APPENDECTOMY     age 46  . CYST REMOVAL NECK N/A 10/16/2014   Procedure: EXCISION CYST POSTERIOR NECK;  Surgeon: Autumn Messing III, MD;  Location: [facility];  Service: General;  Laterality: N/A;  posterior  . ESOPHAGOGASTRODUODENOSCOPY  06/05/2011   Procedure: ESOPHAGOGASTRODUODENOSCOPY (EGD);  Surgeon: Gatha Mayer, MD;  Location: [facility] ENDOSCOPY;  Service: Endoscopy;  Laterality: N/A;  . ESOPHAGOGASTRODUODENOSCOPY N/A 09/16/2017   Procedure: ESOPHAGOGASTRODUODENOSCOPY (EGD);  Surgeon: Carol Ada, MD;  Location: [facility];  Service: Endoscopy;  Laterality: N/A;  . GASTROSTOMY N/A 09/20/2017   Procedure: INSERTION OF GASTROSTOMY TUBE;  Surgeon: Stark Klein, MD;  Location: [facility];  Service: General;  Laterality: N/A;  . HERNIA REPAIR  06/10/2011      . HIATAL HERNIA REPAIR  06/10/2011   Procedure: LAPAROSCOPIC REPAIR OF HIATAL HERNIA;  Surgeon: Pedro Earls, MD;  Location: [place];  Service: General;  Laterality: N/A;  Laparoscopic repair of paraesophageal hernia.  [name] PROSTATECTOMY  2008  . TRANSURETHRAL RESECTION OF PROSTATE          Home Medications    Prior to Admission medications   Medication Sig Start Date End Date Taking? Authorizing Provider  Docusate Calcium (STOOL SOFTENER PO) Take 1 tablet by mouth at bedtime.    Yes [provider]  losartan (COZAAR) 100 MG tablet Take 1 tablet (100 mg total) by mouth daily. 04/05/17  Yes [name], [name], MD  traMADol  (ULTRAM) 50 MG tablet Take 1-2 tablets (50-100 mg total) by mouth every 6 (six) hours as needed for moderate pain or severe pain (pain not relieved by Tylenol). 12/28/17  Yes Davonna Belling, MD  triamcinolone cream (KENALOG) 0.5 % Apply 1 application topically 4 (four) times daily. 12/22/17 12/22/18 Yes [name], [name], MD    Family History Family History  Problem Relation Age of Onset  . Pancreatic cancer Father   . Hypertension Father   . Cancer Father   . Hypertension Other     Social History Social History   Tobacco Use  . Smoking status: Former Research scientist (life sciences)  . Smokeless tobacco: Never Used  Substance Use Topics  . Alcohol use: No    Comment: social  . Drug use: No     Allergies   Aspirin; Azithromycin; Cefuroxime axetil; Iodinated diagnostic agents; Iodine; Other; and Naproxen   Review of Systems Review of Systems  All other systems reviewed and are negative.    Physical Exam Updated Vital Signs BP (!) 193/105   Pulse 98   Temp 99 F (37.2 C) (Oral)   Resp (!) 25   Ht 6' 1.5" (1.867 m)   Wt 93.4 kg   SpO2 93%   BMI 26.81 kg/m   Physical Exam  Constitutional: He is oriented to person, place, and time. He appears well-developed and well-nourished.  HENT:  Head: Normocephalic and atraumatic.  Eyes: Conjunctivae are normal.  Neck: Neck supple.  Cardiovascular: Normal rate and regular rhythm.  Pulmonary/Chest: Effort normal and breath sounds normal.  Abdominal: Soft. Bowel sounds are normal.  Tender left lower quadrant.  Genitourinary:  Genitourinary Comments: Tender left flank.  Musculoskeletal: Normal range of motion.  Neurological: He is alert and oriented to person, place, and time.  Skin: Skin is warm and dry.  Psychiatric: He has a normal mood and affect. His behavior is normal.  Nursing note and vitals reviewed.    ED Treatments / Results  Labs (all labs ordered are listed, but only abnormal results are displayed) Labs Reviewed  CBC WITH  DIFFERENTIAL/PLATELET - Abnormal; Notable for the following components:      Result Value   WBC 14.2 (*)    Neutro Abs 11.7 (*)    Monocytes Absolute 1.4 (*)    All other components within normal limits  COMPREHENSIVE METABOLIC PANEL - Abnormal; Notable for the following components:   Glucose, Bld 101 (*)    Alkaline Phosphatase 199 (*)    All other components within normal limits  URINALYSIS, ROUTINE W REFLEX MICROSCOPIC - Abnormal; Notable for the following components:   Ketones, ur 5 (*)    All other components within normal limits  LIPASE, BLOOD  POC OCCULT BLOOD, ED    EKG None  Radiology Ct Abdomen Pelvis Wo Contrast  Result Date: 12/30/2017 CLINICAL DATA:  Acute left-sided abdominal pain. History of prostate cancer. EXAM: CT ABDOMEN AND PELVIS WITHOUT CONTRAST TECHNIQUE: Multidetector CT imaging of the abdomen and pelvis  was performed following the standard protocol without IV contrast. COMPARISON:  CT scan of December 28, 2017. FINDINGS: Lower chest: Stable left basilar atelectasis is noted. Stable large hiatal hernia is noted which contains a portion of transverse colon, but does not result in incarceration or obstruction. Hepatobiliary: No focal liver abnormality is seen. No gallstones, gallbladder wall thickening, or biliary dilatation. Pancreas: Unremarkable. No pancreatic ductal dilatation or surrounding inflammatory changes. Spleen: Normal in size without focal abnormality. Adrenals/Urinary Tract: Adrenal glands appear normal. Right kidney and ureter are unremarkable. Urinary bladder is unremarkable. Moderate left hydronephrosis is noted with moderate dilatation of the left renal pelvis and proximal left ureter with surrounding inflammation suggesting urinary tract infection. The middle and distal portions of the left ureter are normal in caliber and there is no evidence of obstructing calculus. Therefore, this most likely is due to ureteral stricture or scarring. Stomach/Bowel:  There is no evidence of bowel obstruction. Diverticulosis of the colon is noted without inflammation. The appendix is not visualized. Vascular/Lymphatic: Aortic atherosclerosis. No enlarged abdominal or pelvic lymph nodes. Reproductive: Status post prostatectomy. Other: No abdominal wall hernia or abnormality. No abdominopelvic ascites. Musculoskeletal: Stable sclerotic lesions are seen involving both acetabuli, left greater than right. Stable sclerotic densities are noted in L3 consistent with metastatic disease. IMPRESSION: Moderate left hydronephrosis is noted with moderate dilatation of the left renal pelvis and proximal left ureter without obstructing calculus. This most likely is due to ureteral stricture or scarring. There is noted increased inflammatory changes around the left renal pelvis and proximal left ureter suggesting urinary tract infection. Stable large hiatal hernia is noted which also contains a portion of transverse colon, but does not result in incarceration or obstruction. Diverticulosis of the colon is noted without inflammation. Status post prostatectomy. Stable sclerotic lesions are noted in the pelvis and L3 vertebral body concerning for metastatic disease. Aortic Atherosclerosis (ICD10-I70.0). Electronically Signed   By: Marijo Conception, M.D.   On: 12/30/2017 14:21   Dg Abd 2 Views  Result Date: 12/28/2017 CLINICAL DATA:  Abdominal pain. EXAM: ABDOMEN - 2 VIEW COMPARISON:  CT 12/28/2017.  KUB 09/16/2017. FINDINGS: Soft tissue structures are unremarkable. Prominent hiatal hernia again noted without interim change. Slightly distended loops of bowel show most likely colon with scattered air-fluid levels noted. An active process such as a diarrheal illness/colitis cannot be excluded. Adynamic ileus may be present. Follow-up exam to exclude developing obstruction suggested. No free air. Degenerative change lumbar spine. Ankylosis lumbar spine consistent with ankylosing spondylitis.  Sclerotic changes noted about the left pubis and acetabulum, and right ilium. This could be from metastatic disease. This could be from a process such as Paget's disease. Reference made to prior CT report which suggests bony metastatic disease. No acute bony abnormality. IMPRESSION: 1. Slightly distended loops of bowel, most likely the colon. Scattered air-fluid levels noted. An active process such as a diarrheal illness/colitis cannot be excluded. Adynamic ileus may be present. Follow-up exam to exclude developing bowel obstruction suggested. No free air. Prominent hiatal hernia again noted without interim change. 2. Degenerative changes lumbar spine. Ankylosing spondylitis. Sclerotic changes left pubis and acetabulum, and right ilium. This could be from metastatic disease. This could be from a process such as Paget's disease. Reference made to CT report which suggest bony metastatic disease. Electronically Signed   By: Marcello Moores  Register   On: 12/28/2017 15:16    Procedures Procedures (including critical care time)  Medications Ordered in ED Medications  ondansetron (ZOFRAN) injection 4 mg (  4 mg Intravenous Given 12/30/17 1255)  sodium chloride 0.9 % bolus 1,000 mL (0 mLs Intravenous Stopped 12/30/17 1430)  morphine 4 MG/ML injection 4 mg (4 mg Intravenous Given 12/30/17 1255)     Initial Impression / Assessment and Plan / ED Course  I have reviewed the triage vital signs and the nursing notes.  Pertinent labs & imaging results that were available during my care of the patient were reviewed by me and considered in my medical decision making (see chart for details).     Patient presents with persistent left lower quadrant pain.  White count today is elevated to 14.2.  CT scan reveals left hydronephrosis and proximal hydroureter.  These findings were discussed with urologist.  Admit to general medicine.  IV fluids and pain management given in the ED.   1520: Discussed with hospitalist.  Will start  Cipro in lieu of Rocephin secondary to his allergy profile.  CRITICAL CARE Performed by: Nat Christen Total critical care time: 30 minutes Critical care time was exclusive of separately billable procedures and treating other patients. Critical care was necessary to treat or prevent imminent or life-threatening deterioration. Critical care was time spent personally by me on the following activities: development of treatment plan with patient and/or surrogate as well as nursing, discussions with consultants, evaluation of patient's response to treatment, examination of patient, obtaining history from patient or surrogate, ordering and performing treatments and interventions, ordering and review of laboratory studies, ordering and review of radiographic studies, pulse oximetry and re-evaluation of patient's condition.  Final Clinical Impressions(s) / ED Diagnoses   Final diagnoses:  Hydronephrosis of left kidney    ED Discharge Orders    None       Nat Christen, MD 12/30/17 [place], MD 12/30/17 1530

## 2017-12-30 NOTE — ED Notes (Signed)
Bed: WA01 Expected date:  Expected time:  Means of arrival:  Comments: 84 yo abd pain

## 2017-12-30 NOTE — Consult Note (Addendum)
H&P Physician requesting consult: Nat Christen, MD  Chief Complaint: Left hydronephrosis, left flank pain  History of Present Illness: 84 year old male with a history of prostate cancer.  He was originally diagnosed in 2008.  This was after a open simple prostatectomy.  He was found to have Gleason 8 prostate cancer.  He subsequently underwent radiation as well as 1 year of androgen deprivation therapy.  In October 2016, he started to show signs of metastatic disease as evidenced by CT scan with periaortic and obturator nodes.  Last CT scan was 09/19/2017 prior to this admission.  This did not reveal any hydronephrosis.  There was progressive left pelvic and retroperitoneal adenopathy as well as sclerotic bone metastasis.  This was new since January.  He presented to the emergency department on Thursday with severe left-sided flank pain.  He had a CT without contrast performed that revealed progressive left-sided hydronephrosis and proximal hydroureter down to the level of the common iliacs.  He was discharged home.  However, today, he experienced again severe left-sided flank pain.  He has not been able to sleep.  This prompted him to come to the emergency department where another CT scan was performed that showed moderate left hydronephrosis.  There was also noted to be some increased inflammatory change around the left renal pelvis and proximal ureter but suggest possible urinary tract infection.  Patient's white blood cell count was 14.2.  Creatinine was 1.04.  The patient continues to have some left-sided abdominal pain.  He has also not had a bowel movement in 1 week.  Currently afebrile.  Urinalysis was not consistent with infection and was negative.  He was started on ciprofloxacin for possible urinary tract infection in the setting of new hydronephrosis.  Past Medical History:  Diagnosis Date  . Anxiety   . ED (erectile dysfunction)   . Gastric ulcer    Dr. Ardis Hughs  . GERD (gastroesophageal reflux  disease)   . Hernia, hiatal   . History of colonic polyps   . Hx of radiation therapy   . Hydronephrosis of left kidney 12/30/2017  . Hypertension   . Insomnia   . Prostate cancer [facility]) 2010   Dr. Luberta Robertson  . Prostate cancer metastatic to bone ([place]) 12/30/2017  . S/P prostatectomy 12/30/2017  . Syncope 2011   from Bicalutamide  . Vitamin D deficiency    Past Surgical History:  Procedure Laterality Date  . APPENDECTOMY     age 3  . CYST REMOVAL NECK N/A 10/16/2014   Procedure: EXCISION CYST POSTERIOR NECK;  Surgeon: Autumn Messing III, MD;  Location: [place];  Service: General;  Laterality: N/A;  posterior  . ESOPHAGOGASTRODUODENOSCOPY  06/05/2011   Procedure: ESOPHAGOGASTRODUODENOSCOPY (EGD);  Surgeon: Gatha Mayer, MD;  Location[facility] ENDOSCOPY;  Service: Endoscopy;  Laterality: N/A;  . ESOPHAGOGASTRODUODENOSCOPY N/A 09/16/2017   Procedure: ESOPHAGOGASTRODUODENOSCOPY (EGD);  Surgeon: Carol Ada, MD;  Location[facility];  Service: Endoscopy;  Laterality: N/A;  . GASTROSTOMY N/A 09/20/2017   Procedure: INSERTION OF GASTROSTOMY TUBE;  Surgeon: Stark Klein, MD;  Location: [facility];  Service: General;  Laterality: N/A;  . HERNIA REPAIR  06/10/2011      . HIATAL HERNIA REPAIR  06/10/2011   Procedure: LAPAROSCOPIC REPAIR OF HIATAL HERNIA;  Surgeon: Pedro Earls, MD;  Location: [place];  Service: General;  Laterality: N/A;  Laparoscopic repair of paraesophageal hernia.  [name] PROSTATECTOMY  2008  . TRANSURETHRAL RESECTION OF PROSTATE      Home Medications:  Medications Prior to Admission  Medication Sig Dispense Refill Last Dose  . Docusate Calcium (STOOL SOFTENER PO) Take 1 tablet by mouth at bedtime.    Past Week at Unknown time  . losartan (COZAAR) 100 MG tablet Take 1 tablet (100 mg total) by mouth daily. 90 tablet 3 12/29/2017 at Unknown time  . traMADol (ULTRAM) 50 MG tablet Take 1-2 tablets (50-100 mg total) by mouth every 6 (six) hours as needed for moderate pain or  severe pain (pain not relieved by Tylenol). 20 tablet 0 12/30/2017 at Unknown time  . triamcinolone cream (KENALOG) 0.5 % Apply 1 application topically 4 (four) times daily. 60 g 3 12/29/2017 at Unknown time   Allergies:  Allergies  Allergen Reactions  . Aspirin Other (See Comments)    Ulcerated stomach  . Azithromycin Shortness Of Breath  . Cefuroxime Axetil Shortness Of Breath  . Iodinated Diagnostic Agents Anaphylaxis  . Iodine Other (See Comments)    Per pt tremors and dyspnea  . Other Shortness Of Breath    Twist in esophagus so patient can not take large pills/capsules  . Naproxen Other (See Comments)    REACTION: upset stomach - like with other NSAIDs    Family History  Problem Relation Age of Onset  . Pancreatic cancer Father   . Hypertension Father   . Cancer Father   . Hypertension Other    Social History:  reports that he has quit smoking. He has never used smokeless tobacco. He reports that he does not drink alcohol or use drugs.  ROS: A complete review of systems was performed.  All systems are negative except for pertinent findings as noted. ROS   Physical Exam:  Vital signs in last 24 hours: Temp:  [99 F (37.2 C)-99.6 F (37.6 C)] 99.6 F (37.6 C) (09/28 1706) Pulse Rate:  [79-118] 118 (09/28 1706) Resp:  [18-25] 18 (09/28 1706) BP: (146-193)/(92-109) 146/98 (09/28 1706) SpO2:  [93 %-97 %] 95 % (09/28 1706) Weight:  [93.4 kg] 93.4 kg (09/28 1143) General:  Alert and oriented, No acute distress HEENT: Normocephalic, atraumatic Neck: No JVD or lymphadenopathy Cardiovascular: Regular rate and rhythm Lungs: Regular rate and effort Abdomen: Soft, mildly tender in the left lower quadrant, nondistended, no abdominal masses Back: No CVA tenderness Extremities: No edema Neurologic: Grossly intact  Laboratory Data:  Results for orders placed or performed during the hospital encounter of 12/30/17 (from the past 24 hour(s))  CBC with Differential     Status:  Abnormal   Collection Time: 12/30/17 12:54 PM  Result Value Ref Range   WBC 14.2 (H) 4.0 - 10.5 K/uL   RBC 4.53 4.22 - 5.81 MIL/uL   Hemoglobin 14.6 13.0 - 17.0 g/dL   HCT 43.6 39.0 - 52.0 %   MCV 96.2 78.0 - 100.0 fL   MCH 32.2 26.0 - 34.0 pg   MCHC 33.5 30.0 - 36.0 g/dL   RDW 13.3 11.5 - 15.5 %   Platelets 242 150 - 400 K/uL   Neutrophils Relative % 82 %   Neutro Abs 11.7 (H) 1.7 - 7.7 K/uL   Lymphocytes Relative 8 %   Lymphs Abs 1.1 0.7 - 4.0 K/uL   Monocytes Relative 10 %   Monocytes Absolute 1.4 (H) 0.1 - 1.0 K/uL   Eosinophils Relative 0 %   Eosinophils Absolute 0.0 0.0 - 0.7 K/uL   Basophils Relative 0 %   Basophils Absolute 0.0 0.0 - 0.1 K/uL  Comprehensive metabolic panel  Status: Abnormal   Collection Time: 12/30/17 12:54 PM  Result Value Ref Range   Sodium 142 135 - 145 mmol/L   Potassium 3.9 3.5 - 5.1 mmol/L   Chloride 107 98 - 111 mmol/L   CO2 24 22 - 32 mmol/L   Glucose, Bld 101 (H) 70 - 99 mg/dL   BUN 18 8 - 23 mg/dL   Creatinine, Ser 1.04 0.61 - 1.24 mg/dL   Calcium 9.5 8.9 - 10.3 mg/dL   Total Protein 7.6 6.5 - 8.1 g/dL   Albumin 3.7 3.5 - 5.0 g/dL   AST 27 15 - 41 U/L   ALT 12 0 - 44 U/L   Alkaline Phosphatase 199 (H) 38 - 126 U/L   Total Bilirubin 0.6 0.3 - 1.2 mg/dL   GFR calc non Af Amer >60 >60 mL/min   GFR calc Af Amer >60 >60 mL/min   Anion gap 11 5 - 15  Lipase, blood     Status: None   Collection Time: 12/30/17 12:54 PM  Result Value Ref Range   Lipase 25 11 - 51 U/L  Urinalysis, Routine w reflex microscopic     Status: Abnormal   Collection Time: 12/30/17 12:54 PM  Result Value Ref Range   Color, Urine YELLOW YELLOW   APPearance CLEAR CLEAR   Specific Gravity, Urine 1.009 1.005 - 1.030   pH 8.0 5.0 - 8.0   Glucose, UA NEGATIVE NEGATIVE mg/dL   Hgb urine dipstick NEGATIVE NEGATIVE   Bilirubin Urine NEGATIVE NEGATIVE   Ketones, ur 5 (A) NEGATIVE mg/dL   Protein, ur NEGATIVE NEGATIVE mg/dL   Nitrite NEGATIVE NEGATIVE   Leukocytes,  UA NEGATIVE NEGATIVE  POC occult blood, ED Provider will collect     Status: None   Collection Time: 12/30/17  2:06 PM  Result Value Ref Range   Fecal Occult Bld NEGATIVE NEGATIVE   No results found for this or any previous visit (from the past 240 hour(s)). Creatinine: Recent Labs    12/28/17 0745 12/30/17 1254  CREATININE 1.08 1.04    Impression/Assessment:  Left hydronephrosis, possibly secondary to metastatic disease versus ureteral scarring from prior radiation Left-sided flank/abdominal pain, possibly secondary to the above versus constipation.  If the patient does not clinically improve, he greatly desires a left ureteral stent. Metastatic prostate cancer Possible early urinary tract infection Constipation  Plan:  I discussed with him the option of observation given that his creatinine is normal as well as ureteral stent.  The patient has not been able to sleep in days and therefore would like to have a ureteral stent since his pain cannot be controlled with anything except morphine currently.  I will give him a suppository and see how he does.  Continue ciprofloxacin.  N.p.o. at midnight for possible stenting tomorrow.  Marton Redwood, III 12/30/2017, 5:48 PM

## 2017-12-31 DIAGNOSIS — N289 Disorder of kidney and ureter, unspecified: Secondary | ICD-10-CM | POA: Diagnosis not present

## 2017-12-31 DIAGNOSIS — R1012 Left upper quadrant pain: Secondary | ICD-10-CM

## 2017-12-31 DIAGNOSIS — C61 Malignant neoplasm of prostate: Secondary | ICD-10-CM | POA: Diagnosis not present

## 2017-12-31 DIAGNOSIS — Z888 Allergy status to other drugs, medicaments and biological substances status: Secondary | ICD-10-CM | POA: Diagnosis not present

## 2017-12-31 DIAGNOSIS — N1330 Unspecified hydronephrosis: Secondary | ICD-10-CM | POA: Diagnosis not present

## 2017-12-31 DIAGNOSIS — F419 Anxiety disorder, unspecified: Secondary | ICD-10-CM | POA: Diagnosis not present

## 2017-12-31 DIAGNOSIS — I10 Essential (primary) hypertension: Secondary | ICD-10-CM

## 2017-12-31 DIAGNOSIS — Z8719 Personal history of other diseases of the digestive system: Secondary | ICD-10-CM | POA: Diagnosis not present

## 2017-12-31 DIAGNOSIS — G4700 Insomnia, unspecified: Secondary | ICD-10-CM | POA: Diagnosis not present

## 2017-12-31 DIAGNOSIS — K5900 Constipation, unspecified: Secondary | ICD-10-CM | POA: Diagnosis present

## 2017-12-31 DIAGNOSIS — R590 Localized enlarged lymph nodes: Secondary | ICD-10-CM | POA: Diagnosis not present

## 2017-12-31 DIAGNOSIS — K449 Diaphragmatic hernia without obstruction or gangrene: Secondary | ICD-10-CM

## 2017-12-31 DIAGNOSIS — Z79899 Other long term (current) drug therapy: Secondary | ICD-10-CM | POA: Diagnosis not present

## 2017-12-31 DIAGNOSIS — N1339 Other hydronephrosis: Secondary | ICD-10-CM | POA: Diagnosis not present

## 2017-12-31 DIAGNOSIS — K219 Gastro-esophageal reflux disease without esophagitis: Secondary | ICD-10-CM | POA: Diagnosis not present

## 2017-12-31 DIAGNOSIS — C7951 Secondary malignant neoplasm of bone: Secondary | ICD-10-CM | POA: Diagnosis not present

## 2017-12-31 DIAGNOSIS — E559 Vitamin D deficiency, unspecified: Secondary | ICD-10-CM | POA: Diagnosis not present

## 2017-12-31 DIAGNOSIS — I700 Atherosclerosis of aorta: Secondary | ICD-10-CM | POA: Diagnosis not present

## 2017-12-31 DIAGNOSIS — Z91041 Radiographic dye allergy status: Secondary | ICD-10-CM | POA: Diagnosis not present

## 2017-12-31 LAB — BASIC METABOLIC PANEL
Anion gap: 6 (ref 5–15)
CO2: 25 mmol/L (ref 22–32)
Chloride: 108 mmol/L (ref 98–111)
Creatinine, Ser: 1.33 mg/dL — ABNORMAL HIGH (ref 0.61–1.24)
GFR calc Af Amer: 54 mL/min — ABNORMAL LOW (ref >60)
GFR calc non Af Amer: 47 mL/min — ABNORMAL LOW (ref >60)
Potassium: 4.3 mmol/L (ref 3.5–5.1)
Sodium: 139 mmol/L (ref 135–145)

## 2017-12-31 LAB — CBC
HCT: 38.2 % — ABNORMAL LOW (ref 39.0–52.0)
Hemoglobin: 12.1 g/dL — ABNORMAL LOW (ref 13.0–17.0)
MCH: 31.8 pg (ref 26.0–34.0)
MCHC: 31.7 g/dL (ref 30.0–36.0)
MCV: 100.3 fL — ABNORMAL HIGH (ref 78.0–100.0)
Platelets: 237 K/uL (ref 150–400)
RBC: 3.81 MIL/uL — ABNORMAL LOW (ref 4.22–5.81)
RDW: 13.7 % (ref 11.5–15.5)
WBC: 11.7 10*3/uL — ABNORMAL HIGH (ref 4.0–10.5)

## 2017-12-31 LAB — BASIC METABOLIC PANEL WITH GFR
BUN: 20 mg/dL (ref 8–23)
Calcium: 8.3 mg/dL — ABNORMAL LOW (ref 8.9–10.3)
Glucose, Bld: 122 mg/dL — ABNORMAL HIGH (ref 70–99)

## 2017-12-31 MED ORDER — POLYETHYLENE GLYCOL 3350 17 G PO PACK
17.0000 g | PACK | Freq: Every day | ORAL | Status: DC
  Administered 2017-12-31 – 2018-01-01 (×2): 17 g via ORAL
  Filled 2017-12-31 (×2): qty 1

## 2017-12-31 NOTE — Progress Notes (Signed)
Urology Inpatient Progress Report  Hydronephrosis of left kidney [N13.30]        Intv/Subj: Patient had a great bowel movement last night.  His pain resolved.  He feels great this morning.  However, creatinine increased a small amount from 1.04-1.33.  White blood cell count is decreasing on aztreonam.  Active Problems:   HTN (hypertension)   LUQ abdominal pain   Large hiatal hernia   Prostate cancer (HCC)   S/P prostatectomy   Prostate cancer metastatic to bone (HCC)   Hydronephrosis of left kidney  Current Facility-Administered Medications  Medication Dose Route Frequency Provider Last Rate Last Dose  . 0.9 %  sodium chloride infusion   Intravenous Continuous Purohit, Konrad Dolores, MD 75 mL/hr at 12/31/17 0741    . acetaminophen (TYLENOL) tablet 650 mg  650 mg Oral Q6H PRN Purohit, Konrad Dolores, MD       Or  . acetaminophen (TYLENOL) suppository 650 mg  650 mg Rectal Q6H PRN Purohit, Shrey C, MD      . aztreonam (AZACTAM) 1 g in sodium chloride 0.9 % 100 mL IVPB  1 g Intravenous Q8H Purohit, Shrey C, MD 200 mL/hr at 12/31/17 0907 1 g at 12/31/17 0907  . enoxaparin (LOVENOX) injection 40 mg  40 mg Subcutaneous Q24H Purohit, Shrey C, MD   40 mg at 12/30/17 2133  . HYDROmorphone (DILAUDID) injection 0.5 mg  0.5 mg Intravenous Q3H PRN Purohit, Shrey C, MD      . losartan (COZAAR) tablet 100 mg  100 mg Oral Daily Purohit, Shrey C, MD   100 mg at 12/31/17 0906  . magnesium citrate solution 1 Bottle  1 Bottle Oral Once PRN Purohit, Shrey C, MD      . ondansetron (ZOFRAN) tablet 4 mg  4 mg Oral Q6H PRN Purohit, Shrey C, MD       Or  . ondansetron (ZOFRAN) injection 4 mg  4 mg Intravenous Q6H PRN Purohit, Shrey C, MD      . oxyCODONE (Oxy IR/ROXICODONE) immediate release tablet 5 mg  5 mg Oral Q4H PRN Purohit, Shrey C, MD      . polyethylene glycol (MIRALAX / GLYCOLAX) packet 17 g  17 g Oral Daily PRN Purohit, Konrad Dolores, MD         Objective: Vital: Vitals:   12/30/17 1706 12/30/17 2016  12/31/17 0538 12/31/17 0545  BP: (!) 146/98 123/64  112/60  Pulse: (!) 118 96 (!) 110 80  Resp: 18 18  18   Temp: 99.6 F (37.6 C) 98.9 F (37.2 C)  99.1 F (37.3 C)  TempSrc: Oral Oral  Oral  SpO2: 95% 94% 96% 91%  Weight:      Height:       I/Os: I/O last 3 completed shifts: In: 1853.3 [I.V.:571.9; IV Piggyback:1281.4] Out: 151 [Urine:150; Stool:1]  Physical Exam:  General: Patient is in no apparent distress Lungs: Normal respiratory effort, chest expands symmetrically. GI: The abdomen is soft and nontender without mass. Ext: lower extremities symmetric  Lab Results: Recent Labs    12/30/17 1254 12/31/17 0535  WBC 14.2* 11.7*  HGB 14.6 12.1*  HCT 43.6 38.2*   Recent Labs    12/30/17 1254 12/31/17 0535  NA 142 139  K 3.9 4.3  CL 107 108  CO2 24 25  GLUCOSE 101* 122*  BUN 18 20  CREATININE 1.04 1.33*  CALCIUM 9.5 8.3*   No results for input(s): LABPT, INR in the last 72 hours. No results for input(s):  LABURIN in the last 72 hours. Results for orders placed or performed during the hospital encounter of 09/16/17  Surgical PCR screen     Status: None   Collection Time: 09/16/17 10:24 PM  Result Value Ref Range Status   MRSA, PCR NEGATIVE NEGATIVE Final   Staphylococcus aureus NEGATIVE NEGATIVE Final    Comment: (NOTE) The Xpert SA Assay (FDA approved for NASAL specimens in patients 63 years of age and older), is one component of a comprehensive surveillance program. It is not intended to diagnose infection nor to guide or monitor treatment. Performed at [facility], [place] [address] [place], [place] [address]     Studies/Results: Ct Abdomen Pelvis Wo Contrast  Result Date: 12/30/2017 CLINICAL DATA:  Acute left-sided abdominal pain. History of prostate cancer. EXAM: CT ABDOMEN AND PELVIS WITHOUT CONTRAST TECHNIQUE: Multidetector CT imaging of the abdomen and pelvis was performed following the standard protocol without IV contrast.  COMPARISON:  CT scan of December 28, 2017. FINDINGS: Lower chest: Stable left basilar atelectasis is noted. Stable large hiatal hernia is noted which contains a portion of transverse colon, but does not result in incarceration or obstruction. Hepatobiliary: No focal liver abnormality is seen. No gallstones, gallbladder wall thickening, or biliary dilatation. Pancreas: Unremarkable. No pancreatic ductal dilatation or surrounding inflammatory changes. Spleen: Normal in size without focal abnormality. Adrenals/Urinary Tract: Adrenal glands appear normal. Right kidney and ureter are unremarkable. Urinary bladder is unremarkable. Moderate left hydronephrosis is noted with moderate dilatation of the left renal pelvis and proximal left ureter with surrounding inflammation suggesting urinary tract infection. The middle and distal portions of the left ureter are normal in caliber and there is no evidence of obstructing calculus. Therefore, this most likely is due to ureteral stricture or scarring. Stomach/Bowel: There is no evidence of bowel obstruction. Diverticulosis of the colon is noted without inflammation. The appendix is not visualized. Vascular/Lymphatic: Aortic atherosclerosis. No enlarged abdominal or pelvic lymph nodes. Reproductive: Status post prostatectomy. Other: No abdominal wall hernia or abnormality. No abdominopelvic ascites. Musculoskeletal: Stable sclerotic lesions are seen involving both acetabuli, left greater than right. Stable sclerotic densities are noted in L3 consistent with metastatic disease. IMPRESSION: Moderate left hydronephrosis is noted with moderate dilatation of the left renal pelvis and proximal left ureter without obstructing calculus. This most likely is due to ureteral stricture or scarring. There is noted increased inflammatory changes around the left renal pelvis and proximal left ureter suggesting urinary tract infection. Stable large hiatal hernia is noted which also contains a  portion of transverse colon, but does not result in incarceration or obstruction. Diverticulosis of the colon is noted without inflammation. Status post prostatectomy. Stable sclerotic lesions are noted in the pelvis and L3 vertebral body concerning for metastatic disease. Aortic Atherosclerosis (ICD10-I70.0). Electronically Signed   By: Marijo Conception, M.D.   On: 12/30/2017 14:21    Assessment: Left hydronephrosis Acute renal insufficiency  Plan: Consider transitioning to a p.o. antibiotic.  Recommend rechecking creatinine in the morning to ensure this is stable and not continuing to increase.  Given that his pain completely resolved, recommend against ureteral stent placement today.  Left lower quadrant pain was likely secondary to constipation.  Stent is more likely to cause increased morbidity from stent irritation and provide limited benefit currently.  If creatinine continues to worsen, can consider ureteral stent placement but we will hold off.   Link Snuffer, MD Urology 12/31/2017, 10:27 AM

## 2017-12-31 NOTE — Progress Notes (Signed)
PROGRESS NOTE    Jorge [name]  LKG:[id number] DOB: 1929-05-07 DOA: 12/30/2017 PCP: Cassandria Anger, MD    Brief Narrative:  84 y.o. male with medical history significant of prostate cancer status post prostatectomy in 6644 complicated by bone metastases on leuprolide, hypertension, multiple hernias with revisions who presents with left lower quadrant abdominal pain and found to have left-sided hydronephrosis.  Patient reports he is feeling fairly well until approximately 3 days ago when he began to develop left lower quadrant abdominal pain.  Patient describes the pain as sharp and nonradiating.  Pain was constant.  Patient was able to eat but had decreased appetite.  Patient reports that eating did not improve or worsen the pain.  He did not have any diarrhea or constipation.  He has had dry heaves and some nausea but no clear emesis.  Patient did not have any fevers, dysuria, change in urine color, groin pain.  Patient initially presented to the ED 2 days ago for the abdominal pain or labs were unremarkable and patient was sent home.  ED Course: In the ED patient was noted to be mildly tachycardic and hypertensive.  Patient was afebrile.  White count was notable at 14.2.  UA just showed ketonuria.  CT abdomen and pelvis showed moderate left-sided hydronephrosis with no obstructing calculus and evidence of inflammatory changes on the left renal pelvis concerning for UTI.  Patient was noted to also have large hiatal hernia.  Assessment & Plan:   Active Problems:   HTN (hypertension)   LUQ abdominal pain   Large hiatal hernia   Prostate cancer (HCC)   S/P prostatectomy   Prostate cancer metastatic to bone [facility])   Hydronephrosis of left kidney  #) Left-sided hydronephrosis  -Urology following. No indications for stent placement at this time -Abd pain noted prior to admission. Symptoms resolved after large BM overnight -CT abd reviewed personally. Stool noted throughout colon per my  own read -Suspect presenting abd pain may have been attributed to constipation -Repeat bmet in AM per Urology recs  #) Hypertension: - Continue losartan 100 mg daily as tolerated - BP stable at present  #) Metastatic prostate cancer:  -Noted to be s/p prostatectomy in 2008.  #) Recurrent abdominal hernias:  -Currently without evidence of obstruction  # Constipation -Patient reports no BM in over one week prior to hospital visit -Very good results with suppository overnight -Continue on miralax daily  DVT prophylaxis: Lovenox subQ Code Status: Full Family Communication: Pt in room, family not at bedside Disposition Plan: Possible d/c home in 24hrs  Consultants:   Urology  Procedures:     Antimicrobials: Anti-infectives (From admission, onward)   Start     Dose/Rate Route Frequency Ordered Stop   12/31/17 0200  aztreonam (AZACTAM) 1 g in sodium chloride 0.9 % 100 mL IVPB     1 g 200 mL/hr over 30 Minutes Intravenous Every 8 hours 12/30/17 1746     12/30/17 1745  aztreonam (AZACTAM) injection 1 g  Status:  Discontinued     1 g Intramuscular Every 8 hours 12/30/17 1742 12/30/17 1744   12/30/17 1530  ciprofloxacin (CIPRO) IVPB 400 mg    Note to Pharmacy:  Patient has cephalosporin allergy   400 mg 200 mL/hr over 60 Minutes Intravenous  Once 12/30/17 1529 12/30/17 1749       Subjective: Eager to go home  Objective: Vitals:   12/30/17 2016 12/31/17 0538 12/31/17 0545 12/31/17 1543  BP: 123/64  112/60 124/67  Pulse: 96 (!) 110 80 73  Resp: 18  18 18   Temp: 98.9 F (37.2 C)  99.1 F (37.3 C) 99.2 F (37.3 C)  TempSrc: Oral  Oral Oral  SpO2: 94% 96% 91% 93%  Weight:      Height:        Intake/Output Summary (Last 24 hours) at 12/31/2017 1553 Last data filed at 12/31/2017 1100 Gross per 24 hour  Intake 853.33 ml  Output 2 ml  Net 851.33 ml   Filed Weights   12/30/17 1143  Weight: 93.4 kg    Examination:  General exam: Appears calm and  comfortable  Respiratory system: Clear to auscultation. Respiratory effort normal. Cardiovascular system: S1 & S2 heard, RRR Gastrointestinal system: Abdomen is nondistended, soft and nontender. No organomegaly or masses felt. Normal bowel sounds heard. Central nervous system: Alert and oriented. No focal neurological deficits. Extremities: Symmetric 5 x 5 power. Skin: No rashes, lesions  Psychiatry: Judgement and insight appear normal. Mood & affect appropriate.   Data Reviewed: I have personally reviewed following labs and imaging studies  CBC: Recent Labs  Lab 12/28/17 0745 12/30/17 1254 12/31/17 0535  WBC 9.8 14.2* 11.7*  NEUTROABS  --  11.7*  --   HGB 14.1 14.6 12.1*  HCT 42.0 43.6 38.2*  MCV 96.8 96.2 100.3*  PLT 222 242 287   Basic Metabolic Panel: Recent Labs  Lab 12/28/17 0745 12/30/17 1254 12/31/17 0535  NA 140 142 139  K 4.3 3.9 4.3  CL 108 107 108  CO2 23 24 25   GLUCOSE 128* 101* 122*  BUN 12 18 20   CREATININE 1.08 1.04 1.33*  CALCIUM 9.3 9.5 8.3*   GFR: Estimated Creatinine Clearance: 45.7 mL/min (A) (by C-G formula based on SCr of 1.33 mg/dL (H)). Liver Function Tests: Recent Labs  Lab 12/28/17 0745 12/30/17 1254  AST 26 27  ALT 14 12  ALKPHOS 223* 199*  BILITOT 0.7 0.6  PROT 6.6 7.6  ALBUMIN 3.6 3.7   Recent Labs  Lab 12/28/17 0745 12/30/17 1254  LIPASE 26 25   No results for input(s): AMMONIA in the last 168 hours. Coagulation Profile: No results for input(s): INR, PROTIME in the last 168 hours. Cardiac Enzymes: No results for input(s): CKTOTAL, CKMB, CKMBINDEX, TROPONINI in the last 168 hours. BNP (last 3 results) No results for input(s): PROBNP in the last 8760 hours. HbA1C: No results for input(s): HGBA1C in the last 72 hours. CBG: No results for input(s): GLUCAP in the last 168 hours. Lipid Profile: No results for input(s): CHOL, HDL, LDLCALC, TRIG, CHOLHDL, LDLDIRECT in the last 72 hours. Thyroid Function Tests: No results  for input(s): TSH, T4TOTAL, FREET4, T3FREE, THYROIDAB in the last 72 hours. Anemia Panel: No results for input(s): VITAMINB12, FOLATE, FERRITIN, TIBC, IRON, RETICCTPCT in the last 72 hours. Sepsis Labs: No results for input(s): PROCALCITON, LATICACIDVEN in the last 168 hours.  No results found for this or any previous visit (from the past 240 hour(s)).   Radiology Studies: Ct Abdomen Pelvis Wo Contrast  Result Date: 12/30/2017 CLINICAL DATA:  Acute left-sided abdominal pain. History of prostate cancer. EXAM: CT ABDOMEN AND PELVIS WITHOUT CONTRAST TECHNIQUE: Multidetector CT imaging of the abdomen and pelvis was performed following the standard protocol without IV contrast. COMPARISON:  CT scan of December 28, 2017. FINDINGS: Lower chest: Stable left basilar atelectasis is noted. Stable large hiatal hernia is noted which contains a portion of transverse colon, but does not result in incarceration or obstruction. Hepatobiliary: No focal  liver abnormality is seen. No gallstones, gallbladder wall thickening, or biliary dilatation. Pancreas: Unremarkable. No pancreatic ductal dilatation or surrounding inflammatory changes. Spleen: Normal in size without focal abnormality. Adrenals/Urinary Tract: Adrenal glands appear normal. Right kidney and ureter are unremarkable. Urinary bladder is unremarkable. Moderate left hydronephrosis is noted with moderate dilatation of the left renal pelvis and proximal left ureter with surrounding inflammation suggesting urinary tract infection. The middle and distal portions of the left ureter are normal in caliber and there is no evidence of obstructing calculus. Therefore, this most likely is due to ureteral stricture or scarring. Stomach/Bowel: There is no evidence of bowel obstruction. Diverticulosis of the colon is noted without inflammation. The appendix is not visualized. Vascular/Lymphatic: Aortic atherosclerosis. No enlarged abdominal or pelvic lymph nodes. Reproductive:  Status post prostatectomy. Other: No abdominal wall hernia or abnormality. No abdominopelvic ascites. Musculoskeletal: Stable sclerotic lesions are seen involving both acetabuli, left greater than right. Stable sclerotic densities are noted in L3 consistent with metastatic disease. IMPRESSION: Moderate left hydronephrosis is noted with moderate dilatation of the left renal pelvis and proximal left ureter without obstructing calculus. This most likely is due to ureteral stricture or scarring. There is noted increased inflammatory changes around the left renal pelvis and proximal left ureter suggesting urinary tract infection. Stable large hiatal hernia is noted which also contains a portion of transverse colon, but does not result in incarceration or obstruction. Diverticulosis of the colon is noted without inflammation. Status post prostatectomy. Stable sclerotic lesions are noted in the pelvis and L3 vertebral body concerning for metastatic disease. Aortic Atherosclerosis (ICD10-I70.0). Electronically Signed   By: Marijo Conception, M.D.   On: 12/30/2017 14:21    Scheduled Meds: . enoxaparin (LOVENOX) injection  40 mg Subcutaneous Q24H  . losartan  100 mg Oral Daily  . polyethylene glycol  17 g Oral Daily   Continuous Infusions: . sodium chloride 75 mL/hr at 12/31/17 0741  . aztreonam 1 g (12/31/17 0907)     LOS: 1 day   Marylu Lund, MD [facility] Pager On Amion  If 7PM-7AM, please contact night-coverage 12/31/2017, 3:53 PM

## 2018-01-01 ENCOUNTER — Telehealth: Payer: Self-pay | Admitting: *Deleted

## 2018-01-01 DIAGNOSIS — E559 Vitamin D deficiency, unspecified: Secondary | ICD-10-CM | POA: Diagnosis not present

## 2018-01-01 DIAGNOSIS — F419 Anxiety disorder, unspecified: Secondary | ICD-10-CM | POA: Diagnosis not present

## 2018-01-01 DIAGNOSIS — N1339 Other hydronephrosis: Secondary | ICD-10-CM | POA: Diagnosis not present

## 2018-01-01 DIAGNOSIS — R590 Localized enlarged lymph nodes: Secondary | ICD-10-CM | POA: Diagnosis not present

## 2018-01-01 DIAGNOSIS — K5900 Constipation, unspecified: Secondary | ICD-10-CM | POA: Diagnosis not present

## 2018-01-01 DIAGNOSIS — Z91041 Radiographic dye allergy status: Secondary | ICD-10-CM | POA: Diagnosis not present

## 2018-01-01 DIAGNOSIS — Z79899 Other long term (current) drug therapy: Secondary | ICD-10-CM | POA: Diagnosis not present

## 2018-01-01 DIAGNOSIS — N1330 Unspecified hydronephrosis: Secondary | ICD-10-CM | POA: Diagnosis not present

## 2018-01-01 DIAGNOSIS — I10 Essential (primary) hypertension: Secondary | ICD-10-CM | POA: Diagnosis not present

## 2018-01-01 DIAGNOSIS — I700 Atherosclerosis of aorta: Secondary | ICD-10-CM | POA: Diagnosis not present

## 2018-01-01 DIAGNOSIS — N289 Disorder of kidney and ureter, unspecified: Secondary | ICD-10-CM | POA: Diagnosis not present

## 2018-01-01 DIAGNOSIS — Z8719 Personal history of other diseases of the digestive system: Secondary | ICD-10-CM | POA: Diagnosis not present

## 2018-01-01 DIAGNOSIS — R1012 Left upper quadrant pain: Secondary | ICD-10-CM | POA: Diagnosis not present

## 2018-01-01 DIAGNOSIS — Z888 Allergy status to other drugs, medicaments and biological substances status: Secondary | ICD-10-CM | POA: Diagnosis not present

## 2018-01-01 DIAGNOSIS — K449 Diaphragmatic hernia without obstruction or gangrene: Secondary | ICD-10-CM | POA: Diagnosis not present

## 2018-01-01 DIAGNOSIS — C7951 Secondary malignant neoplasm of bone: Secondary | ICD-10-CM | POA: Diagnosis not present

## 2018-01-01 DIAGNOSIS — G4700 Insomnia, unspecified: Secondary | ICD-10-CM | POA: Diagnosis not present

## 2018-01-01 DIAGNOSIS — K219 Gastro-esophageal reflux disease without esophagitis: Secondary | ICD-10-CM | POA: Diagnosis not present

## 2018-01-01 LAB — BASIC METABOLIC PANEL
Anion gap: 6 (ref 5–15)
BUN: 17 mg/dL (ref 8–23)
CALCIUM: 8.6 mg/dL — AB (ref 8.9–10.3)
CHLORIDE: 110 mmol/L (ref 98–111)
CO2: 26 mmol/L (ref 22–32)
CREATININE: 1.18 mg/dL (ref 0.61–1.24)
GFR calc Af Amer: >60 mL/min (ref >60)
GFR calc non Af Amer: 54 mL/min — ABNORMAL LOW (ref >60)
GLUCOSE: 117 mg/dL — AB (ref 70–99)
Potassium: 4.5 mmol/L (ref 3.5–5.1)
Sodium: 142 mmol/L (ref 135–145)

## 2018-01-01 MED ORDER — POLYETHYLENE GLYCOL 3350 17 G PO PACK: 17.0000 g | PACK | Freq: Every day | ORAL | 0 refills | Status: DC

## 2018-01-01 NOTE — H&P (View-Only) (Signed)
Subjective: Patient reports feeling much better--no pain following BM  Objective: Vital signs in last 24 hours: Temp:  [99.2 F (37.3 C)-99.6 F (37.6 C)] 99.6 F (37.6 C) (09/30 0418) Pulse Rate:  [72-82] 82 (09/30 0418) Resp:  [16-18] 16 (09/30 0418) BP: (124-156)/(67-79) 156/79 (09/30 0418) SpO2:  [93 %-94 %] 94 % (09/30 0418)  Intake/Output from previous day: 09/29 0701 - 09/30 0700 In: 2609.9 [P.O.:340; I.V.:1951.4; IV Piggyback:318.4] Out: 1 [Urine:1] Intake/Output this shift: No intake/output data recorded.  Physical Exam:  Constitutional: Vital signs reviewed. WD WN in NAD   Eyes: PERRL, No scleral icterus.   Cardiovascular: RRR Pulmonary/Chest: Normal effort Extremities: No cyanosis or edema   Lab Results: Recent Labs    12/30/17 1254 12/31/17 0535  HGB 14.6 12.1*  HCT 43.6 38.2*   BMET Recent Labs    12/31/17 0535 01/01/18 0526  NA 139 142  K 4.3 4.5  CL 108 110  CO2 25 26  GLUCOSE 122* 117*  BUN 20 17  CREATININE 1.33* 1.18  CALCIUM 8.3* 8.6*   No results for input(s): LABPT, INR in the last 72 hours. No results for input(s): LABURIN in the last 72 hours. Results for orders placed or performed during the hospital encounter of 09/16/17  Surgical PCR screen     Status: None   Collection Time: 09/16/17 10:24 PM  Result Value Ref Range Status   MRSA, PCR NEGATIVE NEGATIVE Final   Staphylococcus aureus NEGATIVE NEGATIVE Final    Comment: (NOTE) The Xpert SA Assay (FDA approved for NASAL specimens in patients 39 years of age and older), is one component of a comprehensive surveillance program. It is not intended to diagnose infection nor to guide or monitor treatment. Performed at [facility], [place] [address], [place], [place] [address]     Studies/Results: Ct Abdomen Pelvis Wo Contrast  Result Date: 12/30/2017 CLINICAL DATA:  Acute left-sided abdominal pain. History of prostate cancer. EXAM: CT ABDOMEN AND PELVIS  WITHOUT CONTRAST TECHNIQUE: Multidetector CT imaging of the abdomen and pelvis was performed following the standard protocol without IV contrast. COMPARISON:  CT scan of December 28, 2017. FINDINGS: Lower chest: Stable left basilar atelectasis is noted. Stable large hiatal hernia is noted which contains a portion of transverse colon, but does not result in incarceration or obstruction. Hepatobiliary: No focal liver abnormality is seen. No gallstones, gallbladder wall thickening, or biliary dilatation. Pancreas: Unremarkable. No pancreatic ductal dilatation or surrounding inflammatory changes. Spleen: Normal in size without focal abnormality. Adrenals/Urinary Tract: Adrenal glands appear normal. Right kidney and ureter are unremarkable. Urinary bladder is unremarkable. Moderate left hydronephrosis is noted with moderate dilatation of the left renal pelvis and proximal left ureter with surrounding inflammation suggesting urinary tract infection. The middle and distal portions of the left ureter are normal in caliber and there is no evidence of obstructing calculus. Therefore, this most likely is due to ureteral stricture or scarring. Stomach/Bowel: There is no evidence of bowel obstruction. Diverticulosis of the colon is noted without inflammation. The appendix is not visualized. Vascular/Lymphatic: Aortic atherosclerosis. No enlarged abdominal or pelvic lymph nodes. Reproductive: Status post prostatectomy. Other: No abdominal wall hernia or abnormality. No abdominopelvic ascites. Musculoskeletal: Stable sclerotic lesions are seen involving both acetabuli, left greater than right. Stable sclerotic densities are noted in L3 consistent with metastatic disease. IMPRESSION: Moderate left hydronephrosis is noted with moderate dilatation of the left renal pelvis and proximal left ureter without obstructing calculus. This most likely is due to ureteral stricture  or scarring. There is noted increased inflammatory changes  around the left renal pelvis and proximal left ureter suggesting urinary tract infection. Stable large hiatal hernia is noted which also contains a portion of transverse colon, but does not result in incarceration or obstruction. Diverticulosis of the colon is noted without inflammation. Status post prostatectomy. Stable sclerotic lesions are noted in the pelvis and L3 vertebral body concerning for metastatic disease. Aortic Atherosclerosis (ICD10-I70.0). Electronically Signed   By: Marijo Conception, M.D.   On: 12/30/2017 14:21    Assessment/Plan:   Lt hydro secondary to advancing PCa--pt not in pain from this and SCr better  I can manage this w/ outpt stenting--will arrange  OK to d/c from GU standpoint      LOS: 1 day   Jorja Loa 01/01/2018, 9:40 AM

## 2018-01-01 NOTE — Progress Notes (Signed)
Subjective: Patient reports feeling much better--no pain following BM  Objective: Vital signs in last 24 hours: Temp:  [99.2 F (37.3 C)-99.6 F (37.6 C)] 99.6 F (37.6 C) (09/30 0418) Pulse Rate:  [72-82] 82 (09/30 0418) Resp:  [16-18] 16 (09/30 0418) BP: (124-156)/(67-79) 156/79 (09/30 0418) SpO2:  [93 %-94 %] 94 % (09/30 0418)  Intake/Output from previous day: 09/29 0701 - 09/30 0700 In: 2609.9 [P.O.:340; I.V.:1951.4; IV Piggyback:318.4] Out: 1 [Urine:1] Intake/Output this shift: No intake/output data recorded.  Physical Exam:  Constitutional: Vital signs reviewed. WD WN in NAD   Eyes: PERRL, No scleral icterus.   Cardiovascular: RRR Pulmonary/Chest: Normal effort Extremities: No cyanosis or edema   Lab Results: Recent Labs    12/30/17 1254 12/31/17 0535  HGB 14.6 12.1*  HCT 43.6 38.2*   BMET Recent Labs    12/31/17 0535 01/01/18 0526  NA 139 142  K 4.3 4.5  CL 108 110  CO2 25 26  GLUCOSE 122* 117*  BUN 20 17  CREATININE 1.33* 1.18  CALCIUM 8.3* 8.6*   No results for input(s): LABPT, INR in the last 72 hours. No results for input(s): LABURIN in the last 72 hours. Results for orders placed or performed during the hospital encounter of 09/16/17  Surgical PCR screen     Status: None   Collection Time: 09/16/17 10:24 PM  Result Value Ref Range Status   MRSA, PCR NEGATIVE NEGATIVE Final   Staphylococcus aureus NEGATIVE NEGATIVE Final    Comment: (NOTE) The Xpert SA Assay (FDA approved for NASAL specimens in patients 37 years of age and older), is one component of a comprehensive surveillance program. It is not intended to diagnose infection nor to guide or monitor treatment. Performed at [facility], [place] [address], [place], [place] [address]     Studies/Results: Ct Abdomen Pelvis Wo Contrast  Result Date: 12/30/2017 CLINICAL DATA:  Acute left-sided abdominal pain. History of prostate cancer. EXAM: CT ABDOMEN AND PELVIS  WITHOUT CONTRAST TECHNIQUE: Multidetector CT imaging of the abdomen and pelvis was performed following the standard protocol without IV contrast. COMPARISON:  CT scan of December 28, 2017. FINDINGS: Lower chest: Stable left basilar atelectasis is noted. Stable large hiatal hernia is noted which contains a portion of transverse colon, but does not result in incarceration or obstruction. Hepatobiliary: No focal liver abnormality is seen. No gallstones, gallbladder wall thickening, or biliary dilatation. Pancreas: Unremarkable. No pancreatic ductal dilatation or surrounding inflammatory changes. Spleen: Normal in size without focal abnormality. Adrenals/Urinary Tract: Adrenal glands appear normal. Right kidney and ureter are unremarkable. Urinary bladder is unremarkable. Moderate left hydronephrosis is noted with moderate dilatation of the left renal pelvis and proximal left ureter with surrounding inflammation suggesting urinary tract infection. The middle and distal portions of the left ureter are normal in caliber and there is no evidence of obstructing calculus. Therefore, this most likely is due to ureteral stricture or scarring. Stomach/Bowel: There is no evidence of bowel obstruction. Diverticulosis of the colon is noted without inflammation. The appendix is not visualized. Vascular/Lymphatic: Aortic atherosclerosis. No enlarged abdominal or pelvic lymph nodes. Reproductive: Status post prostatectomy. Other: No abdominal wall hernia or abnormality. No abdominopelvic ascites. Musculoskeletal: Stable sclerotic lesions are seen involving both acetabuli, left greater than right. Stable sclerotic densities are noted in L3 consistent with metastatic disease. IMPRESSION: Moderate left hydronephrosis is noted with moderate dilatation of the left renal pelvis and proximal left ureter without obstructing calculus. This most likely is due to ureteral stricture  or scarring. There is noted increased inflammatory changes  around the left renal pelvis and proximal left ureter suggesting urinary tract infection. Stable large hiatal hernia is noted which also contains a portion of transverse colon, but does not result in incarceration or obstruction. Diverticulosis of the colon is noted without inflammation. Status post prostatectomy. Stable sclerotic lesions are noted in the pelvis and L3 vertebral body concerning for metastatic disease. Aortic Atherosclerosis (ICD10-I70.0). Electronically Signed   By: Marijo Conception, M.D.   On: 12/30/2017 14:21    Assessment/Plan:   Lt hydro secondary to advancing PCa--pt not in pain from this and SCr better  I can manage this w/ outpt stenting--will arrange  OK to d/c from GU standpoint      LOS: 1 day   Jorja Loa 01/01/2018, 9:40 AM

## 2018-01-01 NOTE — Care Management Note (Signed)
Case Management Note  Patient Details  Name: Jorge [name] MRN: [id number] Date of Birth: July 28, 1929  Subjective/Objective:  No CM needs.                  Action/Plan:d/c home.   Expected Discharge Date:                  Expected Discharge Plan:  Home/Self Care  In-House Referral:     Discharge planning Services  CM Consult  Post Acute Care Choice:    Choice offered to:     DME Arranged:    DME Agency:     HH Arranged:    [place] Agency:     Status of Service:  Completed, signed off  If discussed at [employer] of Stay Meetings, dates discussed:    Additional Comments:  Dessa Phi, RN 01/01/2018, 12:40 PM

## 2018-01-01 NOTE — Telephone Encounter (Signed)
Transition Care Management Follow-up Telephone Call   Date discharged? 01/01/18   How have you been since you were released from the hospital? Pt states he is doing alright just got home not to long ago   Do you understand why you were in the hospital? YES   Do you understand the discharge instructions? YES   Where were you discharged to? Home   Items Reviewed:  Medications reviewed: YES  Allergies reviewed: YES  Dietary changes reviewed: NO  Referrals reviewed: YES, he states someone should be calling him to set up appt w/urologist   Functional Questionnaire:   Activities of Daily Living (ADLs):   He states he are independent in the following: ambulation, bathing and hygiene, feeding, continence, grooming, toileting and dressing States he doesn't require assistance    Any transportation issues/concerns?: NO   Any patient concerns? NO   Confirmed importance and date/time of follow-up visits scheduled YES, appt 01/10/18  Provider Appointment booked with Dr. Alain Marion  Confirmed with patient if condition begins to worsen call PCP or go to the ER.  Patient was given the office number and encouraged to call back with question or concerns.  : YES

## 2018-01-01 NOTE — Discharge Summary (Signed)
Physician Discharge Summary  Jorge [name] XTG:[id number] DOB: [birth year] DOA: 12/30/2017  PCP: Cassandria Anger, MD  Admit date: 12/30/2017 Discharge date: 01/01/2018  Admitted From: Home Disposition:  Home  Recommendations for Outpatient Follow-up:  1. Follow up with PCP in 1-2 weeks 2. Follow up with Urology as scheduled  Discharge Condition:Stable CODE STATUS:Ful Diet recommendation: Regular   Brief/Interim Summary: 84 y.o.malewith medical history significant ofprostate cancer status post prostatectomy in 2703 complicated by bone metastases on leuprolide, hypertension, multiple hernias with revisions who presents with left lower quadrant abdominal pain and found to have left-sided hydronephrosis. Patient reports he is feeling fairly well until approximately 3 days ago when he began to develop left lower quadrant abdominal pain. Patient describes the pain as sharp and nonradiating. Pain was constant. Patient was able to eat but had decreased appetite. Patient reports that eating did not improve or worsen the pain. He did not have any diarrhea or constipation. He has had dry heaves and some nausea but no clear emesis. Patient did not have any fevers, dysuria, change in urine color, groin pain. Patient initially presented to the ED 2 days ago for the abdominal pain or labs were unremarkable and patient was sent home.  ED Course:In the ED patient was noted to be mildly tachycardic and hypertensive. Patient was afebrile. White count was notable at 14.2. UA just showed ketonuria. CT abdomen and pelvis showed moderate left-sided hydronephrosis with no obstructing calculus and evidence of inflammatory changes on the left renal pelvis concerning for UTI. Patient was noted to also have large hiatal hernia.  #) Left-sided hydronephrosis  -Urology following. No indications for stent placement at this time -Abd pain noted prior to admission. Symptoms resolved after large BM  overnight -CT abd reviewed personally. Stool noted throughout colon per my own read -Suspect presenting abd pain may have been attributed to constipation -Repeat bmet in AM with improved renal function -Per Urology, [facility] for d/c with outpatient follow up by Urology  #) Hypertension: - Continue losartan 100 mg daily as tolerated - BP stable at present  #)Metastatic prostate cancer:  -Noted to be s/p prostatectomy in 2008.  #)Recurrent abdominal hernias:  -Currently without evidence of obstruction  # Constipation -Patient reports no BM in over one week prior to hospital visit -Very good results with suppository -Continue on miralax daily   Discharge Diagnoses:  Active Problems:   HTN (hypertension)   LUQ abdominal pain   Large hiatal hernia   Prostate cancer (HCC)   S/P prostatectomy   Prostate cancer metastatic to bone [facility])   Hydronephrosis of left kidney   Constipation    Discharge Instructions   Allergies as of 01/01/2018      Reactions   Aspirin Other (See Comments)   Ulcerated stomach   Azithromycin Shortness Of Breath   Cefuroxime Axetil Shortness Of Breath   Iodinated Diagnostic Agents Anaphylaxis   Iodine Other (See Comments)   Per pt tremors and dyspnea   Other Shortness Of Breath   Twist in esophagus so patient can not take large pills/capsules   Naproxen Other (See Comments)   REACTION: upset stomach - like with other NSAIDs      Medication List    TAKE these medications   losartan 100 MG tablet Commonly known as:  COZAAR Take 1 tablet (100 mg total) by mouth daily.   polyethylene glycol packet Commonly known as:  MIRALAX / GLYCOLAX Take 17 g by mouth daily. Start taking on:  01/02/2018  STOOL SOFTENER PO Take 1 tablet by mouth at bedtime.   traMADol 50 MG tablet Commonly known as:  ULTRAM Take 1-2 tablets (50-100 mg total) by mouth every 6 (six) hours as needed for moderate pain or severe pain (pain not relieved by Tylenol).    triamcinolone cream 0.5 % Commonly known as:  KENALOG Apply 1 application topically 4 (four) times daily.      Follow-up Information    Franchot Gallo, MD Follow up.   Specialty:  Urology Why:  We will call you to set up kidney procedure Contact information: [place] [address] [phone]        [name], [name], MD. Schedule an appointment as soon as possible for a visit in 2 week(s).   Specialty:  Internal Medicine Contact information: [place] [address] [phone]          Allergies  Allergen Reactions  . Aspirin Other (See Comments)    Ulcerated stomach  . Azithromycin Shortness Of Breath  . Cefuroxime Axetil Shortness Of Breath  . Iodinated Diagnostic Agents Anaphylaxis  . Iodine Other (See Comments)    Per pt tremors and dyspnea  . Other Shortness Of Breath    Twist in esophagus so patient can not take large pills/capsules  . Naproxen Other (See Comments)    REACTION: upset stomach - like with other NSAIDs    Consultations:  Urology  Procedures/Studies: Ct Abdomen Pelvis Wo Contrast  Result Date: 12/30/2017 CLINICAL DATA:  Acute left-sided abdominal pain. History of prostate cancer. EXAM: CT ABDOMEN AND PELVIS WITHOUT CONTRAST TECHNIQUE: Multidetector CT imaging of the abdomen and pelvis was performed following the standard protocol without IV contrast. COMPARISON:  CT scan of December 28, 2017. FINDINGS: Lower chest: Stable left basilar atelectasis is noted. Stable large hiatal hernia is noted which contains a portion of transverse colon, but does not result in incarceration or obstruction. Hepatobiliary: No focal liver abnormality is seen. No gallstones, gallbladder wall thickening, or biliary dilatation. Pancreas: Unremarkable. No pancreatic ductal dilatation or surrounding inflammatory changes. Spleen: Normal in size without focal abnormality. Adrenals/Urinary Tract: Adrenal glands appear normal. Right kidney  and ureter are unremarkable. Urinary bladder is unremarkable. Moderate left hydronephrosis is noted with moderate dilatation of the left renal pelvis and proximal left ureter with surrounding inflammation suggesting urinary tract infection. The middle and distal portions of the left ureter are normal in caliber and there is no evidence of obstructing calculus. Therefore, this most likely is due to ureteral stricture or scarring. Stomach/Bowel: There is no evidence of bowel obstruction. Diverticulosis of the colon is noted without inflammation. The appendix is not visualized. Vascular/Lymphatic: Aortic atherosclerosis. No enlarged abdominal or pelvic lymph nodes. Reproductive: Status post prostatectomy. Other: No abdominal wall hernia or abnormality. No abdominopelvic ascites. Musculoskeletal: Stable sclerotic lesions are seen involving both acetabuli, left greater than right. Stable sclerotic densities are noted in L3 consistent with metastatic disease. IMPRESSION: Moderate left hydronephrosis is noted with moderate dilatation of the left renal pelvis and proximal left ureter without obstructing calculus. This most likely is due to ureteral stricture or scarring. There is noted increased inflammatory changes around the left renal pelvis and proximal left ureter suggesting urinary tract infection. Stable large hiatal hernia is noted which also contains a portion of transverse colon, but does not result in incarceration or obstruction. Diverticulosis of the colon is noted without inflammation. Status post prostatectomy. Stable sclerotic lesions are noted in the pelvis and L3 vertebral body  concerning for metastatic disease. Aortic Atherosclerosis (ICD10-I70.0). Electronically Signed   By: Marijo Conception, M.D.   On: 12/30/2017 14:21   Ct Abdomen Pelvis Wo Contrast  Result Date: 12/28/2017 CLINICAL DATA:  Left lower quadrant pain EXAM: CT ABDOMEN AND PELVIS WITHOUT CONTRAST TECHNIQUE: Multidetector CT imaging of  the abdomen and pelvis was performed following the standard protocol without IV contrast. COMPARISON:  09/19/2017 FINDINGS: Lower chest: Large hiatal hernia is again identified with the majority of the stomach within the chest cavity. Some associated left atelectatic changes are noted similar to that seen on prior exams. Some multifocal scarring is noted in the right lower lobe stable from the previous exam. Adjacent to the pericardium best visualized on image number 16 of series 2 there is a focal 12 mm soft tissue nodular density which is stable from previous exams likely related to a small lymph node. Hepatobiliary: No focal liver abnormality is seen. No gallstones, gallbladder wall thickening, or biliary dilatation. Pancreas: Unremarkable. No pancreatic ductal dilatation or surrounding inflammatory changes. Spleen: Normal in size without focal abnormality. Adrenals/Urinary Tract: Adrenal glands are within normal limits. The right kidney is well visualize without focal abnormality. The left kidney demonstrates no renal calculi although hydronephrosis and hydroureter is noted. Caliber change is noted as the ureter crosses the left common iliac artery although no definitive calculus is seen at this time. The bladder is decompressed. Stomach/Bowel: Scattered diverticular change of the colon is noted without obstructive change. The splenic flexure lies within known hiatal hernia. No definitive obstructive changes are noted within the small bowel. The appendix is not visualized consistent with prior surgical history. Prior gastrostomy catheter tract is noted. Vascular/Lymphatic: Aortic atherosclerotic change is noted without aneurysmal dilatation. Scattered lymph nodes are again noted in the retrocrural region, periaortic region and left iliac chain stable from the prior exam. Again these are stable from the prior exam. Reproductive: Prostate has been surgically removed consistent with the given clinical history.  Other: No abdominal wall hernia or abnormality. No abdominopelvic ascites. Musculoskeletal: Degenerative changes of the lumbar spine are noted. Stable appearing sclerotic acetabular lesions left greater than right are noted. Stable sclerotic changes in the L3 vertebral body are again seen. These are consistent with the given clinical history of metastatic disease. IMPRESSION: Progressive left-sided hydronephrosis and proximal hydroureter. There is a caliber change as the ureter crosses over the common iliac artery on the left. These changes may be related to some scarring. This may be the etiology of the patient's underlying discomfort. Stable appearing retroperitoneal and left iliac adenopathy consistent with the given clinical history. Stable likely lymph node along the pericardial margin on the right is noted Stable appearing sclerotic bony metastatic disease. Large hiatal hernia stable in appearance from the prior exam. Electronically Signed   By: Inez Catalina M.D.   On: 12/28/2017 11:21   Nm Bone Scan Whole Body  Result Date: 12/06/2017 CLINICAL DATA:  Prostate cancer, PSA = 46.0 EXAM: NUCLEAR MEDICINE WHOLE BODY BONE SCAN TECHNIQUE: Whole body anterior and posterior images were obtained approximately 3 hours after intravenous injection of radiopharmaceutical. RADIOPHARMACEUTICALS:  21.9 mCi Technetium-12m MDP IV COMPARISON:  04/18/2017 Radiographic correlation: CT abdomen and pelvis 09/19/2017 FINDINGS: Multiple sites of abnormal increased osseous tracer accumulation are identified consistent with osseous metastatic disease. These include lower LEFT cervical spine, lower thoracic spine, lumbar spine, BILATERAL pelvis, and likely the posterior RIGHT eighth rib. Uptake in the pelvis and lumbar spine is progressive. Uptake at lower thoracic spine, RIGHT SI  joint, and posterior RIGHT eighth rib is new. Questionable focus of increased tracer localization at RIGHT lesser trochanter versus superimposed soft tissue  artifact, cannot exclude subtle proximal RIGHT femoral metastasis; no abnormality seen at this site on CT of 09/19/2017. New LEFT hydronephrosis and proximal hydroureter. Otherwise expected urinary tract and soft tissue distribution of tracer. IMPRESSION: Progressive osseous metastatic disease as above. Cannot exclude subtle metastatic lesion at the proximal RIGHT femur at the lesser trochanteric region; recommend radiographic correlation. Electronically Signed   By: Lavonia Dana M.D.   On: 12/06/2017 09:07   Dg Abd 2 Views  Result Date: 12/28/2017 CLINICAL DATA:  Abdominal pain. EXAM: ABDOMEN - 2 VIEW COMPARISON:  CT 12/28/2017.  KUB 09/16/2017. FINDINGS: Soft tissue structures are unremarkable. Prominent hiatal hernia again noted without interim change. Slightly distended loops of bowel show most likely colon with scattered air-fluid levels noted. An active process such as a diarrheal illness/colitis cannot be excluded. Adynamic ileus may be present. Follow-up exam to exclude developing obstruction suggested. No free air. Degenerative change lumbar spine. Ankylosis lumbar spine consistent with ankylosing spondylitis. Sclerotic changes noted about the left pubis and acetabulum, and right ilium. This could be from metastatic disease. This could be from a process such as Paget's disease. Reference made to prior CT report which suggests bony metastatic disease. No acute bony abnormality. IMPRESSION: 1. Slightly distended loops of bowel, most likely the colon. Scattered air-fluid levels noted. An active process such as a diarrheal illness/colitis cannot be excluded. Adynamic ileus may be present. Follow-up exam to exclude developing bowel obstruction suggested. No free air. Prominent hiatal hernia again noted without interim change. 2. Degenerative changes lumbar spine. Ankylosing spondylitis. Sclerotic changes left pubis and acetabulum, and right ilium. This could be from metastatic disease. This could be from a  process such as Paget's disease. Reference made to CT report which suggest bony metastatic disease. Electronically Signed   By: Marcello Moores  Register   On: 12/28/2017 15:16     Subjective: Eager to go home  Discharge Exam: Vitals:   12/31/17 2033 01/01/18 0418  BP: 136/76 (!) 156/79  Pulse: 72 82  Resp:  16  Temp: 99.4 F (37.4 C) 99.6 F (37.6 C)  SpO2: 94% 94%   Vitals:   12/31/17 0545 12/31/17 1543 12/31/17 2033 01/01/18 0418  BP: 112/60 124/67 136/76 (!) 156/79  Pulse: 80 73 72 82  Resp: 18 18  16   Temp: 99.1 F (37.3 C) 99.2 F (37.3 C) 99.4 F (37.4 C) 99.6 F (37.6 C)  TempSrc: Oral Oral Oral Oral  SpO2: 91% 93% 94% 94%  Weight:      Height:        General: Pt is alert, awake, not in acute distress Cardiovascular: RRR, S1/S2 +, no rubs, no gallops Respiratory: CTA bilaterally, no wheezing, no rhonchi Abdominal: Soft, NT, ND, bowel sounds + Extremities: no edema, no cyanosis   The results of significant diagnostics from this hospitalization (including imaging, microbiology, ancillary and laboratory) are listed below for reference.     Microbiology: No results found for this or any previous visit (from the past 240 hour(s)).   Labs: BNP (last 3 results) No results for input(s): BNP in the last 8760 hours. Basic Metabolic Panel: Recent Labs  Lab 12/28/17 0745 12/30/17 1254 12/31/17 0535 01/01/18 0526  NA 140 142 139 142  K 4.3 3.9 4.3 4.5  CL 108 107 108 110  CO2 23 24 25 26   GLUCOSE 128* 101* 122* 117*  BUN  12 18 20 17   CREATININE 1.08 1.04 1.33* 1.18  CALCIUM 9.3 9.5 8.3* 8.6*   Liver Function Tests: Recent Labs  Lab 12/28/17 0745 12/30/17 1254  AST 26 27  ALT 14 12  ALKPHOS 223* 199*  BILITOT 0.7 0.6  PROT 6.6 7.6  ALBUMIN 3.6 3.7   Recent Labs  Lab 12/28/17 0745 12/30/17 1254  LIPASE 26 25   No results for input(s): AMMONIA in the last 168 hours. CBC: Recent Labs  Lab 12/28/17 0745 12/30/17 1254 12/31/17 0535  WBC 9.8 14.2*  11.7*  NEUTROABS  --  11.7*  --   HGB 14.1 14.6 12.1*  HCT 42.0 43.6 38.2*  MCV 96.8 96.2 100.3*  PLT 222 242 237   Cardiac Enzymes: No results for input(s): CKTOTAL, CKMB, CKMBINDEX, TROPONINI in the last 168 hours. BNP: Invalid input(s): POCBNP CBG: No results for input(s): GLUCAP in the last 168 hours. D-Dimer No results for input(s): DDIMER in the last 72 hours. Hgb A1c No results for input(s): HGBA1C in the last 72 hours. Lipid Profile No results for input(s): CHOL, HDL, LDLCALC, TRIG, CHOLHDL, LDLDIRECT in the last 72 hours. Thyroid function studies No results for input(s): TSH, T4TOTAL, T3FREE, THYROIDAB in the last 72 hours.  Invalid input(s): FREET3 Anemia work up No results for input(s): VITAMINB12, FOLATE, FERRITIN, TIBC, IRON, RETICCTPCT in the last 72 hours. Urinalysis    Component Value Date/Time   COLORURINE YELLOW 12/30/2017 1254   APPEARANCEUR CLEAR 12/30/2017 1254   LABSPEC 1.009 12/30/2017 1254   PHURINE 8.0 12/30/2017 1254   GLUCOSEU NEGATIVE 12/30/2017 1254   GLUCOSEU NEGATIVE 11/19/2009 0802   HGBUR NEGATIVE 12/30/2017 1254   BILIRUBINUR NEGATIVE 12/30/2017 1254   KETONESUR 5 (A) 12/30/2017 1254   PROTEINUR NEGATIVE 12/30/2017 1254   UROBILINOGEN 0.2 11/19/2009 0802   NITRITE NEGATIVE 12/30/2017 1254   LEUKOCYTESUR NEGATIVE 12/30/2017 1254   Sepsis Labs Invalid input(s): PROCALCITONIN,  WBC,  LACTICIDVEN Microbiology No results found for this or any previous visit (from the past 240 hour(s)).  Time spent: 5min  SIGNED:   Marylu Lund, MD  [facility] 01/01/2018, 12:41 PM  If 7PM-7AM, please contact night-coverage

## 2018-01-02 ENCOUNTER — Encounter (HOSPITAL_COMMUNITY): Payer: Self-pay | Admitting: Emergency Medicine

## 2018-01-02 ENCOUNTER — Other Ambulatory Visit: Payer: Self-pay | Admitting: Urology

## 2018-01-02 ENCOUNTER — Other Ambulatory Visit: Payer: Self-pay

## 2018-01-02 ENCOUNTER — Emergency Department (HOSPITAL_COMMUNITY)
Admission: EM | Admit: 2018-01-02 | Discharge: 2018-01-02 | Disposition: A | Discharge status: Home / Self Care | Payer: Medicare Other | Attending: Emergency Medicine | Admitting: Emergency Medicine

## 2018-01-02 ENCOUNTER — Emergency Department (HOSPITAL_COMMUNITY): Payer: Medicare Other

## 2018-01-02 DIAGNOSIS — Z87891 Personal history of nicotine dependence: Secondary | ICD-10-CM | POA: Insufficient documentation

## 2018-01-02 DIAGNOSIS — K5900 Constipation, unspecified: Secondary | ICD-10-CM

## 2018-01-02 DIAGNOSIS — Z8546 Personal history of malignant neoplasm of prostate: Secondary | ICD-10-CM | POA: Diagnosis not present

## 2018-01-02 DIAGNOSIS — I10 Essential (primary) hypertension: Secondary | ICD-10-CM | POA: Insufficient documentation

## 2018-01-02 DIAGNOSIS — F419 Anxiety disorder, unspecified: Secondary | ICD-10-CM | POA: Diagnosis not present

## 2018-01-02 DIAGNOSIS — R52 Pain, unspecified: Secondary | ICD-10-CM | POA: Diagnosis not present

## 2018-01-02 DIAGNOSIS — R1032 Left lower quadrant pain: Secondary | ICD-10-CM | POA: Diagnosis not present

## 2018-01-02 LAB — BASIC METABOLIC PANEL
Anion gap: 11 (ref 5–15)
BUN: 13 mg/dL (ref 8–23)
CO2: 23 mmol/L (ref 22–32)
Calcium: 9.3 mg/dL (ref 8.9–10.3)
Chloride: 109 mmol/L (ref 98–111)
Creatinine, Ser: 0.95 mg/dL (ref 0.61–1.24)
GFR calc non Af Amer: >60 mL/min (ref >60)
Glucose, Bld: 127 mg/dL — ABNORMAL HIGH (ref 70–99)
POTASSIUM: 3.9 mmol/L (ref 3.5–5.1)
Sodium: 143 mmol/L (ref 135–145)

## 2018-01-02 LAB — URINALYSIS, ROUTINE W REFLEX MICROSCOPIC
BILIRUBIN URINE: NEGATIVE
Bacteria, UA: NONE SEEN
Glucose, UA: NEGATIVE mg/dL
Ketones, ur: 20 mg/dL — AB
LEUKOCYTES UA: NEGATIVE
NITRITE: NEGATIVE
PROTEIN: NEGATIVE mg/dL
Specific Gravity, Urine: 1.011 (ref 1.005–1.030)
pH: 6 (ref 5.0–8.0)

## 2018-01-02 LAB — CBC WITH DIFFERENTIAL/PLATELET
BASOS PCT: 0 %
Basophils Absolute: 0 10*3/uL (ref 0.0–0.1)
Eosinophils Absolute: 0 10*3/uL (ref 0.0–0.7)
Eosinophils Relative: 0 %
HCT: 41.4 % (ref 39.0–52.0)
Hemoglobin: 13.5 g/dL (ref 13.0–17.0)
LYMPHS ABS: 0.9 10*3/uL (ref 0.7–4.0)
Lymphocytes Relative: 9 %
MCH: 31.8 pg (ref 26.0–34.0)
MCHC: 32.6 g/dL (ref 30.0–36.0)
MCV: 97.6 fL (ref 78.0–100.0)
Monocytes Absolute: 0.9 10*3/uL (ref 0.1–1.0)
Monocytes Relative: 8 %
NEUTROS PCT: 83 %
Neutro Abs: 8.7 10*3/uL — ABNORMAL HIGH (ref 1.7–7.7)
Platelets: 270 10*3/uL (ref 150–400)
RBC: 4.24 MIL/uL (ref 4.22–5.81)
RDW: 13.3 % (ref 11.5–15.5)
WBC: 10.5 10*3/uL (ref 4.0–10.5)

## 2018-01-02 MED ORDER — PEG 3350-KCL-NABCB-NACL-NASULF 236 G PO SOLR: 4000.0000 mL | Freq: Once | ORAL | 0 refills | Status: AC

## 2018-01-02 MED ORDER — FLEET ENEMA 7-19 GM/118ML RE ENEM
1.0000 | ENEMA | Freq: Once | RECTAL | Status: AC
  Administered 2018-01-02: 1 via RECTAL
  Filled 2018-01-02: qty 1

## 2018-01-02 MED ORDER — MINERAL OIL RE ENEM
1.0000 | ENEMA | Freq: Once | RECTAL | Status: AC
  Administered 2018-01-02: 1 via RECTAL
  Filled 2018-01-02: qty 1

## 2018-01-02 MED ORDER — SODIUM CHLORIDE 0.9 % IV SOLN
Freq: Once | INTRAVENOUS | Status: AC
  Administered 2018-01-02: 125 mL/h via INTRAVENOUS

## 2018-01-02 MED ORDER — HYDRALAZINE HCL 20 MG/ML IJ SOLN
5.0000 mg | Freq: Once | INTRAMUSCULAR | Status: AC
  Administered 2018-01-02: 5 mg via INTRAVENOUS
  Filled 2018-01-02: qty 1

## 2018-01-02 MED ORDER — ONDANSETRON HCL 4 MG/2ML IJ SOLN
4.0000 mg | Freq: Once | INTRAMUSCULAR | Status: AC
  Administered 2018-01-02: 4 mg via INTRAVENOUS
  Filled 2018-01-02: qty 2

## 2018-01-02 MED ORDER — BISACODYL 10 MG RE SUPP
10.0000 mg | Freq: Once | RECTAL | Status: AC
  Administered 2018-01-02: 10 mg via RECTAL
  Filled 2018-01-02: qty 1

## 2018-01-02 MED ORDER — MORPHINE SULFATE (PF) 4 MG/ML IV SOLN
4.0000 mg | Freq: Once | INTRAVENOUS | Status: AC
  Administered 2018-01-02: 4 mg via INTRAVENOUS
  Filled 2018-01-02: qty 1

## 2018-01-02 NOTE — Discharge Instructions (Signed)
Take golytely and drink plenty of water. You will have bowel movements afterwards.   Take miralax daily afterwards until you have soft bowel movements for a week.   See GI for follow up   Return to ER if you have worse abdominal pain, vomiting, fever

## 2018-01-02 NOTE — ED Notes (Signed)
Bed: PB22 Expected date:  Expected time:  Means of arrival:  Comments: 84 yo M/Abd pain

## 2018-01-02 NOTE — ED Triage Notes (Signed)
Patient brought in by [facility] from home. Patient is complaining of left flank that radiates to hip that started seven days ago. Patient has not had a bowel movement Saturday. Patient was here 12/30/2017 and patient was diagnosed with left hydronephrosis. Patient was suppose to follow up with urology for a kidney procedure. Patient requested morphine but EMS did not give him anything.

## 2018-01-02 NOTE — ED Notes (Signed)
Patient states he was released from the hospital this morning and the pain medication is not helping his pain.

## 2018-01-02 NOTE — ED Provider Notes (Signed)
  Physical Exam  BP (!) 168/88   Pulse (!) 105   Temp 99 F (37.2 C) (Oral)   Resp 18   Ht 6' 1.5" (1.867 m)   Wt 93.4 kg   SpO2 94%   BMI 26.80 kg/m   Physical Exam  ED Course/Procedures     Procedures  MDM  Care assumed at 7 am. Patient recently admitted and had L hydro and constipation. Urology states that no intervention needed and patient discharged yesterday. Patient came back yesterday for persistent LLQ pain. Sign out pending labs, UA, abdominal xray.   10:21 AM WBC nl. UA nl. Xray showed constipation. Given enema x 2 and had several bowel movements. Felt slightly better. Told him to take miralax. Patient was anxious about his constipation. Will prescribe GoLytely. Will have him follow up with GI.       [name], MD 01/02/18 1022

## 2018-01-02 NOTE — ED Notes (Signed)
Patient given Macy 2L O2 sats 86

## 2018-01-02 NOTE — ED Provider Notes (Signed)
[place] DEPT Provider Note: Georgena Spurling, MD, FACEP  CSN: [id number] MRN: [id number] ARRIVAL: 01/02/18 at [place]: [place]  Flank Pain   HISTORY OF PRESENT ILLNESS  01/02/18 5:24 AM Jorge [name] is a 84 y.o. male with a history of prostate cancer metastatic to bone.  He was admitted to the hospital on the 28th for left lower quadrant abdominal pain; he had previously been seen in the ED on the 26th as well..  The pain was sharp and nonradiating.  It was constant in nature.  Work-up showed left hydronephrosis without an obstructing stone as well as constipation.  He was discharged yesterday.  The pain was thought to be due to constipation.  Urology saw him and did not believe ureteral stenting was indicated.  He states he was given a suppository and had one large bowel movement; hospital records indicate he was given MiraLAX as well.   He returns complaining of persistent pain.  He localizes the pain in the left flank along the mid axillary line just above his left iliac crest.  It is worse with palpation or movement.  He feels like he is not fully voided his bowels.  He is also been nauseated with retching but no frank vomiting.  He continues to have a decreased appetite.  He states the pain is moderate to severe and he is requesting something for the pain.  He states the pain does radiate to the left groin fold.    Past Medical History:  Diagnosis Date  . Anxiety   . ED (erectile dysfunction)   . Gastric ulcer    Dr. Ardis Hughs  . GERD (gastroesophageal reflux disease)   . Hernia, hiatal   . History of colonic polyps   . Hx of radiation therapy   . Hydronephrosis of left kidney 12/30/2017  . Hypertension   . Insomnia   . Prostate cancer [facility]) 2010   Dr. Luberta Robertson  . Prostate cancer metastatic to bone ([place]) 12/30/2017  . S/P prostatectomy 12/30/2017  . Syncope 2011   from Bicalutamide  . Vitamin D deficiency     Past Surgical History:  Procedure  Laterality Date  . APPENDECTOMY     age 22  . CYST REMOVAL NECK N/A 10/16/2014   Procedure: EXCISION CYST POSTERIOR NECK;  Surgeon: Autumn Messing III, MD;  Location: [place];  Service: General;  Laterality: N/A;  posterior  . ESOPHAGOGASTRODUODENOSCOPY  06/05/2011   Procedure: ESOPHAGOGASTRODUODENOSCOPY (EGD);  Surgeon: Gatha Mayer, MD;  Location[facility]COPY;  Service: Endoscopy;  Laterality: N/A;  . ESOPHAGOGASTRODUODENOSCOPY N/A 09/16/2017   Procedure: ESOPHAGOGASTRODUODENOSCOPY (EGD);  Surgeon: Carol Ada, MD;  Location: [facility];  Service: Endoscopy;  Laterality: N/A;  . GASTROSTOMY N/A 09/20/2017   Procedure: INSERTION OF GASTROSTOMY TUBE;  Surgeon: Stark Klein, MD;  Location: [facility];  Service: General;  Laterality: N/A;  . HERNIA REPAIR  06/10/2011      . HIATAL HERNIA REPAIR  06/10/2011   Procedure: LAPAROSCOPIC REPAIR OF HIATAL HERNIA;  Surgeon: Pedro Earls, MD;  Location: [place];  Service: General;  Laterality: N/A;  Laparoscopic repair of paraesophageal hernia.  [name] PROSTATECTOMY  2008  . TRANSURETHRAL RESECTION OF PROSTATE      Family History  Problem Relation Age of Onset  . Pancreatic cancer Father   . Hypertension Father   . Cancer Father   . Hypertension Other     Social History   Tobacco Use  . Smoking status:  Former Smoker  . Smokeless tobacco: Never Used  Substance Use Topics  . Alcohol use: No    Comment: social  . Drug use: No    Prior to Admission medications   Medication Sig Start Date End Date Taking? Authorizing Provider  Docusate Calcium (STOOL SOFTENER PO) Take 1 tablet by mouth at bedtime.    Yes [provider]  losartan (COZAAR) 100 MG tablet Take 1 tablet (100 mg total) by mouth daily. 04/05/17  Yes [name], [name], MD  polyethylene glycol (MIRALAX / GLYCOLAX) packet Take 17 g by mouth daily. 01/02/18  Yes Donne Hazel, MD  traMADol (ULTRAM) 50 MG tablet Take 1-2 tablets (50-100 mg total) by mouth every 6  (six) hours as needed for moderate pain or severe pain (pain not relieved by Tylenol). 12/28/17  Yes Davonna Belling, MD  triamcinolone cream (KENALOG) 0.5 % Apply 1 application topically 4 (four) times daily. 12/22/17 12/22/18 Yes [name], [name], MD    Allergies Aspirin; Azithromycin; Cefuroxime axetil; Iodinated diagnostic agents; Iodine; Other; and Naproxen   REVIEW OF SYSTEMS  Negative except as noted here or in the History of Present Illness.   PHYSICAL EXAMINATION  Initial Vital Signs Blood pressure (!) 211/116, pulse 79, temperature 99 F (37.2 C), temperature source Oral, resp. rate (!) 21, height 6' 1.5" (1.867 m), weight 93.4 kg, SpO2 94 %.  Examination General: Well-developed, well-nourished male in no acute distress; appearance consistent with age of record HENT: normocephalic; atraumatic Eyes: pupils equal, round and reactive to light; extraocular muscles intact Neck: supple Heart: regular rate and rhythm Lungs: clear to auscultation bilaterally Abdomen: soft; nondistended; left lower quadrant and left lateral tenderness; partially healed gastrostomy left upper quadrant; bowel sounds present Extremities: No deformity; full range of motion; pulses normal; trace edema of lower legs Neurologic: Awake, alert and oriented; motor function intact in all extremities and symmetric; no facial droop Skin: Warm and dry Psychiatric: Normal mood and affect   RESULTS  Summary of this visit's results, reviewed by myself:   EKG Interpretation  Date/Time:    Ventricular Rate:    PR Interval:    QRS Duration:   QT Interval:    QTC Calculation:   R Axis:     Text Interpretation:        Laboratory Studies: Results for orders placed or performed during the hospital encounter of 01/02/18 (from the past 24 hour(s))  CBC with Differential/Platelet     Status: Abnormal   Collection Time: 01/02/18  6:24 AM  Result Value Ref Range   WBC 10.5 4.0 - 10.5 K/uL   RBC 4.24 4.22  - 5.81 MIL/uL   Hemoglobin 13.5 13.0 - 17.0 g/dL   HCT 41.4 39.0 - 52.0 %   MCV 97.6 78.0 - 100.0 fL   MCH 31.8 26.0 - 34.0 pg   MCHC 32.6 30.0 - 36.0 g/dL   RDW 13.3 11.5 - 15.5 %   Platelets 270 150 - 400 K/uL   Neutrophils Relative % 83 %   Neutro Abs 8.7 (H) 1.7 - 7.7 K/uL   Lymphocytes Relative 9 %   Lymphs Abs 0.9 0.7 - 4.0 K/uL   Monocytes Relative 8 %   Monocytes Absolute 0.9 0.1 - 1.0 K/uL   Eosinophils Relative 0 %   Eosinophils Absolute 0.0 0.0 - 0.7 K/uL   Basophils Relative 0 %   Basophils Absolute 0.0 0.0 - 0.1 K/uL   Imaging Studies: Dg Abdomen 1 View  Result Date: 01/02/2018 CLINICAL  DATA:  LEFT lower quadrant pain for 3 weeks radiating to groin. EXAM: ABDOMEN - 1 VIEW COMPARISON:  CT abdomen and pelvis December 30, 2017 FINDINGS: The bowel gas pattern is normal. Moderate amount of retained large bowel stool, contrast in the colon. Colon within large LEFT diaphragmatic hernia. Small metallic screws project RIGHT lung base. IMPRESSION: Moderate retained large bowel stool, residual enteric contrast. Nonobstructive bowel gas pattern. Electronically Signed   By: Elon Alas M.D.   On: 01/02/2018 05:57    ED COURSE and MDM  Nursing notes and initial vitals signs, including pulse oximetry, reviewed.  Vitals:   01/02/18 0253 01/02/18 0254  BP: (!) 211/116   Pulse: 79   Resp: (!) 21   Temp: 99 F (37.2 C)   TempSrc: Oral   SpO2: 94%   Weight:  93.4 kg  Height:  6' 1.5" (1.867 m)   Dr. Darl Householder will follow up on patient and make disposition  PROCEDURES    ED DIAGNOSES     ICD-10-CM   1. LLQ abdominal pain R10.32   2. Constipation in male K59.00        Arby Dahir, MD 01/02/18 0700

## 2018-01-03 ENCOUNTER — Other Ambulatory Visit: Payer: Self-pay

## 2018-01-03 ENCOUNTER — Encounter (HOSPITAL_COMMUNITY): Payer: Self-pay | Admitting: *Deleted

## 2018-01-04 ENCOUNTER — Ambulatory Visit (HOSPITAL_COMMUNITY): Payer: Medicare Other | Admitting: Anesthesiology

## 2018-01-04 ENCOUNTER — Encounter (HOSPITAL_COMMUNITY)
Admission: RE | Disposition: A | Discharge status: Home / Self Care | Payer: Self-pay | Source: Ambulatory Visit | Attending: Urology

## 2018-01-04 ENCOUNTER — Encounter (HOSPITAL_COMMUNITY): Payer: Self-pay | Admitting: *Deleted

## 2018-01-04 ENCOUNTER — Ambulatory Visit (HOSPITAL_COMMUNITY)
Admission: RE | Admit: 2018-01-04 | Discharge: 2018-01-04 | Disposition: A | Discharge status: Home / Self Care | Payer: Medicare Other | Source: Ambulatory Visit | Attending: Urology | Admitting: Urology

## 2018-01-04 ENCOUNTER — Other Ambulatory Visit: Payer: Self-pay

## 2018-01-04 ENCOUNTER — Ambulatory Visit (HOSPITAL_COMMUNITY): Payer: Medicare Other

## 2018-01-04 DIAGNOSIS — I10 Essential (primary) hypertension: Secondary | ICD-10-CM | POA: Insufficient documentation

## 2018-01-04 DIAGNOSIS — Z881 Allergy status to other antibiotic agents status: Secondary | ICD-10-CM | POA: Diagnosis not present

## 2018-01-04 DIAGNOSIS — Z886 Allergy status to analgesic agent status: Secondary | ICD-10-CM | POA: Diagnosis not present

## 2018-01-04 DIAGNOSIS — C969 Malignant neoplasm of lymphoid, hematopoietic and related tissue, unspecified: Secondary | ICD-10-CM | POA: Diagnosis not present

## 2018-01-04 DIAGNOSIS — K219 Gastro-esophageal reflux disease without esophagitis: Secondary | ICD-10-CM | POA: Insufficient documentation

## 2018-01-04 DIAGNOSIS — N1330 Unspecified hydronephrosis: Secondary | ICD-10-CM | POA: Insufficient documentation

## 2018-01-04 DIAGNOSIS — C7951 Secondary malignant neoplasm of bone: Secondary | ICD-10-CM | POA: Diagnosis not present

## 2018-01-04 DIAGNOSIS — Z79899 Other long term (current) drug therapy: Secondary | ICD-10-CM | POA: Insufficient documentation

## 2018-01-04 DIAGNOSIS — C61 Malignant neoplasm of prostate: Secondary | ICD-10-CM | POA: Insufficient documentation

## 2018-01-04 DIAGNOSIS — Z91041 Radiographic dye allergy status: Secondary | ICD-10-CM | POA: Diagnosis not present

## 2018-01-04 DIAGNOSIS — Z87891 Personal history of nicotine dependence: Secondary | ICD-10-CM | POA: Insufficient documentation

## 2018-01-04 DIAGNOSIS — R591 Generalized enlarged lymph nodes: Secondary | ICD-10-CM | POA: Diagnosis not present

## 2018-01-04 DIAGNOSIS — Z888 Allergy status to other drugs, medicaments and biological substances status: Secondary | ICD-10-CM | POA: Diagnosis not present

## 2018-01-04 HISTORY — PX: CYSTOSCOPY W/ RETROGRADES: SHX1426

## 2018-01-04 SURGERY — CYSTOSCOPY, WITH RETROGRADE PYELOGRAM
Anesthesia: General | Laterality: Left

## 2018-01-04 MED ORDER — LACTATED RINGERS IV SOLN
INTRAVENOUS | Status: DC
  Administered 2018-01-04: 13:00:00 via INTRAVENOUS

## 2018-01-04 MED ORDER — FENTANYL CITRATE (PF) 100 MCG/2ML IJ SOLN
INTRAMUSCULAR | Status: DC | PRN
  Administered 2018-01-04: 50 ug via INTRAVENOUS

## 2018-01-04 MED ORDER — ALBUMIN HUMAN 5 % IV SOLN
INTRAVENOUS | Status: AC
  Filled 2018-01-04: qty 250

## 2018-01-04 MED ORDER — PROMETHAZINE HCL 25 MG/ML IJ SOLN: 6.2500 mg | INTRAMUSCULAR | Status: DC | PRN

## 2018-01-04 MED ORDER — LIDOCAINE HCL (CARDIAC) PF 100 MG/5ML IV SOSY
PREFILLED_SYRINGE | INTRAVENOUS | Status: DC | PRN
  Administered 2018-01-04: 100 mg via INTRAVENOUS

## 2018-01-04 MED ORDER — CIPROFLOXACIN IN D5W 400 MG/200ML IV SOLN
400.0000 mg | INTRAVENOUS | Status: AC
  Administered 2018-01-04: 400 mg via INTRAVENOUS
  Filled 2018-01-04: qty 200

## 2018-01-04 MED ORDER — DEXAMETHASONE SODIUM PHOSPHATE 10 MG/ML IJ SOLN
INTRAMUSCULAR | Status: DC | PRN
  Administered 2018-01-04: 10 mg via INTRAVENOUS

## 2018-01-04 MED ORDER — FENTANYL CITRATE (PF) 100 MCG/2ML IJ SOLN: 25.0000 ug | INTRAMUSCULAR | Status: DC | PRN

## 2018-01-04 MED ORDER — SUCCINYLCHOLINE CHLORIDE 200 MG/10ML IV SOSY
PREFILLED_SYRINGE | INTRAVENOUS | Status: DC | PRN
  Administered 2018-01-04: 140 mg via INTRAVENOUS

## 2018-01-04 MED ORDER — PROPOFOL 10 MG/ML IV BOLUS
INTRAVENOUS | Status: AC
  Filled 2018-01-04: qty 20

## 2018-01-04 MED ORDER — FENTANYL CITRATE (PF) 100 MCG/2ML IJ SOLN
INTRAMUSCULAR | Status: AC
  Filled 2018-01-04: qty 2

## 2018-01-04 MED ORDER — SODIUM CHLORIDE 0.9 % IR SOLN
Status: DC | PRN
  Administered 2018-01-04: 3000 mL via INTRAVESICAL

## 2018-01-04 MED ORDER — PROPOFOL 10 MG/ML IV BOLUS
INTRAVENOUS | Status: DC | PRN
  Administered 2018-01-04: 140 mg via INTRAVENOUS

## 2018-01-04 SURGICAL SUPPLY — 20 items
BAG URO CATCHER STRL LF (MISCELLANEOUS) ×2 IMPLANT
BASKET LASER NITINOL 1.9FR (BASKET) ×2 IMPLANT
BASKET ZERO TIP NITINOL 2.4FR (BASKET) IMPLANT
CATH INTERMIT  6FR 70CM (CATHETERS) IMPLANT
CLOTH BEACON ORANGE TIMEOUT ST (SAFETY) ×2 IMPLANT
FIBER LASER FLEXIVA 365 (UROLOGICAL SUPPLIES) IMPLANT
FIBER LASER TRAC TIP (UROLOGICAL SUPPLIES) IMPLANT
GLOVE BIOGEL M 8.0 STRL (GLOVE) ×2 IMPLANT
GOWN STRL REUS W/ TWL XL LVL3 (GOWN DISPOSABLE) ×1 IMPLANT
GOWN STRL REUS W/TWL LRG LVL3 (GOWN DISPOSABLE) ×4 IMPLANT
GOWN STRL REUS W/TWL XL LVL3 (GOWN DISPOSABLE) ×1
GUIDEWIRE ANG ZIPWIRE 038X150 (WIRE) ×2 IMPLANT
GUIDEWIRE STR DUAL SENSOR (WIRE) ×2 IMPLANT
IV NS 1000ML (IV SOLUTION) ×1
IV NS 1000ML BAXH (IV SOLUTION) ×1 IMPLANT
MANIFOLD NEPTUNE II (INSTRUMENTS) ×2 IMPLANT
PACK CYSTO (CUSTOM PROCEDURE TRAY) ×2 IMPLANT
STENT CONTOUR 6FRX26X.038 (STENTS) ×2 IMPLANT
TUBING CONNECTING 10 (TUBING) ×2 IMPLANT
TUBING UROLOGY SET (TUBING) ×2 IMPLANT

## 2018-01-04 NOTE — H&P (Signed)
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  This was first seen on recent bone scan to follow-up his disease.  He presented to the emergency room a few days ago with left flank pain.  This was felt to be due to his hydronephrosis, but the patient's pain dissipated after a bowel movement.  Because of his left hydronephrosis, secondary to retroperitoneal adenopathy, he presents this time for cystoscopy and stent placement.  Past Medical History:  Diagnosis Date  . Anxiety   . ED (erectile dysfunction)   . Gastric ulcer    Dr. Ardis Hughs  . GERD (gastroesophageal reflux disease)   . Hernia, hiatal   . History of colonic polyps   . Hx of radiation therapy   . Hydronephrosis of left kidney 12/30/2017  . Hypertension   . Insomnia   . Prostate cancer [facility]) 2010   Dr. Luberta Robertson  . Prostate cancer metastatic to bone ([place]) 12/30/2017  . S/P prostatectomy 12/30/2017  . Syncope 2011   from Bicalutamide  . Vitamin D deficiency     Past Surgical History:  Procedure Laterality Date  . APPENDECTOMY     age 89  . CYST REMOVAL NECK N/A 10/16/2014   Procedure: EXCISION CYST POSTERIOR NECK;  Surgeon: Autumn Messing III, MD;  Location: [place];  Service: General;  Laterality: N/A;  posterior  . ESOPHAGOGASTRODUODENOSCOPY  06/05/2011   Procedure: ESOPHAGOGASTRODUODENOSCOPY (EGD);  Surgeon: Gatha Mayer, MD;  Location: [facility] ENDOSCOPY;  Service: Endoscopy;  Laterality: N/A;  . ESOPHAGOGASTRODUODENOSCOPY N/A 09/16/2017   Procedure: ESOPHAGOGASTRODUODENOSCOPY (EGD);  Surgeon: Carol Ada, MD;  Location[facility];  Service: Endoscopy;  Laterality: N/A;  . GASTROSTOMY N/A 09/20/2017   Procedure: INSERTION OF GASTROSTOMY TUBE;  Surgeon: Stark Klein, MD;  Location[facility];  Service: General;  Laterality: N/A;  . HERNIA REPAIR   06/10/2011      . HIATAL HERNIA REPAIR  06/10/2011   Procedure: LAPAROSCOPIC REPAIR OF HIATAL HERNIA;  Surgeon: Pedro Earls, MD;  Location: [place];  Service: General;  Laterality: N/A;  Laparoscopic repair of paraesophageal hernia.  [name] PROSTATECTOMY  2008  . TRANSURETHRAL RESECTION OF PROSTATE      Home Medications:  Allergies as of 01/04/2018      Reactions   Aspirin Other (See Comments)   Ulcerated stomach   Azithromycin Shortness Of Breath   Cefuroxime Axetil Shortness Of Breath   Iodinated Diagnostic Agents Anaphylaxis   Iodine Other (See Comments)   Per pt tremors and dyspnea   Other Shortness Of Breath   Twist in esophagus so patient can not take large pills/capsules   Naproxen Other (See Comments)   REACTION: upset stomach - like with other NSAIDs      Medication List    Notice   Cannot display discharge medications because the patient has not yet been admitted.     Allergies:  Allergies  Allergen Reactions  . Aspirin Other (See Comments)    Ulcerated stomach  . Azithromycin Shortness Of Breath  . Cefuroxime Axetil Shortness Of Breath  . Iodinated Diagnostic Agents Anaphylaxis  . Iodine Other (See Comments)    Per pt tremors and dyspnea  . Other Shortness Of Breath    Twist in esophagus so patient can not take large pills/capsules  . Naproxen Other (See Comments)    REACTION: upset stomach - like with other  NSAIDs    Family History  Problem Relation Age of Onset  . Pancreatic cancer Father   . Hypertension Father   . Cancer Father   . Hypertension Other     Social History:  reports that he has quit smoking. He has never used smokeless tobacco. He reports that he does not drink alcohol or use drugs.  ROS: A complete review of systems was performed.  All systems are negative except for pertinent findings as noted.  Physical Exam:  Vital signs in last 24 hours:   Constitutional:  Alert and oriented, No acute distress Cardiovascular: Regular rate   Respiratory: Normal respiratory effort GI: Abdomen is soft, nontender, nondistended, no abdominal masses Genitourinary: No CVAT. Normal male phallus, testes are descended bilaterally and non-tender and without masses, scrotum is normal in appearance without lesions or masses, perineum is normal on inspection. Lymphatic: No lymphadenopathy Neurologic: Grossly intact, no focal deficits Psychiatric: Normal mood and affect  Laboratory Data:  Recent Labs    01/02/18 0624  WBC 10.5  HGB 13.5  HCT 41.4  PLT 270    Recent Labs    01/02/18 0624  NA 143  K 3.9  CL 109  GLUCOSE 127*  BUN 13  CALCIUM 9.3  CREATININE 0.95     No results found for this or any previous visit (from the past 24 hour(s)). No results found for this or any previous visit (from the past 240 hour(s)).  Renal Function: Recent Labs    12/30/17 1254 12/31/17 0535 01/01/18 0526 01/02/18 0624  CREATININE 1.04 1.33* 1.18 0.95   Estimated Creatinine Clearance: 64 mL/min (by C-G formula based on SCr of 0.95 mg/dL).  Radiologic Imaging: No results found.  Impression/Assessment:  Left hydronephrosis secondary to metastatic lymphadenopathy  Plan:  Cystoscopy, left retrograde ureteropyelogram, left double-J stent placement

## 2018-01-04 NOTE — Interval H&P Note (Signed)
History and Physical Interval Note:  01/04/2018 2:36 PM  Jorge [name]  has presented today for surgery, with the diagnosis of LEFT HYDRONEPHROSIS  The various methods of treatment have been discussed with the patient and family. After consideration of risks, benefits and other options for treatment, the patient has consented to  Procedure(s): CYSTOSCOPY WITH RETROGRADE PYELOGRAM (Left) as a surgical intervention .  The patient's history has been reviewed, patient examined, no change in status, stable for surgery.  I have reviewed the patient's chart and labs.  Questions were answered to the patient's satisfaction.     Lillette Boxer Dalilah Curlin

## 2018-01-04 NOTE — Anesthesia Preprocedure Evaluation (Addendum)
Anesthesia Evaluation  Patient identified by MRN, date of birth, ID band Patient awake    Reviewed: Allergy & Precautions, NPO status , Patient's Chart, lab work & pertinent test results  Airway Mallampati: II  TM Distance: >3 FB Neck ROM: Full    Dental  (+) Edentulous Upper, Edentulous Lower   Pulmonary neg pulmonary ROS, former smoker,    Pulmonary exam normal breath sounds clear to auscultation       Cardiovascular hypertension, Normal cardiovascular exam Rhythm:Regular Rate:Normal     Neuro/Psych negative neurological ROS  negative psych ROS   GI/Hepatic Neg liver ROS, hiatal hernia, GERD  ,  Endo/Other  negative endocrine ROS  Renal/GU negative Renal ROSMetastatic prostate ca  negative genitourinary   Musculoskeletal negative musculoskeletal ROS (+)   Abdominal   Peds negative pediatric ROS (+)  Hematology negative hematology ROS (+)   Anesthesia Other Findings   Reproductive/Obstetrics negative OB ROS                            Anesthesia Physical Anesthesia Plan  ASA: IV  Anesthesia Plan: General   Post-op Pain Management:    Induction: Intravenous  PONV Risk Score and Plan: 2 and Ondansetron, Dexamethasone and Treatment may vary due to age or medical condition  Airway Management Planned: Oral ETT  Additional Equipment:   Intra-op Plan:   Post-operative Plan: Extubation in OR  Informed Consent: I have reviewed the patients History and Physical, chart, labs and discussed the procedure including the risks, benefits and alternatives for the proposed anesthesia with the patient or authorized representative who has indicated his/her understanding and acceptance.   Dental advisory given  Plan Discussed with: CRNA and Surgeon  Anesthesia Plan Comments:        Anesthesia Quick Evaluation

## 2018-01-04 NOTE — Anesthesia Postprocedure Evaluation (Signed)
Anesthesia Post Note  Patient: Jorge [name]  Procedure(s) Performed: CYSTOSCOPY LEFT STENT PLACEMENT (Left )     Patient location during evaluation: PACU Anesthesia Type: General Level of consciousness: awake and alert Pain management: pain level controlled Vital Signs Assessment: post-procedure vital signs reviewed and stable Respiratory status: spontaneous breathing, nonlabored ventilation, respiratory function stable and patient connected to nasal cannula oxygen Cardiovascular status: blood pressure returned to baseline and stable Postop Assessment: no apparent nausea or vomiting Anesthetic complications: no    Last Vitals:  Vitals:   01/04/18 1545 01/04/18 1606  BP: (!) 174/93 (!) 187/93  Pulse: 70 72  Resp: (!) 22 18  Temp: 36.9 C 36.6 C  SpO2: 95% 97%    Last Pain:  Vitals:   01/04/18 1545  TempSrc:   PainSc: 0-No pain                 Danille Oppedisano S

## 2018-01-04 NOTE — Transfer of Care (Signed)
Immediate Anesthesia Transfer of Care Note  Patient: Jorge [name]  Procedure(s) Performed: CYSTOSCOPY LEFT STENT PLACEMENT (Left )  Patient Location: PACU  Anesthesia Type:General  Level of Consciousness: awake, alert  and oriented  Airway & Oxygen Therapy: Patient Spontanous Breathing and Patient connected to face mask oxygen  Post-op Assessment: Report given to RN and Post -op Vital signs reviewed and stable  Post vital signs: Reviewed and stable  Last Vitals:  Vitals Value Taken Time  BP 180/93 01/04/2018  3:19 PM  Temp    Pulse 75 01/04/2018  3:21 PM  Resp 23 01/04/2018  3:21 PM  SpO2 100 % 01/04/2018  3:21 PM  Vitals shown include unvalidated device data.  Last Pain:  Vitals:   01/04/18 1255  TempSrc: Oral  PainSc: 0-No pain         Complications: No apparent anesthesia complications

## 2018-01-04 NOTE — Op Note (Signed)
Preoperative diagnosis: Left hydronephrosis secondary to malignant lymphadenopathy from prostate cancer  Postoperative diagnosis: Same  [place] procedure: Cystoscopy, placement of 6 French by 26 cm contour double-J stent without tether  Surgeon: Angelina Neece  Anesthesia: General with LMA  Complications: None  Estimated blood loss: None  Specimen: None  Drains: Above mentioned stent  Indications: 84 year old male with progressive prostate cancer, now castrate resistant.  He recently had a bone scan which revealed osseous bone metastatic progression as well as new onset left hydronephrosis.  This was verified by CT scan recently performed for abdominal pain which was most likely secondary to constipation which has been quite an issue for this man.  He will be more than likely under 1 second line therapy in the near future, and presents at this time for cystoscopy and placement of a left double-J stent to provide adequate drainage of his left renal unit.  I discussed the procedure with the patient who desires to proceed.  Description of procedure: The patient was properly identified in the holding area.  He received preoperative IV antibiotics.  His surgical side was marked.  He was taken to the operating room where general anesthetic was administered with the LMA.  He was placed in the dorsolithotomy position, genitalia and perineum were prepped and draped.  Proper timeout was performed.  75 French panendoscope was advanced into his bladder.  Prostate was significant for a wide open fossa from prior suprapubic prostatectomy.  There were no lesions within the prostate or in the bladder.  The left ureteral orifice was identified.  Because he has an anaphylactic reaction to iodinated contrast, I did not perform retrograde study.  I advanced a sensor tip guidewire quite easily up in his left renal unit, with a curl seen in the upper pole calyx, more than likely.  Over top of this I used fluoroscopic and  cystoscopic guidance to adequately position the 26 cm 6 French contour double-J stent.  Once adequate positioning was noted, it was deployed within the ureter by removing the guidewire with excellent proximal and distal curl seen.  At this point the bladder was drained.  The scope was removed and the procedure terminated.  Patient tolerated the procedure well.  He is awakened and then taken to the PACU in stable condition.

## 2018-01-04 NOTE — Discharge Instructions (Signed)

## 2018-01-04 NOTE — Anesthesia Procedure Notes (Signed)
Procedure Name: Intubation Date/Time: 01/04/2018 2:49 PM Performed by: Jonna Munro, CRNA Pre-anesthesia Checklist: Patient identified, Emergency Drugs available, Suction available, Patient being monitored and Timeout performed Patient Re-evaluated:Patient Re-evaluated prior to induction Oxygen Delivery Method: Circle system utilized Preoxygenation: Pre-oxygenation with 100% oxygen Induction Type: IV induction Laryngoscope Size: Mac and 4 Grade View: Grade I Tube type: Oral Laser Tube: Cuffed inflated with minimal occlusive pressure - saline Tube size: 7.5 mm Number of attempts: 1 Airway Equipment and Method: Stylet Placement Confirmation: ETT inserted through vocal cords under direct vision,  positive ETCO2 and breath sounds checked- equal and bilateral Secured at: 24 cm Tube secured with: Tape Dental Injury: Teeth and Oropharynx as per pre-operative assessment  Comments: Intubated by Viann Fish, SRNA

## 2018-01-05 ENCOUNTER — Encounter (HOSPITAL_COMMUNITY): Payer: Self-pay | Admitting: Urology

## 2018-01-10 ENCOUNTER — Ambulatory Visit: Payer: Medicare Other | Admitting: Internal Medicine

## 2018-01-10 ENCOUNTER — Encounter: Payer: Self-pay | Admitting: Internal Medicine

## 2018-01-10 VITALS — BP 156/88 | HR 75 | Temp 97.8°F | Ht 72.5 in | Wt 198.0 lb

## 2018-01-10 DIAGNOSIS — I10 Essential (primary) hypertension: Secondary | ICD-10-CM | POA: Diagnosis not present

## 2018-01-10 DIAGNOSIS — N1330 Unspecified hydronephrosis: Secondary | ICD-10-CM

## 2018-01-10 DIAGNOSIS — K5909 Other constipation: Secondary | ICD-10-CM | POA: Diagnosis not present

## 2018-01-10 MED ORDER — POLYETHYLENE GLYCOL 3350 17 GM/SCOOP PO POWD: 17.0000 g | Freq: Two times a day (BID) | ORAL | 3 refills | Status: AC | PRN

## 2018-01-10 NOTE — Assessment & Plan Note (Signed)
Losartan in am; Hytrin at hs.

## 2018-01-10 NOTE — Progress Notes (Signed)
Subjective:  Patient ID: Jorge [name], male    DOB: 1929/05/25  Age: 84 y.o. MRN: [id number]  CC: No chief complaint on file.   HPI Jorge [name] presents for post-hosp f/u for hydronephrosis - stent placement on the L - Dr D. - 01/04/18. C/o constipation and abd pain - laxatives started to work today. Taking WM stool softener...  Chart reviewed  Outpatient Medications Prior to Visit  Medication Sig Dispense Refill  . Docusate Calcium (STOOL SOFTENER PO) Take 1 tablet by mouth at bedtime.     [name] losartan (COZAAR) 100 MG tablet Take 1 tablet (100 mg total) by mouth daily. 90 tablet 3  . polyethylene glycol (MIRALAX / GLYCOLAX) packet Take 17 g by mouth daily. 14 each 0  . traMADol (ULTRAM) 50 MG tablet Take 1-2 tablets (50-100 mg total) by mouth every 6 (six) hours as needed for moderate pain or severe pain (pain not relieved by Tylenol). 20 tablet 0  . triamcinolone cream (KENALOG) 0.5 % Apply 1 application topically 4 (four) times daily. 60 g 3   No facility-administered medications prior to visit.     ROS: Review of Systems  Constitutional: Negative for appetite change, fatigue and unexpected weight change.  HENT: Negative for congestion, nosebleeds, sneezing, sore throat and trouble swallowing.   Eyes: Negative for itching and visual disturbance.  Respiratory: Negative for cough.   Cardiovascular: Negative for chest pain, palpitations and leg swelling.  Gastrointestinal: Positive for abdominal distention, abdominal pain and constipation. Negative for blood in stool, diarrhea and nausea.  Genitourinary: Positive for frequency. Negative for hematuria.  Musculoskeletal: Negative for back pain, gait problem, joint swelling and neck pain.  Skin: Negative for rash.  Neurological: Negative for dizziness, tremors, speech difficulty and weakness.  Psychiatric/Behavioral: Negative for agitation, dysphoric mood, sleep disturbance and suicidal ideas. The patient is not  nervous/anxious.     Objective:  BP (!) 156/88 (BP Location: Left Arm, Patient Position: Sitting, Cuff Size: Normal)   Pulse 75   Temp 97.8 F (36.6 C) (Oral)   Ht 6' 0.5" (1.842 m)   Wt 198 lb (89.8 kg)   SpO2 97%   BMI 26.48 kg/m   BP Readings from Last 3 Encounters:  01/10/18 (!) 156/88  01/04/18 (!) 187/93  01/02/18 (!) 187/101    Wt Readings from Last 3 Encounters:  01/10/18 198 lb (89.8 kg)  01/04/18 201 lb (91.2 kg)  01/02/18 205 lb 14.6 oz (93.4 kg)    Physical Exam  Constitutional: He is oriented to person, place, and time. He appears well-developed. No distress.  NAD  HENT:  Mouth/Throat: Oropharynx is clear and moist.  Eyes: Pupils are equal, round, and reactive to light. Conjunctivae are normal.  Neck: Normal range of motion. No JVD present. No thyromegaly present.  Cardiovascular: Normal rate, regular rhythm, normal heart sounds and intact distal pulses. Exam reveals no gallop and no friction rub.  No murmur heard. Pulmonary/Chest: Effort normal and breath sounds normal. No respiratory distress. He has no wheezes. He has no rales. He exhibits no tenderness.  Abdominal: Soft. Bowel sounds are normal. He exhibits no distension and no mass. There is no tenderness. There is no rebound and no guarding.  Musculoskeletal: Normal range of motion. He exhibits no edema or tenderness.  Lymphadenopathy:    He has no cervical adenopathy.  Neurological: He is alert and oriented to person, place, and time. He has normal reflexes. No cranial nerve deficit. He exhibits normal muscle tone. He  displays a negative Romberg sign. Coordination and gait normal.  Skin: Skin is warm and dry. No rash noted.  Psychiatric: He has a normal mood and affect. His behavior is normal. Judgment and thought content normal.    Lab Results  Component Value Date   WBC 10.5 01/02/2018   HGB 13.5 01/02/2018   HCT 41.4 01/02/2018   PLT 270 01/02/2018   GLUCOSE 127 (H) 01/02/2018   CHOL 164  10/21/2015   TRIG 116.0 10/21/2015   HDL 52.20 10/21/2015   LDLCALC 88 10/21/2015   ALT 12 12/30/2017   AST 27 12/30/2017   NA 143 01/02/2018   K 3.9 01/02/2018   CL 109 01/02/2018   CREATININE 0.95 01/02/2018   BUN 13 01/02/2018   CO2 23 01/02/2018   TSH 1.26 10/21/2015   PSA 0.46 01/10/2007   INR 0.99 09/20/2017   HGBA1C 6.1 (H) 01/10/2007    Dg C-arm 1-60 Min-no Report  Result Date: 01/04/2018 Fluoroscopy was utilized by the requesting physician.  No radiographic interpretation.    Assessment & Plan:   There are no diagnoses linked to this encounter.   No orders of the defined types were placed in this encounter.    Follow-up: No follow-ups on file.  [name], MD

## 2018-01-10 NOTE — Assessment & Plan Note (Signed)
Miralax prn 

## 2018-01-10 NOTE — Assessment & Plan Note (Signed)
stent placement on the L - Dr Diona Fanti - 01/04/18.

## 2018-01-15 ENCOUNTER — Inpatient Hospital Stay: Payer: Medicare Other | Attending: Oncology | Admitting: Nurse Practitioner

## 2018-01-15 ENCOUNTER — Telehealth: Payer: Self-pay | Admitting: Pharmacist

## 2018-01-15 ENCOUNTER — Telehealth: Payer: Self-pay

## 2018-01-15 ENCOUNTER — Encounter: Payer: Self-pay | Admitting: Nurse Practitioner

## 2018-01-15 ENCOUNTER — Inpatient Hospital Stay: Payer: Medicare Other

## 2018-01-15 VITALS — BP 154/91 | HR 75 | Temp 98.6°F | Resp 14 | Ht 72.5 in | Wt 200.4 lb

## 2018-01-15 DIAGNOSIS — N1330 Unspecified hydronephrosis: Secondary | ICD-10-CM | POA: Insufficient documentation

## 2018-01-15 DIAGNOSIS — C7951 Secondary malignant neoplasm of bone: Secondary | ICD-10-CM

## 2018-01-15 DIAGNOSIS — C61 Malignant neoplasm of prostate: Secondary | ICD-10-CM | POA: Diagnosis not present

## 2018-01-15 DIAGNOSIS — I10 Essential (primary) hypertension: Secondary | ICD-10-CM

## 2018-01-15 DIAGNOSIS — Z923 Personal history of irradiation: Secondary | ICD-10-CM | POA: Diagnosis not present

## 2018-01-15 DIAGNOSIS — K5900 Constipation, unspecified: Secondary | ICD-10-CM | POA: Diagnosis not present

## 2018-01-15 LAB — COMPREHENSIVE METABOLIC PANEL
ALBUMIN: 3.2 g/dL — AB (ref 3.5–5.0)
ALT: 16 U/L (ref 0–44)
AST: 24 U/L (ref 15–41)
Alkaline Phosphatase: 242 U/L — ABNORMAL HIGH (ref 38–126)
Anion gap: 8 (ref 5–15)
BUN: 10 mg/dL (ref 8–23)
CHLORIDE: 107 mmol/L (ref 98–111)
CO2: 26 mmol/L (ref 22–32)
CREATININE: 0.87 mg/dL (ref 0.61–1.24)
Calcium: 9.5 mg/dL (ref 8.9–10.3)
GFR calc Af Amer: >60 mL/min (ref >60)
GFR calc non Af Amer: >60 mL/min (ref >60)
GLUCOSE: 105 mg/dL — AB (ref 70–99)
Potassium: 4.7 mmol/L (ref 3.5–5.1)
SODIUM: 141 mmol/L (ref 135–145)
Total Bilirubin: 0.3 mg/dL (ref 0.3–1.2)
Total Protein: 6.8 g/dL (ref 6.5–8.1)

## 2018-01-15 LAB — TRIGLYCERIDES: TRIGLYCERIDES: 161 mg/dL — AB (ref <150)

## 2018-01-15 LAB — CBC WITH DIFFERENTIAL (CANCER CENTER ONLY)
Abs Immature Granulocytes: 0.03 10*3/uL (ref 0.00–0.07)
BASOS PCT: 0 %
Basophils Absolute: 0 10*3/uL (ref 0.0–0.1)
Eosinophils Absolute: 0.1 10*3/uL (ref 0.0–0.5)
Eosinophils Relative: 1 %
HCT: 41.3 % (ref 39.0–52.0)
Hemoglobin: 13.1 g/dL (ref 13.0–17.0)
IMMATURE GRANULOCYTES: 0 %
LYMPHS PCT: 15 %
Lymphs Abs: 1.3 10*3/uL (ref 0.7–4.0)
MCH: 31.4 pg (ref 26.0–34.0)
MCHC: 31.7 g/dL (ref 30.0–36.0)
MCV: 99 fL (ref 80.0–100.0)
Monocytes Absolute: 0.6 10*3/uL (ref 0.1–1.0)
Monocytes Relative: 7 %
NEUTROS ABS: 6.6 10*3/uL (ref 1.7–7.7)
NEUTROS PCT: 77 %
PLATELETS: 300 10*3/uL (ref 150–400)
RBC: 4.17 MIL/uL — AB (ref 4.22–5.81)
RDW: 13 % (ref 11.5–15.5)
WBC: 8.6 10*3/uL (ref 4.0–10.5)
nRBC: 0 % (ref 0.0–0.2)

## 2018-01-15 NOTE — Telephone Encounter (Signed)
Oral Oncology Pharmacist Encounter  Received new referral for Zytiga (abiraterone) for the treatment of castration-resistant prostate cancer in conjunction with prednisone and androgen-deprivation therapy (Lupron injections), planned duration until disease progression or unacceptable toxicity.  Labs from 01/15/18 assessed, OK for treatment. BPs reviewed, most readings above normal limits, will continue to be monitored and risk of increased BP will be discussed with patient. Patient on one agent for hypertension  Current medication list in Epic reviewed, no DDIs with Zytiga identified.  Prescription will be sent to appropriate pharmacy for dispensing once insurance authorization is obtained.  Oral Oncology Clinic will continue to follow for insurance authorization, copayment issues, initial counseling and start date.  Johny Drilling, PharmD, BCPS, BCOP  01/15/2018 3:19 PM Oral Oncology Clinic [phone]

## 2018-01-15 NOTE — Progress Notes (Addendum)
  [facility] OFFICE PROGRESS NOTE   Diagnosis: Prostate cancer  INTERVAL HISTORY:   Mr. Bruna returns prior to scheduled follow-up.  He had recent left-sided abdominal pain.  He reports this was due to significant constipation.  He is now taking MiraLAX and the pain has resolved.  He has no new areas of pain.  He describes his appetite as "medium".  No urinary symptoms.  Objective:  Vital signs in last 24 hours:  Blood pressure (!) 154/91, pulse 75, temperature 98.6 F (37 C), temperature source Oral, resp. rate 14, height 6' 0.5" (1.842 m), weight 200 lb 6.4 oz (90.9 kg), SpO2 97 %.    HEENT: No thrush or ulcers. Resp: Lungs clear bilaterally. Cardio: Regular rate and rhythm. GI: Abdomen soft and nontender.  No hepatosplenomegaly. Vascular: No leg edema.    Lab Results:  Lab Results  Component Value Date   WBC 10.5 01/02/2018   HGB 13.5 01/02/2018   HCT 41.4 01/02/2018   MCV 97.6 01/02/2018   PLT 270 01/02/2018   NEUTROABS 8.7 (H) 01/02/2018    Imaging:  No results found.  Medications: I have reviewed the patient's current medications.  Assessment/Plan: 1.Prostate cancer, Gleason 7, status post a prostatectomy in 2008  Elevated PSA 2011, status post external beam radiation and androgen deprivation therapy, radiation completed March 2011, last injection July 2011  Elevated PSA October 2016, CT with periaortic and obturator lymphadenopathy  Androgen deprivation therapy resumed and continues, now on every 86-month Lupron  Bone scan 04/18/2017- new foci of metastatic disease at the right supra-acetabulum, left acetabulum, low cervical spine, and L2  CT 04/18/2017-left iliac, periaortic, and retrocrural lymphadenopathy  Bone scan 12/05/2017- progressive bone metastases; new left hydronephrosis and proximal hydroureter.  CT abdomen/pelvis 12/30/2017-moderate left hydronephrosis with moderate dilatation of the left renal pelvis and proximal left  ureter without obstructing calculus.  Stable large hiatal hernia.  Stable sclerotic lesions in the pelvis and L3 vertebral body concerning for metastatic disease.  No enlarged abdominal or pelvic lymph nodes.  2.Hypertension  3.  Recurrent hiatal hernia-status post gastrostomy tube placement 09/20/2017  4.  Left hydronephrosis status post placement of double-J stent 01/04/2018.   Disposition: [name] appears stable.  We reviewed the results of the recent bone scan and CT scan.  Dr. Ammie Dalton recommends beginning treatment with Zytiga/prednisone.  We reviewed potential toxicities associated with steroids including insomnia, elevated blood sugar.  We reviewed potential toxicities associated with Zytiga including hypertension, edema, elevated triglycerides, elevated blood sugar, hot flash, diarrhea, rash, hepatotoxicity, bone marrow toxicity.  He agrees to proceed.  We will obtain baseline labs today to include a CBC, chemistry panel, triglycerides and PSA.  He will return for a follow-up chemistry panel and triglycerides in 3 weeks.  He will return for lab and follow-up in 6 weeks.  He will contact the office in the interim with any problems.  Patient seen with Dr. Benay Spice.  Ned Card ANP/GNP-BC   01/15/2018  12:05 PM This was a shared visit with Ned Card.  Mr. Byron has progressive hormone refractory prostate cancer.  The PSA is elevated, he has left hydronephrosis, and there is bone scan evidence of progressive disease.  I reviewed the CT and bone scan images.  I recommend treatment with abiraterone/prednisone.  We reviewed potential toxicities associated with this regimen.  He agrees to proceed.  We checked a baseline PSA and chemistry panel today.  Julieanne Manson, MD

## 2018-01-15 NOTE — Telephone Encounter (Signed)
Printed avs and calender of upcoming appointment. Per 10/14 los 

## 2018-01-15 NOTE — Patient Instructions (Signed)
Prednisone tablets What is this medicine? PREDNISONE (PRED ni sone) is a corticosteroid. It is commonly used to treat inflammation of the skin, joints, lungs, and other organs. Common conditions treated include asthma, allergies, and arthritis. It is also used for other conditions, such as blood disorders and diseases of the adrenal glands. This medicine may be used for other purposes; ask your health care provider or pharmacist if you have questions. COMMON BRAND NAME(S): Deltasone, Predone, Sterapred, Sterapred DS What should I tell my health care provider before I take this medicine? They need to know if you have any of these conditions: -Cushing's syndrome -diabetes -glaucoma -heart disease -high blood pressure -infection (especially a virus infection such as chickenpox, cold sores, or herpes) -kidney disease -liver disease -mental illness -myasthenia gravis -osteoporosis -seizures -stomach or intestine problems -thyroid disease -an unusual or allergic reaction to lactose, prednisone, other medicines, foods, dyes, or preservatives -pregnant or trying to get pregnant -breast-feeding How should I use this medicine? Take this medicine by mouth with a glass of water. Follow the directions on the prescription label. Take this medicine with food. If you are taking this medicine once a day, take it in the morning. Do not take more medicine than you are told to take. Do not suddenly stop taking your medicine because you may develop a severe reaction. Your doctor will tell you how much medicine to take. If your doctor wants you to stop the medicine, the dose may be slowly lowered over time to avoid any side effects. Talk to your pediatrician regarding the use of this medicine in children. Special care may be needed. Overdosage: If you think you have taken too much of this medicine contact a poison control center or emergency room at once. NOTE: This medicine is only for you. Do not share this  medicine with others. What if I miss a dose? If you miss a dose, take it as soon as you can. If it is almost time for your next dose, talk to your doctor or health care professional. You may need to miss a dose or take an extra dose. Do not take double or extra doses without advice. What may interact with this medicine? Do not take this medicine with any of the following medications: -metyrapone -mifepristone This medicine may also interact with the following medications: -aminoglutethimide -amphotericin B -aspirin and aspirin-like medicines -barbiturates -certain medicines for diabetes, like glipizide or glyburide -cholestyramine -cholinesterase inhibitors -cyclosporine -digoxin -diuretics -ephedrine -male hormones, like estrogens and birth control pills -isoniazid -ketoconazole -NSAIDS, medicines for pain and inflammation, like ibuprofen or naproxen -phenytoin -rifampin -toxoids -vaccines -warfarin This list may not describe all possible interactions. Give your health care provider a list of all the medicines, herbs, non-prescription drugs, or dietary supplements you use. Also tell them if you smoke, drink alcohol, or use illegal drugs. Some items may interact with your medicine. What should I watch for while using this medicine? Visit your doctor or health care professional for regular checks on your progress. If you are taking this medicine over a prolonged period, carry an identification card with your name and address, the type and dose of your medicine, and your doctor's name and address. This medicine may increase your risk of getting an infection. Tell your doctor or health care professional if you are around anyone with measles or chickenpox, or if you develop sores or blisters that do not heal properly. If you are going to have surgery, tell your doctor or health care professional that  you have taken this medicine within the last twelve months. Ask your doctor or health  care professional about your diet. You may need to lower the amount of salt you eat. This medicine may affect blood sugar levels. If you have diabetes, check with your doctor or health care professional before you change your diet or the dose of your diabetic medicine. What side effects may I notice from receiving this medicine? Side effects that you should report to your doctor or health care professional as soon as possible: -allergic reactions like skin rash, itching or hives, swelling of the face, lips, or tongue -changes in emotions or moods -changes in vision -depressed mood -eye pain -fever or chills, cough, sore throat, pain or difficulty passing urine -increased thirst -swelling of ankles, feet Side effects that usually do not require medical attention (report to your doctor or health care professional if they continue or are bothersome): -confusion, excitement, restlessness -headache -nausea, vomiting -skin problems, acne, thin and shiny skin -trouble sleeping -weight gain This list may not describe all possible side effects. Call your doctor for medical advice about side effects. You may report side effects to FDA at 1-800-FDA-1088. Where should I keep my medicine? Keep out of the reach of children. Store at room temperature between 15 and 30 degrees C (59 and 86 degrees F). Protect from light. Keep container tightly closed. Throw away any unused medicine after the expiration date. NOTE: This sheet is a summary. It may not cover all possible information. If you have questions about this medicine, talk to your doctor, pharmacist, or health care provider.  2018 Elsevier/Gold Standard (2010-11-04 10:57:14) Abiraterone oral tablets What is this medicine? ABIRATERONE (a bir A ter one) blocks the effect of the male hormone called testosterone. This medicine is used for certain types of prostate cancer. This medicine may be used for other purposes; ask your health care provider or  pharmacist if you have questions. COMMON BRAND NAME(S): ZYTIGA What should I tell my health care provider before I take this medicine? They need to know if you have any of these conditions: -having surgery -heart disease -high blood pressure -history of irregular heartbeat -liver disease -low levels of potassium in the blood -an unusual or allergic reaction to abiraterone, other medicines, foods, dyes, or preservatives -pregnant or trying to get pregnant -breast-feeding How should I use this medicine? Take this medicine by mouth with a glass of water. Follow the directions on the prescription label. Take this medicine on an empty stomach, at least 1 hour before or 2 hours after food. Do not take with food. Do not cut, crush, or chew this medicine. Take your medicine at regular intervals. Do not take it more often than directed. Do not stop taking except on your doctor's advice. Talk to your pediatrician regarding the use of this medicine in children. Special care may be needed. Overdosage: If you think you have taken too much of this medicine contact a poison control center or emergency room at once. NOTE: This medicine is only for you. Do not share this medicine with others. What if I miss a dose? If you miss a dose, take the next dose at your regular time. Do not take double or extra doses. If you miss more than 1 dose, tell your healthcare provider right away. What may interact with this medicine? This medicine may interact with the following medications: -carbamazepine -dextromethorphan -phenobarbital -phenytoin -pioglitazone -rifabutin -rifampin -rifapentine -thioridazine This list may not describe all possible interactions. Give  your health care provider a list of all the medicines, herbs, non-prescription drugs, or dietary supplements you use. Also tell them if you smoke, drink alcohol, or use illegal drugs. Some items may interact with your medicine. What should I watch for  while using this medicine? Tell your doctor or healthcare professional if your symptoms do not start to get better or if they get worse. If you are a woman, do not get pregnant while taking this medicine. If you do get pregnant, tell your doctor right away. There is a potential for serious side effects to an unborn child. Women who are pregnant or may become pregnant should not touch this medicine without wearing gloves. Men taking this medicine must use a condom when having sex with a pregnant woman during and for one week after treatment with this medicine. Men who have sex with a woman who may get pregnant must use a condom and another form of birth control during and for one week after treatment with this medicine. What side effects may I notice from receiving this medicine? Side effects that you should report to your doctor or health care professional as soon as possible: -allergic reactions like skin rash, itching or hives, swelling of the face, lips, or tongue -chest pain or chest tightness -fast, irregular heartbeat -feeling faint or lightheaded, falls -general ill feeling or flu-like symptoms -palpitations -right upper belly pain -signs of infection - fever or chills, cough, sore throat, pain or difficulty passing urine -swelling of the ankles, feet, hands -unusually weak or tired -yellowing of the eyes or skin Side effects that usually do not require medical attention (report to your doctor or health care professional if they continue or are bothersome): -cough -diarrhea -facial flushing -increased urination -joint pain -muscle pain This list may not describe all possible side effects. Call your doctor for medical advice about side effects. You may report side effects to FDA at 1-800-FDA-1088. Where should I keep my medicine? Keep out of the reach of children. Store at room temperature between 15 and 30 degrees C (59 and 86 degrees F). Throw away any unused medicine after the  expiration date. NOTE: This sheet is a summary. It may not cover all possible information. If you have questions about this medicine, talk to your doctor, pharmacist, or health care provider.  2018 Elsevier/Gold Standard (2015-08-20 19:07:14)

## 2018-01-16 ENCOUNTER — Telehealth: Payer: Self-pay

## 2018-01-16 LAB — PROSTATE-SPECIFIC AG, SERUM (LABCORP): Prostate Specific Ag, Serum: 129 ng/mL — ABNORMAL HIGH (ref 0.0–4.0)

## 2018-01-16 MED ORDER — PREDNISONE 5 MG PO TABS: 5.0000 mg | ORAL_TABLET | Freq: Every day | ORAL | 3 refills | Status: DC

## 2018-01-16 NOTE — Telephone Encounter (Signed)
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   Counseled patient on administration, dosing, side effects, monitoring, drug-food interactions, safe handling, storage, and disposal.  Patient will take Zytiga 250mg  tablets, 4 tablets (1000mg ) by mouth once daily on an empty stomach, 1 hour before or 2 hours after a meal.  Patient states he will take his Zyitga 1st thing in the morning and will wait at least 1 hour before eating.  Patient will take prednisone 5mg  tablet, 1 tablet by mouth one daily with breakfast.  Zytiga start date: TBD, pending medication acquisition  Adverse effects include but are not limited to: peripheral edema, GI upset, hypertension, hot flashes, fatigue, and arthralgias.    Prednisone prescription has been sent to [facility] on [address]. Patient will obtain prednisone and knows to start prednisone on the same day as Zytiga start.  Reviewed with patient importance of keeping a medication schedule and plan for any missed doses.  Jorge [name] voiced understanding and appreciation.   All questions answered. Medication reconciliation performed and medication/allergy list updated.  Patient without prescription insurance coverage. Oral Oncology Clinic will work with patient to submit manufacturer assistance application to [place] [facility]. Patient states he will bring income documentation to the office tomorrow, 01/17/2018, and signed manufacturer assistance application at that time. We anticipate approximately 2 weeks prior to patient receiving his first shipment of Zytiga from patient assistance pharmacy. We will continue to keep patient and MD updated through the  application process.  Patient knows to call the office with questions or concerns.  Oral Oncology Clinic will continue to follow.  Jorge [name], PharmD, BCPS, BCOP  01/16/2018 11:41 AM Oral Oncology Clinic [phone]

## 2018-01-16 NOTE — Telephone Encounter (Signed)
Oral Oncology Patient Advocate Encounter  During my benefit investigation I found that [name] does not have prescription insurance. I called the patient and he confirmed this. I explained the [employer] application process and he agreed to bring in his SSI letter and sign the forms later today or tomorrow. I will place the doctors form to sign and indicate refills if necessary in the folder.  Will continue to update  [place] [place] [facility] [place] Phone [phone] Fax [phone]

## 2018-01-18 NOTE — Telephone Encounter (Signed)
Oral Oncology Patient Advocate Encounter  Jorge [name] came in 10/16 and met [name] in the lobby. He signed the forms and brought his SSI letter. I faxed the application 67/25.  Will update with final determination  [place] [facility] [place] Phone [phone] Fax [phone]

## 2018-01-23 NOTE — Telephone Encounter (Signed)
Oral Oncology Pharmacist Encounter  Received notification from [facility] patient assistance program with request for completed patient assistance application.  Application needs to be marked with whether or not patient files lateral income taxes. Second page of application also needs printed patient name as well as contact information for office contact.  Application marked that patient does not file federal income taxes, patient's name has been printed as well as information about on the office contact added to page 2.  Pages 1 and 2 of [employer] application has been faxed to [phone].  I called and left voicemail for patient updating him about rescinding application and that we would continue to keep him posted.  Johny Drilling, PharmD, BCPS, BCOP  01/23/2018 10:03 AM Oral Oncology Clinic [phone]

## 2018-01-26 NOTE — Telephone Encounter (Signed)
Oral Oncology Pharmacist Encounter  I called [employer] to follow-up on status of patient's application to receive Zytiga through their compassionate use program. Representative stated that they did receive needed documentation that was faxed from the office on 01/23/2018. Per representative, determination has not yet been made on patient's enrollment into their program. She will mark his application as urgent. Oral oncology will continue to follow until final determination.  Johny Drilling, PharmD, BCPS, BCOP  01/26/2018 3:24 PM Oral Oncology Clinic [phone]

## 2018-01-29 DIAGNOSIS — C61 Malignant neoplasm of prostate: Secondary | ICD-10-CM | POA: Diagnosis not present

## 2018-01-29 DIAGNOSIS — Z5111 Encounter for antineoplastic chemotherapy: Secondary | ICD-10-CM | POA: Diagnosis not present

## 2018-01-29 DIAGNOSIS — Z79899 Other long term (current) drug therapy: Secondary | ICD-10-CM | POA: Diagnosis not present

## 2018-01-29 DIAGNOSIS — C7951 Secondary malignant neoplasm of bone: Secondary | ICD-10-CM | POA: Diagnosis not present

## 2018-01-30 NOTE — Telephone Encounter (Signed)
Oral Oncology Patient Advocate Encounter  Fabio Asa has been approved through [place] and [place] 01/30/18-01/31/2019.  The patient will receive his medicine in the mail at his home free of charge each month. If he has not heard from [facility] 10 days before he needs a refill he is to call [phone] and request a refill.  I left a message for the patient to please return my call at his convenience.   [facility] Patient [place] Phone [phone] Fax [phone]

## 2018-01-30 NOTE — Telephone Encounter (Signed)
thanks

## 2018-01-30 NOTE — Telephone Encounter (Signed)
Oral Oncology Patient Advocate Encounter  I called Jorge [name] and Jorge [name] to follow up on Zytiga application. They had everything they needed and was pushing the application through processing.   Will continue to follow up and update here  [facility] Patient [place] Phone [phone] Fax [phone]

## 2018-02-05 ENCOUNTER — Inpatient Hospital Stay: Payer: Medicare Other | Attending: Oncology

## 2018-02-05 DIAGNOSIS — N1330 Unspecified hydronephrosis: Secondary | ICD-10-CM | POA: Diagnosis not present

## 2018-02-05 DIAGNOSIS — I10 Essential (primary) hypertension: Secondary | ICD-10-CM | POA: Diagnosis not present

## 2018-02-05 DIAGNOSIS — C61 Malignant neoplasm of prostate: Secondary | ICD-10-CM | POA: Diagnosis not present

## 2018-02-05 DIAGNOSIS — C7951 Secondary malignant neoplasm of bone: Secondary | ICD-10-CM | POA: Insufficient documentation

## 2018-02-05 DIAGNOSIS — Z923 Personal history of irradiation: Secondary | ICD-10-CM | POA: Diagnosis not present

## 2018-02-05 LAB — CMP (CANCER CENTER ONLY)
ALBUMIN: 3.1 g/dL — AB (ref 3.5–5.0)
ALT: 11 U/L (ref 0–44)
AST: 20 U/L (ref 15–41)
Alkaline Phosphatase: 242 U/L — ABNORMAL HIGH (ref 38–126)
Anion gap: 7 (ref 5–15)
BUN: 9 mg/dL (ref 8–23)
CHLORIDE: 108 mmol/L (ref 98–111)
CO2: 26 mmol/L (ref 22–32)
Calcium: 9.3 mg/dL (ref 8.9–10.3)
Creatinine: 0.82 mg/dL (ref 0.61–1.24)
GFR, Est AFR Am: >60 mL/min (ref >60)
GFR, Estimated: >60 mL/min (ref >60)
GLUCOSE: 100 mg/dL — AB (ref 70–99)
POTASSIUM: 4.5 mmol/L (ref 3.5–5.1)
Sodium: 141 mmol/L (ref 135–145)
Total Bilirubin: 0.4 mg/dL (ref 0.3–1.2)
Total Protein: 6.6 g/dL (ref 6.5–8.1)

## 2018-02-05 LAB — TRIGLYCERIDES: TRIGLYCERIDES: 137 mg/dL (ref <150)

## 2018-02-26 ENCOUNTER — Inpatient Hospital Stay: Payer: Medicare Other

## 2018-02-26 ENCOUNTER — Other Ambulatory Visit: Payer: Self-pay | Admitting: Oncology

## 2018-02-26 ENCOUNTER — Telehealth: Payer: Self-pay | Admitting: Oncology

## 2018-02-26 ENCOUNTER — Inpatient Hospital Stay (HOSPITAL_BASED_OUTPATIENT_CLINIC_OR_DEPARTMENT_OTHER): Payer: Medicare Other | Admitting: Oncology

## 2018-02-26 VITALS — BP 191/86 | HR 68 | Temp 98.2°F | Resp 18 | Ht 72.5 in | Wt 200.5 lb

## 2018-02-26 DIAGNOSIS — N1330 Unspecified hydronephrosis: Secondary | ICD-10-CM | POA: Diagnosis not present

## 2018-02-26 DIAGNOSIS — C61 Malignant neoplasm of prostate: Secondary | ICD-10-CM

## 2018-02-26 DIAGNOSIS — I10 Essential (primary) hypertension: Secondary | ICD-10-CM

## 2018-02-26 DIAGNOSIS — N1339 Other hydronephrosis: Secondary | ICD-10-CM

## 2018-02-26 DIAGNOSIS — C7951 Secondary malignant neoplasm of bone: Secondary | ICD-10-CM | POA: Diagnosis not present

## 2018-02-26 DIAGNOSIS — Z923 Personal history of irradiation: Secondary | ICD-10-CM | POA: Diagnosis not present

## 2018-02-26 LAB — CMP (CANCER CENTER ONLY)
ALK PHOS: 429 U/L — AB (ref 38–126)
ALT: 7 U/L (ref 0–44)
AST: 17 U/L (ref 15–41)
Albumin: 3.3 g/dL — ABNORMAL LOW (ref 3.5–5.0)
Anion gap: 7 (ref 5–15)
BUN: 12 mg/dL (ref 8–23)
CALCIUM: 8.9 mg/dL (ref 8.9–10.3)
CO2: 25 mmol/L (ref 22–32)
CREATININE: 1.01 mg/dL (ref 0.61–1.24)
Chloride: 113 mmol/L — ABNORMAL HIGH (ref 98–111)
GFR, Estimated: >60 mL/min (ref >60)
Glucose, Bld: 98 mg/dL (ref 70–99)
Potassium: 3.5 mmol/L (ref 3.5–5.1)
SODIUM: 145 mmol/L (ref 135–145)
Total Bilirubin: 0.5 mg/dL (ref 0.3–1.2)
Total Protein: 6.3 g/dL — ABNORMAL LOW (ref 6.5–8.1)

## 2018-02-26 LAB — TRIGLYCERIDES: Triglycerides: 169 mg/dL — ABNORMAL HIGH (ref <150)

## 2018-02-26 NOTE — Telephone Encounter (Signed)
Printed calendar and avs. °

## 2018-02-26 NOTE — Progress Notes (Signed)
  [facility] OFFICE PROGRESS NOTE   Diagnosis: Prostate cancer  INTERVAL HISTORY:   Jorge [name] returns as scheduled.  He began abiraterone/prednisone on 02/07/2018.  He is tolerating treatment well.  No diarrhea or rash.  No complaint today.  Objective:  Vital signs in last 24 hours:  Blood pressure (!) 191/86, pulse 68, temperature 98.2 F (36.8 C), temperature source Oral, resp. rate 18, height 6' 0.5" (1.842 m), weight 200 lb 8 oz (90.9 kg), SpO2 98 %.   HEENT: No thrush Resp: Lungs clear bilaterally Cardio: Regular rate and rhythm GI: No hepatosplenomegaly, nontender Vascular: No leg edema   Lab Results:  Lab Results  Component Value Date   WBC 8.6 01/15/2018   HGB 13.1 01/15/2018   HCT 41.3 01/15/2018   MCV 99.0 01/15/2018   PLT 300 01/15/2018   NEUTROABS 6.6 01/15/2018    CMP  Lab Results  Component Value Date   NA 145 02/26/2018   K 3.5 02/26/2018   CL 113 (H) 02/26/2018   CO2 25 02/26/2018   GLUCOSE 98 02/26/2018   BUN 12 02/26/2018   CREATININE 1.01 02/26/2018   CALCIUM 8.9 02/26/2018   PROT 6.3 (L) 02/26/2018   ALBUMIN 3.3 (L) 02/26/2018   AST 17 02/26/2018   ALT 7 02/26/2018   ALKPHOS 429 (H) 02/26/2018   BILITOT 0.5 02/26/2018   GFRNONAA >60 02/26/2018   GFRAA >60 02/26/2018    Medications: I have reviewed the patient's current medications.   Assessment/Plan: 1.Prostate cancer, Gleason 7, status post a prostatectomy in 2008  Elevated PSA 2011, status post external beam radiation and androgen deprivation therapy, radiation completed March 2011, last injection July 2011  Elevated PSA October 2016, CT with periaortic and obturator lymphadenopathy  Androgen deprivation therapy resumed and continues, now on every 73-month Lupron  Bone scan 04/18/2017-new foci of metastatic disease at the right supra-acetabulum, left acetabulum, low cervical spine, and L2  CT 04/18/2017-left iliac, periaortic, and retrocrural  lymphadenopathy  Bone scan 12/05/2017- progressive bone metastases; new left hydronephrosis and proximal hydroureter.  CT abdomen/pelvis 12/30/2017-moderate left hydronephrosis with moderate dilatation of the left renal pelvis and proximal left ureter without obstructing calculus.  Stable large hiatal hernia.  Stable sclerotic lesions in the pelvis and L3 vertebral body concerning for metastatic disease.  No enlarged abdominal or pelvic lymph nodes.  Abiraterone/prednisone started 02/07/2018  2.Hypertension  3.Recurrent hiatal hernia-status post gastrostomy tube placement 09/20/2017  4.  Left hydronephrosis status post placement of double-J stent 01/04/2018.   Disposition: Jorge [name] appears stable.  He is tolerating the abiraterone well.  He continues every 64-month Lupron with Dr. Diona Fanti.  He will return for an office and lab visit in approximately 1 month.  We will check the PSA when he returns in 1 month.  15 minutes were spent with the patient today.  The majority of the time was used for counseling and coordination of care.  Betsy Coder, MD  02/26/2018  11:43 AM

## 2018-02-27 LAB — PROSTATE-SPECIFIC AG, SERUM (LABCORP): PROSTATE SPECIFIC AG, SERUM: 82.1 ng/mL — AB (ref 0.0–4.0)

## 2018-02-28 ENCOUNTER — Telehealth: Payer: Self-pay | Admitting: *Deleted

## 2018-02-28 NOTE — Telephone Encounter (Signed)
TCT patient regarding results of recent PSA. Spoke with patient and informed him that his PSA is lower than last month. Results given. He voiced understanding and appreciated the call.  He is aware of upcoming appts in January 2020

## 2018-03-30 DIAGNOSIS — R310 Gross hematuria: Secondary | ICD-10-CM | POA: Diagnosis not present

## 2018-03-30 DIAGNOSIS — C61 Malignant neoplasm of prostate: Secondary | ICD-10-CM | POA: Diagnosis not present

## 2018-04-02 DIAGNOSIS — R310 Gross hematuria: Secondary | ICD-10-CM | POA: Diagnosis not present

## 2018-04-05 DIAGNOSIS — R310 Gross hematuria: Secondary | ICD-10-CM | POA: Diagnosis not present

## 2018-04-05 DIAGNOSIS — N130 Hydronephrosis with ureteropelvic junction obstruction: Secondary | ICD-10-CM | POA: Diagnosis not present

## 2018-04-06 ENCOUNTER — Encounter: Payer: Self-pay | Admitting: Nurse Practitioner

## 2018-04-06 ENCOUNTER — Inpatient Hospital Stay: Payer: Medicare Other | Attending: Oncology

## 2018-04-06 ENCOUNTER — Inpatient Hospital Stay (HOSPITAL_BASED_OUTPATIENT_CLINIC_OR_DEPARTMENT_OTHER): Payer: Medicare Other | Admitting: Nurse Practitioner

## 2018-04-06 ENCOUNTER — Telehealth: Payer: Self-pay | Admitting: Nurse Practitioner

## 2018-04-06 VITALS — BP 176/89 | HR 77 | Temp 98.1°F | Resp 18 | Ht 72.5 in | Wt 202.8 lb

## 2018-04-06 DIAGNOSIS — C61 Malignant neoplasm of prostate: Secondary | ICD-10-CM

## 2018-04-06 DIAGNOSIS — Z923 Personal history of irradiation: Secondary | ICD-10-CM | POA: Insufficient documentation

## 2018-04-06 DIAGNOSIS — I10 Essential (primary) hypertension: Secondary | ICD-10-CM | POA: Insufficient documentation

## 2018-04-06 DIAGNOSIS — C7951 Secondary malignant neoplasm of bone: Secondary | ICD-10-CM | POA: Insufficient documentation

## 2018-04-06 DIAGNOSIS — N1330 Unspecified hydronephrosis: Secondary | ICD-10-CM

## 2018-04-06 LAB — CMP (CANCER CENTER ONLY)
ALBUMIN: 3 g/dL — AB (ref 3.5–5.0)
ALK PHOS: 393 U/L — AB (ref 38–126)
ALT: 8 U/L (ref 0–44)
AST: 17 U/L (ref 15–41)
Anion gap: 7 (ref 5–15)
BUN: 10 mg/dL (ref 8–23)
CALCIUM: 8.9 mg/dL (ref 8.9–10.3)
CO2: 26 mmol/L (ref 22–32)
CREATININE: 0.82 mg/dL (ref 0.61–1.24)
Chloride: 110 mmol/L (ref 98–111)
GFR, Est AFR Am: >60 mL/min (ref >60)
GFR, Estimated: >60 mL/min (ref >60)
GLUCOSE: 111 mg/dL — AB (ref 70–99)
Potassium: 3.3 mmol/L — ABNORMAL LOW (ref 3.5–5.1)
SODIUM: 143 mmol/L (ref 135–145)
Total Bilirubin: 0.4 mg/dL (ref 0.3–1.2)
Total Protein: 6.1 g/dL — ABNORMAL LOW (ref 6.5–8.1)

## 2018-04-06 NOTE — Telephone Encounter (Signed)
Printed calendar and avs. °

## 2018-04-06 NOTE — Progress Notes (Signed)
  [name]   Diagnosis: Prostate cancer  INTERVAL HISTORY:   Mr. Grieger returns as scheduled.  He continues abiraterone/prednisone.  He feels well.  He has a good appetite.  No nausea or vomiting.  No mouth sores.  No diarrhea.  No change in baseline hot flashes.  He has recently been evaluated at the urology center for hematuria.  He temporarily had an indwelling urinary catheter.  He had significant pain related to the catheter which resolved when it was removed.  He is having no problems passing his urine.  He notes his urine is now "pink-tinged".  No fever.  No back pain.  Objective:  Vital signs in last 24 hours:  Blood pressure (!) 176/89, pulse 77, temperature 98.1 F (36.7 C), temperature source Oral, resp. rate 18, height 6' 0.5" (1.842 m), weight 202 lb 12.8 oz (92 kg), SpO2 97 %.    HEENT: No thrush or ulcers. Resp: Lungs clear bilaterally. Cardio: Regular rate and rhythm. GI: Abdomen soft and nontender.  No hepatomegaly. Vascular: No leg edema.    Lab Results:  Lab Results  Component Value Date   WBC 8.6 01/15/2018   HGB 13.1 01/15/2018   HCT 41.3 01/15/2018   MCV 99.0 01/15/2018   PLT 300 01/15/2018   NEUTROABS 6.6 01/15/2018    Imaging:  No results found.  Medications: I have reviewed the patient's current medications.  Assessment/Plan: 1.Prostate cancer, Gleason 7, status post a prostatectomy in 2008  Elevated PSA 2011, status post external beam radiation and androgen deprivation therapy, radiation completed March 2011, last injection July 2011  Elevated PSA October 2016, CT with periaortic and obturator lymphadenopathy  Androgen deprivation therapy resumed and continues, now on every 19-month Lupron  Bone scan 04/18/2017-new foci of metastatic disease at the right supra-acetabulum, left acetabulum, low cervical spine, and L2  CT 04/18/2017-left iliac, periaortic, and retrocrural lymphadenopathy  Bone scan  12/05/2017- progressive bone metastases; new left hydronephrosis and proximal hydroureter.  CT abdomen/pelvis 12/30/2017-moderate left hydronephrosis with moderate dilatation of the left renal pelvis and proximal left ureter without obstructing calculus. Stable large hiatal hernia. Stable sclerotic lesions in the pelvis and L3 vertebral body concerning for metastatic disease. No enlarged abdominal or pelvic lymph nodes.  Abiraterone/prednisone started 02/07/2018  PSA improved 02/26/2018  2.Hypertension  3.Recurrent hiatal hernia-status post gastrostomy tube placement 09/20/2017  4.Left hydronephrosis status post placement of double-J stent 01/04/2018.   Disposition: Jorge [name] appears stable.  He will continue abiraterone/prednisone.  We will follow-up on the PSA from today.  He continues every 91-month Lupron with Dr. Diona Fanti.  He will return for lab and follow-up in 6 weeks.  He will continue follow-up with Dr. Diona Fanti regarding the hematuria.  Plan reviewed with Dr. Benay Spice.    Ned Card ANP/GNP-BC   04/06/2018  11:59 AM

## 2018-04-07 LAB — PROSTATE-SPECIFIC AG, SERUM (LABCORP): PROSTATE SPECIFIC AG, SERUM: 99.8 ng/mL — AB (ref 0.0–4.0)

## 2018-04-09 ENCOUNTER — Other Ambulatory Visit (INDEPENDENT_AMBULATORY_CARE_PROVIDER_SITE_OTHER): Payer: Medicare Other

## 2018-04-09 ENCOUNTER — Ambulatory Visit: Payer: Medicare Other | Admitting: Internal Medicine

## 2018-04-09 ENCOUNTER — Encounter: Payer: Self-pay | Admitting: Internal Medicine

## 2018-04-09 VITALS — BP 156/94 | HR 70 | Temp 98.2°F | Ht 72.5 in | Wt 199.0 lb

## 2018-04-09 DIAGNOSIS — R310 Gross hematuria: Secondary | ICD-10-CM

## 2018-04-09 DIAGNOSIS — E559 Vitamin D deficiency, unspecified: Secondary | ICD-10-CM

## 2018-04-09 DIAGNOSIS — E441 Mild protein-calorie malnutrition: Secondary | ICD-10-CM

## 2018-04-09 DIAGNOSIS — C61 Malignant neoplasm of prostate: Secondary | ICD-10-CM

## 2018-04-09 DIAGNOSIS — F411 Generalized anxiety disorder: Secondary | ICD-10-CM

## 2018-04-09 DIAGNOSIS — E876 Hypokalemia: Secondary | ICD-10-CM

## 2018-04-09 DIAGNOSIS — C7951 Secondary malignant neoplasm of bone: Secondary | ICD-10-CM

## 2018-04-09 DIAGNOSIS — N1330 Unspecified hydronephrosis: Secondary | ICD-10-CM

## 2018-04-09 DIAGNOSIS — E46 Unspecified protein-calorie malnutrition: Secondary | ICD-10-CM | POA: Insufficient documentation

## 2018-04-09 LAB — PROTIME-INR
INR: 0.9 ratio (ref 0.8–1.0)
Prothrombin Time: 10.8 s (ref 9.6–13.1)

## 2018-04-09 LAB — CBC WITH DIFFERENTIAL/PLATELET
Basophils Absolute: 0 10*3/uL (ref 0.0–0.1)
Basophils Relative: 0.5 % (ref 0.0–3.0)
Eosinophils Absolute: 0.1 10*3/uL (ref 0.0–0.7)
Eosinophils Relative: 1.1 % (ref 0.0–5.0)
HCT: 39.8 % (ref 39.0–52.0)
Hemoglobin: 13.3 g/dL (ref 13.0–17.0)
Lymphocytes Relative: 10.2 % — ABNORMAL LOW (ref 12.0–46.0)
Lymphs Abs: 1 10*3/uL (ref 0.7–4.0)
MCHC: 33.4 g/dL (ref 30.0–36.0)
MCV: 96.2 fl (ref 78.0–100.0)
Monocytes Absolute: 0.6 10*3/uL (ref 0.1–1.0)
Monocytes Relative: 6.6 % (ref 3.0–12.0)
Neutro Abs: 7.9 10*3/uL — ABNORMAL HIGH (ref 1.4–7.7)
Neutrophils Relative %: 81.6 % — ABNORMAL HIGH (ref 43.0–77.0)
Platelets: 263 10*3/uL (ref 150.0–400.0)
RBC: 4.13 Mil/uL — ABNORMAL LOW (ref 4.22–5.81)
RDW: 13.9 % (ref 11.5–15.5)
WBC: 9.7 10*3/uL (ref 4.0–10.5)

## 2018-04-09 MED ORDER — POTASSIUM CHLORIDE ER 10 MEQ PO TBCR: 10.0000 meq | EXTENDED_RELEASE_TABLET | Freq: Every day | ORAL | 2 refills | Status: DC

## 2018-04-09 NOTE — Assessment & Plan Note (Signed)
Doing well 

## 2018-04-09 NOTE — Assessment & Plan Note (Signed)
Vit D 

## 2018-04-09 NOTE — Assessment & Plan Note (Signed)
F/u w/Dr Benay Spice F/u w/Dr Diona Fanti

## 2018-04-09 NOTE — Progress Notes (Signed)
+   Subjective:  Patient ID: Jorge [name], male    DOB: [birth year]  Age: 85 y.o. MRN: [id number]  CC: No chief complaint on file.   HPI Jorge [name] presents for urinary retention - Dr Diona Fanti put a catheter in, then took it out; stent is working; still passing clots w/urine... Labs on 1/3 w/low K and low adlumine   Outpatient Medications Prior to Visit  Medication Sig Dispense Refill  . Docusate Calcium (STOOL SOFTENER PO) Take 1 tablet by mouth at bedtime.     [name] losartan (COZAAR) 100 MG tablet Take 1 tablet (100 mg total) by mouth daily. 90 tablet 3  . polyethylene glycol powder (GLYCOLAX/MIRALAX) powder Take 17 g by mouth 2 (two) times daily as needed for mild constipation or moderate constipation. 500 g 3  . predniSONE (DELTASONE) 5 MG tablet Take 1 tablet (5 mg total) by mouth daily with breakfast. 90 tablet 3  . ZYTIGA 250 MG tablet TAKE 4 TABLETS BY MOUTH ONCE DAILY AS DIRECTED.  TAKE 1 HOUR BEFORE OR 2 HOURS AFTER A MEAL 120 tablet 0   No facility-administered medications prior to visit.     ROS: Review of Systems  Constitutional: Negative for appetite change, fatigue and unexpected weight change.  HENT: Negative for congestion, nosebleeds, sneezing, sore throat and trouble swallowing.   Eyes: Negative for itching and visual disturbance.  Respiratory: Negative for cough.   Cardiovascular: Negative for chest pain, palpitations and leg swelling.  Gastrointestinal: Negative for abdominal distention, blood in stool, diarrhea and nausea.  Genitourinary: Positive for frequency and hematuria.  Musculoskeletal: Negative for back pain, gait problem, joint swelling and neck pain.  Skin: Negative for rash.  Neurological: Negative for dizziness, tremors, speech difficulty and weakness.  Psychiatric/Behavioral: Negative for agitation, dysphoric mood and sleep disturbance. The patient is not nervous/anxious.     Objective:  BP (!) 156/94 (BP Location: Left Arm, Patient  Position: Sitting, Cuff Size: Normal)   Pulse 70   Temp 98.2 F (36.8 C) (Oral)   Ht 6' 0.5" (1.842 m)   Wt 199 lb (90.3 kg)   SpO2 96%   BMI 26.62 kg/m   BP Readings from Last 3 Encounters:  04/09/18 (!) 156/94  04/06/18 (!) 176/89  02/26/18 (!) 191/86    Wt Readings from Last 3 Encounters:  04/09/18 199 lb (90.3 kg)  04/06/18 202 lb 12.8 oz (92 kg)  02/26/18 200 lb 8 oz (90.9 kg)    Physical Exam Constitutional:      General: He is not in acute distress.    Appearance: Normal appearance. He is well-developed.     Comments: NAD  Eyes:     Conjunctiva/sclera: Conjunctivae normal.     Pupils: Pupils are equal, round, and reactive to light.  Neck:     Musculoskeletal: Normal range of motion.     Thyroid: No thyromegaly.     Vascular: No JVD.  Cardiovascular:     Rate and Rhythm: Normal rate and regular rhythm.     Heart sounds: Normal heart sounds. No murmur. No friction rub. No gallop.   Pulmonary:     Effort: Pulmonary effort is normal. No respiratory distress.     Breath sounds: Normal breath sounds. No wheezing or rales.  Chest:     Chest wall: No tenderness.  Abdominal:     General: Bowel sounds are normal. There is no distension.     Palpations: Abdomen is soft. There is no mass.  Tenderness: There is no abdominal tenderness. There is no guarding or rebound.  Musculoskeletal: Normal range of motion.        General: No tenderness.  Lymphadenopathy:     Cervical: No cervical adenopathy.  Skin:    General: Skin is warm and dry.     Findings: No rash.  Neurological:     Mental Status: He is alert and oriented to person, place, and time.     Cranial Nerves: No cranial nerve deficit.     Motor: No abnormal muscle tone.     Coordination: Coordination normal.     Gait: Gait normal.     Deep Tendon Reflexes: Reflexes are normal and symmetric.  Psychiatric:        Behavior: Behavior normal.        Thought Content: Thought content normal.        Judgment:  Judgment normal.     Lab Results  Component Value Date   WBC 8.6 01/15/2018   HGB 13.1 01/15/2018   HCT 41.3 01/15/2018   PLT 300 01/15/2018   GLUCOSE 111 (H) 04/06/2018   CHOL 164 10/21/2015   TRIG 169 (H) 02/26/2018   HDL 52.20 10/21/2015   LDLCALC 88 10/21/2015   ALT 8 04/06/2018   AST 17 04/06/2018   NA 143 04/06/2018   K 3.3 (L) 04/06/2018   CL 110 04/06/2018   CREATININE 0.82 04/06/2018   BUN 10 04/06/2018   CO2 26 04/06/2018   TSH 1.26 10/21/2015   PSA 0.46 01/10/2007   INR 0.99 09/20/2017   HGBA1C 6.1 (H) 01/10/2007    Dg C-arm 1-60 Min-no Report  Result Date: 01/04/2018 Fluoroscopy was utilized by the requesting physician.  No radiographic interpretation.    Assessment & Plan:   There are no diagnoses linked to this encounter.   No orders of the defined types were placed in this encounter.    Follow-up: No follow-ups on file.  [name], MD

## 2018-04-09 NOTE — Assessment & Plan Note (Signed)
Boost, more protein in diet

## 2018-04-09 NOTE — Assessment & Plan Note (Signed)
KCl 

## 2018-04-09 NOTE — Assessment & Plan Note (Signed)
S/p stent. Continue surveillance. 

## 2018-04-09 NOTE — Patient Instructions (Signed)
[facility]  Located in: [facility]  Address: [address] [place] [address], [place], [place] [address]

## 2018-04-09 NOTE — Assessment & Plan Note (Signed)
Labs F/u w/Dr Diona Fanti

## 2018-04-10 LAB — IRON,TIBC AND FERRITIN PANEL
%SAT: 28 % (calc) (ref 20–48)
Ferritin: 67 ng/mL (ref 24–380)
Iron: 82 ug/dL (ref 50–180)
TIBC: 298 mcg/dL (calc) (ref 250–425)

## 2018-04-12 DIAGNOSIS — N3000 Acute cystitis without hematuria: Secondary | ICD-10-CM | POA: Diagnosis not present

## 2018-04-12 DIAGNOSIS — R310 Gross hematuria: Secondary | ICD-10-CM | POA: Diagnosis not present

## 2018-04-18 ENCOUNTER — Other Ambulatory Visit: Payer: Self-pay | Admitting: Oncology

## 2018-04-27 DIAGNOSIS — C7951 Secondary malignant neoplasm of bone: Secondary | ICD-10-CM | POA: Diagnosis not present

## 2018-04-27 DIAGNOSIS — C61 Malignant neoplasm of prostate: Secondary | ICD-10-CM | POA: Diagnosis not present

## 2018-04-27 DIAGNOSIS — R310 Gross hematuria: Secondary | ICD-10-CM | POA: Diagnosis not present

## 2018-05-08 ENCOUNTER — Other Ambulatory Visit: Payer: Self-pay | Admitting: Internal Medicine

## 2018-05-21 ENCOUNTER — Inpatient Hospital Stay (HOSPITAL_BASED_OUTPATIENT_CLINIC_OR_DEPARTMENT_OTHER): Payer: Medicare Other | Admitting: Oncology

## 2018-05-21 ENCOUNTER — Telehealth: Payer: Self-pay

## 2018-05-21 ENCOUNTER — Inpatient Hospital Stay: Payer: Medicare Other | Attending: Oncology

## 2018-05-21 VITALS — BP 176/84 | HR 70 | Temp 98.9°F | Resp 19 | Ht 72.5 in | Wt 198.6 lb

## 2018-05-21 DIAGNOSIS — C7951 Secondary malignant neoplasm of bone: Secondary | ICD-10-CM | POA: Insufficient documentation

## 2018-05-21 DIAGNOSIS — N1330 Unspecified hydronephrosis: Secondary | ICD-10-CM | POA: Insufficient documentation

## 2018-05-21 DIAGNOSIS — I10 Essential (primary) hypertension: Secondary | ICD-10-CM

## 2018-05-21 DIAGNOSIS — C61 Malignant neoplasm of prostate: Secondary | ICD-10-CM

## 2018-05-21 LAB — CMP (CANCER CENTER ONLY)
ALT: 11 U/L (ref 0–44)
AST: 20 U/L (ref 15–41)
Albumin: 3.4 g/dL — ABNORMAL LOW (ref 3.5–5.0)
Alkaline Phosphatase: 285 U/L — ABNORMAL HIGH (ref 38–126)
Anion gap: 7 (ref 5–15)
BUN: 14 mg/dL (ref 8–23)
CO2: 27 mmol/L (ref 22–32)
Calcium: 8.9 mg/dL (ref 8.9–10.3)
Chloride: 108 mmol/L (ref 98–111)
Creatinine: 0.93 mg/dL (ref 0.61–1.24)
GFR, Est AFR Am: >60 mL/min (ref >60)
Glucose, Bld: 112 mg/dL — ABNORMAL HIGH (ref 70–99)
Potassium: 3.6 mmol/L (ref 3.5–5.1)
Sodium: 142 mmol/L (ref 135–145)
Total Bilirubin: 0.5 mg/dL (ref 0.3–1.2)
Total Protein: 6.2 g/dL — ABNORMAL LOW (ref 6.5–8.1)

## 2018-05-21 LAB — CBC WITH DIFFERENTIAL (CANCER CENTER ONLY)
Abs Immature Granulocytes: 0.02 10*3/uL (ref 0.00–0.07)
BASOS PCT: 0 %
Basophils Absolute: 0 10*3/uL (ref 0.0–0.1)
Eosinophils Absolute: 0.1 10*3/uL (ref 0.0–0.5)
Eosinophils Relative: 2 %
HCT: 39.1 % (ref 39.0–52.0)
Hemoglobin: 12.5 g/dL — ABNORMAL LOW (ref 13.0–17.0)
Immature Granulocytes: 0 %
Lymphocytes Relative: 18 %
Lymphs Abs: 1 10*3/uL (ref 0.7–4.0)
MCH: 31.5 pg (ref 26.0–34.0)
MCHC: 32 g/dL (ref 30.0–36.0)
MCV: 98.5 fL (ref 80.0–100.0)
MONOS PCT: 11 %
Monocytes Absolute: 0.6 10*3/uL (ref 0.1–1.0)
NEUTROS PCT: 69 %
Neutro Abs: 3.7 10*3/uL (ref 1.7–7.7)
Platelet Count: 203 10*3/uL (ref 150–400)
RBC: 3.97 MIL/uL — ABNORMAL LOW (ref 4.22–5.81)
RDW: 12.8 % (ref 11.5–15.5)
WBC Count: 5.4 10*3/uL (ref 4.0–10.5)
nRBC: 0 % (ref 0.0–0.2)

## 2018-05-21 LAB — TRIGLYCERIDES: Triglycerides: 110 mg/dL (ref <150)

## 2018-05-21 NOTE — Telephone Encounter (Signed)
Printed avs and calender of upcoming appointment. Per 2/17 los 

## 2018-05-21 NOTE — Progress Notes (Signed)
  [facility] OFFICE PROGRESS NOTE   Diagnosis: Prostate cancer  INTERVAL HISTORY:   Jorge [name] returns as scheduled.  He continues abiraterone/prednisone.  He feels well.  Good appetite and energy level.  No pain.  No complaint.  He continues every 25-month Lupron with Dr. Diona Fanti.  Objective:  Vital signs in last 24 hours:  Blood pressure (!) 180/108, pulse 70, temperature 98.9 F (37.2 C), temperature source Oral, resp. rate 19, height 6' 0.5" (1.842 m), weight 198 lb 9.6 oz (90.1 kg), SpO2 98 %.    Lymphatics: No cervical, supraclavicular, axillary, or inguinal nodes Resp: Lungs clear bilaterally Cardio: Regular rate and rhythm GI: No hepatomegaly, nontender Vascular: No leg edema   Lab Results:  Lab Results  Component Value Date   WBC 5.4 05/21/2018   HGB 12.5 (L) 05/21/2018   HCT 39.1 05/21/2018   MCV 98.5 05/21/2018   PLT 203 05/21/2018   NEUTROABS 3.7 05/21/2018    CMP  Lab Results  Component Value Date   NA 142 05/21/2018   K 3.6 05/21/2018   CL 108 05/21/2018   CO2 27 05/21/2018   GLUCOSE 112 (H) 05/21/2018   BUN 14 05/21/2018   CREATININE 0.93 05/21/2018   CALCIUM 8.9 05/21/2018   PROT 6.2 (L) 05/21/2018   ALBUMIN 3.4 (L) 05/21/2018   AST 20 05/21/2018   ALT 11 05/21/2018   ALKPHOS 285 (H) 05/21/2018   BILITOT 0.5 05/21/2018   GFRNONAA >60 05/21/2018   GFRAA >60 05/21/2018     Medications: I have reviewed the patient's current medications.   Assessment/Plan:  1. Prostate cancer, Gleason 7, status post a prostatectomy in 2008  Elevated PSA 2011, status post external beam radiation and androgen deprivation therapy, radiation completed March 2011, last injection July 2011  Elevated PSA October 2016, CT with periaortic and obturator lymphadenopathy  Androgen deprivation therapy resumed and continues, now on every 16-month Lupron  Bone scan 04/18/2017-new foci of metastatic disease at the right supra-acetabulum, left  acetabulum, low cervical spine, and L2  CT 04/18/2017-left iliac, periaortic, and retrocrural lymphadenopathy  Bone scan 12/05/2017- progressive bone metastases; new left hydronephrosis and proximal hydroureter.  CT abdomen/pelvis 12/30/2017-moderate left hydronephrosis with moderate dilatation of the left renal pelvis and proximal left ureter without obstructing calculus. Stable large hiatal hernia. Stable sclerotic lesions in the pelvis and L3 vertebral body concerning for metastatic disease. No enlarged abdominal or pelvic lymph nodes.  Abiraterone/prednisone started 02/07/2018  PSA improved 02/26/2018, higher on 04/06/2018  2.Hypertension  3.Recurrent hiatal hernia-status post gastrostomy tube placement 09/20/2017  4.Left hydronephrosis status post placement of double-J stent 01/04/2018.    Disposition: Jorge [name] appears stable.  He will continue abiraterone and prednisone.  We will follow-up on the PSA from today.  He continues Lupron with Dr. Diona Fanti.  He will return for an office and lab visit in 2 months.  Betsy Coder, MD  05/21/2018  10:51 AM

## 2018-05-22 LAB — PROSTATE-SPECIFIC AG, SERUM (LABCORP): Prostate Specific Ag, Serum: 72.5 ng/mL — ABNORMAL HIGH (ref 0.0–4.0)

## 2018-05-24 ENCOUNTER — Telehealth: Payer: Self-pay | Admitting: *Deleted

## 2018-05-24 NOTE — Telephone Encounter (Signed)
Notified of improvement in PSA result. Follow up as scheduled.

## 2018-05-24 NOTE — Telephone Encounter (Signed)
-----   Message from Ladell Pier, MD sent at 05/22/2018  6:12 PM EST ----- Please call patient, psa is better

## 2018-05-28 ENCOUNTER — Ambulatory Visit: Payer: Medicare Other | Admitting: Internal Medicine

## 2018-06-25 ENCOUNTER — Other Ambulatory Visit: Payer: Self-pay

## 2018-06-25 ENCOUNTER — Encounter: Payer: Self-pay | Admitting: Internal Medicine

## 2018-06-25 ENCOUNTER — Ambulatory Visit: Payer: Medicare Other | Admitting: Internal Medicine

## 2018-06-25 VITALS — BP 180/88 | HR 71 | Temp 98.0°F | Ht 72.5 in | Wt 199.0 lb

## 2018-06-25 DIAGNOSIS — E559 Vitamin D deficiency, unspecified: Secondary | ICD-10-CM

## 2018-06-25 DIAGNOSIS — C61 Malignant neoplasm of prostate: Secondary | ICD-10-CM

## 2018-06-25 DIAGNOSIS — K449 Diaphragmatic hernia without obstruction or gangrene: Secondary | ICD-10-CM | POA: Diagnosis not present

## 2018-06-25 DIAGNOSIS — I10 Essential (primary) hypertension: Secondary | ICD-10-CM

## 2018-06-25 MED ORDER — AMLODIPINE BESYLATE 5 MG PO TABS: 5.0000 mg | ORAL_TABLET | Freq: Every day | ORAL | 3 refills | Status: DC

## 2018-06-25 NOTE — Patient Instructions (Signed)

## 2018-06-25 NOTE — Assessment & Plan Note (Signed)
Gastroplexy 3/13 Dr Hassell Done, s/p

## 2018-06-25 NOTE — Assessment & Plan Note (Signed)
On Zytiga

## 2018-06-25 NOTE — Assessment & Plan Note (Addendum)
Worse Add amlodipine NAS diet

## 2018-06-25 NOTE — Progress Notes (Signed)
Subjective:  Patient ID: Jorge [name], male    DOB: 02-25-1930  Age: 85 y.o. MRN: [id number]  CC: No chief complaint on file.   HPI ZIQUAN FIDEL presents for HTN - elevated BP; prostate cancer, hypokalemia f/u C/o elevated BP  Outpatient Medications Prior to Visit  Medication Sig Dispense Refill  . Docusate Calcium (STOOL SOFTENER PO) Take 1 tablet by mouth at bedtime.     [name] losartan (COZAAR) 100 MG tablet TAKE 1 TABLET BY MOUTH ONCE DAILY 90 tablet 1  . polyethylene glycol powder (GLYCOLAX/MIRALAX) powder Take 17 g by mouth 2 (two) times daily as needed for mild constipation or moderate constipation. 500 g 3  . potassium chloride (KLOR-CON 10) 10 MEQ tablet Take 1 tablet (10 mEq total) by mouth daily. 30 tablet 2  . predniSONE (DELTASONE) 5 MG tablet Take 1 tablet (5 mg total) by mouth daily with breakfast. 90 tablet 3  . ZYTIGA 250 MG tablet TAKE 4 TABLETS BY MOUTH ONCE DAILY AS DIRECTED.  TAKE 1 HOUR BEFORE OR 2 HOURS AFTER A MEAL 120 tablet 11   No facility-administered medications prior to visit.     ROS: Review of Systems  Constitutional: Positive for fatigue. Negative for appetite change and unexpected weight change.  HENT: Negative for congestion, nosebleeds, sneezing, sore throat and trouble swallowing.   Eyes: Negative for itching and visual disturbance.  Respiratory: Negative for cough.   Cardiovascular: Negative for chest pain, palpitations and leg swelling.  Gastrointestinal: Negative for abdominal distention, blood in stool, diarrhea and nausea.  Genitourinary: Negative for frequency and hematuria.  Musculoskeletal: Negative for back pain, gait problem, joint swelling and neck pain.  Skin: Negative for rash.  Neurological: Negative for dizziness, tremors, speech difficulty and weakness.  Psychiatric/Behavioral: Negative for agitation, dysphoric mood, sleep disturbance and suicidal ideas. The patient is not nervous/anxious.     Objective:  BP (!) 180/88  (BP Location: Left Arm, Patient Position: Sitting, Cuff Size: Large)   Pulse 71   Temp 98 F (36.7 C) (Oral)   Ht 6' 0.5" (1.842 m)   Wt 199 lb (90.3 kg)   SpO2 96%   BMI 26.62 kg/m   BP Readings from Last 3 Encounters:  06/25/18 (!) 180/88  05/21/18 (!) 176/84  04/09/18 (!) 156/94    Wt Readings from Last 3 Encounters:  06/25/18 199 lb (90.3 kg)  05/21/18 198 lb 9.6 oz (90.1 kg)  04/09/18 199 lb (90.3 kg)    Physical Exam Constitutional:      General: He is not in acute distress.    Appearance: He is well-developed.     Comments: NAD  Eyes:     Conjunctiva/sclera: Conjunctivae normal.     Pupils: Pupils are equal, round, and reactive to light.  Neck:     Musculoskeletal: Normal range of motion.     Thyroid: No thyromegaly.     Vascular: No JVD.  Cardiovascular:     Rate and Rhythm: Normal rate and regular rhythm.     Heart sounds: Normal heart sounds. No murmur. No friction rub. No gallop.   Pulmonary:     Effort: Pulmonary effort is normal. No respiratory distress.     Breath sounds: Normal breath sounds. No wheezing or rales.  Chest:     Chest wall: No tenderness.  Abdominal:     General: Bowel sounds are normal. There is no distension.     Palpations: Abdomen is soft. There is no mass.  Tenderness: There is no abdominal tenderness. There is no guarding or rebound.  Musculoskeletal: Normal range of motion.        General: No tenderness.  Lymphadenopathy:     Cervical: No cervical adenopathy.  Skin:    General: Skin is warm and dry.     Findings: No rash.  Neurological:     Mental Status: He is alert and oriented to person, place, and time.     Cranial Nerves: No cranial nerve deficit.     Motor: No abnormal muscle tone.     Coordination: Coordination normal.     Gait: Gait normal.     Deep Tendon Reflexes: Reflexes are normal and symmetric.  Psychiatric:        Behavior: Behavior normal.        Thought Content: Thought content normal.         Judgment: Judgment normal.     Lab Results  Component Value Date   WBC 5.4 05/21/2018   HGB 12.5 (L) 05/21/2018   HCT 39.1 05/21/2018   PLT 203 05/21/2018   GLUCOSE 112 (H) 05/21/2018   CHOL 164 10/21/2015   TRIG 110 05/21/2018   HDL 52.20 10/21/2015   LDLCALC 88 10/21/2015   ALT 11 05/21/2018   AST 20 05/21/2018   NA 142 05/21/2018   K 3.6 05/21/2018   CL 108 05/21/2018   CREATININE 0.93 05/21/2018   BUN 14 05/21/2018   CO2 27 05/21/2018   TSH 1.26 10/21/2015   PSA 0.46 01/10/2007   INR 0.9 04/09/2018   HGBA1C 6.1 (H) 01/10/2007    Dg C-arm 1-60 Min-no Report  Result Date: 01/04/2018 Fluoroscopy was utilized by the requesting physician.  No radiographic interpretation.    Assessment & Plan:   There are no diagnoses linked to this encounter.   No orders of the defined types were placed in this encounter.    Follow-up: No follow-ups on file.  [name], MD

## 2018-06-25 NOTE — Assessment & Plan Note (Signed)
On Vit D 

## 2018-07-09 ENCOUNTER — Other Ambulatory Visit: Payer: Self-pay | Admitting: Internal Medicine

## 2018-07-17 ENCOUNTER — Other Ambulatory Visit: Payer: Self-pay | Admitting: Nurse Practitioner

## 2018-07-17 ENCOUNTER — Telehealth: Payer: Self-pay

## 2018-07-17 DIAGNOSIS — C61 Malignant neoplasm of prostate: Secondary | ICD-10-CM

## 2018-07-17 NOTE — Telephone Encounter (Signed)
Per Owens Shark NP Pt's Lab and office visit rescheduled 1 month from original appointment, which was 07/23/18. Pt. Informed, Verbalized understanding. Requested that new appointments be the same times. Schedule message sent with this request.

## 2018-07-18 ENCOUNTER — Telehealth: Payer: Self-pay | Admitting: Nurse Practitioner

## 2018-07-18 NOTE — Telephone Encounter (Signed)
R/s appt per 4/14 sch message - unable to reach patient or leave message - sent letter in the mail with appt date and time

## 2018-07-23 ENCOUNTER — Other Ambulatory Visit: Payer: Medicare Other

## 2018-07-23 ENCOUNTER — Ambulatory Visit: Payer: Medicare Other | Admitting: Nurse Practitioner

## 2018-07-30 ENCOUNTER — Ambulatory Visit: Payer: Medicare Other | Admitting: Oncology

## 2018-08-06 ENCOUNTER — Ambulatory Visit: Payer: Medicare Other | Admitting: Internal Medicine

## 2018-08-20 ENCOUNTER — Telehealth: Payer: Self-pay | Admitting: Nurse Practitioner

## 2018-08-20 ENCOUNTER — Inpatient Hospital Stay (HOSPITAL_BASED_OUTPATIENT_CLINIC_OR_DEPARTMENT_OTHER): Payer: Medicare Other | Admitting: Nurse Practitioner

## 2018-08-20 ENCOUNTER — Inpatient Hospital Stay: Payer: Medicare Other | Attending: Oncology

## 2018-08-20 ENCOUNTER — Encounter: Payer: Self-pay | Admitting: Nurse Practitioner

## 2018-08-20 ENCOUNTER — Other Ambulatory Visit: Payer: Self-pay

## 2018-08-20 VITALS — BP 147/85 | HR 72 | Temp 98.3°F | Resp 18 | Ht 72.5 in | Wt 197.9 lb

## 2018-08-20 DIAGNOSIS — N1330 Unspecified hydronephrosis: Secondary | ICD-10-CM | POA: Insufficient documentation

## 2018-08-20 DIAGNOSIS — I10 Essential (primary) hypertension: Secondary | ICD-10-CM | POA: Diagnosis not present

## 2018-08-20 DIAGNOSIS — Z923 Personal history of irradiation: Secondary | ICD-10-CM

## 2018-08-20 DIAGNOSIS — K449 Diaphragmatic hernia without obstruction or gangrene: Secondary | ICD-10-CM | POA: Diagnosis not present

## 2018-08-20 DIAGNOSIS — R232 Flushing: Secondary | ICD-10-CM | POA: Diagnosis not present

## 2018-08-20 DIAGNOSIS — C7951 Secondary malignant neoplasm of bone: Secondary | ICD-10-CM

## 2018-08-20 DIAGNOSIS — R21 Rash and other nonspecific skin eruption: Secondary | ICD-10-CM

## 2018-08-20 DIAGNOSIS — C61 Malignant neoplasm of prostate: Secondary | ICD-10-CM | POA: Diagnosis not present

## 2018-08-20 LAB — CMP (CANCER CENTER ONLY)
ALT: 10 U/L (ref 0–44)
AST: 17 U/L (ref 15–41)
Albumin: 3.1 g/dL — ABNORMAL LOW (ref 3.5–5.0)
Alkaline Phosphatase: 253 U/L — ABNORMAL HIGH (ref 38–126)
Anion gap: 7 (ref 5–15)
BUN: 12 mg/dL (ref 8–23)
CO2: 26 mmol/L (ref 22–32)
Calcium: 8.8 mg/dL — ABNORMAL LOW (ref 8.9–10.3)
Chloride: 111 mmol/L (ref 98–111)
Creatinine: 1 mg/dL (ref 0.61–1.24)
GFR, Est AFR Am: >60 mL/min (ref >60)
GFR, Estimated: >60 mL/min (ref >60)
Glucose, Bld: 78 mg/dL (ref 70–99)
Potassium: 4.5 mmol/L (ref 3.5–5.1)
Sodium: 144 mmol/L (ref 135–145)
Total Bilirubin: 0.4 mg/dL (ref 0.3–1.2)
Total Protein: 6.3 g/dL — ABNORMAL LOW (ref 6.5–8.1)

## 2018-08-20 LAB — CBC WITH DIFFERENTIAL (CANCER CENTER ONLY)
Abs Immature Granulocytes: 0.03 10*3/uL (ref 0.00–0.07)
Basophils Absolute: 0 10*3/uL (ref 0.0–0.1)
Basophils Relative: 0 %
Eosinophils Absolute: 0.1 10*3/uL (ref 0.0–0.5)
Eosinophils Relative: 1 %
HCT: 40.4 % (ref 39.0–52.0)
Hemoglobin: 12.9 g/dL — ABNORMAL LOW (ref 13.0–17.0)
Immature Granulocytes: 0 %
Lymphocytes Relative: 16 %
Lymphs Abs: 1.3 10*3/uL (ref 0.7–4.0)
MCH: 31.9 pg (ref 26.0–34.0)
MCHC: 31.9 g/dL (ref 30.0–36.0)
MCV: 99.8 fL (ref 80.0–100.0)
Monocytes Absolute: 0.7 10*3/uL (ref 0.1–1.0)
Monocytes Relative: 10 %
Neutro Abs: 5.6 10*3/uL (ref 1.7–7.7)
Neutrophils Relative %: 73 %
Platelet Count: 210 10*3/uL (ref 150–400)
RBC: 4.05 MIL/uL — ABNORMAL LOW (ref 4.22–5.81)
RDW: 13.5 % (ref 11.5–15.5)
WBC Count: 7.7 10*3/uL (ref 4.0–10.5)
nRBC: 0 % (ref 0.0–0.2)

## 2018-08-20 NOTE — Progress Notes (Signed)
  Jorge [name] OFFICE PROGRESS NOTE   Diagnosis:  Prostate cancer  INTERVAL HISTORY:   Jorge [name] returns as scheduled.  He continues abiraterone and prednisone.  He receives Lupron every 4 months with Dr. Diona Fanti.  He overall feels well.  He denies pain.  He has a good appetite.  He has intermittent hot flashes.  No nausea or vomiting.  No diarrhea.  He reports losing 2 pounds due to gum surgery in April.  After the gum surgery he developed a rash along the right side of his face.  The rash has improved.  Objective:  Vital signs in last 24 hours:  Blood pressure (!) 147/85, pulse 72, temperature 98.3 F (36.8 C), temperature source Oral, resp. rate 18, height 6' 0.5" (1.842 m), weight 197 lb 14.4 oz (89.8 kg), SpO2 97 %.    GI: Abdomen soft and nontender.  No hepatosplenomegaly. Vascular: No leg edema. Skin: Mildly erythematous rash scattered over the right chin/mandibular region.  Does not appear vesicular.  Appears to be resolving.   Lab Results:  Lab Results  Component Value Date   WBC 7.7 08/20/2018   HGB 12.9 (L) 08/20/2018   HCT 40.4 08/20/2018   MCV 99.8 08/20/2018   PLT 210 08/20/2018   NEUTROABS 5.6 08/20/2018    Imaging:  No results found.  Medications: I have reviewed the patient's current medications.  Assessment/Plan: 1. Prostate cancer, Gleason 7, status post a prostatectomy in 2008  Elevated PSA 2011, status post external beam radiation and androgen deprivation therapy, radiation completed March 2011, last injection July 2011  Elevated PSA October 2016, CT with periaortic and obturator lymphadenopathy  Androgen deprivation therapy resumed and continues, now on every 46-month Lupron  Bone scan 04/18/2017-new foci of metastatic disease at the right supra-acetabulum, left acetabulum, low cervical spine, and L2  CT 04/18/2017-left iliac, periaortic, and retrocrural lymphadenopathy  Bone scan 12/05/2017- progressive bone metastases; new  left hydronephrosis and proximal hydroureter.  CT abdomen/pelvis 12/30/2017-moderate left hydronephrosis with moderate dilatation of the left renal pelvis and proximal left ureter without obstructing calculus. Stable large hiatal hernia. Stable sclerotic lesions in the pelvis and L3 vertebral body concerning for metastatic disease. No enlarged abdominal or pelvic lymph nodes.  Abiraterone/prednisone started 02/07/2018  PSA improved 02/26/2018, higher on 04/06/2018, improved 05/21/2018  2.Hypertension  3.Recurrent hiatal hernia-status post gastrostomy tube placement 09/20/2017  4.Left hydronephrosis status post placement of double-J stent 01/04/2018.   Disposition: Jorge [name] appears stable.  There is no clinical evidence of progression of the prostate cancer.  He will continue abiraterone/prednisone, Lupron.  We will follow-up on the PSA from today.  He will return for lab and follow-up in 3 months.  He will contact the office in the interim with any problems.  Plan reviewed with Dr. Benay Spice.    Ned Card ANP/GNP-BC   08/20/2018  12:07 PM

## 2018-08-20 NOTE — Telephone Encounter (Signed)
Scheduled appt per 5/18 los. ° °A calendar will be mailed out. °

## 2018-08-21 ENCOUNTER — Telehealth: Payer: Self-pay

## 2018-08-21 LAB — PROSTATE-SPECIFIC AG, SERUM (LABCORP): Prostate Specific Ag, Serum: 55.7 ng/mL — ABNORMAL HIGH (ref 0.0–4.0)

## 2018-08-21 NOTE — Telephone Encounter (Signed)
Per Ned Card NP TC to Pt. To inform him of PSA results. Results given to Pt. PSA (55.7) Pt. Verbalized understanding. No further problems or concerns noted.

## 2018-08-21 NOTE — Telephone Encounter (Signed)
-----   Message from Owens Shark, NP sent at 08/21/2018  1:35 PM EDT ----- Please let him know the PSA is lower.  Follow-up as scheduled.

## 2018-08-31 DIAGNOSIS — C61 Malignant neoplasm of prostate: Secondary | ICD-10-CM | POA: Diagnosis not present

## 2018-08-31 DIAGNOSIS — R310 Gross hematuria: Secondary | ICD-10-CM | POA: Diagnosis not present

## 2018-08-31 DIAGNOSIS — N130 Hydronephrosis with ureteropelvic junction obstruction: Secondary | ICD-10-CM | POA: Diagnosis not present

## 2018-09-04 ENCOUNTER — Other Ambulatory Visit: Payer: Self-pay | Admitting: *Deleted

## 2018-09-04 DIAGNOSIS — C61 Malignant neoplasm of prostate: Secondary | ICD-10-CM

## 2018-09-04 NOTE — Progress Notes (Signed)
MD review of 08/31/18 office note from urology. Will check testosterone level with next lab.

## 2018-09-19 ENCOUNTER — Other Ambulatory Visit: Payer: Self-pay

## 2018-09-19 ENCOUNTER — Encounter: Payer: Self-pay | Admitting: Internal Medicine

## 2018-09-19 ENCOUNTER — Ambulatory Visit (INDEPENDENT_AMBULATORY_CARE_PROVIDER_SITE_OTHER): Payer: Medicare Other | Admitting: Internal Medicine

## 2018-09-19 DIAGNOSIS — C61 Malignant neoplasm of prostate: Secondary | ICD-10-CM | POA: Diagnosis not present

## 2018-09-19 DIAGNOSIS — E876 Hypokalemia: Secondary | ICD-10-CM

## 2018-09-19 DIAGNOSIS — I10 Essential (primary) hypertension: Secondary | ICD-10-CM

## 2018-09-19 DIAGNOSIS — E559 Vitamin D deficiency, unspecified: Secondary | ICD-10-CM

## 2018-09-19 NOTE — Assessment & Plan Note (Signed)
Zytiga, Lupron, Prdednisone

## 2018-09-19 NOTE — Assessment & Plan Note (Signed)
D/c KCl Check labs in August

## 2018-09-19 NOTE — Progress Notes (Signed)
Subjective:  Patient ID: Jorge [name], male    DOB: 1929-04-20  Age: 85 y.o. MRN: [id number]  CC: No chief complaint on file.   HPI DAMIAN BUCKLES presents for HTN, prostate ca w/mets, Vit D def  Outpatient Medications Prior to Visit  Medication Sig Dispense Refill  . amLODipine (NORVASC) 5 MG tablet Take 1 tablet (5 mg total) by mouth daily. 90 tablet 3  . Docusate Calcium (STOOL SOFTENER PO) Take 1 tablet by mouth at bedtime.     [name] losartan (COZAAR) 100 MG tablet TAKE 1 TABLET BY MOUTH ONCE DAILY 90 tablet 1  . polyethylene glycol powder (GLYCOLAX/MIRALAX) powder Take 17 g by mouth 2 (two) times daily as needed for mild constipation or moderate constipation. 500 g 3  . potassium chloride (K-DUR) 10 MEQ tablet Take 1 tablet by mouth once daily 30 tablet 11  . predniSONE (DELTASONE) 5 MG tablet Take 1 tablet (5 mg total) by mouth daily with breakfast. 90 tablet 3  . ZYTIGA 250 MG tablet TAKE 4 TABLETS BY MOUTH ONCE DAILY AS DIRECTED.  TAKE 1 HOUR BEFORE OR 2 HOURS AFTER A MEAL 120 tablet 11   No facility-administered medications prior to visit.     ROS: Review of Systems  Constitutional: Positive for fatigue. Negative for appetite change and unexpected weight change.  HENT: Negative for congestion, nosebleeds, sneezing, sore throat and trouble swallowing.   Eyes: Negative for itching and visual disturbance.  Respiratory: Negative for cough.   Cardiovascular: Negative for chest pain, palpitations and leg swelling.  Gastrointestinal: Negative for abdominal distention, blood in stool, diarrhea and nausea.  Genitourinary: Negative for frequency and hematuria.  Musculoskeletal: Positive for arthralgias. Negative for back pain, gait problem, joint swelling and neck pain.  Skin: Negative for rash.  Neurological: Negative for dizziness, tremors, speech difficulty and weakness.  Psychiatric/Behavioral: Negative for agitation, dysphoric mood and sleep disturbance. The patient is not  nervous/anxious.     Objective:  BP (!) 142/78 (BP Location: Left Arm, Patient Position: Sitting, Cuff Size: Large)   Pulse 75   Temp 98.2 F (36.8 C) (Oral)   Ht 6' 0.5" (1.842 m)   Wt 201 lb (91.2 kg)   SpO2 98%   BMI 26.89 kg/m   BP Readings from Last 3 Encounters:  09/19/18 (!) 142/78  08/20/18 (!) 147/85  06/25/18 (!) 180/88    Wt Readings from Last 3 Encounters:  09/19/18 201 lb (91.2 kg)  08/20/18 197 lb 14.4 oz (89.8 kg)  06/25/18 199 lb (90.3 kg)    Physical Exam Constitutional:      General: He is not in acute distress.    Appearance: He is well-developed.     Comments: NAD  Eyes:     Conjunctiva/sclera: Conjunctivae normal.     Pupils: Pupils are equal, round, and reactive to light.  Neck:     Musculoskeletal: Normal range of motion.     Thyroid: No thyromegaly.     Vascular: No JVD.  Cardiovascular:     Rate and Rhythm: Normal rate and regular rhythm.     Heart sounds: Normal heart sounds. No murmur. No friction rub. No gallop.   Pulmonary:     Effort: Pulmonary effort is normal. No respiratory distress.     Breath sounds: Normal breath sounds. No wheezing or rales.  Chest:     Chest wall: No tenderness.  Abdominal:     General: Bowel sounds are normal. There is no distension.  Palpations: Abdomen is soft. There is no mass.     Tenderness: There is no abdominal tenderness. There is no guarding or rebound.  Musculoskeletal: Normal range of motion.        General: No tenderness.  Lymphadenopathy:     Cervical: No cervical adenopathy.  Skin:    General: Skin is warm and dry.     Findings: No rash.  Neurological:     Mental Status: He is alert and oriented to person, place, and time.     Cranial Nerves: No cranial nerve deficit.     Motor: No abnormal muscle tone.     Coordination: Coordination normal.     Gait: Gait normal.     Deep Tendon Reflexes: Reflexes are normal and symmetric.  Psychiatric:        Behavior: Behavior normal.         Thought Content: Thought content normal.        Judgment: Judgment normal.     Lab Results  Component Value Date   WBC 7.7 08/20/2018   HGB 12.9 (L) 08/20/2018   HCT 40.4 08/20/2018   PLT 210 08/20/2018   GLUCOSE 78 08/20/2018   CHOL 164 10/21/2015   TRIG 110 05/21/2018   HDL 52.20 10/21/2015   LDLCALC 88 10/21/2015   ALT 10 08/20/2018   AST 17 08/20/2018   NA 144 08/20/2018   K 4.5 08/20/2018   CL 111 08/20/2018   CREATININE 1.00 08/20/2018   BUN 12 08/20/2018   CO2 26 08/20/2018   TSH 1.26 10/21/2015   PSA 0.46 01/10/2007   INR 0.9 04/09/2018   HGBA1C 6.1 (H) 01/10/2007    Dg C-arm 1-60 Min-no Report  Result Date: 01/04/2018 Fluoroscopy was utilized by the requesting physician.  No radiographic interpretation.    Assessment & Plan:   There are no diagnoses linked to this encounter.   No orders of the defined types were placed in this encounter.    Follow-up: No follow-ups on file.  [name], MD

## 2018-09-19 NOTE — Assessment & Plan Note (Signed)
Vit D 

## 2018-09-19 NOTE — Assessment & Plan Note (Addendum)
Losartan and amlodipine D/c KCl

## 2018-11-01 DIAGNOSIS — R32 Unspecified urinary incontinence: Secondary | ICD-10-CM | POA: Diagnosis not present

## 2018-11-05 ENCOUNTER — Other Ambulatory Visit: Payer: Self-pay | Admitting: Internal Medicine

## 2018-11-20 ENCOUNTER — Inpatient Hospital Stay: Payer: Medicare Other | Attending: Oncology

## 2018-11-20 ENCOUNTER — Inpatient Hospital Stay (HOSPITAL_BASED_OUTPATIENT_CLINIC_OR_DEPARTMENT_OTHER): Payer: Medicare Other | Admitting: Oncology

## 2018-11-20 ENCOUNTER — Other Ambulatory Visit: Payer: Self-pay

## 2018-11-20 VITALS — BP 152/68 | HR 73 | Temp 98.2°F | Resp 16 | Ht 72.05 in | Wt 202.2 lb

## 2018-11-20 DIAGNOSIS — C61 Malignant neoplasm of prostate: Secondary | ICD-10-CM

## 2018-11-20 DIAGNOSIS — Z923 Personal history of irradiation: Secondary | ICD-10-CM | POA: Insufficient documentation

## 2018-11-20 DIAGNOSIS — Z79899 Other long term (current) drug therapy: Secondary | ICD-10-CM | POA: Diagnosis not present

## 2018-11-20 DIAGNOSIS — N1330 Unspecified hydronephrosis: Secondary | ICD-10-CM | POA: Insufficient documentation

## 2018-11-20 DIAGNOSIS — I10 Essential (primary) hypertension: Secondary | ICD-10-CM | POA: Insufficient documentation

## 2018-11-20 DIAGNOSIS — C7951 Secondary malignant neoplasm of bone: Secondary | ICD-10-CM | POA: Insufficient documentation

## 2018-11-20 DIAGNOSIS — K449 Diaphragmatic hernia without obstruction or gangrene: Secondary | ICD-10-CM | POA: Insufficient documentation

## 2018-11-20 LAB — CBC WITH DIFFERENTIAL (CANCER CENTER ONLY)
Abs Immature Granulocytes: 0.02 10*3/uL (ref 0.00–0.07)
Basophils Absolute: 0 10*3/uL (ref 0.0–0.1)
Basophils Relative: 0 %
Eosinophils Absolute: 0.1 10*3/uL (ref 0.0–0.5)
Eosinophils Relative: 1 %
HCT: 38.8 % — ABNORMAL LOW (ref 39.0–52.0)
Hemoglobin: 12.8 g/dL — ABNORMAL LOW (ref 13.0–17.0)
Immature Granulocytes: 0 %
Lymphocytes Relative: 10 %
Lymphs Abs: 0.9 10*3/uL (ref 0.7–4.0)
MCH: 32.9 pg (ref 26.0–34.0)
MCHC: 33 g/dL (ref 30.0–36.0)
MCV: 99.7 fL (ref 80.0–100.0)
Monocytes Absolute: 0.6 10*3/uL (ref 0.1–1.0)
Monocytes Relative: 7 %
Neutro Abs: 7.1 10*3/uL (ref 1.7–7.7)
Neutrophils Relative %: 82 %
Platelet Count: 231 10*3/uL (ref 150–400)
RBC: 3.89 MIL/uL — ABNORMAL LOW (ref 4.22–5.81)
RDW: 13 % (ref 11.5–15.5)
WBC Count: 8.8 10*3/uL (ref 4.0–10.5)
nRBC: 0 % (ref 0.0–0.2)

## 2018-11-20 LAB — CMP (CANCER CENTER ONLY)
ALT: 11 U/L (ref 0–44)
AST: 18 U/L (ref 15–41)
Albumin: 3.3 g/dL — ABNORMAL LOW (ref 3.5–5.0)
Alkaline Phosphatase: 236 U/L — ABNORMAL HIGH (ref 38–126)
Anion gap: 8 (ref 5–15)
BUN: 13 mg/dL (ref 8–23)
CO2: 24 mmol/L (ref 22–32)
Calcium: 8.5 mg/dL — ABNORMAL LOW (ref 8.9–10.3)
Chloride: 110 mmol/L (ref 98–111)
Creatinine: 0.85 mg/dL (ref 0.61–1.24)
GFR, Est AFR Am: >60 mL/min (ref >60)
GFR, Estimated: >60 mL/min (ref >60)
Glucose, Bld: 108 mg/dL — ABNORMAL HIGH (ref 70–99)
Potassium: 3.7 mmol/L (ref 3.5–5.1)
Sodium: 142 mmol/L (ref 135–145)
Total Bilirubin: 0.4 mg/dL (ref 0.3–1.2)
Total Protein: 6.2 g/dL — ABNORMAL LOW (ref 6.5–8.1)

## 2018-11-20 NOTE — Progress Notes (Signed)
  [place] OFFICE PROGRESS NOTE   Diagnosis: Prostate cancer  INTERVAL HISTORY:   Mr. Huston continues abiraterone and prednisone.  He feels well.  No pain.  Good appetite and energy level.  No complaint.  He continues every 19-month Lupron with Dr. Diona Fanti.  Objective:  Vital signs in last 24 hours:  Blood pressure (!) 152/68, pulse 73, temperature 98.2 F (36.8 C), temperature source Oral, resp. rate 16, height 6' 0.05" (1.83 m), weight 202 lb 3.2 oz (91.7 kg), SpO2 97 %.    Physical examination not performed today secondary to distancing with the COVID pandemic  Lab Results:  Lab Results  Component Value Date   WBC 8.8 11/20/2018   HGB 12.8 (L) 11/20/2018   HCT 38.8 (L) 11/20/2018   MCV 99.7 11/20/2018   PLT 231 11/20/2018   NEUTROABS 7.1 11/20/2018    CMP  Lab Results  Component Value Date   NA 142 11/20/2018   K 3.7 11/20/2018   CL 110 11/20/2018   CO2 24 11/20/2018   GLUCOSE 108 (H) 11/20/2018   BUN 13 11/20/2018   CREATININE 0.85 11/20/2018   CALCIUM 8.5 (L) 11/20/2018   PROT 6.2 (L) 11/20/2018   ALBUMIN 3.3 (L) 11/20/2018   AST 18 11/20/2018   ALT 11 11/20/2018   ALKPHOS 236 (H) 11/20/2018   BILITOT 0.4 11/20/2018   GFRNONAA >60 11/20/2018   GFRAA >60 11/20/2018     Medications: I have reviewed the patient's current medications.   Assessment/Plan: 1.  Prostate cancer, Gleason 7, status post a prostatectomy in 2008  Elevated PSA 2011, status post external beam radiation and androgen deprivation therapy, radiation completed March 2011, last injection July 2011  Elevated PSA October 2016, CT with periaortic and obturator lymphadenopathy  Androgen deprivation therapy resumed and continues, now on every 16-month Lupron  Bone scan 04/18/2017-new foci of metastatic disease at the right supra-acetabulum, left acetabulum, low cervical spine, and L2  CT 04/18/2017-left iliac, periaortic, and retrocrural lymphadenopathy  Bone scan  12/05/2017- progressive bone metastases; new left hydronephrosis and proximal hydroureter.  CT abdomen/pelvis 12/30/2017-moderate left hydronephrosis with moderate dilatation of the left renal pelvis and proximal left ureter without obstructing calculus. Stable large hiatal hernia. Stable sclerotic lesions in the pelvis and L3 vertebral body concerning for metastatic disease. No enlarged abdominal or pelvic lymph nodes.  Abiraterone/prednisone started 02/07/2018  PSA improved 02/26/2018, higher on 04/06/2018, improved 05/21/2018  2.Hypertension  3.Recurrent hiatal hernia-status post gastrostomy tube placement 09/20/2017  4.Left hydronephrosis status post placement of double-J stent 01/04/2018.   Disposition: Mr. Necaise appears unchanged.  He will continue abiraterone/prednisone.  We will follow-up on the PSA from today.  He would like to wait on a restaging bone scan until after the [place] pandemic.  He continues Lupron therapy with Dr. Diona Fanti.  Mr. Loomer will return for an office and lab visit in 4 months.  Betsy Coder, MD  11/20/2018  12:25 PM

## 2018-11-21 ENCOUNTER — Telehealth: Payer: Self-pay | Admitting: Oncology

## 2018-11-21 ENCOUNTER — Telehealth: Payer: Self-pay

## 2018-11-21 LAB — PROSTATE-SPECIFIC AG, SERUM (LABCORP): Prostate Specific Ag, Serum: 43.3 ng/mL — ABNORMAL HIGH (ref 0.0–4.0)

## 2018-11-21 LAB — TESTOSTERONE: Testosterone: <3 ng/dL — ABNORMAL LOW (ref 264–916)

## 2018-11-21 NOTE — Telephone Encounter (Signed)
Called and left msg. Mailed printout  °

## 2018-11-21 NOTE — Telephone Encounter (Signed)
-----   Message from Ladell Pier, MD sent at 11/21/2018  2:40 PM EDT ----- Please call patient, PSA is better, continue abiraterone/prednisone, follow-up as scheduled

## 2018-11-21 NOTE — Telephone Encounter (Signed)
Spoke with pt , advised per Dr Benay Spice PSA is better and to continue abiratone/prednisone and to follow-up as scheduled. Pt verbalized understanding.

## 2018-12-03 DIAGNOSIS — C7951 Secondary malignant neoplasm of bone: Secondary | ICD-10-CM | POA: Diagnosis not present

## 2018-12-03 DIAGNOSIS — N130 Hydronephrosis with ureteropelvic junction obstruction: Secondary | ICD-10-CM | POA: Diagnosis not present

## 2018-12-03 DIAGNOSIS — Z5111 Encounter for antineoplastic chemotherapy: Secondary | ICD-10-CM | POA: Diagnosis not present

## 2018-12-03 DIAGNOSIS — C61 Malignant neoplasm of prostate: Secondary | ICD-10-CM | POA: Diagnosis not present

## 2018-12-06 ENCOUNTER — Other Ambulatory Visit: Payer: Self-pay | Admitting: Urology

## 2018-12-25 ENCOUNTER — Ambulatory Visit (INDEPENDENT_AMBULATORY_CARE_PROVIDER_SITE_OTHER): Payer: Medicare Other | Admitting: Internal Medicine

## 2018-12-25 ENCOUNTER — Other Ambulatory Visit: Payer: Self-pay

## 2018-12-25 ENCOUNTER — Encounter: Payer: Self-pay | Admitting: Internal Medicine

## 2018-12-25 VITALS — BP 162/94 | HR 76 | Temp 98.4°F | Ht 72.05 in | Wt 201.0 lb

## 2018-12-25 DIAGNOSIS — Z23 Encounter for immunization: Secondary | ICD-10-CM

## 2018-12-25 DIAGNOSIS — E559 Vitamin D deficiency, unspecified: Secondary | ICD-10-CM

## 2018-12-25 DIAGNOSIS — C61 Malignant neoplasm of prostate: Secondary | ICD-10-CM | POA: Diagnosis not present

## 2018-12-25 DIAGNOSIS — F411 Generalized anxiety disorder: Secondary | ICD-10-CM | POA: Diagnosis not present

## 2018-12-25 DIAGNOSIS — I10 Essential (primary) hypertension: Secondary | ICD-10-CM | POA: Diagnosis not present

## 2018-12-25 NOTE — Progress Notes (Signed)
Subjective:  Patient ID: Jorge [name], male    DOB: June 03, 1929  Age: 85 y.o. MRN: [id number]  CC: No chief complaint on file.   HPI HARVINDER KOZUB presents for HTN, prostate cancer, vit D def f/u  Outpatient Medications Prior to Visit  Medication Sig Dispense Refill  . amLODipine (NORVASC) 5 MG tablet Take 1 tablet (5 mg total) by mouth daily. 90 tablet 3  . Docusate Calcium (STOOL SOFTENER PO) Take 1 tablet by mouth at bedtime.     [name] losartan (COZAAR) 100 MG tablet Take 1 tablet by mouth once daily 90 tablet 0  . polyethylene glycol powder (GLYCOLAX/MIRALAX) powder Take 17 g by mouth 2 (two) times daily as needed for mild constipation or moderate constipation. 500 g 3  . predniSONE (DELTASONE) 5 MG tablet Take 1 tablet (5 mg total) by mouth daily with breakfast. 90 tablet 3  . ZYTIGA 250 MG tablet TAKE 4 TABLETS BY MOUTH ONCE DAILY AS DIRECTED.  TAKE 1 HOUR BEFORE OR 2 HOURS AFTER A MEAL 120 tablet 11   No facility-administered medications prior to visit.     ROS: Review of Systems  Constitutional: Positive for fatigue. Negative for appetite change and unexpected weight change.  HENT: Negative for congestion, nosebleeds, sneezing, sore throat and trouble swallowing.   Eyes: Negative for itching and visual disturbance.  Respiratory: Negative for cough.   Cardiovascular: Negative for chest pain, palpitations and leg swelling.  Gastrointestinal: Negative for abdominal distention, blood in stool, diarrhea and nausea.  Genitourinary: Negative for frequency and hematuria.  Musculoskeletal: Positive for arthralgias. Negative for back pain, gait problem, joint swelling and neck pain.  Skin: Negative for rash.  Neurological: Negative for dizziness, tremors, speech difficulty and weakness.  Hematological: Bruises/bleeds easily.  Psychiatric/Behavioral: Negative for agitation, dysphoric mood, sleep disturbance and suicidal ideas. The patient is not nervous/anxious.     Objective:   BP (!) 162/94 (BP Location: Left Arm, Patient Position: Sitting, Cuff Size: Normal)   Pulse 76   Temp 98.4 F (36.9 C) (Oral)   Ht 6' 0.05" (1.83 m)   Wt 201 lb (91.2 kg)   SpO2 96%   BMI 27.22 kg/m   BP Readings from Last 3 Encounters:  12/25/18 (!) 162/94  11/20/18 (!) 152/68  09/19/18 (!) 142/78    Wt Readings from Last 3 Encounters:  12/25/18 201 lb (91.2 kg)  11/20/18 202 lb 3.2 oz (91.7 kg)  09/19/18 201 lb (91.2 kg)    Physical Exam Constitutional:      General: He is not in acute distress.    Appearance: He is well-developed.     Comments: NAD  Eyes:     Conjunctiva/sclera: Conjunctivae normal.     Pupils: Pupils are equal, round, and reactive to light.  Neck:     Musculoskeletal: Normal range of motion.     Thyroid: No thyromegaly.     Vascular: No JVD.  Cardiovascular:     Rate and Rhythm: Normal rate and regular rhythm.     Heart sounds: Normal heart sounds. No murmur. No friction rub. No gallop.   Pulmonary:     Effort: Pulmonary effort is normal. No respiratory distress.     Breath sounds: Normal breath sounds. No wheezing or rales.  Chest:     Chest wall: No tenderness.  Abdominal:     General: Bowel sounds are normal. There is no distension.     Palpations: Abdomen is soft. There is no mass.  Tenderness: There is no abdominal tenderness. There is no guarding or rebound.  Musculoskeletal: Normal range of motion.        General: No tenderness.  Lymphadenopathy:     Cervical: No cervical adenopathy.  Skin:    General: Skin is warm and dry.     Findings: Bruising present. No rash.  Neurological:     Mental Status: He is alert and oriented to person, place, and time.     Cranial Nerves: No cranial nerve deficit.     Motor: No abnormal muscle tone.     Coordination: Coordination normal.     Gait: Gait abnormal.     Deep Tendon Reflexes: Reflexes are normal and symmetric.  Psychiatric:        Behavior: Behavior normal.        Thought Content:  Thought content normal.        Judgment: Judgment normal.     Lab Results  Component Value Date   WBC 8.8 11/20/2018   HGB 12.8 (L) 11/20/2018   HCT 38.8 (L) 11/20/2018   PLT 231 11/20/2018   GLUCOSE 108 (H) 11/20/2018   CHOL 164 10/21/2015   TRIG 110 05/21/2018   HDL 52.20 10/21/2015   LDLCALC 88 10/21/2015   ALT 11 11/20/2018   AST 18 11/20/2018   NA 142 11/20/2018   K 3.7 11/20/2018   CL 110 11/20/2018   CREATININE 0.85 11/20/2018   BUN 13 11/20/2018   CO2 24 11/20/2018   TSH 1.26 10/21/2015   PSA 0.46 01/10/2007   INR 0.9 04/09/2018   HGBA1C 6.1 (H) 01/10/2007    Dg C-arm 1-60 Min-no Report  Result Date: 01/04/2018 Fluoroscopy was utilized by the requesting physician.  No radiographic interpretation.    Assessment & Plan:   There are no diagnoses linked to this encounter.   No orders of the defined types were placed in this encounter.    Follow-up: No follow-ups on file.  [name], MD

## 2018-12-25 NOTE — Assessment & Plan Note (Signed)
Doing fair 

## 2018-12-25 NOTE — Assessment & Plan Note (Signed)
Vit D 

## 2018-12-25 NOTE — Assessment & Plan Note (Signed)
Zytiga, Lupron, Prednisone

## 2018-12-25 NOTE — Assessment & Plan Note (Signed)
Losartan and amlodipine BP ok at home

## 2018-12-26 ENCOUNTER — Telehealth: Payer: Self-pay

## 2018-12-26 NOTE — Telephone Encounter (Signed)
Oral Oncology Patient Advocate Encounter  Fabio Asa is being filled with [employer] patient assistance. This enrollment will expire on 01/31/19.  I called the patient and went over this information with him. He will locate his tax return and give me a call back to set up a time to bring it in and sign the application.  The patient verbalized understanding and great appreciation.  [place] Patient [place] Phone [phone] Fax [phone] 12/26/2018   1:54 PM

## 2019-01-07 NOTE — Telephone Encounter (Signed)
Oral Oncology Patient Advocate Encounter  I received a call from the patient today stating that he will be bringing in his income document tomorrow around 4:00.  Oral Oncology clinic will continue to follow.  [place] Patient [place] Phone [phone] Fax [phone] 01/07/2019   11:03 AM

## 2019-01-10 NOTE — Telephone Encounter (Signed)
Oral Oncology Patient Advocate Encounter  I faxed the completed Zytiga renewal application to [employer] today, 10/8.  This encounter will be updated until final determination.  [place] Patient [place] Phone [phone] Fax [phone] 01/10/2019   8:28 AM

## 2019-01-14 NOTE — Telephone Encounter (Signed)
Oral Oncology Patient Advocate Encounter  Received notification from [place] [employer] [place] Patient Assistance program that patient has been successfully enrolled into their program to receive Zytiga from the manufacturer at $0 out of pocket until 01/11/20.    I called and spoke with patient.  He knows we will have to re-apply.   Patient knows to call the office with questions or concerns.   Oral Oncology Clinic will continue to follow.  [place] Patient [place] Phone [phone] Fax [phone] 01/14/2019    9:52 AM

## 2019-01-16 DIAGNOSIS — N3000 Acute cystitis without hematuria: Secondary | ICD-10-CM | POA: Diagnosis not present

## 2019-01-16 DIAGNOSIS — N130 Hydronephrosis with ureteropelvic junction obstruction: Secondary | ICD-10-CM | POA: Diagnosis not present

## 2019-01-16 DIAGNOSIS — C61 Malignant neoplasm of prostate: Secondary | ICD-10-CM | POA: Diagnosis not present

## 2019-01-16 DIAGNOSIS — R310 Gross hematuria: Secondary | ICD-10-CM | POA: Diagnosis not present

## 2019-01-28 ENCOUNTER — Other Ambulatory Visit (HOSPITAL_COMMUNITY)
Admission: RE | Admit: 2019-01-28 | Discharge: 2019-01-28 | Disposition: A | Discharge status: Home / Self Care | Payer: Medicare Other | Source: Ambulatory Visit | Attending: Urology | Admitting: Urology

## 2019-01-28 DIAGNOSIS — Z20828 Contact with and (suspected) exposure to other viral communicable diseases: Secondary | ICD-10-CM | POA: Diagnosis not present

## 2019-01-28 DIAGNOSIS — Z01812 Encounter for preprocedural laboratory examination: Secondary | ICD-10-CM | POA: Diagnosis not present

## 2019-01-29 ENCOUNTER — Encounter (HOSPITAL_BASED_OUTPATIENT_CLINIC_OR_DEPARTMENT_OTHER): Payer: Self-pay | Admitting: *Deleted

## 2019-01-29 LAB — NOVEL CORONAVIRUS, NAA (HOSP ORDER, SEND-OUT TO REF LAB; TAT 18-24 HRS): SARS-CoV-2, NAA: NOT DETECTED

## 2019-01-30 ENCOUNTER — Other Ambulatory Visit: Payer: Self-pay

## 2019-01-30 ENCOUNTER — Encounter (HOSPITAL_BASED_OUTPATIENT_CLINIC_OR_DEPARTMENT_OTHER): Payer: Self-pay | Admitting: *Deleted

## 2019-01-30 NOTE — Progress Notes (Signed)
Spoke w/ via phone for pre-op interview--- PT Lab needs dos----  Istat 8 and EKG COVID test ------ 01-28-2019 Arrive at ------- 1030 NPO after ------ Mn Medications to take morning of surgery ----- Zytiga, Norvasc w/ sips of water Diabetic medication ----- n/a Patient Special Instructions ----- n/a Pre-Op special Istructions ----- n/a  Patient verbalized understanding of instructions that were given at this phone interview. Patient denies shortness of breath, chest pain, fever, cough a this phone interview.   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  Per pt no issue with gerd or swallowing because he eats small bites and certain medication he eats before taking in order to not cause gerd. Updated hx in epic.  PCP:  Dr Alain Marion Cardiologist : no Oncologist:  Dr Benay Spice GI:  Dr p. Hung  Chest x-ray :  EKG : 09-16-2017  Epic Stress test:  no Echo : 06-06-2011  epic Cardiac Cath :  no Sleep Study/ CPAP : NO  Blood Thinner/ Instructions Maryjane Hurter Dose: NO ASA / Instructions/ Last Dose :  NO

## 2019-01-31 ENCOUNTER — Ambulatory Visit (HOSPITAL_BASED_OUTPATIENT_CLINIC_OR_DEPARTMENT_OTHER): Payer: Medicare Other | Admitting: Anesthesiology

## 2019-01-31 ENCOUNTER — Ambulatory Visit (HOSPITAL_BASED_OUTPATIENT_CLINIC_OR_DEPARTMENT_OTHER)
Admission: RE | Admit: 2019-01-31 | Discharge: 2019-01-31 | Disposition: A | Discharge status: Home / Self Care | Payer: Medicare Other | Source: Ambulatory Visit | Attending: Urology | Admitting: Urology

## 2019-01-31 ENCOUNTER — Other Ambulatory Visit: Payer: Self-pay

## 2019-01-31 ENCOUNTER — Encounter (HOSPITAL_BASED_OUTPATIENT_CLINIC_OR_DEPARTMENT_OTHER): Payer: Self-pay | Admitting: *Deleted

## 2019-01-31 ENCOUNTER — Encounter (HOSPITAL_BASED_OUTPATIENT_CLINIC_OR_DEPARTMENT_OTHER)
Admission: RE | Disposition: A | Discharge status: Home / Self Care | Payer: Self-pay | Source: Ambulatory Visit | Attending: Urology

## 2019-01-31 DIAGNOSIS — Z881 Allergy status to other antibiotic agents status: Secondary | ICD-10-CM | POA: Insufficient documentation

## 2019-01-31 DIAGNOSIS — M1990 Unspecified osteoarthritis, unspecified site: Secondary | ICD-10-CM | POA: Diagnosis not present

## 2019-01-31 DIAGNOSIS — G709 Myoneural disorder, unspecified: Secondary | ICD-10-CM | POA: Diagnosis not present

## 2019-01-31 DIAGNOSIS — Z9079 Acquired absence of other genital organ(s): Secondary | ICD-10-CM | POA: Diagnosis not present

## 2019-01-31 DIAGNOSIS — Z923 Personal history of irradiation: Secondary | ICD-10-CM | POA: Insufficient documentation

## 2019-01-31 DIAGNOSIS — Z8249 Family history of ischemic heart disease and other diseases of the circulatory system: Secondary | ICD-10-CM | POA: Diagnosis not present

## 2019-01-31 DIAGNOSIS — C61 Malignant neoplasm of prostate: Secondary | ICD-10-CM | POA: Diagnosis not present

## 2019-01-31 DIAGNOSIS — I10 Essential (primary) hypertension: Secondary | ICD-10-CM | POA: Diagnosis not present

## 2019-01-31 DIAGNOSIS — N1330 Unspecified hydronephrosis: Secondary | ICD-10-CM | POA: Insufficient documentation

## 2019-01-31 DIAGNOSIS — Z466 Encounter for fitting and adjustment of urinary device: Secondary | ICD-10-CM | POA: Diagnosis not present

## 2019-01-31 DIAGNOSIS — Z880 Allergy status to penicillin: Secondary | ICD-10-CM | POA: Insufficient documentation

## 2019-01-31 DIAGNOSIS — Z886 Allergy status to analgesic agent status: Secondary | ICD-10-CM | POA: Diagnosis not present

## 2019-01-31 DIAGNOSIS — N320 Bladder-neck obstruction: Secondary | ICD-10-CM | POA: Diagnosis not present

## 2019-01-31 DIAGNOSIS — Z888 Allergy status to other drugs, medicaments and biological substances status: Secondary | ICD-10-CM | POA: Insufficient documentation

## 2019-01-31 HISTORY — DX: Unspecified hydronephrosis: N13.30

## 2019-01-31 HISTORY — DX: Unspecified urinary incontinence: R32

## 2019-01-31 HISTORY — DX: Personal history of other specified conditions: Z87.898

## 2019-01-31 HISTORY — DX: Presence of dental prosthetic device (complete) (partial): Z97.2

## 2019-01-31 HISTORY — DX: Malignant neoplasm of prostate: C61

## 2019-01-31 HISTORY — DX: Anemia, unspecified: D64.9

## 2019-01-31 HISTORY — DX: Esophageal obstruction: K22.2

## 2019-01-31 HISTORY — PX: CYSTOSCOPY W/ URETERAL STENT PLACEMENT: SHX1429

## 2019-01-31 HISTORY — DX: Atrioventricular block, first degree: I44.0

## 2019-01-31 LAB — POCT I-STAT, CHEM 8
BUN: 13 mg/dL (ref 8–23)
Calcium, Ion: 1.21 mmol/L (ref 1.15–1.40)
Chloride: 108 mmol/L (ref 98–111)
Creatinine, Ser: 0.8 mg/dL (ref 0.61–1.24)
Glucose, Bld: 100 mg/dL — ABNORMAL HIGH (ref 70–99)
HCT: 39 % (ref 39.0–52.0)
Hemoglobin: 13.3 g/dL (ref 13.0–17.0)
Potassium: 3.3 mmol/L — ABNORMAL LOW (ref 3.5–5.1)
Sodium: 144 mmol/L (ref 135–145)
TCO2: 21 mmol/L — ABNORMAL LOW (ref 22–32)

## 2019-01-31 SURGERY — CYSTOSCOPY, FLEXIBLE, WITH STENT REPLACEMENT
Anesthesia: General | Site: Ureter | Laterality: Left

## 2019-01-31 MED ORDER — FENTANYL CITRATE (PF) 100 MCG/2ML IJ SOLN
INTRAMUSCULAR | Status: DC | PRN
  Administered 2019-01-31: 50 ug via INTRAVENOUS

## 2019-01-31 MED ORDER — DEXAMETHASONE SODIUM PHOSPHATE 10 MG/ML IJ SOLN
INTRAMUSCULAR | Status: DC | PRN
  Administered 2019-01-31: 5 mg via INTRAVENOUS

## 2019-01-31 MED ORDER — STERILE WATER FOR IRRIGATION IR SOLN
Status: DC | PRN
  Administered 2019-01-31: 3000 mL

## 2019-01-31 MED ORDER — CIPROFLOXACIN IN D5W 400 MG/200ML IV SOLN
INTRAVENOUS | Status: AC
  Filled 2019-01-31: qty 200

## 2019-01-31 MED ORDER — PROPOFOL 10 MG/ML IV BOLUS
INTRAVENOUS | Status: DC | PRN
  Administered 2019-01-31: 150 mg via INTRAVENOUS

## 2019-01-31 MED ORDER — LIDOCAINE 2% (20 MG/ML) 5 ML SYRINGE
INTRAMUSCULAR | Status: DC | PRN
  Administered 2019-01-31: 80 mg via INTRAVENOUS

## 2019-01-31 MED ORDER — LACTATED RINGERS IV SOLN
INTRAVENOUS | Status: DC
  Administered 2019-01-31: 11:00:00 via INTRAVENOUS
  Filled 2019-01-31: qty 1000

## 2019-01-31 MED ORDER — ONDANSETRON HCL 4 MG/2ML IJ SOLN
INTRAMUSCULAR | Status: DC | PRN
  Administered 2019-01-31: 4 mg via INTRAVENOUS

## 2019-01-31 MED ORDER — FENTANYL CITRATE (PF) 100 MCG/2ML IJ SOLN
INTRAMUSCULAR | Status: AC
  Filled 2019-01-31: qty 2

## 2019-01-31 MED ORDER — CIPROFLOXACIN IN D5W 400 MG/200ML IV SOLN
400.0000 mg | INTRAVENOUS | Status: AC
  Administered 2019-01-31: 13:00:00 400 mg via INTRAVENOUS
  Filled 2019-01-31: qty 200

## 2019-01-31 SURGICAL SUPPLY — 16 items
BAG DRAIN URO-CYSTO SKYTR STRL (DRAIN) ×2 IMPLANT
CATH INTERMIT  6FR 70CM (CATHETERS) IMPLANT
CLOTH BEACON ORANGE TIMEOUT ST (SAFETY) ×2 IMPLANT
GLOVE BIO SURGEON STRL SZ8 (GLOVE) ×2 IMPLANT
GLOVE BIOGEL PI IND STRL 6.5 (GLOVE) IMPLANT
GLOVE BIOGEL PI INDICATOR 6.5 (GLOVE) ×2
GOWN STRL REUS W/ TWL LRG LVL3 (GOWN DISPOSABLE) IMPLANT
GOWN STRL REUS W/ TWL XL LVL3 (GOWN DISPOSABLE) ×1 IMPLANT
GOWN STRL REUS W/TWL LRG LVL3 (GOWN DISPOSABLE) ×1
GOWN STRL REUS W/TWL XL LVL3 (GOWN DISPOSABLE) ×1
GUIDEWIRE STR DUAL SENSOR (WIRE) ×1 IMPLANT
KIT TURNOVER CYSTO (KITS) ×2 IMPLANT
MANIFOLD NEPTUNE II (INSTRUMENTS) ×1 IMPLANT
PACK CYSTO (CUSTOM PROCEDURE TRAY) ×2 IMPLANT
STENT URET 6FRX26 CONTOUR (STENTS) ×1 IMPLANT
TUBE CONNECTING 12X1/4 (SUCTIONS) ×1 IMPLANT

## 2019-01-31 NOTE — Anesthesia Preprocedure Evaluation (Addendum)
Anesthesia Evaluation  Patient identified by MRN, date of birth, ID band Patient awake    Reviewed: Allergy & Precautions, NPO status , Patient's Chart, lab work & pertinent test results  Airway Mallampati: II  TM Distance: >3 FB     Dental   Pulmonary    breath sounds clear to auscultation       Cardiovascular hypertension, + dysrhythmias  Rhythm:Regular Rate:Normal     Neuro/Psych Anxiety  Neuromuscular disease    GI/Hepatic hiatal hernia, PUD, GERD  ,  Endo/Other    Renal/GU Renal disease     Musculoskeletal  (+) Arthritis ,   Abdominal   Peds  Hematology  (+) anemia ,   Anesthesia Other Findings   Reproductive/Obstetrics                             Anesthesia Physical Anesthesia Plan  ASA: III  Anesthesia Plan: General   Post-op Pain Management:    Induction: Intravenous  PONV Risk Score and Plan: 2 and Ondansetron and Dexamethasone  Airway Management Planned: LMA  Additional Equipment:   Intra-op Plan:   Post-operative Plan: Extubation in OR  Informed Consent: I have reviewed the patients History and Physical, chart, labs and discussed the procedure including the risks, benefits and alternatives for the proposed anesthesia with the patient or authorized representative who has indicated his/her understanding and acceptance.     Dental advisory given  Plan Discussed with: CRNA and Anesthesiologist  Anesthesia Plan Comments:         Anesthesia Quick Evaluation

## 2019-01-31 NOTE — Transfer of Care (Signed)
Immediate Anesthesia Transfer of Care Note  Patient: Jorge [name]  Procedure(s) Performed: Procedure(s) (LRB): CYSTOSCOPY WITH STENT REPLACEMENT (Left)  Patient Location: PACU  Anesthesia Type: General  Level of Consciousness: awake, oriented, sedated and patient cooperative  Airway & Oxygen Therapy: Patient Spontanous Breathing and Patient connected to face mask oxygen  Post-op Assessment: Report given to PACU RN and Post -op Vital signs reviewed and stable  Post vital signs: Reviewed and stable  Complications: No apparent anesthesia complications  Last Vitals:  Vitals Value Taken Time  BP 177/81 01/31/19 1445  Temp 36.6 C 01/31/19 1318  Pulse 70 01/31/19 1444  Resp 15 01/31/19 1444  SpO2 95 % 01/31/19 1444    Last Pain:  Vitals:   01/31/19 1400  TempSrc:   PainSc: 0-No pain

## 2019-01-31 NOTE — OR Nursing (Signed)
Left ureteral stent was removed by Dr. Dahlstedt 

## 2019-01-31 NOTE — H&P (Signed)
H&P  Chief Complaint: Blocked left kidney  History of Present Illness:  85 year old male presents at this time for left double-J stent exchange.  He has recurrent/progressive prostate cancer, castrate resistant.  Because of local recurrence, he has left hydronephrosis and   He has had a stent placed for management.   Initial stent was placed in October 2019.  He presents at this time for stent change.  Past Medical History:  Diagnosis Date  . Anemia   . Anxiety   . ED (erectile dysfunction)   . First degree heart block   . GERD (gastroesophageal reflux disease)    01-30-2019   per pt no issue since surgery 06/ 2019  . Hernia, hiatal GI-- dr p. hung   hx s/p [facility] repair 03/ 2013,  recurrent w/ gastric outlet obstruction 06/ 2019 s/p  open takedown/ gastrostomy tube;  last EGD 06-15-219 epic ,  large HH  . History of colonic polyps   . History of gastric ulcer 2008   w/ erosive esophagitis  . History of syncope    01/ 2011   per ED visit in epic ,  vasovagal epidose, negative work-up  . Hx of radiation therapy    prostate cancer ,  completed 03/ 2011  . Hydronephrosis of left kidney   . Hydronephrosis, left    due to malignant lymphadenopathy-- treated with ureteral stent  . Hypertension   . Insomnia   . Metastatic castration-resistant adenocarcinoma of prostate [facility]) urologist-- dr Zalyn Amend/  oncologist--- dr Benay Spice   incidental finding pt had suprapubic prostatectoy for BPH on  09-18-2006 found to have Gleason 7 prostate cancer;   completed radiation therapy 03/ 2011 for rising PSA;  recurrent w/ mets 06/ 2018  . Schatzki's ring   . Stricture of esophagus GI--- dr p. hung   (01-30-2019  per pt no issues since surgery 06/ 2019)   tortuous esophagus (noted in epic since 2008)  s/p  repair large [facility] 03/ 2013  recurrent w/ obstruction s/p takedown 06/ 2019  . Urinary incontinence   . Vitamin D deficiency   . Wears dentures    fuller upper and partial lower    Past Surgical History:   Procedure Laterality Date  . APPENDECTOMY  age 45  . CATARACT EXTRACTION W/ INTRAOCULAR LENS  IMPLANT, BILATERAL Bilateral yrs ago  . CYST REMOVAL NECK N/A 10/16/2014   Procedure: EXCISION CYST POSTERIOR NECK;  Surgeon: Autumn Messing III, MD;  Location: [place];  Service: General;  Laterality: N/A;  posterior  . CYSTOSCOPY W/ RETROGRADES Left 01/04/2018   Procedure: CYSTOSCOPY LEFT STENT PLACEMENT;  Surgeon: Franchot Gallo, MD;  Location: [facility];  Service: Urology;  Laterality: Left;  . ESOPHAGOGASTRODUODENOSCOPY  06/05/2011   Procedure: ESOPHAGOGASTRODUODENOSCOPY (EGD);  Surgeon: Gatha Mayer, MD;  Location: [facility] ENDOSCOPY;  Service: Endoscopy;  Laterality: N/A;  . ESOPHAGOGASTRODUODENOSCOPY N/A 09/16/2017   Procedure: ESOPHAGOGASTRODUODENOSCOPY (EGD);  Surgeon: Carol Ada, MD;  Location: [facility];  Service: Endoscopy;  Laterality: N/A;  . GASTROSTOMY N/A 09/20/2017   Procedure: INSERTION OF GASTROSTOMY TUBE;  Surgeon: Stark Klein, MD;  Location: [facility];  Service: General;  Laterality: N/A;  . HIATAL HERNIA REPAIR  06/10/2011   Procedure: LAPAROSCOPIC REPAIR OF HIATAL HERNIA;  Surgeon: Pedro Earls, MD;  Location: [place];  Service: General;  Laterality: N/A;  Laparoscopic repair of paraesophageal hernia.  . SUPRAPUBIC PROSTATECTOMY  09-18-2006   @[facility]     Home Medications:  Allergies as of 01/31/2019  Reactions   Aspirin Other (See Comments)   Ulcerated stomach   Azithromycin Shortness Of Breath   Cefuroxime Axetil Shortness Of Breath   Iodinated Diagnostic Agents Anaphylaxis   Iodine Other (See Comments)   Per pt tremors and dyspnea   Other Other (See Comments)   Twist in esophagus so patient can not take large pills/capsules   Naproxen Other (See Comments)    upset stomach - like with other NSAIDs   Penicillins Rash         Medication List    Notice   Cannot display discharge medications because the patient has not yet been admitted.      Allergies:  Allergies  Allergen Reactions  . Aspirin Other (See Comments)    Ulcerated stomach  . Azithromycin Shortness Of Breath  . Cefuroxime Axetil Shortness Of Breath  . Iodinated Diagnostic Agents Anaphylaxis  . Iodine Other (See Comments)    Per pt tremors and dyspnea  . Other Other (See Comments)    Twist in esophagus so patient can not take large pills/capsules  . Naproxen Other (See Comments)     upset stomach - like with other NSAIDs  . Penicillins Rash         Family History  Problem Relation Age of Onset  . Pancreatic cancer Father   . Hypertension Father   . Cancer Father   . Hypertension Other     Social History:  reports that he has never smoked. He has never used smokeless tobacco. He reports previous alcohol use. He reports that he does not use drugs.  ROS: A complete review of systems was performed.  All systems are negative except for pertinent findings as noted.  Physical Exam:  Vital signs in last 24 hours: Weight:  [91.2 kg] 91.2 kg (10/28 1100) Constitutional:  Alert and oriented, No acute distress Cardiovascular: Regular rate  Respiratory: Normal respiratory effort GI: Abdomen is soft, nontender, nondistended, no abdominal masses. No CVAT.  Genitourinary: Normal male phallus, testes are descended bilaterally and non-tender and without masses, scrotum is normal in appearance without lesions or masses, perineum is normal on inspection. Lymphatic: No lymphadenopathy Neurologic: Grossly intact, no focal deficits Psychiatric: Normal mood and affect  Laboratory Data:  No results for input(s): WBC, HGB, HCT, PLT in the last 72 hours.  No results for input(s): NA, K, CL, GLUCOSE, BUN, CALCIUM, CREATININE in the last 72 hours.  Invalid input(s): CO3   No results found for this or any previous visit (from the past 24 hour(s)). Recent Results (from the past 240 hour(s))  Novel Coronavirus, NAA (Hosp order, Send-out to Ref Lab; TAT 18-24 hrs      Status: None   Collection Time: 01/28/19 10:19 AM   Specimen: Nasopharyngeal Swab; Respiratory  Result Value Ref Range Status   SARS-CoV-2, NAA NOT DETECTED NOT DETECTED Final    Comment: (NOTE) This nucleic acid amplification test was developed and its performance characteristics determined by [employer]. Nucleic acid amplification tests include PCR and TMA. This test has not been FDA cleared or approved. This test has been authorized by FDA under an Emergency Use Authorization (EUA). This test is only authorized for the duration of time the declaration that circumstances exist justifying the authorization of the emergency use of in vitro diagnostic tests for detection of SARS-CoV-2 virus and/or diagnosis of COVID-19 infection under section 564(b)(1) of the Act, 21 U.S.C. [id number]) (1), unless the authorization is terminated or revoked sooner. When diagnostic testing is negative, the possibility  of a false negative result should be considered in the context of a patient's recent exposures and the presence of clinical signs and symptoms consistent with COVID-19. An individual without symptoms of COVID- 19 and who is not shedding SARS-CoV-2 vi rus would expect to have a negative (not detected) result in this assay. Performed At: [facility] [address] [place], [place] [id number] Rush Farmer MD [id number]    [place]  Final    Comment: Performed at [facility], [place] [address] [place], [place] [address]    Renal Function: No results for input(s): CREATININE in the last 168 hours. CrCl cannot be calculated (Patient's most recent lab result is older than the maximum 21 days allowed.).  Radiologic Imaging: No results found.  Impression/Assessment:    Left hydronephrosis, status post stenting  Plan:   cystoscopy, left double-J stent exchange

## 2019-01-31 NOTE — Op Note (Signed)
Preoperative diagnosis: Left hydronephrosis, malignant in nature  Postoperative diagnosis: Same  Principal procedure: Cystoscopy, left double-J stent extraction, replacement of left double-J stent--contour, 26 cm x 6 [place] without tether  Surgeon: Evania Lyne  Anesthesia: General with LMA  Complications: None  Specimen: None  Estimated blood loss: None  Indications: 85 year old male with recurrent, castrate resistant prostate cancer.  As a result, he does have left hydronephrosis secondary to encroachment of his left ureter from his prostate cancer.  Original stent was placed in October 2019.  He presents at this time for stent exchange.  I discussed the procedure with the patient, as well as the use of anesthetic with its attendant risks, risk of infection and bleeding.  He understands these and desires to proceed.  Findings: Prostatic fossa was somewhat narrowed, consistent with prior open prostatectomy.  Mild bladder neck contracture, easily passed with the beak of the scope.  Inspection of the bladder revealed stent present in the left ureteral orifice.  Mild/moderate trabeculations throughout the bladder.  No urothelial abnormalities.  Description of procedure: The patient was properly identified and marked in the holding area.  He received preoperative antibiotics, taken to the operating room where general anesthetic was administered with the LMA.  He was placed in the dorsolithotomy position.  Genitalia and perineum were prepped and draped.  Proper timeout was performed.  30 French panendoscope passed under direct vision through his urethra and into his bladder with the above-mentioned findings.  Following circumferential inspection of the bladder, the double-J stent was grasped and the distal end brought out the urethral meatus.  0.038 inch sensor tip guidewire was then passed through the double-J stent, guided into the upper pole calyceal system.  Once a good curl was seen, the old  stent was removed over top of the guidewire.  The guidewire was then backloaded through the scope, and using fluoroscopic assistance, the new stent was advanced over top of the guidewire.  Once the proximal end was within the renal pelvis, and the distal end was protruding through the ureteral orifice, the guidewire was removed and excellent proximal distal curls were seen.  At this point the bladder was drained.  The scope was then removed.  The patient was then awakened and taken to the PACU in stable condition.  He tolerated the procedure well.

## 2019-01-31 NOTE — Discharge Instructions (Signed)
Post Anesthesia Home Care Instructions  Activity: Get plenty of rest for the remainder of the day. A responsible adult should stay with you for 24 hours following the procedure.  For the next 24 hours, DO NOT: -Drive a car -Paediatric nurse -Drink alcoholic beverages -Take any medication unless instructed by your physician -Make any legal decisions or sign important papers.  Meals: Start with liquid foods such as gelatin or soup. Progress to regular foods as tolerated. Avoid greasy, spicy, heavy foods. If nausea and/or vomiting occur, drink only clear liquids until the nausea and/or vomiting subsides. Call your physician if vomiting continues.  Special Instructions/Symptoms: Your throat may feel dry or sore from the anesthesia or the breathing tube placed in your throat during surgery. If this causes discomfort, gargle with warm salt water. The discomfort should disappear within 24 hours.  If you had a scopolamine patch placed behind your ear for the management of post- operative nausea and/or vomiting:  1. The medication in the patch is effective for 72 hours, after which it should be removed.  Wrap patch in a tissue and discard in the trash. Wash hands thoroughly with soap and water. 2. You may remove the patch earlier than 72 hours if you experience unpleasant side effects which may include dry mouth, dizziness or visual disturbances. 3. Avoid touching the patch. Wash your hands with soap and water after contact with the patch.   CYSTOSCOPY HOME CARE INSTRUCTIONS  Activity: Rest for the remainder of the day.  Do not drive or operate equipment today.  You may resume normal activities in one to two days as instructed by your physician.   Meals: Drink plenty of liquids and eat light foods such as gelatin or soup this evening.  You may return to a normal meal plan tomorrow.  Return to Work: You may return to work in one to two days or as instructed by your physician.  Special  Instructions / Symptoms: Call your physician if any of these symptoms occur:   -persistent or heavy bleeding  -bleeding which continues after first few urination  -large blood clots that are difficult to pass  -urine stream diminishes or stops completely  -fever equal to or higher than 101 degrees Farenheit.  -cloudy urine with a strong, foul odor  -severe pain Call for an appointment to arrange follow-up.  Patient Signature:  ________________________________________________________  Nurse's Signature:  ________________________________________________________ 1. You may see some blood in the urine and may have some burning with urination for 48-72 hours. You also may notice that you have to urinate more frequently or urgently after your procedure which is normal.  2. You should call should you develop an inability urinate, fever > 101, persistent nausea and vomiting that prevents you from eating or drinking to stay hydrated.  3. If you have a stent, you will likely urinate more frequently and urgently until the stent is removed and you may experience some discomfort/pain in the lower abdomen and flank especially when urinating. You may take pain medication prescribed to you if needed for pain. You may also intermittently have blood in the urine until the stent is removed. 4. If you have a catheter, you will be taught how to take care of the catheter by the nursing staff prior to discharge from the hospital.  You may periodically feel a strong urge to void with the catheter in place.  This is a bladder spasm and most often can occur when having a bowel movement or moving around.  It is typically self-limited and usually will stop after a few minutes.  You may use some Vaseline or Neosporin around the tip of the catheter to reduce friction at the tip of the penis. You may also see some blood in the urine.  A very small amount of blood can make the urine look quite red.  As long as the catheter is  draining well, there usually is not a problem.  However, if the catheter is not draining well and is bloody, you should call the office [phone] to notify us.

## 2019-01-31 NOTE — Interval H&P Note (Signed)
History and Physical Interval Note:  01/31/2019 12:11 PM  Jorge [name]  has presented today for surgery, with the diagnosis of LEFT HYDRONEPHROSIS.  The various methods of treatment have been discussed with the patient and family. After consideration of risks, benefits and other options for treatment, the patient has consented to  Procedure(s) with comments: CYSTOSCOPY WITH STENT REPLACEMENT (Left) - 30 MINS as a surgical intervention.  The patient's history has been reviewed, patient examined, no change in status, stable for surgery.  I have reviewed the patient's chart and labs.  Questions were answered to the patient's satisfaction.     Lillette Boxer Lindsay Soulliere

## 2019-01-31 NOTE — Anesthesia Procedure Notes (Signed)
Procedure Name: LMA Insertion Date/Time: 01/31/2019 12:40 PM Performed by: Suan Halter, CRNA Pre-anesthesia Checklist: Patient identified, Emergency Drugs available, Suction available and Patient being monitored Patient Re-evaluated:Patient Re-evaluated prior to induction Oxygen Delivery Method: Circle system utilized Preoxygenation: Pre-oxygenation with 100% oxygen Induction Type: IV induction Ventilation: Mask ventilation without difficulty LMA: LMA inserted LMA Size: 4.0 Number of attempts: 1 Airway Equipment and Method: Bite block Placement Confirmation: positive ETCO2 Tube secured with: Tape Dental Injury: Teeth and Oropharynx as per pre-operative assessment

## 2019-01-31 NOTE — Anesthesia Postprocedure Evaluation (Signed)
Anesthesia Post Note  Patient: Jorge [name]  Procedure(s) Performed: CYSTOSCOPY WITH STENT REPLACEMENT (Left Ureter)     Patient location during evaluation: PACU Anesthesia Type: General Level of consciousness: awake Pain management: pain level controlled Vital Signs Assessment: post-procedure vital signs reviewed and stable Respiratory status: spontaneous breathing Cardiovascular status: stable Postop Assessment: no apparent nausea or vomiting Anesthetic complications: no    Last Vitals:  Vitals:   01/31/19 1444 01/31/19 1445  BP: (!) 171/89 (!) 177/81  Pulse: 70   Resp: 15   Temp:    SpO2: 95%     Last Pain:  Vitals:   01/31/19 1444  TempSrc:   PainSc: 0-No pain                 Aztlan Coll

## 2019-02-04 ENCOUNTER — Encounter (HOSPITAL_BASED_OUTPATIENT_CLINIC_OR_DEPARTMENT_OTHER): Payer: Self-pay | Admitting: Urology

## 2019-02-04 ENCOUNTER — Other Ambulatory Visit: Payer: Self-pay | Admitting: Internal Medicine

## 2019-02-11 ENCOUNTER — Other Ambulatory Visit: Payer: Self-pay | Admitting: Oncology

## 2019-02-11 ENCOUNTER — Other Ambulatory Visit: Payer: Self-pay | Admitting: *Deleted

## 2019-02-11 DIAGNOSIS — C61 Malignant neoplasm of prostate: Secondary | ICD-10-CM

## 2019-02-11 MED ORDER — PREDNISONE 5 MG PO TABS: 5.0000 mg | ORAL_TABLET | Freq: Every day | ORAL | 3 refills | Status: DC

## 2019-02-26 ENCOUNTER — Telehealth: Payer: Self-pay | Admitting: Oncology

## 2019-02-26 NOTE — Telephone Encounter (Signed)
Call day 12/15 moved lab/fu to 12/22. Confirmed with patient.

## 2019-03-19 ENCOUNTER — Ambulatory Visit: Payer: Medicare Other | Admitting: Oncology

## 2019-03-19 ENCOUNTER — Other Ambulatory Visit: Payer: Medicare Other

## 2019-03-26 ENCOUNTER — Inpatient Hospital Stay: Payer: Medicare Other | Admitting: Oncology

## 2019-03-26 ENCOUNTER — Inpatient Hospital Stay: Payer: Medicare Other | Attending: Oncology

## 2019-03-26 ENCOUNTER — Telehealth: Payer: Self-pay | Admitting: *Deleted

## 2019-03-26 ENCOUNTER — Other Ambulatory Visit: Payer: Self-pay

## 2019-03-26 ENCOUNTER — Telehealth: Payer: Self-pay | Admitting: Oncology

## 2019-03-26 DIAGNOSIS — C61 Malignant neoplasm of prostate: Secondary | ICD-10-CM | POA: Diagnosis not present

## 2019-03-26 LAB — CBC WITH DIFFERENTIAL (CANCER CENTER ONLY)
Abs Immature Granulocytes: 0.04 10*3/uL (ref 0.00–0.07)
Basophils Absolute: 0 10*3/uL (ref 0.0–0.1)
Basophils Relative: 0 %
Eosinophils Absolute: 0 10*3/uL (ref 0.0–0.5)
Eosinophils Relative: 0 %
HCT: 40.5 % (ref 39.0–52.0)
Hemoglobin: 13.3 g/dL (ref 13.0–17.0)
Immature Granulocytes: 1 %
Lymphocytes Relative: 11 %
Lymphs Abs: 0.9 10*3/uL (ref 0.7–4.0)
MCH: 32.3 pg (ref 26.0–34.0)
MCHC: 32.8 g/dL (ref 30.0–36.0)
MCV: 98.3 fL (ref 80.0–100.0)
Monocytes Absolute: 0.7 10*3/uL (ref 0.1–1.0)
Monocytes Relative: 9 %
Neutro Abs: 6.3 10*3/uL (ref 1.7–7.7)
Neutrophils Relative %: 79 %
Platelet Count: 288 10*3/uL (ref 150–400)
RBC: 4.12 MIL/uL — ABNORMAL LOW (ref 4.22–5.81)
RDW: 13 % (ref 11.5–15.5)
WBC Count: 8 10*3/uL (ref 4.0–10.5)
nRBC: 0 % (ref 0.0–0.2)

## 2019-03-26 LAB — CMP (CANCER CENTER ONLY)
ALT: 9 U/L (ref 0–44)
AST: 18 U/L (ref 15–41)
Albumin: 3.1 g/dL — ABNORMAL LOW (ref 3.5–5.0)
Alkaline Phosphatase: 341 U/L — ABNORMAL HIGH (ref 38–126)
Anion gap: 10 (ref 5–15)
BUN: 17 mg/dL (ref 8–23)
CO2: 25 mmol/L (ref 22–32)
Calcium: 9 mg/dL (ref 8.9–10.3)
Chloride: 109 mmol/L (ref 98–111)
Creatinine: 1.03 mg/dL (ref 0.61–1.24)
GFR, Est AFR Am: >60 mL/min (ref >60)
GFR, Estimated: >60 mL/min (ref >60)
Glucose, Bld: 107 mg/dL — ABNORMAL HIGH (ref 70–99)
Potassium: 3.9 mmol/L (ref 3.5–5.1)
Sodium: 144 mmol/L (ref 135–145)
Total Bilirubin: 0.4 mg/dL (ref 0.3–1.2)
Total Protein: 6.9 g/dL (ref 6.5–8.1)

## 2019-03-26 NOTE — Telephone Encounter (Signed)
Left message for patient to verify telephone visit for pre reg °

## 2019-03-26 NOTE — Telephone Encounter (Signed)
Per MD request, called patient to suggest he keep lab appointment today and to change OV to telephone visit tomorrow at 11:00. He was very agreeable to this plan.

## 2019-03-27 ENCOUNTER — Inpatient Hospital Stay (HOSPITAL_BASED_OUTPATIENT_CLINIC_OR_DEPARTMENT_OTHER): Payer: Medicare Other | Admitting: Oncology

## 2019-03-27 DIAGNOSIS — I10 Essential (primary) hypertension: Secondary | ICD-10-CM

## 2019-03-27 DIAGNOSIS — C61 Malignant neoplasm of prostate: Secondary | ICD-10-CM

## 2019-03-27 DIAGNOSIS — R9721 Rising PSA following treatment for malignant neoplasm of prostate: Secondary | ICD-10-CM

## 2019-03-27 LAB — PROSTATE-SPECIFIC AG, SERUM (LABCORP): Prostate Specific Ag, Serum: 95.4 ng/mL — ABNORMAL HIGH (ref 0.0–4.0)

## 2019-03-27 NOTE — Progress Notes (Signed)
[facility] OFFICE VISIT PROGRESS NOTE  I connected with Camila Li on 03/27/19 at 9:45 AM EST by telephone and verified that I am speaking with the correct person using two identifiers.   I discussed the limitations, risks, security and privacy concerns of performing an evaluation and management service by telemedicine and the availability of in-person appointments. I also discussed with the patient that there may be a patient responsible charge related to this service. The patient expressed understanding and agreed to proceed  Patient's location: Home Provider's location: Office   Diagnosis: Prostate cancer  INTERVAL HISTORY:   Mr. Sartini is seen today for a telehealth visit.  This is secondary to the Covid pandemic.  He continues abiraterone/prednisone and Lupron.  He has intermittent discomfort in the left hip, but no consistent pain.  He has noted a decrease in his appetite, but his weight is stable. He underwent exchange of the left ureter stent on 01/31/2019.   Lab Results:  Lab Results  Component Value Date   WBC 8.0 03/26/2019   HGB 13.3 03/26/2019   HCT 40.5 03/26/2019   MCV 98.3 03/26/2019   PLT 288 03/26/2019   NEUTROABS 6.3 03/26/2019  PSA-95.4  Medications: I have reviewed the patient's current medications.  Assessment/Plan:  1. Prostate cancer, Gleason 7, status post a prostatectomy in 2008  Elevated PSA 2011, status post external beam radiation and androgen deprivation therapy, radiation completed March 2011, last injection July 2011  Elevated PSA October 2016, CT with periaortic and obturator lymphadenopathy  Androgen deprivation therapy resumed and continues, now on every 79-month Lupron  Bone scan 04/18/2017-new foci of metastatic disease at the right supra-acetabulum, left acetabulum, low cervical spine, and L2  CT 04/18/2017-left iliac, periaortic, and retrocrural lymphadenopathy  Bone scan  12/05/2017- progressive bone metastases; new left hydronephrosis and proximal hydroureter.  CT abdomen/pelvis 12/30/2017-moderate left hydronephrosis with moderate dilatation of the left renal pelvis and proximal left ureter without obstructing calculus. Stable large hiatal hernia. Stable sclerotic lesions in the pelvis and L3 vertebral body concerning for metastatic disease. No enlarged abdominal or pelvic lymph nodes.  Abiraterone/prednisone started 02/07/2018  PSA improved 02/26/2018, higher on 04/06/2018, improved 05/21/2018, higher 03/26/2019  2.Hypertension  3.Recurrent hiatal hernia-status post gastrostomy tube placement 09/20/2017  4.Left hydronephrosis status post placement of double-J stent 01/04/2018.    Disposition: Mr. Shytle has metastatic prostate cancer.  The PSA is higher, but remains lower compared to prior to beginning abiraterone/prednisone.  He appears asymptomatic from the prostate cancer.  He will continue the current treatment.  He would like to wait on a restaging bone scan until after the Covid pandemic has improved.  He continues every 43-month Lupron therapy with Dr. Diona Fanti. Mr. Boedigheimer will return for an office visit and PSA in 4 months.  He will contact us in the interim for new symptoms.   I discussed the assessment and treatment plan with the patient. The patient was provided an opportunity to ask questions and all were answered. The patient agreed with the plan and demonstrated an understanding of the instructions.   The patient was advised to call back or seek an in-person evaluation if the symptoms worsen or if the condition fails to improve as anticipated.  I provided 20 minutes of telephone, chart review, and documentation time during this encounter, and > 50% was spent counseling as documented under my assessment & plan.  Betsy Coder ANP/GNP-BC   03/27/2019 8:49 AM

## 2019-03-28 ENCOUNTER — Telehealth: Payer: Self-pay | Admitting: Oncology

## 2019-03-28 NOTE — Telephone Encounter (Signed)
I alk with patient about schedule

## 2019-04-02 DIAGNOSIS — Z5111 Encounter for antineoplastic chemotherapy: Secondary | ICD-10-CM | POA: Diagnosis not present

## 2019-04-02 DIAGNOSIS — C61 Malignant neoplasm of prostate: Secondary | ICD-10-CM | POA: Diagnosis not present

## 2019-04-22 ENCOUNTER — Telehealth: Payer: Self-pay | Admitting: *Deleted

## 2019-04-22 NOTE — Telephone Encounter (Signed)
Called to inquire of refill on Zytiga? Informed him it was refilled on 04/19/19. Provided phone # to patient to call on the status of his refill.

## 2019-04-23 ENCOUNTER — Encounter: Payer: Self-pay | Admitting: Internal Medicine

## 2019-04-23 ENCOUNTER — Ambulatory Visit (INDEPENDENT_AMBULATORY_CARE_PROVIDER_SITE_OTHER): Payer: Medicare Other | Admitting: Internal Medicine

## 2019-04-23 ENCOUNTER — Other Ambulatory Visit: Payer: Self-pay

## 2019-04-23 VITALS — BP 170/82 | HR 88 | Temp 97.9°F | Ht 72.5 in | Wt 186.0 lb

## 2019-04-23 DIAGNOSIS — I10 Essential (primary) hypertension: Secondary | ICD-10-CM | POA: Diagnosis not present

## 2019-04-23 DIAGNOSIS — L989 Disorder of the skin and subcutaneous tissue, unspecified: Secondary | ICD-10-CM | POA: Diagnosis not present

## 2019-04-23 DIAGNOSIS — R232 Flushing: Secondary | ICD-10-CM

## 2019-04-23 DIAGNOSIS — E559 Vitamin D deficiency, unspecified: Secondary | ICD-10-CM

## 2019-04-23 DIAGNOSIS — C61 Malignant neoplasm of prostate: Secondary | ICD-10-CM

## 2019-04-23 DIAGNOSIS — T50905A Adverse effect of unspecified drugs, medicaments and biological substances, initial encounter: Secondary | ICD-10-CM

## 2019-04-23 NOTE — Progress Notes (Signed)
Subjective:  Patient ID: Jorge [name], male    DOB: 1929/07/17  Age: 86 y.o. MRN: [id number]  CC: No chief complaint on file.   HPI MISHON QUANDER presents for a nose lesion     Outpatient Medications Prior to Visit  Medication Sig Dispense Refill  . amLODipine (NORVASC) 5 MG tablet Take 1 tablet (5 mg total) by mouth daily. 90 tablet 3  . diphenhydramine-acetaminophen (TYLENOL PM) 25-500 MG TABS tablet Take 1 tablet by mouth at bedtime as needed.    [name] Leuprolide Acetate, 3 Month, (LUPRON DEPOT-PED, 66-MONTH, IM) Inject into the muscle every 3 (three) months.    [name] losartan (COZAAR) 100 MG tablet Take 1 tablet by mouth once daily 90 tablet 1  . polyethylene glycol powder (GLYCOLAX/MIRALAX) powder Take 17 g by mouth 2 (two) times daily as needed for mild constipation or moderate constipation. 500 g 3  . predniSONE (DELTASONE) 5 MG tablet Take 1 tablet (5 mg total) by mouth daily with breakfast. 90 tablet 3  . ZYTIGA 250 MG tablet TAKE 4 TABLETS BY MOUTH ONCE DAILY AS DIRECTED.  TAKE 1 HOUR BEFORE OR 2 HOURS AFTER A MEAL (Patient taking differently: Take 500 mg by mouth 2 (two) times daily. ) 120 tablet 11   No facility-administered medications prior to visit.    ROS: Review of Systems  Constitutional: Negative for appetite change, fatigue and unexpected weight change.  HENT: Negative for congestion, nosebleeds, sneezing, sore throat and trouble swallowing.   Eyes: Negative for itching and visual disturbance.  Respiratory: Negative for cough.   Cardiovascular: Negative for chest pain, palpitations and leg swelling.  Gastrointestinal: Negative for abdominal distention, blood in stool, diarrhea and nausea.  Genitourinary: Negative for frequency and hematuria.  Musculoskeletal: Negative for back pain, gait problem, joint swelling and neck pain.  Skin: Positive for color change. Negative for rash.  Neurological: Negative for dizziness, tremors, speech difficulty and weakness.    Psychiatric/Behavioral: Negative for agitation, dysphoric mood, sleep disturbance and suicidal ideas. The patient is not nervous/anxious.     Objective:  BP (!) 170/82 (BP Location: Left Arm, Patient Position: Sitting, Cuff Size: Large)   Pulse 88   Temp 97.9 F (36.6 C) (Oral)   Ht 6' 0.5" (1.842 m)   Wt 186 lb (84.4 kg)   SpO2 96%   BMI 24.88 kg/m   BP Readings from Last 3 Encounters:  04/23/19 (!) 170/82  01/31/19 (!) 177/81  12/25/18 (!) 162/94    Wt Readings from Last 3 Encounters:  04/23/19 186 lb (84.4 kg)  01/30/19 201 lb 1 oz (91.2 kg)  12/25/18 201 lb (91.2 kg)    Physical Exam Constitutional:      General: He is not in acute distress.    Appearance: He is well-developed.     Comments: NAD  Eyes:     Conjunctiva/sclera: Conjunctivae normal.     Pupils: Pupils are equal, round, and reactive to light.  Neck:     Thyroid: No thyromegaly.     Vascular: No JVD.  Cardiovascular:     Rate and Rhythm: Normal rate and regular rhythm.     Heart sounds: Normal heart sounds. No murmur. No friction rub. No gallop.   Pulmonary:     Effort: Pulmonary effort is normal. No respiratory distress.     Breath sounds: Normal breath sounds. No wheezing or rales.  Chest:     Chest wall: No tenderness.  Abdominal:     General: Bowel sounds  are normal. There is no distension.     Palpations: Abdomen is soft. There is no mass.     Tenderness: There is no abdominal tenderness. There is no guarding or rebound.  Musculoskeletal:        General: No tenderness. Normal range of motion.     Cervical back: Normal range of motion.  Lymphadenopathy:     Cervical: No cervical adenopathy.  Skin:    General: Skin is warm and dry.     Findings: Lesion present. No rash.  Neurological:     Mental Status: He is alert and oriented to person, place, and time.     Cranial Nerves: No cranial nerve deficit.     Motor: No abnormal muscle tone.     Coordination: Coordination normal.     Gait:  Gait abnormal.     Deep Tendon Reflexes: Reflexes are normal and symmetric.  Psychiatric:        Behavior: Behavior normal.        Thought Content: Thought content normal.        Judgment: Judgment normal.   Purple skin nodule on nose tip - see above  Lab Results  Component Value Date   WBC 8.0 03/26/2019   HGB 13.3 03/26/2019   HCT 40.5 03/26/2019   PLT 288 03/26/2019   GLUCOSE 107 (H) 03/26/2019   CHOL 164 10/21/2015   TRIG 110 05/21/2018   HDL 52.20 10/21/2015   LDLCALC 88 10/21/2015   ALT 9 03/26/2019   AST 18 03/26/2019   NA 144 03/26/2019   K 3.9 03/26/2019   CL 109 03/26/2019   CREATININE 1.03 03/26/2019   BUN 17 03/26/2019   CO2 25 03/26/2019   TSH 1.26 10/21/2015   PSA 0.46 01/10/2007   INR 0.9 04/09/2018   HGBA1C 6.1 (H) 01/10/2007    No results found.  Assessment & Plan:     Follow-up: No follow-ups on file.  [name], MD

## 2019-04-23 NOTE — Assessment & Plan Note (Signed)
On Vit D 

## 2019-04-23 NOTE — Assessment & Plan Note (Addendum)
BP OK at home Losartan, amlodipine

## 2019-04-23 NOTE — Assessment & Plan Note (Signed)
Skin cancer vs other ENT ref

## 2019-04-23 NOTE — Assessment & Plan Note (Signed)
Zytiga, Lupron, Prednisone

## 2019-04-23 NOTE — Assessment & Plan Note (Signed)
No change 

## 2019-04-25 ENCOUNTER — Telehealth: Payer: Self-pay | Admitting: *Deleted

## 2019-04-25 MED ORDER — ABIRATERONE ACETATE 250 MG PO TABS: ORAL_TABLET | ORAL | 2 refills | Status: DC

## 2019-04-25 NOTE — Telephone Encounter (Signed)
Called to report almost out of his Zytiga. Pharmacy told him we needed to contact Wynetta Emery and [facility]. Refill sent to [facility] as per the past. Message to oral chemo tech to follow up.

## 2019-04-26 ENCOUNTER — Telehealth: Payer: Self-pay | Admitting: *Deleted

## 2019-04-26 NOTE — Telephone Encounter (Signed)
Called again regarding his Jorge [name] (will be out in 4 days). He called pharmacy and was told his assistance with [facility] had expired. This RN called [facility] as well and was told the same by staff, Angelica. She agreed to call Wynetta Emery and [facility] while nurse holds on phone. Was informed as our records showed that his PCP is valid through 01/2020. The will fill script and call patient. Notified patient to expect call from pharmacy soon.

## 2019-05-07 ENCOUNTER — Telehealth: Payer: Self-pay | Admitting: *Deleted

## 2019-05-07 NOTE — Telephone Encounter (Signed)
Left hip pain he had on 03/26/20 rated 2-3 has now increased to 10/10. Pain is constant hard pain and can radiate down his left leg. Keeps him up at night. Requesting an appointment as soon as possible.

## 2019-05-09 ENCOUNTER — Other Ambulatory Visit: Payer: Self-pay

## 2019-05-09 ENCOUNTER — Inpatient Hospital Stay: Payer: Medicare Other | Attending: Oncology | Admitting: Nurse Practitioner

## 2019-05-09 ENCOUNTER — Telehealth: Payer: Self-pay

## 2019-05-09 ENCOUNTER — Encounter: Payer: Self-pay | Admitting: Nurse Practitioner

## 2019-05-09 ENCOUNTER — Inpatient Hospital Stay: Payer: Medicare Other

## 2019-05-09 VITALS — BP 136/77 | HR 97 | Temp 98.2°F | Resp 18 | Ht 72.5 in | Wt 182.1 lb

## 2019-05-09 DIAGNOSIS — C61 Malignant neoplasm of prostate: Secondary | ICD-10-CM

## 2019-05-09 DIAGNOSIS — Z923 Personal history of irradiation: Secondary | ICD-10-CM | POA: Diagnosis not present

## 2019-05-09 DIAGNOSIS — C7951 Secondary malignant neoplasm of bone: Secondary | ICD-10-CM | POA: Diagnosis not present

## 2019-05-09 DIAGNOSIS — R9721 Rising PSA following treatment for malignant neoplasm of prostate: Secondary | ICD-10-CM | POA: Diagnosis not present

## 2019-05-09 DIAGNOSIS — R634 Abnormal weight loss: Secondary | ICD-10-CM | POA: Diagnosis not present

## 2019-05-09 DIAGNOSIS — R531 Weakness: Secondary | ICD-10-CM | POA: Diagnosis not present

## 2019-05-09 DIAGNOSIS — I10 Essential (primary) hypertension: Secondary | ICD-10-CM | POA: Insufficient documentation

## 2019-05-09 DIAGNOSIS — M25552 Pain in left hip: Secondary | ICD-10-CM | POA: Insufficient documentation

## 2019-05-09 LAB — CBC WITH DIFFERENTIAL (CANCER CENTER ONLY)
Abs Immature Granulocytes: 0.11 10*3/uL — ABNORMAL HIGH (ref 0.00–0.07)
Basophils Absolute: 0 10*3/uL (ref 0.0–0.1)
Basophils Relative: 0 %
Eosinophils Absolute: 0 10*3/uL (ref 0.0–0.5)
Eosinophils Relative: 0 %
HCT: 40.2 % (ref 39.0–52.0)
Hemoglobin: 13.1 g/dL (ref 13.0–17.0)
Immature Granulocytes: 1 %
Lymphocytes Relative: 11 %
Lymphs Abs: 1 10*3/uL (ref 0.7–4.0)
MCH: 31.7 pg (ref 26.0–34.0)
MCHC: 32.6 g/dL (ref 30.0–36.0)
MCV: 97.3 fL (ref 80.0–100.0)
Monocytes Absolute: 1.1 10*3/uL — ABNORMAL HIGH (ref 0.1–1.0)
Monocytes Relative: 11 %
Neutro Abs: 7 10*3/uL (ref 1.7–7.7)
Neutrophils Relative %: 77 %
Platelet Count: 387 10*3/uL (ref 150–400)
RBC: 4.13 MIL/uL — ABNORMAL LOW (ref 4.22–5.81)
RDW: 13.4 % (ref 11.5–15.5)
WBC Count: 9.2 10*3/uL (ref 4.0–10.5)
nRBC: 0 % (ref 0.0–0.2)

## 2019-05-09 LAB — CMP (CANCER CENTER ONLY)
ALT: 14 U/L (ref 0–44)
AST: 26 U/L (ref 15–41)
Albumin: 3 g/dL — ABNORMAL LOW (ref 3.5–5.0)
Alkaline Phosphatase: 331 U/L — ABNORMAL HIGH (ref 38–126)
Anion gap: 11 (ref 5–15)
BUN: 23 mg/dL (ref 8–23)
CO2: 22 mmol/L (ref 22–32)
Calcium: 9 mg/dL (ref 8.9–10.3)
Chloride: 104 mmol/L (ref 98–111)
Creatinine: 1.36 mg/dL — ABNORMAL HIGH (ref 0.61–1.24)
GFR, Est AFR Am: 54 mL/min — ABNORMAL LOW (ref >60)
GFR, Estimated: 46 mL/min — ABNORMAL LOW (ref >60)
Glucose, Bld: 117 mg/dL — ABNORMAL HIGH (ref 70–99)
Potassium: 3.3 mmol/L — ABNORMAL LOW (ref 3.5–5.1)
Sodium: 137 mmol/L (ref 135–145)
Total Bilirubin: 0.9 mg/dL (ref 0.3–1.2)
Total Protein: 6.9 g/dL (ref 6.5–8.1)

## 2019-05-09 MED ORDER — TRAMADOL HCL 50 MG PO TABS: 50.0000 mg | ORAL_TABLET | Freq: Two times a day (BID) | ORAL | 0 refills | Status: DC | PRN

## 2019-05-09 NOTE — Telephone Encounter (Signed)
Per Ned Card NP called [phone] and scheduled patient for CT with ORAL CONTRAST ONLY on Tuesday 05/14/19 @ 7:30am, patient is to arrive by 7:15a, be NPO at midnight and to drink first contrast bottle at 5:30am and second contrast bottle at 6:30am. Patient given written instructions about visit date, time, location, and instructions and verbalized understanding. Also sent schedule message to get patient scheduled for doctors visit with Lattie Haw on Tuesday 05/14/19 @ 11:30am.

## 2019-05-09 NOTE — Progress Notes (Addendum)
  [facility] OFFICE PROGRESS NOTE   Diagnosis: Prostate cancer  INTERVAL HISTORY:   Mr. Klunk returns prior to scheduled follow-up for evaluation of left hip/leg pain.  He continues abiraterone/prednisone.  He receives Lupron every 3 months at the urology center.  He reports pain at the left hip/groin radiating to the left leg and occasionally to the left buttock worse over the past 3 weeks.  He often feels that he will fall.  The left leg feels weak.  No numbness.  No bowel or bladder dysfunction.  He denies back pain.  He takes Tylenol with incomplete relief.  He denies significant pain with ambulation.  No fever, cough, shortness of breath.  Appetite is poor.  He is losing weight.  Objective:  Vital signs in last 24 hours:  Blood pressure 136/77, pulse 97, temperature 98.2 F (36.8 C), temperature source Temporal, resp. rate 18, height 6' 0.5" (1.842 m), weight 182 lb 1.6 oz (82.6 kg), SpO2 100 %.    GI: Abdomen soft and nontender.  No hepatosplenomegaly. Vascular: No leg edema. Neuro: Proximal left leg weakness. Musculoskeletal: Nontender over the left hip. Skin: No rash.   Lab Results:  Lab Results  Component Value Date   WBC 8.0 03/26/2019   HGB 13.3 03/26/2019   HCT 40.5 03/26/2019   MCV 98.3 03/26/2019   PLT 288 03/26/2019   NEUTROABS 6.3 03/26/2019    Imaging:  No results found.  Medications: I have reviewed the patient's current medications.  Assessment/Plan: 1. Prostate cancer, Gleason 7, status post a prostatectomy in 2008  Elevated PSA 2011, status post external beam radiation and androgen deprivation therapy, radiation completed March 2011, last injection July 2011  Elevated PSA October 2016, CT with periaortic and obturator lymphadenopathy  Androgen deprivation therapy resumed and continues, now on every 78-month Lupron  Bone scan 04/18/2017-new foci of metastatic disease at the right supra-acetabulum, left acetabulum, low  cervical spine, and L2  CT 04/18/2017-left iliac, periaortic, and retrocrural lymphadenopathy  Bone scan 12/05/2017- progressive bone metastases; new left hydronephrosis and proximal hydroureter.  CT abdomen/pelvis 12/30/2017-moderate left hydronephrosis with moderate dilatation of the left renal pelvis and proximal left ureter without obstructing calculus. Stable large hiatal hernia. Stable sclerotic lesions in the pelvis and L3 vertebral body concerning for metastatic disease. No enlarged abdominal or pelvic lymph nodes.  Abiraterone/prednisone started 02/07/2018  PSA improved 02/26/2018, higher on 04/06/2018, improved 05/21/2018, higher 03/26/2019  2.Hypertension  3.Recurrent hiatal hernia-status post gastrostomy tube placement 09/20/2017  4.Left hydronephrosis status post placement of double-J stent 01/04/2018.  Disposition: Mr. Dacres has metastatic prostate cancer.  He presents today with worsening pain at the left hip/groin.  He has left proximal leg weakness on exam.  He is experiencing anorexia/weight loss.  We are referring him for CTs abdomen/pelvis.  He will return to the lab today for a CBC, chemistry panel and PSA.  We are sending a prescription for tramadol to his pharmacy.  He will return for a follow-up visit on 05/14/2019.  Patient seen with Dr. Benay Spice.    Ned Card ANP/GNP-BC   05/09/2019  10:06 AM   This was a shared visit with Ned Card. Mr. Behrman was interviewed and examined. He has increased pain in the left pelvic area. He will be referred for a CT evaluation.  Julieanne Manson, MD

## 2019-05-10 ENCOUNTER — Other Ambulatory Visit: Payer: Self-pay | Admitting: Nurse Practitioner

## 2019-05-10 ENCOUNTER — Telehealth: Payer: Self-pay | Admitting: *Deleted

## 2019-05-10 DIAGNOSIS — C61 Malignant neoplasm of prostate: Secondary | ICD-10-CM

## 2019-05-10 LAB — PROSTATE-SPECIFIC AG, SERUM (LABCORP): Prostate Specific Ag, Serum: 130 ng/mL — ABNORMAL HIGH (ref 0.0–4.0)

## 2019-05-10 MED ORDER — POTASSIUM CHLORIDE CRYS ER 20 MEQ PO TBCR: 20.0000 meq | EXTENDED_RELEASE_TABLET | Freq: Every day | ORAL | 0 refills | Status: DC

## 2019-05-10 NOTE — Telephone Encounter (Signed)
-----   Message from Owens Shark, NP sent at 05/10/2019  9:57 AM EST ----- Please let him know he had mild hypokalemia on labs yesterday.  I am sending a prescription for K-Dur 20 mEq daily to his pharmacy.  We will repeat labs when he returns 05/14/2019.

## 2019-05-10 NOTE — Telephone Encounter (Signed)
Per Ned Card NP, called to make pt aware that he has mild hypokalemia from labs on yesterday and a prescription of K-Dur 20 mEq daily was sent to his preferred pharmacy and labs will be repeated on 05/14/19. Pt verbalized understanding

## 2019-05-14 ENCOUNTER — Other Ambulatory Visit: Payer: Self-pay

## 2019-05-14 ENCOUNTER — Encounter: Payer: Self-pay | Admitting: Nurse Practitioner

## 2019-05-14 ENCOUNTER — Ambulatory Visit (HOSPITAL_COMMUNITY)
Admission: RE | Admit: 2019-05-14 | Discharge: 2019-05-14 | Disposition: A | Discharge status: Home / Self Care | Payer: Medicare Other | Source: Ambulatory Visit | Attending: Nurse Practitioner | Admitting: Nurse Practitioner

## 2019-05-14 ENCOUNTER — Inpatient Hospital Stay: Payer: Medicare Other

## 2019-05-14 ENCOUNTER — Inpatient Hospital Stay (HOSPITAL_BASED_OUTPATIENT_CLINIC_OR_DEPARTMENT_OTHER): Payer: Medicare Other | Admitting: Nurse Practitioner

## 2019-05-14 ENCOUNTER — Encounter (HOSPITAL_COMMUNITY): Payer: Self-pay

## 2019-05-14 ENCOUNTER — Telehealth: Payer: Self-pay | Admitting: Pharmacist

## 2019-05-14 VITALS — BP 152/85 | HR 89 | Temp 98.3°F | Resp 16 | Ht 72.5 in | Wt 184.8 lb

## 2019-05-14 DIAGNOSIS — C61 Malignant neoplasm of prostate: Secondary | ICD-10-CM | POA: Diagnosis not present

## 2019-05-14 DIAGNOSIS — R531 Weakness: Secondary | ICD-10-CM | POA: Diagnosis not present

## 2019-05-14 DIAGNOSIS — I10 Essential (primary) hypertension: Secondary | ICD-10-CM | POA: Diagnosis not present

## 2019-05-14 DIAGNOSIS — R9721 Rising PSA following treatment for malignant neoplasm of prostate: Secondary | ICD-10-CM | POA: Diagnosis not present

## 2019-05-14 DIAGNOSIS — C7951 Secondary malignant neoplasm of bone: Secondary | ICD-10-CM | POA: Diagnosis not present

## 2019-05-14 DIAGNOSIS — Z923 Personal history of irradiation: Secondary | ICD-10-CM | POA: Diagnosis not present

## 2019-05-14 DIAGNOSIS — M25552 Pain in left hip: Secondary | ICD-10-CM | POA: Diagnosis not present

## 2019-05-14 DIAGNOSIS — R634 Abnormal weight loss: Secondary | ICD-10-CM | POA: Diagnosis not present

## 2019-05-14 LAB — BASIC METABOLIC PANEL - CANCER CENTER ONLY
Anion gap: 9 (ref 5–15)
BUN: 11 mg/dL (ref 8–23)
CO2: 24 mmol/L (ref 22–32)
Calcium: 9 mg/dL (ref 8.9–10.3)
Chloride: 110 mmol/L (ref 98–111)
Creatinine: 0.94 mg/dL (ref 0.61–1.24)
GFR, Est AFR Am: >60 mL/min (ref >60)
GFR, Estimated: >60 mL/min (ref >60)
Glucose, Bld: 104 mg/dL — ABNORMAL HIGH (ref 70–99)
Potassium: 4 mmol/L (ref 3.5–5.1)
Sodium: 143 mmol/L (ref 135–145)

## 2019-05-14 MED ORDER — ENZALUTAMIDE 40 MG PO CAPS: 160.0000 mg | ORAL_CAPSULE | Freq: Every day | ORAL | 0 refills | Status: DC

## 2019-05-14 NOTE — Progress Notes (Addendum)
[place] OFFICE PROGRESS NOTE   Diagnosis: Prostate cancer  INTERVAL HISTORY:   Jorge [name] returns as scheduled.  He is not having pain at present.  He states the pain "comes and goes".  No nausea or vomiting.  No constipation or diarrhea.  Objective:  Vital signs in last 24 hours:  Blood pressure (!) 152/85, pulse 89, temperature 98.3 F (36.8 C), temperature source Temporal, resp. rate 16, height 6' 0.5" (1.842 m), weight 184 lb 12.8 oz (83.8 kg), SpO2 98 %.    HEENT: No thrush or ulcers. GI: Abdomen soft and nontender.  No hepatomegaly. Vascular: No leg edema. Neuro: Alert and oriented.    Lab Results:  Lab Results  Component Value Date   WBC 9.2 05/09/2019   HGB 13.1 05/09/2019   HCT 40.2 05/09/2019   MCV 97.3 05/09/2019   PLT 387 05/09/2019   NEUTROABS 7.0 05/09/2019    Imaging:  CT Abdomen Pelvis Wo Contrast  Result Date: 05/14/2019 CLINICAL DATA:  Prostate cancer. Left hip and groin pain. EXAM: CT ABDOMEN AND PELVIS WITHOUT CONTRAST TECHNIQUE: Multidetector CT imaging of the abdomen and pelvis was performed following the standard protocol without IV contrast. COMPARISON:  12/30/2017 FINDINGS: Lower chest: 7 mm medial subpleural nodule at the right base (image 8/series 2) has decreased from 12 mm previously. There is some patchy infrahilar atelectasis or infiltrate in the right lung base. No pleural effusion. The large hiatal hernia or left hemidiaphragmatic defect is stable. Hepatobiliary: No focal abnormality in the liver on this study without intravenous contrast. There is no evidence for gallstones, gallbladder wall thickening, or pericholecystic fluid. No intrahepatic or extrahepatic biliary dilation. Pancreas: No focal mass lesion. No dilatation of the main duct. No intraparenchymal cyst. No peripancreatic edema. Spleen: No splenomegaly. No focal mass lesion. Adrenals/Urinary Tract: No adrenal nodule or mass. Right kidney and ureter unremarkable.  Left internal ureteral stent identified, new in the interval. Proximal loop is formed in the lower pole collecting system. Distal loop is formed in the bladder lumen. Persistent mild left hydronephrosis evident. Bladder is decompressed. Stomach/Bowel: Contained within the large left sided hernia. Duodenum is normally positioned as is the ligament of Treitz. No small bowel wall thickening. No small bowel dilatation. The terminal ileum is normal. Transverse colon contained within the left hemithoracic hernia. No complicating features. Diverticular changes are noted in the left colon without evidence of diverticulitis. Vascular/Lymphatic: There is abdominal aortic atherosclerosis without aneurysm. There is no gastrohepatic or hepatoduodenal ligament lymphadenopathy. No retroperitoneal or mesenteric lymphadenopathy. 5 mm short axis left external iliac node on 72/2 today has decreased from 14 mm previously. 10 mm short axis left external iliac node on 70/2 was 14 mm previously. Clustered small lymph nodes at the aortic bifurcation seen previously have decreased in the interval. Reproductive: Prostate gland surgically absent. Other: No intraperitoneal free fluid. Musculoskeletal: Small left groin hernia contains only fat. Interval progression of sclerosis in the left superior pubic ramus, acetabulum, and posterior ischial tuberosity sclerotic changes in the roof of the right acetabulum and anterior iliac bones also progressive since prior. 17 mm sclerotic lesion posterior left iliac bone on 66/2 was 5 mm previously. Multiple new sclerotic lesions are identified in the lumbar spine. IMPRESSION: 1. Imaging features suggest interval mixed response to therapy. 2. Interval progression of sclerotic lesions in the bony pelvis and lumbar spine, consistent with progression of bony metastatic disease. 3. Interval decrease in size of the medial right lower lobe pulmonary nodule. 4. Interval decrease  in size of the left external  iliac and retroperitoneal lymph nodes. 5. Left internal ureteral stent with persistent mild left hydronephrosis. 6. Large hiatal hernia or left hemidiaphragmatic defect, stable. 7. Left colonic diverticulosis without diverticulitis. 8. Aortic Atherosclerosis (ICD10-I70.0). Electronically Signed   By: Misty Stanley M.D.   On: 05/14/2019 10:11    Medications: I have reviewed the patient's current medications.  Assessment/Plan: 1.Prostate cancer, Gleason 7, status post a prostatectomy in 2008  Elevated PSA 2011, status post external beam radiation and androgen deprivation therapy, radiation completed March 2011, last injection July 2011  Elevated PSA October 2016, CT with periaortic and obturator lymphadenopathy  Androgen deprivation therapy resumed and continues, now on every 32-month Lupron  Bone scan 04/18/2017-new foci of metastatic disease at the right supra-acetabulum, left acetabulum, low cervical spine, and L2  CT 04/18/2017-left iliac, periaortic, and retrocrural lymphadenopathy  Bone scan 12/05/2017-progressive bone metastases; new left hydronephrosis and proximal hydroureter.  CT abdomen/pelvis 12/30/2017-moderate left hydronephrosis with moderate dilatation of the left renal pelvis and proximal left ureter without obstructing calculus. Stable large hiatal hernia. Stable sclerotic lesions in the pelvis and L3 vertebral body concerning for metastatic disease. No enlarged abdominal or pelvic lymph nodes.  Abiraterone/prednisone started 02/07/2018  PSA improved 02/26/2018, higher on 04/06/2018, improved 05/21/2018, higher 03/26/2019, higher 05/09/2019  CTs 05/14/2019-interval progression of sclerotic lesions in the bony pelvis and lumbar spine consistent with progression of bony metastatic disease.  Decrease in the size of a medial right lower lobe pulmonary nodule.  Decrease in size of left external iliac and retroperitoneal lymph nodes.  Left internal ureteral stent with persistent mild  left hydronephrosis.  Large hiatal hernia or left hemidiaphragmatic defect, stable.  2.Hypertension  3.Recurrent hiatal hernia-status post gastrostomy tube placement 09/20/2017  4.Left hydronephrosis status post placement of double-J stent 01/04/2018.  Disposition: Jorge [name] appears unchanged.  We reviewed the CT results from earlier today.  There is evidence of progression of bony metastatic disease.  Most recent PSA was further elevated.  He continues to have intermittent pain at the left hip/groin but has had no pain over the past several days.  We prescribed tramadol at the time of his last appointment for as needed use.  Dr. Benay Spice recommends discontinuation of Zytiga and initiation of Xtandi.  We reviewed potential side effects associated with Xtandi including hyperglycemia, hypertension, hot flashes.  He agrees to proceed.  We will ask the [facility] pharmacist to discuss further with him.  He will taper off of prednisone, 5 mg every other day for 5 doses and then discontinue.  He will return for lab and follow-up in approximately 3 weeks.  He will contact the office in the interim with any problems.  Patient seen with Dr. Benay Spice.  CT images reviewed on the computer with Mr. Atcitty at today's visit.    Ned Card ANP/GNP-BC   05/14/2019  11:34 AM  This was a shared visit with Ned Card.  We reviewed the CT images with Mr. Batman.  There is evidence of progressive metastatic disease involving the bones.  I suspect his pain is related to disease in the left pelvis.  The PSA is higher and he is losing weight.  He will discontinue abiraterone.  We discussed treatment options.  The plan is to begin a trial of enzalutamide.  We reviewed potential toxicities associated with enzalutamide.  He agrees to proceed. We will discuss Zometa therapy when he returns in 3 weeks. Julieanne Manson, MD

## 2019-05-14 NOTE — Patient Instructions (Addendum)
Take Prednisone 5 mg every other day for 5 doses then STOP; Stop Zytiga    Enzalutamide capsules What is this medicine? ENZALUTAMIDE blocks the effect of the male hormone called testosterone. This medicine is used for certain types of prostate cancer. This medicine may be used for other purposes; ask your health care provider or pharmacist if you have questions. COMMON BRAND NAME(S): XTANDI What should I tell my health care provider before I take this medicine? They need to know if you have any of these conditions:  bone problems  brain tumor  head injury  heart disease  history of stroke  seizures  an unusual or allergic reaction to enzalutamide, other medicines, foods, dyes, or preservatives  pregnant or trying to get pregnant  breast-feeding How should I use this medicine? Take this medicine by mouth with a glass of water. You can take it with or without food. If it upsets your stomach, take it with food. Follow the directions on the prescription label. Do not cut, crush, or chew this medicine. Take your medicine at regular intervals. Do not take it more often than directed. Do not stop taking except on your doctor's advice. Talk to your pediatrician regarding the use of this medicine in children. Special care may be needed. Overdosage: If you think you have taken too much of this medicine contact a poison control center or emergency room at once. NOTE: This medicine is only for you. Do not share this medicine with others. What if I miss a dose? If you miss a dose, take it as soon as you can. If it is almost time for your next dose, take only that dose and skip your missed dose. Do not take double or extra doses. What may interact with this medicine? This medicine may interact with the following medications:  alfentanil  bosentan  certain medications for seizures like carbamazepine, phenobarbital, and  phenytoin  cyclosporine  dihydroergotamine  efavirenz  ergotamine  etravirine  fentanyl  gemfibrozil  midazolam  modafinil  nafcillin  omeprazole  pimozide  quinidine  rifabutin  rifampin  rifapentine  St. John's Wort  sirolimus  tacrolimus  warfarin This list may not describe all possible interactions. Give your health care provider a list of all the medicines, herbs, non-prescription drugs, or dietary supplements you use. Also tell them if you smoke, drink alcohol, or use illegal drugs. Some items may interact with your medicine. What should I watch for while using this medicine? This medicine should not be used in women. Men should not father a child while taking this medicine and for 3 months after stopping it. There is a potential for serious side effects to an unborn child. Talk to your health care professional or pharmacist for more information. Due to a risk of seizures with enzalutamide therapy, use caution when engaging in activities that could result in serious harm to yourself or others if you pass out. What side effects may I notice from receiving this medicine? Side effects that you should report to your doctor or health care professional as soon as possible:  allergic reactions like skin rash, itching or hives, swelling of the face, lips, or tongue  changes in vision  chest pain or tightness  confusion  falls  loss of memory  seizures  sudden numbness or weakness of the face, arm or leg  trouble speaking or understanding  trouble walking, dizziness, loss of balance or coordination Side effects that usually do not require medical attention (report to your  doctor or health care professional if they continue or are bothersome):  blood in the urine  breathing problems  diarrhea  dizziness  headache  joint pain  pain, tingling, numbness in the hands or feet  swelling of the ankles, feet, hands  unusually weak or  tired This list may not describe all possible side effects. Call your doctor for medical advice about side effects. You may report side effects to FDA at 1-800-FDA-1088. Where should I keep my medicine? Keep out of the reach of children. Store between 20 and 25 degrees C (68 and 77 degrees F). Throw away any unused medicine after the expiration date. NOTE: This sheet is a summary. It may not cover all possible information. If you have questions about this medicine, talk to your doctor, pharmacist, or health care provider.  2020 Elsevier/Gold Standard (2018-03-20 16:26:44)

## 2019-05-14 NOTE — Telephone Encounter (Signed)
Oral Oncology Pharmacist Encounter  Received new prescription for Xtandi (enzalutamide) for the treatment of metastatic castrate resistant prostate cancer in conjunction with Lupron, planned duration until disease progression or unacceptable drug toxicity.  Prescription dose and frequency assessed.   Current medication list in Epic reviewed, several DDIs with Gillermina Phy identified: - Xtandi may decrease the serum concentration of amlodipine, losartan, tramadol. Monitor patient for decreased effectiveness of the listed medications. No baseline dose adjustment needed.   Prescription has been e-scribed to the [facility] for benefits analysis and approval.  Oral Oncology Clinic will continue to follow for insurance authorization, copayment issues, initial counseling and start date.  Darl Pikes, PharmD, BCPS, [facility] Hematology/Oncology Clinical Pharmacist [facility]/AP [facility] [phone]  05/14/2019 3:03 PM

## 2019-05-20 ENCOUNTER — Telehealth: Payer: Self-pay

## 2019-05-20 NOTE — Telephone Encounter (Signed)
Oral Oncology Patient Advocate Encounter  Met patient in [place] to complete an application for [employer] in an effort to reduce the patient's out of pocket expense for Xtandi to $0.    Application completed and faxed to [phone].   [place] phone number for follow up is [phone].   This encounter will be updated until final determination.  [place] Patient [place] Phone [phone] Fax [phone] 05/20/2019 9:10 AM

## 2019-05-20 NOTE — Telephone Encounter (Signed)
Patient is approved for Xtandi at $0 through [place] 05/17/19-04/03/20  [place] phone [phone]  [place] Patient [place] Phone [phone] Fax [phone] 05/20/2019 9:12 AM

## 2019-05-27 ENCOUNTER — Encounter: Payer: Self-pay | Admitting: Internal Medicine

## 2019-05-29 ENCOUNTER — Telehealth: Payer: Self-pay

## 2019-05-29 NOTE — Telephone Encounter (Signed)
Patient called to let us know he had not received his Xtandi from manufacturer assistance due to weather delays with [employer]. He has the tracking number and has been tracking package. Patient has an appointment on Tuesday, advised patient to let us know if it had not arrived by then.  Jorge [name] Patient [place] Phone [phone] Fax [phone] 05/29/2019 10:53 AM

## 2019-06-04 ENCOUNTER — Other Ambulatory Visit: Payer: Self-pay

## 2019-06-06 ENCOUNTER — Inpatient Hospital Stay: Payer: Medicare Other

## 2019-06-06 ENCOUNTER — Inpatient Hospital Stay: Payer: Medicare Other | Attending: Oncology | Admitting: Nurse Practitioner

## 2019-06-06 ENCOUNTER — Telehealth: Payer: Self-pay | Admitting: Oncology

## 2019-06-06 ENCOUNTER — Telehealth: Payer: Self-pay | Admitting: Pharmacist

## 2019-06-06 ENCOUNTER — Encounter: Payer: Self-pay | Admitting: Nurse Practitioner

## 2019-06-06 ENCOUNTER — Other Ambulatory Visit: Payer: Self-pay

## 2019-06-06 ENCOUNTER — Telehealth: Payer: Self-pay | Admitting: Nurse Practitioner

## 2019-06-06 VITALS — BP 139/76 | HR 87 | Temp 98.2°F | Resp 17 | Ht 72.5 in | Wt 177.0 lb

## 2019-06-06 DIAGNOSIS — E876 Hypokalemia: Secondary | ICD-10-CM | POA: Insufficient documentation

## 2019-06-06 DIAGNOSIS — I10 Essential (primary) hypertension: Secondary | ICD-10-CM | POA: Diagnosis not present

## 2019-06-06 DIAGNOSIS — C7951 Secondary malignant neoplasm of bone: Secondary | ICD-10-CM | POA: Insufficient documentation

## 2019-06-06 DIAGNOSIS — C61 Malignant neoplasm of prostate: Secondary | ICD-10-CM | POA: Diagnosis not present

## 2019-06-06 DIAGNOSIS — Z923 Personal history of irradiation: Secondary | ICD-10-CM | POA: Insufficient documentation

## 2019-06-06 DIAGNOSIS — R634 Abnormal weight loss: Secondary | ICD-10-CM | POA: Diagnosis not present

## 2019-06-06 LAB — CMP (CANCER CENTER ONLY)
ALT: 7 U/L (ref 0–44)
AST: 20 U/L (ref 15–41)
Albumin: 2.8 g/dL — ABNORMAL LOW (ref 3.5–5.0)
Alkaline Phosphatase: 372 U/L — ABNORMAL HIGH (ref 38–126)
Anion gap: 7 (ref 5–15)
BUN: 11 mg/dL (ref 8–23)
CO2: 26 mmol/L (ref 22–32)
Calcium: 8.6 mg/dL — ABNORMAL LOW (ref 8.9–10.3)
Chloride: 107 mmol/L (ref 98–111)
Creatinine: 0.84 mg/dL (ref 0.61–1.24)
GFR, Est AFR Am: >60 mL/min (ref >60)
GFR, Estimated: >60 mL/min (ref >60)
Glucose, Bld: 117 mg/dL — ABNORMAL HIGH (ref 70–99)
Potassium: 3.3 mmol/L — ABNORMAL LOW (ref 3.5–5.1)
Sodium: 140 mmol/L (ref 135–145)
Total Bilirubin: 0.6 mg/dL (ref 0.3–1.2)
Total Protein: 6.2 g/dL — ABNORMAL LOW (ref 6.5–8.1)

## 2019-06-06 LAB — CBC WITH DIFFERENTIAL (CANCER CENTER ONLY)
Abs Immature Granulocytes: 0.03 10*3/uL (ref 0.00–0.07)
Basophils Absolute: 0 10*3/uL (ref 0.0–0.1)
Basophils Relative: 0 %
Eosinophils Absolute: 0.1 10*3/uL (ref 0.0–0.5)
Eosinophils Relative: 1 %
HCT: 38.5 % — ABNORMAL LOW (ref 39.0–52.0)
Hemoglobin: 12.6 g/dL — ABNORMAL LOW (ref 13.0–17.0)
Immature Granulocytes: 0 %
Lymphocytes Relative: 13 %
Lymphs Abs: 1 10*3/uL (ref 0.7–4.0)
MCH: 31.3 pg (ref 26.0–34.0)
MCHC: 32.7 g/dL (ref 30.0–36.0)
MCV: 95.5 fL (ref 80.0–100.0)
Monocytes Absolute: 0.9 10*3/uL (ref 0.1–1.0)
Monocytes Relative: 11 %
Neutro Abs: 5.9 10*3/uL (ref 1.7–7.7)
Neutrophils Relative %: 75 %
Platelet Count: 246 10*3/uL (ref 150–400)
RBC: 4.03 MIL/uL — ABNORMAL LOW (ref 4.22–5.81)
RDW: 13.6 % (ref 11.5–15.5)
WBC Count: 7.8 10*3/uL (ref 4.0–10.5)
nRBC: 0 % (ref 0.0–0.2)

## 2019-06-06 MED ORDER — POTASSIUM CHLORIDE ER 10 MEQ PO TBCR: 10.0000 meq | EXTENDED_RELEASE_TABLET | Freq: Every day | ORAL | 1 refills | Status: DC

## 2019-06-06 NOTE — Progress Notes (Signed)
  [facility] OFFICE PROGRESS NOTE   Diagnosis: Prostate cancer  INTERVAL HISTORY:   Mr. Grim returns as scheduled.  He began [place] on 06/02/2019.  He misunderstood the dosing instructions and only took one of the 40 mg tablets.  He had nausea/vomiting after the first 2 doses.  With the third dose he ate something prior to taking and tolerated better.  His appetite varies.  He has lost some weight since his last visit.  He denies pain.  Objective:  Vital signs in last 24 hours:  Blood pressure 139/76, pulse 87, temperature 98.2 F (36.8 C), temperature source Temporal, resp. rate 17, height 6' 0.5" (1.842 m), weight 177 lb (80.3 kg), SpO2 97 %.    HEENT: No thrush or ulcers. GI: Abdomen soft and nontender.  No hepatomegaly. Vascular: No leg edema. Neuro: Alert and oriented.    Lab Results:  Lab Results  Component Value Date   WBC 7.8 06/06/2019   HGB 12.6 (L) 06/06/2019   HCT 38.5 (L) 06/06/2019   MCV 95.5 06/06/2019   PLT 246 06/06/2019   NEUTROABS 5.9 06/06/2019    Imaging:  No results found.  Medications: I have reviewed the patient's current medications.  Assessment/Plan: 1.Prostate cancer, Gleason 7, status post a prostatectomy in 2008  Elevated PSA 2011, status post external beam radiation and androgen deprivation therapy, radiation completed March 2011, last injection July 2011  Elevated PSA October 2016, CT with periaortic and obturator lymphadenopathy  Androgen deprivation therapy resumed and continues, now on every 38-month Lupron  Bone scan 04/18/2017-new foci of metastatic disease at the right supra-acetabulum, left acetabulum, low cervical spine, and L2  CT 04/18/2017-left iliac, periaortic, and retrocrural lymphadenopathy  Bone scan 12/05/2017-progressive bone metastases; new left hydronephrosis and proximal hydroureter.  CT abdomen/pelvis 12/30/2017-moderate left hydronephrosis with moderate dilatation of the left renal pelvis and  proximal left ureter without obstructing calculus. Stable large hiatal hernia. Stable sclerotic lesions in the pelvis and L3 vertebral body concerning for metastatic disease. No enlarged abdominal or pelvic lymph nodes.  Abiraterone/prednisone started 02/07/2018  PSA improved 02/26/2018, higher on 04/06/2018, improved 05/21/2018, higher 03/26/2019, higher 05/09/2019  CTs 05/14/2019-interval progression of sclerotic lesions in the bony pelvis and lumbar spine consistent with progression of bony metastatic disease.  Decrease in the size of a medial right lower lobe pulmonary nodule.  Decrease in size of left external iliac and retroperitoneal lymph nodes.  Left internal ureteral stent with persistent mild left hydronephrosis.  Large hiatal hernia or left hemidiaphragmatic defect, stable.  Xtandi 06/02/2019  2.Hypertension  3.Recurrent hiatal hernia-status post gastrostomy tube placement 09/20/2017  4.Left hydronephrosis status post placement of double-J stent 01/04/2018.   Disposition: Mr. Simonetti appears stable.  He recently began [place].  We reviewed the dosing instructions.  The [place] pharmacist will also contact him to discuss further.  He understands he can take the pills after eating for improved tolerance.  He continues Lupron at the urology center.  We reviewed the CBC and chemistry panel from today.  He has mild hypokalemia.  He will begin K-Dur 10 mEq daily.  We made a referral to the [place] dietitian due to his intermittent poor appetite and weight loss.  He will return for lab and follow-up in 3 weeks.  He will contact the office in the interim with any problems.    Ned Card ANP/GNP-BC   06/06/2019  12:15 PM

## 2019-06-06 NOTE — Telephone Encounter (Signed)
Scheduled per los. Gave avs and calendar  

## 2019-06-06 NOTE — Telephone Encounter (Signed)
Scheduled 3/8 phone visit per 3/4 sch msg. Pt is aware of appt date and time.

## 2019-06-06 NOTE — Telephone Encounter (Signed)
Oral Chemotherapy Pharmacist Encounter   Attempted to call Mr. Baria for [facility] patient education. No answer, LVM for patient to call back.  Darl Pikes, PharmD, BCPS, [facility] Hematology/Oncology Clinical Pharmacist [facility]/AP Oral [facility] [phone]  06/06/2019 1:55 PM

## 2019-06-07 LAB — PROSTATE-SPECIFIC AG, SERUM (LABCORP): Prostate Specific Ag, Serum: 60.1 ng/mL — ABNORMAL HIGH (ref 0.0–4.0)

## 2019-06-10 ENCOUNTER — Inpatient Hospital Stay: Payer: Medicare Other | Admitting: Nutrition

## 2019-06-10 ENCOUNTER — Telehealth: Payer: Self-pay

## 2019-06-10 NOTE — Progress Notes (Signed)
86 year old male diagnosed with prostate cancer.  He is a patient of Dr. Julieanne Manson.  Past medical history includes vitamin D deficiency, esophageal strictures, hypertension, GERD, ulcers, anxiety.  Medications include MiraLAX.  Labs include potassium 3.3, glucose 117, and albumin 2.8.  Height: 6 feet 1/2 inch. Weight: 177 pounds on March 4. Usual body weight: 200 pounds per patient. BMI: 23.68.  Patient endorses 25 pound weight loss.  This is approximately 11% over 5 months which is significant. Patient reports he had a very poor appetite about 3 weeks ago but it is getting better. He reports he has good neighbors who are providing him with some meals. He is able to fix small amounts of prepared foods that he enjoys. Verbalizes desire to minimize weight loss/regain some weight. Admits to weakness.  Nutrition diagnosis: Unintended weight loss related to inadequate oral intake as evidenced by 11% weight loss over 5 months.  Intervention: Educated patient to consume high-calorie, high-protein foods every several hours throughout the day. Reviewed some high-calorie, high-protein snack choices. Educated patient to consume boost plus between meals. I will mail fact sheets and coupons along with my contact information. Encouraged patient to call me with questions or concerns. Patient very appreciative of information.  Monitoring, evaluation, goals: Patient will tolerate increased calories and protein to minimize weight loss and improve quality of life.  Next visit: Patient will contact me for questions or concerns.  **Disclaimer: This note was dictated with voice recognition software. Similar sounding words can inadvertently be transcribed and this note may contain transcription errors which may not have been corrected upon publication of note.**

## 2019-06-10 NOTE — Telephone Encounter (Signed)
TC to pt per Ned Card NP to instruct him to hold Xtandi due to poor tolerance. Also let him know that he should follow-up as scheduled in 2 weeks. Patient verbalized understanding. No further problems or concerns at this time.

## 2019-06-10 NOTE — Telephone Encounter (Signed)
Oral Chemotherapy Pharmacist Encounter   I was able to reach Jorge [name] this afternoon. He started his Xtandi last week and I asked him how he was doing, he said "not well". He reported taking only 1-2 capsules of Xtandi the immediately dry heaving or vomiting. He knows the prescription is written for him to take 4 capsule but the most he has been able to take is 2 capsules. I asked if he was takeing the medication with out without food and he reported trying it both way but immediately dry heaving or vomiting either way. He has not taking his medication today due to this reaction. He reports no issue with actually swallowing the Xtandi capsules.  Called Ned Card, NP to let her know the above information. She is going to reach out to [name].  Darl Pikes, PharmD, BCPS, BCOP, CPP Hematology/Oncology Clinical Pharmacist [facility] Oral [facility] [phone]  06/10/2019 3:42 PM

## 2019-06-28 ENCOUNTER — Inpatient Hospital Stay: Payer: Medicare Other | Admitting: Oncology

## 2019-06-28 ENCOUNTER — Inpatient Hospital Stay: Payer: Medicare Other

## 2019-06-28 ENCOUNTER — Other Ambulatory Visit: Payer: Self-pay

## 2019-06-28 VITALS — BP 132/76 | HR 84 | Temp 98.3°F | Resp 16 | Ht 72.5 in | Wt 174.0 lb

## 2019-06-28 DIAGNOSIS — E876 Hypokalemia: Secondary | ICD-10-CM | POA: Diagnosis not present

## 2019-06-28 DIAGNOSIS — C61 Malignant neoplasm of prostate: Secondary | ICD-10-CM

## 2019-06-28 DIAGNOSIS — R634 Abnormal weight loss: Secondary | ICD-10-CM | POA: Diagnosis not present

## 2019-06-28 DIAGNOSIS — C7951 Secondary malignant neoplasm of bone: Secondary | ICD-10-CM | POA: Diagnosis not present

## 2019-06-28 DIAGNOSIS — Z923 Personal history of irradiation: Secondary | ICD-10-CM | POA: Diagnosis not present

## 2019-06-28 DIAGNOSIS — I10 Essential (primary) hypertension: Secondary | ICD-10-CM | POA: Diagnosis not present

## 2019-06-28 LAB — CBC WITH DIFFERENTIAL (CANCER CENTER ONLY)
Abs Immature Granulocytes: 0.03 10*3/uL (ref 0.00–0.07)
Basophils Absolute: 0 10*3/uL (ref 0.0–0.1)
Basophils Relative: 0 %
Eosinophils Absolute: 0.2 10*3/uL (ref 0.0–0.5)
Eosinophils Relative: 2 %
HCT: 39.5 % (ref 39.0–52.0)
Hemoglobin: 12.7 g/dL — ABNORMAL LOW (ref 13.0–17.0)
Immature Granulocytes: 0 %
Lymphocytes Relative: 16 %
Lymphs Abs: 1.1 10*3/uL (ref 0.7–4.0)
MCH: 30.8 pg (ref 26.0–34.0)
MCHC: 32.2 g/dL (ref 30.0–36.0)
MCV: 95.6 fL (ref 80.0–100.0)
Monocytes Absolute: 0.6 10*3/uL (ref 0.1–1.0)
Monocytes Relative: 8 %
Neutro Abs: 5.1 10*3/uL (ref 1.7–7.7)
Neutrophils Relative %: 74 %
Platelet Count: 377 10*3/uL (ref 150–400)
RBC: 4.13 MIL/uL — ABNORMAL LOW (ref 4.22–5.81)
RDW: 13.5 % (ref 11.5–15.5)
WBC Count: 7 10*3/uL (ref 4.0–10.5)
nRBC: 0 % (ref 0.0–0.2)

## 2019-06-28 LAB — CMP (CANCER CENTER ONLY)
ALT: <6 U/L (ref 0–44)
AST: 15 U/L (ref 15–41)
Albumin: 2.9 g/dL — ABNORMAL LOW (ref 3.5–5.0)
Alkaline Phosphatase: 293 U/L — ABNORMAL HIGH (ref 38–126)
Anion gap: 7 (ref 5–15)
BUN: 14 mg/dL (ref 8–23)
CO2: 24 mmol/L (ref 22–32)
Calcium: 8.9 mg/dL (ref 8.9–10.3)
Chloride: 108 mmol/L (ref 98–111)
Creatinine: 1.01 mg/dL (ref 0.61–1.24)
GFR, Est AFR Am: >60 mL/min (ref >60)
GFR, Estimated: >60 mL/min (ref >60)
Glucose, Bld: 111 mg/dL — ABNORMAL HIGH (ref 70–99)
Potassium: 4.3 mmol/L (ref 3.5–5.1)
Sodium: 139 mmol/L (ref 135–145)
Total Bilirubin: 0.3 mg/dL (ref 0.3–1.2)
Total Protein: 6.5 g/dL (ref 6.5–8.1)

## 2019-06-28 NOTE — Progress Notes (Signed)
[facility] OFFICE PROGRESS NOTE   Diagnosis: Prostate cancer  INTERVAL HISTORY:   Mr. Mantione returns as scheduled.  He vomited each time he took [place].  Gillermina Phy was discontinued on 06/10/2019.  He reports an improved appetite.  He no longer has nausea.  No pain.  He relates weight loss today to wearing different clothes.  Objective:  Vital signs in last 24 hours:  Blood pressure 132/76, pulse 84, temperature 98.3 F (36.8 C), temperature source Temporal, resp. rate 16, height 6' 0.5" (1.842 m), weight 174 lb (78.9 kg), SpO2 97 %.    Lab Results:  Lab Results  Component Value Date   WBC 7.0 06/28/2019   HGB 12.7 (L) 06/28/2019   HCT 39.5 06/28/2019   MCV 95.6 06/28/2019   PLT 377 06/28/2019   NEUTROABS 5.1 06/28/2019    CMP  Lab Results  Component Value Date   NA 140 06/06/2019   K 3.3 (L) 06/06/2019   CL 107 06/06/2019   CO2 26 06/06/2019   GLUCOSE 117 (H) 06/06/2019   BUN 11 06/06/2019   CREATININE 0.84 06/06/2019   CALCIUM 8.6 (L) 06/06/2019   PROT 6.2 (L) 06/06/2019   ALBUMIN 2.8 (L) 06/06/2019   AST 20 06/06/2019   ALT 7 06/06/2019   ALKPHOS 372 (H) 06/06/2019   BILITOT 0.6 06/06/2019   GFRNONAA >60 06/06/2019   GFRAA >60 06/06/2019  PSA on 06/06/2019: 60.1  Medications: I have reviewed the patient's current medications.   Assessment/Plan: 1.Prostate cancer, Gleason 7, status post a prostatectomy in 2008  Elevated PSA 2011, status post external beam radiation and androgen deprivation therapy, radiation completed March 2011, last injection July 2011  Elevated PSA October 2016, CT with periaortic and obturator lymphadenopathy  Androgen deprivation therapy resumed and continues, now on every 82-month Lupron  Bone scan 04/18/2017-new foci of metastatic disease at the right supra-acetabulum, left acetabulum, low cervical spine, and L2  CT 04/18/2017-left iliac, periaortic, and retrocrural lymphadenopathy  Bone scan 12/05/2017-progressive  bone metastases; new left hydronephrosis and proximal hydroureter.  CT abdomen/pelvis 12/30/2017-moderate left hydronephrosis with moderate dilatation of the left renal pelvis and proximal left ureter without obstructing calculus. Stable large hiatal hernia. Stable sclerotic lesions in the pelvis and L3 vertebral body concerning for metastatic disease. No enlarged abdominal or pelvic lymph nodes.  Abiraterone/prednisone started 02/07/2018  PSA improved 02/26/2018, higher on 04/06/2018, improved 05/21/2018, higher 03/26/2019, higher 05/09/2019  CTs 05/14/2019-interval progression of sclerotic lesions in the bony pelvis and lumbar spine consistent with progression of bony metastatic disease.  Decrease in the size of a medial right lower lobe pulmonary nodule.  Decrease in size of left external iliac and retroperitoneal lymph nodes.  Left internal ureteral stent with persistent mild left hydronephrosis.  Large hiatal hernia or left hemidiaphragmatic defect, stable.  Zytiga discontinued 05/14/2019  Xtandi 06/02/2019, discontinued 06/10/2019 secondary to nausea/vomiting  2.Hypertension  3.Recurrent hiatal hernia-status post gastrostomy tube placement 09/20/2017  4.Left hydronephrosis status post placement of double-J stent 01/04/2018.     Disposition: Mr. Garguilo has metastatic prostate cancer.  He continues every 55-month Lupron therapy.  He was unable to tolerate Xtandi. The PSA was lower on 06/06/2019, this was after taking Xtandi for less than a week.  The PSA may have been lower from withdrawal of the abiraterone.  He will remain off of an androgen receptor blocker for now.  We will consider adding a different agent if the PSA is higher when he returns next month.  He will continue every  15-month Lupron with Dr. Diona Fanti.  He does not appear to have symptoms related to prostate cancer at present.  Betsy Coder, MD  06/28/2019  11:07 AM

## 2019-06-29 LAB — PROSTATE-SPECIFIC AG, SERUM (LABCORP): Prostate Specific Ag, Serum: 72.6 ng/mL — ABNORMAL HIGH (ref 0.0–4.0)

## 2019-07-04 DIAGNOSIS — R310 Gross hematuria: Secondary | ICD-10-CM | POA: Diagnosis not present

## 2019-07-04 DIAGNOSIS — C61 Malignant neoplasm of prostate: Secondary | ICD-10-CM | POA: Diagnosis not present

## 2019-07-22 ENCOUNTER — Ambulatory Visit: Payer: Medicare Other | Admitting: Internal Medicine

## 2019-07-22 ENCOUNTER — Encounter: Payer: Self-pay | Admitting: Internal Medicine

## 2019-07-22 ENCOUNTER — Other Ambulatory Visit: Payer: Self-pay

## 2019-07-22 DIAGNOSIS — R634 Abnormal weight loss: Secondary | ICD-10-CM

## 2019-07-22 DIAGNOSIS — C7951 Secondary malignant neoplasm of bone: Secondary | ICD-10-CM

## 2019-07-22 DIAGNOSIS — C61 Malignant neoplasm of prostate: Secondary | ICD-10-CM

## 2019-07-22 DIAGNOSIS — E559 Vitamin D deficiency, unspecified: Secondary | ICD-10-CM | POA: Diagnosis not present

## 2019-07-22 DIAGNOSIS — I10 Essential (primary) hypertension: Secondary | ICD-10-CM

## 2019-07-22 MED ORDER — MEGESTROL ACETATE 400 MG/10ML PO SUSP: 200.0000 mg | Freq: Two times a day (BID) | ORAL | 3 refills | Status: AC

## 2019-07-22 MED ORDER — POTASSIUM CHLORIDE ER 10 MEQ PO TBCR: 10.0000 meq | EXTENDED_RELEASE_TABLET | Freq: Every day | ORAL | 3 refills | Status: DC

## 2019-07-22 MED ORDER — LOSARTAN POTASSIUM 100 MG PO TABS: 100.0000 mg | ORAL_TABLET | Freq: Every day | ORAL | 3 refills | Status: DC

## 2019-07-22 MED ORDER — AMLODIPINE BESYLATE 5 MG PO TABS: 5.0000 mg | ORAL_TABLET | Freq: Every day | ORAL | 3 refills | Status: DC

## 2019-07-22 NOTE — Progress Notes (Signed)
Subjective:  Patient ID: Jorge [name], male    DOB: 11/05/1929  Age: 86 y.o. MRN: [id number]  CC: No chief complaint on file.   HPI Jorge [name] presents for prostate cancer, HTN, LBP C/o wt loss - 30 lbs, poor appetite  Outpatient Medications Prior to Visit  Medication Sig Dispense Refill  . diphenhydramine-acetaminophen (TYLENOL PM) 25-500 MG TABS tablet Take 1 tablet by mouth at bedtime as needed.    [name] Leuprolide Acetate, 3 Month, (LUPRON DEPOT-PED, 65-MONTH, IM) Inject into the muscle every 3 (three) months.    . polyethylene glycol powder (GLYCOLAX/MIRALAX) powder Take 17 g by mouth 2 (two) times daily as needed for mild constipation or moderate constipation. 500 g 3  . potassium chloride (KLOR-CON) 10 MEQ tablet Take 1 tablet (10 mEq total) by mouth daily. 30 tablet 1  . traMADol (ULTRAM) 50 MG tablet Take 1 tablet (50 mg total) by mouth every 12 (twelve) hours as needed. Do not drive while taking. 40 tablet 0  . losartan (COZAAR) 100 MG tablet Take 1 tablet by mouth once daily 90 tablet 1  . amLODipine (NORVASC) 5 MG tablet Take 1 tablet (5 mg total) by mouth daily. 90 tablet 3  . enzalutamide (XTANDI) 40 MG capsule Take 4 capsules (160 mg total) by mouth daily. (Patient not taking: Reported on 07/22/2019) 120 capsule 0  . potassium chloride SA (KLOR-CON) 20 MEQ tablet Take 1 tablet (20 mEq total) by mouth daily. (Patient not taking: Reported on 07/22/2019) 10 tablet 0   No facility-administered medications prior to visit.    ROS: Review of Systems  Constitutional: Positive for unexpected weight change. Negative for appetite change and fatigue.  HENT: Negative for congestion, nosebleeds, sneezing, sore throat and trouble swallowing.   Eyes: Negative for itching and visual disturbance.  Respiratory: Negative for cough.   Cardiovascular: Negative for chest pain, palpitations and leg swelling.  Gastrointestinal: Negative for abdominal distention, blood in stool, diarrhea  and nausea.  Genitourinary: Negative for frequency and hematuria.  Musculoskeletal: Negative for back pain, gait problem, joint swelling and neck pain.  Skin: Negative for rash.  Neurological: Negative for dizziness, tremors, speech difficulty and weakness.  Psychiatric/Behavioral: Negative for agitation, dysphoric mood and sleep disturbance. The patient is not nervous/anxious.     Objective:  BP (!) 150/84 (BP Location: Left Arm, Patient Position: Sitting, Cuff Size: Normal)   Pulse 85   Temp 98.2 F (36.8 C) (Oral)   Ht 6' 0.5" (1.842 m)   Wt 171 lb (77.6 kg)   SpO2 96%   BMI 22.87 kg/m   BP Readings from Last 3 Encounters:  07/22/19 (!) 150/84  06/28/19 132/76  06/06/19 139/76    Wt Readings from Last 3 Encounters:  07/22/19 171 lb (77.6 kg)  06/28/19 174 lb (78.9 kg)  06/06/19 177 lb (80.3 kg)    Physical Exam Constitutional:      General: He is not in acute distress.    Appearance: He is well-developed. He is not ill-appearing.     Comments: NAD  Eyes:     Conjunctiva/sclera: Conjunctivae normal.     Pupils: Pupils are equal, round, and reactive to light.  Neck:     Thyroid: No thyromegaly.     Vascular: No JVD.  Cardiovascular:     Rate and Rhythm: Normal rate and regular rhythm.     Heart sounds: Normal heart sounds. No murmur. No friction rub. No gallop.   Pulmonary:     Effort:  Pulmonary effort is normal. No respiratory distress.     Breath sounds: Normal breath sounds. No wheezing or rales.  Chest:     Chest wall: No tenderness.  Abdominal:     General: Bowel sounds are normal. There is no distension.     Palpations: Abdomen is soft. There is no mass.     Tenderness: There is no abdominal tenderness. There is no guarding or rebound.  Musculoskeletal:        General: No tenderness. Normal range of motion.     Cervical back: Normal range of motion.  Lymphadenopathy:     Cervical: No cervical adenopathy.  Skin:    General: Skin is warm and dry.      Findings: No rash.  Neurological:     Mental Status: He is alert and oriented to person, place, and time.     Cranial Nerves: No cranial nerve deficit.     Motor: Weakness present. No abnormal muscle tone.     Coordination: Coordination normal.     Gait: Gait normal.     Deep Tendon Reflexes: Reflexes are normal and symmetric.  Psychiatric:        Behavior: Behavior normal.        Thought Content: Thought content normal.        Judgment: Judgment normal.     Lab Results  Component Value Date   WBC 7.0 06/28/2019   HGB 12.7 (L) 06/28/2019   HCT 39.5 06/28/2019   PLT 377 06/28/2019   GLUCOSE 111 (H) 06/28/2019   CHOL 164 10/21/2015   TRIG 110 05/21/2018   HDL 52.20 10/21/2015   LDLCALC 88 10/21/2015   ALT <6 06/28/2019   AST 15 06/28/2019   NA 139 06/28/2019   K 4.3 06/28/2019   CL 108 06/28/2019   CREATININE 1.01 06/28/2019   BUN 14 06/28/2019   CO2 24 06/28/2019   TSH 1.26 10/21/2015   PSA 0.46 01/10/2007   INR 0.9 04/09/2018   HGBA1C 6.1 (H) 01/10/2007    CT Abdomen Pelvis Wo Contrast  Result Date: 05/14/2019 CLINICAL DATA:  Prostate cancer. Left hip and groin pain. EXAM: CT ABDOMEN AND PELVIS WITHOUT CONTRAST TECHNIQUE: Multidetector CT imaging of the abdomen and pelvis was performed following the standard protocol without IV contrast. COMPARISON:  12/30/2017 FINDINGS: Lower chest: 7 mm medial subpleural nodule at the right base (image 8/series 2) has decreased from 12 mm previously. There is some patchy infrahilar atelectasis or infiltrate in the right lung base. No pleural effusion. The large hiatal hernia or left hemidiaphragmatic defect is stable. Hepatobiliary: No focal abnormality in the liver on this study without intravenous contrast. There is no evidence for gallstones, gallbladder wall thickening, or pericholecystic fluid. No intrahepatic or extrahepatic biliary dilation. Pancreas: No focal mass lesion. No dilatation of the main duct. No intraparenchymal cyst. No  peripancreatic edema. Spleen: No splenomegaly. No focal mass lesion. Adrenals/Urinary Tract: No adrenal nodule or mass. Right kidney and ureter unremarkable. Left internal ureteral stent identified, new in the interval. Proximal loop is formed in the lower pole collecting system. Distal loop is formed in the bladder lumen. Persistent mild left hydronephrosis evident. Bladder is decompressed. Stomach/Bowel: Contained within the large left sided hernia. Duodenum is normally positioned as is the ligament of Treitz. No small bowel wall thickening. No small bowel dilatation. The terminal ileum is normal. Transverse colon contained within the left hemithoracic hernia. No complicating features. Diverticular changes are noted in the left colon without evidence of diverticulitis.  Vascular/Lymphatic: There is abdominal aortic atherosclerosis without aneurysm. There is no gastrohepatic or hepatoduodenal ligament lymphadenopathy. No retroperitoneal or mesenteric lymphadenopathy. 5 mm short axis left external iliac node on 72/2 today has decreased from 14 mm previously. 10 mm short axis left external iliac node on 70/2 was 14 mm previously. Clustered small lymph nodes at the aortic bifurcation seen previously have decreased in the interval. Reproductive: Prostate gland surgically absent. Other: No intraperitoneal free fluid. Musculoskeletal: Small left groin hernia contains only fat. Interval progression of sclerosis in the left superior pubic ramus, acetabulum, and posterior ischial tuberosity sclerotic changes in the roof of the right acetabulum and anterior iliac bones also progressive since prior. 17 mm sclerotic lesion posterior left iliac bone on 66/2 was 5 mm previously. Multiple new sclerotic lesions are identified in the lumbar spine. IMPRESSION: 1. Imaging features suggest interval mixed response to therapy. 2. Interval progression of sclerotic lesions in the bony pelvis and lumbar spine, consistent with progression of  bony metastatic disease. 3. Interval decrease in size of the medial right lower lobe pulmonary nodule. 4. Interval decrease in size of the left external iliac and retroperitoneal lymph nodes. 5. Left internal ureteral stent with persistent mild left hydronephrosis. 6. Large hiatal hernia or left hemidiaphragmatic defect, stable. 7. Left colonic diverticulosis without diverticulitis. 8. Aortic Atherosclerosis (ICD10-I70.0). Electronically Signed   By: Misty Stanley M.D.   On: 05/14/2019 10:11    Assessment & Plan:   There are no diagnoses linked to this encounter.   Meds ordered this encounter  Medications  . amLODipine (NORVASC) 5 MG tablet    Sig: Take 1 tablet (5 mg total) by mouth daily.    Dispense:  90 tablet    Refill:  3  . losartan (COZAAR) 100 MG tablet    Sig: Take 1 tablet (100 mg total) by mouth daily.    Dispense:  90 tablet    Refill:  3     Follow-up: No follow-ups on file.  [name], MD

## 2019-07-22 NOTE — Assessment & Plan Note (Signed)
Meds renewed BP Readings from Last 3 Encounters:  07/22/19 (!) 150/84  06/28/19 132/76  06/06/19 139/76

## 2019-07-22 NOTE — Assessment & Plan Note (Signed)
?  due to cancer/Rx Start Megace

## 2019-07-22 NOTE — Assessment & Plan Note (Signed)
Vit D 

## 2019-07-22 NOTE — Assessment & Plan Note (Signed)
On Lupron

## 2019-07-24 ENCOUNTER — Telehealth: Payer: Self-pay | Admitting: *Deleted

## 2019-07-24 NOTE — Telephone Encounter (Signed)
Called to move his 4/27 appointments to 0000000 due to conflict. Change made.

## 2019-07-26 ENCOUNTER — Other Ambulatory Visit: Payer: Medicare Other

## 2019-07-26 ENCOUNTER — Ambulatory Visit: Payer: Medicare Other | Admitting: Oncology

## 2019-07-29 ENCOUNTER — Encounter (HOSPITAL_COMMUNITY): Payer: Self-pay

## 2019-07-29 ENCOUNTER — Emergency Department (HOSPITAL_COMMUNITY)
Admission: EM | Admit: 2019-07-29 | Discharge: 2019-07-29 | Disposition: A | Discharge status: Home / Self Care | Payer: Medicare Other | Attending: Emergency Medicine | Admitting: Emergency Medicine

## 2019-07-29 ENCOUNTER — Telehealth: Payer: Self-pay | Admitting: *Deleted

## 2019-07-29 ENCOUNTER — Other Ambulatory Visit: Payer: Self-pay

## 2019-07-29 ENCOUNTER — Emergency Department (HOSPITAL_COMMUNITY): Payer: Medicare Other

## 2019-07-29 DIAGNOSIS — N3000 Acute cystitis without hematuria: Secondary | ICD-10-CM | POA: Diagnosis not present

## 2019-07-29 DIAGNOSIS — Z79899 Other long term (current) drug therapy: Secondary | ICD-10-CM | POA: Insufficient documentation

## 2019-07-29 DIAGNOSIS — I10 Essential (primary) hypertension: Secondary | ICD-10-CM | POA: Diagnosis not present

## 2019-07-29 DIAGNOSIS — R110 Nausea: Secondary | ICD-10-CM

## 2019-07-29 DIAGNOSIS — R109 Unspecified abdominal pain: Secondary | ICD-10-CM | POA: Diagnosis present

## 2019-07-29 DIAGNOSIS — E876 Hypokalemia: Secondary | ICD-10-CM | POA: Diagnosis not present

## 2019-07-29 DIAGNOSIS — Z8546 Personal history of malignant neoplasm of prostate: Secondary | ICD-10-CM | POA: Diagnosis not present

## 2019-07-29 DIAGNOSIS — C7951 Secondary malignant neoplasm of bone: Secondary | ICD-10-CM | POA: Diagnosis not present

## 2019-07-29 LAB — URINALYSIS, ROUTINE W REFLEX MICROSCOPIC
Bilirubin Urine: NEGATIVE
Glucose, UA: NEGATIVE mg/dL
Ketones, ur: 5 mg/dL — AB
Nitrite: NEGATIVE
Protein, ur: 100 mg/dL — AB
Specific Gravity, Urine: 1.026 (ref 1.005–1.030)
pH: 5 (ref 5.0–8.0)

## 2019-07-29 LAB — COMPREHENSIVE METABOLIC PANEL
ALT: 16 U/L (ref 0–44)
AST: 43 U/L — ABNORMAL HIGH (ref 15–41)
Albumin: 3.2 g/dL — ABNORMAL LOW (ref 3.5–5.0)
Alkaline Phosphatase: 294 U/L — ABNORMAL HIGH (ref 38–126)
Anion gap: 12 (ref 5–15)
BUN: 20 mg/dL (ref 8–23)
CO2: 21 mmol/L — ABNORMAL LOW (ref 22–32)
Calcium: 9.1 mg/dL (ref 8.9–10.3)
Chloride: 105 mmol/L (ref 98–111)
Creatinine, Ser: 1.21 mg/dL (ref 0.61–1.24)
GFR calc Af Amer: >60 mL/min (ref >60)
GFR calc non Af Amer: 54 mL/min — ABNORMAL LOW (ref >60)
Glucose, Bld: 129 mg/dL — ABNORMAL HIGH (ref 70–99)
Potassium: 4.4 mmol/L (ref 3.5–5.1)
Sodium: 138 mmol/L (ref 135–145)
Total Bilirubin: 0.6 mg/dL (ref 0.3–1.2)
Total Protein: 7.6 g/dL (ref 6.5–8.1)

## 2019-07-29 LAB — CBC
HCT: 40.8 % (ref 39.0–52.0)
Hemoglobin: 12.9 g/dL — ABNORMAL LOW (ref 13.0–17.0)
MCH: 31.3 pg (ref 26.0–34.0)
MCHC: 31.6 g/dL (ref 30.0–36.0)
MCV: 99 fL (ref 80.0–100.0)
Platelets: 497 10*3/uL — ABNORMAL HIGH (ref 150–400)
RBC: 4.12 MIL/uL — ABNORMAL LOW (ref 4.22–5.81)
RDW: 14.5 % (ref 11.5–15.5)
WBC: 15.3 10*3/uL — ABNORMAL HIGH (ref 4.0–10.5)
nRBC: 0 % (ref 0.0–0.2)

## 2019-07-29 LAB — LIPASE, BLOOD: Lipase: 17 U/L (ref 11–51)

## 2019-07-29 MED ORDER — SODIUM CHLORIDE 0.9% FLUSH
3.0000 mL | Freq: Once | INTRAVENOUS | Status: AC
  Administered 2019-07-29: 3 mL via INTRAVENOUS

## 2019-07-29 MED ORDER — CIPROFLOXACIN HCL 500 MG PO TABS
500.0000 mg | ORAL_TABLET | Freq: Two times a day (BID) | ORAL | 0 refills | Status: DC
Start: 2019-07-29 — End: 2019-09-25

## 2019-07-29 MED ORDER — CIPROFLOXACIN HCL 500 MG PO TABS
500.0000 mg | ORAL_TABLET | Freq: Once | ORAL | Status: AC
  Administered 2019-07-29: 500 mg via ORAL
  Filled 2019-07-29: qty 1

## 2019-07-29 MED ORDER — SODIUM CHLORIDE 0.9 % IV SOLN: INTRAVENOUS | Status: DC

## 2019-07-29 MED ORDER — SODIUM CHLORIDE 0.9 % IV BOLUS
1000.0000 mL | Freq: Once | INTRAVENOUS | Status: AC
  Administered 2019-07-29: 1000 mL via INTRAVENOUS

## 2019-07-29 NOTE — ED Provider Notes (Signed)
  Physical Exam  BP (!) 147/87 (BP Location: Left Arm)   Pulse 100   Temp 98.1 F (36.7 C) (Oral)   Resp 18   Ht 6' 0.5" (1.842 m)   Wt 77.6 kg   SpO2 96%   BMI 22.87 kg/m   Physical Exam  ED Course/Procedures     Procedures  MDM  Patient care assumed at 4 PM.  Patient here presenting with poor appetite.  White blood cell count is 15.  Patient has chronic left hydro on CT.  Signout pending UA.  6:25 PM UA showed possible UTI.  Given that he had previous stents and he has chronic hydro-, will give antibiotics.  His previous culture was in 2008.  Patient does have allergies to cephalosporins.  Will discharge home with a course of Cipro.      [name], MD 07/29/19 [phone]

## 2019-07-29 NOTE — ED Triage Notes (Signed)
Patient c/o abdominal pain x 6 weeks, but getting worse. Patient states very nauseated for the past 3-4 days.

## 2019-07-29 NOTE — Telephone Encounter (Signed)
Called to report having nausea/vomiting stomach pains. Has not been able to eat or drink for 3 days. Has also started having pain in his legs. Denies any fever. Asking if Dr. Benay Spice can see him or should he go to the ER? Suggested he get a friend to take him to [facility] emergency room.

## 2019-07-29 NOTE — Discharge Instructions (Addendum)
Take Cipro twice daily for UTI for a week  Stay hydrated.  See your doctor in a week.  Return to ER if you have vomiting, severe abdominal pain, trouble urinating, fever.

## 2019-07-29 NOTE — ED Provider Notes (Addendum)
[facility] DEPT Provider Note   CSN: [id number] Arrival date & time: 07/29/19  1313     History Chief Complaint  Patient presents with   Abdominal Pain    Jorge [name] is a 86 y.o. male.  86 year old male who presents with abdominal pain for 6 weeks but worse over last 3 to 4 days.  He has had nausea but no vomiting.  Some diarrhea.  Patient has had loss of appetite has seen his doctor for this in the past and was given appetite stimulants which she says he has taken about 3 times.  No fever or chills.  Denies any urinary symptoms.  Symptoms have been persistent and nothing makes them better worse no treatment use prior to arrival.        Past Medical History:  Diagnosis Date   Anemia    Anxiety    ED (erectile dysfunction)    First degree heart block    GERD (gastroesophageal reflux disease)    01-30-2019   per pt no issue since surgery 06/ 2019   Hernia, hiatal GI-- dr p. hung   hx s/p [facility] repair 03/ 2013,  recurrent w/ gastric outlet obstruction 06/ 2019 s/p  open takedown/ gastrostomy tube;  last EGD 06-15-219 epic ,  large HH   History of colonic polyps    History of gastric ulcer 2008   w/ erosive esophagitis   History of syncope    01/ 2011   per ED visit in epic ,  vasovagal epidose, negative work-up   Hx of radiation therapy    prostate cancer ,  completed 03/ 2011   Hydronephrosis of left kidney    Hydronephrosis, left    due to malignant lymphadenopathy-- treated with ureteral stent   Hypertension    Insomnia    Metastatic castration-resistant adenocarcinoma of prostate [facility]) urologist-- dr dahlstedt/  oncologist--- dr Benay Spice   incidental finding pt had suprapubic prostatectoy for BPH on  09-18-2006 found to have Gleason 7 prostate cancer;   completed radiation therapy 03/ 2011 for rising PSA;  recurrent w/ mets 06/ 2018   Schatzki's ring    Stricture of esophagus GI--- dr p. hung   (01-30-2019  per pt no  issues since surgery 06/ 2019)   tortuous esophagus (noted in epic since 2008)  s/p  repair large [facility] 03/ 2013  recurrent w/ obstruction s/p takedown 06/ 2019   Urinary incontinence    Vitamin D deficiency    Wears dentures    fuller upper and partial lower    Patient Active Problem List   Diagnosis Date Noted   Lesion of skin of nose 04/23/2019   Hematuria, gross 04/09/2018   Hypokalemia 04/09/2018   Malnutrition ([place]) 04/09/2018   Constipation 12/31/2017   S/P prostatectomy 12/30/2017   Prostate cancer metastatic to bone ([place]) 12/30/2017   Hydronephrosis of left kidney 12/30/2017   Small bowel obstruction ([place]) 09/16/2017   Osteoarthritis 09/12/2017   Hot flash due to medication 04/05/2017   Squamous cell skin cancer 11/15/2016   Prostate cancer ([place]) 02/17/2016   Elbow pain 10/21/2015   Insomnia 08/18/2014   Sebaceous cyst 08/18/2014   Cerumen impaction 07/23/2013   Eczema 08/23/2012   Pain in joint, shoulder region 08/23/2012   LBP (low back pain) 05/21/2012   Actinic keratoses 10/26/2011   Contact dermatitis 09/22/2011   Large hiatal hernia 07/13/2011   Nonspecific (abnormal) findings on radiological and other examination of gastrointestinal tract 06/05/2011   LUQ  abdominal pain 04/22/2011   Chest pain, atypical 04/22/2011   Irreducible hiatal hernia 04/22/2011   Neoplasm of uncertain behavior of skin 11/10/2010   HIP PAIN 04/28/2010   Diarrhea 10/07/2008   ERECTILE DYSFUNCTION 08/22/2008   Weight loss 08/22/2008   PERSONAL HX COLON CANCER 11/06/2007   Vitamin D deficiency 04/19/2007   Anxiety state 04/19/2007   HTN (hypertension) 04/19/2007   GERD 04/19/2007   PEPTIC ULCER DISEASE 04/19/2007   CERVICAL STRAIN 04/19/2007   COLONIC POLYPS, HX OF 04/19/2007    Past Surgical History:  Procedure Laterality Date   APPENDECTOMY  age 68   CATARACT EXTRACTION W/ INTRAOCULAR LENS  IMPLANT, BILATERAL Bilateral yrs ago    CYST REMOVAL NECK N/A 10/16/2014   Procedure: EXCISION CYST POSTERIOR NECK;  Surgeon: Autumn Messing III, MD;  Location: [facility];  Service: General;  Laterality: N/A;  posterior   CYSTOSCOPY W/ RETROGRADES Left 01/04/2018   Procedure: CYSTOSCOPY LEFT STENT PLACEMENT;  Surgeon: Franchot Gallo, MD;  Location: [facility];  Service: Urology;  Laterality: Left;   CYSTOSCOPY W/ URETERAL STENT PLACEMENT Left 01/31/2019   Procedure: CYSTOSCOPY WITH STENT REPLACEMENT;  Surgeon: Franchot Gallo, MD;  Location: [facility];  Service: Urology;  Laterality: Left;   ESOPHAGOGASTRODUODENOSCOPY  06/05/2011   Procedure: ESOPHAGOGASTRODUODENOSCOPY (EGD);  Surgeon: Gatha Mayer, MD;  Location: [facility];  Service: Endoscopy;  Laterality: N/A;   ESOPHAGOGASTRODUODENOSCOPY N/A 09/16/2017   Procedure: ESOPHAGOGASTRODUODENOSCOPY (EGD);  Surgeon: Carol Ada, MD;  Location: [facility];  Service: Endoscopy;  Laterality: N/A;   GASTROSTOMY N/A 09/20/2017   Procedure: INSERTION OF GASTROSTOMY TUBE;  Surgeon: Stark Klein, MD;  Location: [facility];  Service: General;  Laterality: N/A;   HIATAL HERNIA REPAIR  06/10/2011   Procedure: LAPAROSCOPIC REPAIR OF HIATAL HERNIA;  Surgeon: Pedro Earls, MD;  Location: [facility];  Service: General;  Laterality: N/A;  Laparoscopic repair of paraesophageal hernia.   SUPRAPUBIC PROSTATECTOMY  09-18-2006   @[facility]        Family History  Problem Relation Age of Onset   Pancreatic cancer Father    Hypertension Father    Cancer Father    Hypertension Other     Social History   Tobacco Use   Smoking status: Never Smoker   Smokeless tobacco: Never Used  Substance Use Topics   Alcohol use: Not Currently   Drug use: No    Home Medications Prior to Admission medications   Medication Sig Start Date End Date Taking? Authorizing Provider  amLODipine (NORVASC) 5 MG tablet Take 1 tablet (5 mg total) by mouth daily. 07/22/19 07/21/20 Yes  [name], [name], MD  diphenhydramine-acetaminophen (TYLENOL PM) 25-500 MG TABS tablet Take 1 tablet by mouth at bedtime as needed (sleep).    Yes [provider]  Leuprolide Acetate, 3 Month, (LUPRON DEPOT-PED, 39-MONTH, IM) Inject into the muscle every 3 (three) months.   Yes [provider]  losartan (COZAAR) 100 MG tablet Take 1 tablet (100 mg total) by mouth daily. 07/22/19  Yes [name], [name], MD  megestrol (MEGACE) 400 MG/10ML suspension Take 5 mLs (200 mg total) by mouth 2 (two) times daily. 07/22/19 08/21/19 Yes [name], [name], MD  polyethylene glycol powder (GLYCOLAX/MIRALAX) powder Take 17 g by mouth 2 (two) times daily as needed for mild constipation or moderate constipation. 01/10/18  Yes [name], [name], MD  polyvinyl alcohol (LIQUIFILM TEARS) 1.4 % ophthalmic solution Place 1 drop into both eyes as needed for dry eyes.   Yes  [provider]  potassium chloride (KLOR-CON) 10 MEQ tablet Take 1 tablet (10 mEq total) by mouth daily. 07/22/19  Yes [name], [name], MD  traMADol (ULTRAM) 50 MG tablet Take 1 tablet (50 mg total) by mouth every 12 (twelve) hours as needed. Do not drive while taking. Patient not taking: Reported on 07/29/2019 05/09/19   Owens Shark, NP    Allergies    Aspirin, Azithromycin, Cefuroxime axetil, Iodinated diagnostic agents, Iodine, Other, Naproxen, and Penicillins  Review of Systems   Review of Systems  All other systems reviewed and are negative.   Physical Exam Updated Vital Signs BP 126/82 (BP Location: Left Arm)    Pulse (!) 102    Temp 98.1 F (36.7 C) (Oral)    Resp 17    Ht 1.842 m (6' 0.5")    Wt 77.6 kg    SpO2 92%    BMI 22.87 kg/m   Physical Exam Vitals and nursing note reviewed.  Constitutional:      General: He is not in acute distress.    Appearance: Normal appearance. He is well-developed. He is not toxic-appearing.  HENT:     Head: Normocephalic and atraumatic.  Eyes:     General: Lids  are normal.     Conjunctiva/sclera: Conjunctivae normal.     Pupils: Pupils are equal, round, and reactive to light.  Neck:     Thyroid: No thyroid mass.     Trachea: No tracheal deviation.  Cardiovascular:     Rate and Rhythm: Normal rate and regular rhythm.     Heart sounds: Normal heart sounds. No murmur. No gallop.   Pulmonary:     Effort: Pulmonary effort is normal. No respiratory distress.     Breath sounds: Normal breath sounds. No stridor. No decreased breath sounds, wheezing, rhonchi or rales.  Abdominal:     General: Bowel sounds are normal. There is no distension.     Palpations: Abdomen is soft.     Tenderness: There is no abdominal tenderness. There is no rebound.  Musculoskeletal:        General: No tenderness. Normal range of motion.     Cervical back: Normal range of motion and neck supple.  Skin:    General: Skin is warm and dry.     Findings: No abrasion or rash.  Neurological:     Mental Status: He is alert and oriented to person, place, and time.     GCS: GCS eye subscore is 4. GCS verbal subscore is 5. GCS motor subscore is 6.     Cranial Nerves: No cranial nerve deficit.     Sensory: No sensory deficit.  Psychiatric:        Speech: Speech normal.        Behavior: Behavior normal.     ED Results / Procedures / Treatments   Labs (all labs ordered are listed, but only abnormal results are displayed) Labs Reviewed  COMPREHENSIVE METABOLIC PANEL - Abnormal; Notable for the following components:      Result Value   CO2 21 (*)    Glucose, Bld 129 (*)    Albumin 3.2 (*)    AST 43 (*)    Alkaline Phosphatase 294 (*)    GFR calc non Af Amer 54 (*)    All other components within normal limits  CBC - Abnormal; Notable for the following components:   WBC 15.3 (*)    RBC 4.12 (*)    Hemoglobin 12.9 (*)  Platelets 497 (*)    All other components within normal limits  LIPASE, BLOOD  URINALYSIS, ROUTINE W REFLEX MICROSCOPIC    EKG None  Radiology CT  Abdomen Pelvis Wo Contrast  Result Date: 07/29/2019 CLINICAL DATA:  Abdominal pain for 6 weeks, getting worse, abdominal distension, nausea for 3-4 days; history GERD, metastatic prostate cancer post surgery and radiation therapy EXAM: CT ABDOMEN AND PELVIS WITHOUT CONTRAST TECHNIQUE: Multidetector CT imaging of the abdomen and pelvis was performed following the standard protocol without IV contrast. Sagittal and coronal MPR images reconstructed from axial data set. No oral contrast administered. COMPARISON:  05/14/2019 FINDINGS: Lower chest: Significant peribronchial thickening and mild atelectasis at RIGHT lung base. LEFT lung base inadequately visualized due to large hiatal hernia see below. Small pericardial effusion. Hepatobiliary: Gallbladder and liver normal appearance Pancreas: Atrophic pancreas without mass Spleen: Normal appearance Adrenals/Urinary Tract: Adrenal glands and RIGHT kidney normal appearance. Atrophic LEFT kidney with cortical thinning and chronic hydronephrosis/hydroureter despite presence of a ureteral stent which extends to the urinary bladder. Stomach/Bowel: Majority of stomach extends into a large hiatal hernia. Paraesophageal of the transverse colon as well as a large amount of fat into the inferior LEFT hemithorax. Mid stomach underdistended, cannot exclude wall thickening in this setting. Bowel loops unremarkable. Appendix surgically absent by history. Vascular/Lymphatic: Atherosclerotic calcifications aorta without aneurysm. Few normal sized pelvic lymph nodes. No definite adenopathy. Reproductive: Prior prostatectomy. Other: Mild chronic stranding of tissue planes in the lateral LEFT pelvis. Tiny LEFT inguinal hernia containing fat. No free air or free fluid. Musculoskeletal: Significant areas of osseous sclerosis identified consistent with sclerotic osseous metastases involving a few ribs, thoracolumbar vertebra, pelvis, and proximal LEFT femur. IMPRESSION: Metastatic prostate  cancer to bone. Persistent LEFT hydronephrosis and hydroureter despite ureteral stent, associated with LEFT renal cortical atrophy. Large hiatal hernia with paraesophageal herniation of transverse colon. Peribronchial thickening and atelectasis at RIGHT lung base. Tiny LEFT inguinal hernia containing fat. Electronically Signed   By: Lavonia Dana M.D.   On: 07/29/2019 15:15    Procedures Procedures (including critical care time)  Medications Ordered in ED Medications  sodium chloride flush (NS) 0.9 % injection 3 mL (3 mLs Intravenous Given 07/29/19 1355)    ED Course  I have reviewed the triage vital signs and the nursing notes.  Pertinent labs & imaging results that were available during my care of the patient were reviewed by me and considered in my medical decision making (see chart for details).    MDM Rules/Calculators/A&P                      Patient did receive 1 L of IV fluids here for patient's subjective feeling of dehydration Patient did have a CBC which showed a mild leukocytosis of 15,000.  Abdomen CT did not show any new acute findings when compared to prior studies. Will check urinalysis and will sign out to Dr. Darl Householder.  Final Clinical Impression(s) / ED Diagnoses Final diagnoses:  None    Rx / DC Orders ED Discharge Orders    None       Lacretia Leigh, MD 07/29/19 1646    Lacretia Leigh, MD 07/29/19 [id number]

## 2019-07-30 ENCOUNTER — Ambulatory Visit: Payer: Medicare Other | Admitting: Oncology

## 2019-07-30 ENCOUNTER — Other Ambulatory Visit: Payer: Medicare Other

## 2019-07-31 ENCOUNTER — Other Ambulatory Visit: Payer: Self-pay

## 2019-07-31 LAB — URINE CULTURE: Culture: NO GROWTH

## 2019-08-01 ENCOUNTER — Inpatient Hospital Stay: Payer: Medicare Other

## 2019-08-01 ENCOUNTER — Telehealth: Payer: Self-pay | Admitting: Oncology

## 2019-08-01 ENCOUNTER — Inpatient Hospital Stay: Payer: Medicare Other | Attending: Oncology | Admitting: Oncology

## 2019-08-01 ENCOUNTER — Other Ambulatory Visit: Payer: Self-pay

## 2019-08-01 VITALS — BP 127/68 | HR 87 | Temp 98.3°F | Resp 17 | Ht 72.5 in | Wt 171.6 lb

## 2019-08-01 DIAGNOSIS — R109 Unspecified abdominal pain: Secondary | ICD-10-CM | POA: Insufficient documentation

## 2019-08-01 DIAGNOSIS — I10 Essential (primary) hypertension: Secondary | ICD-10-CM | POA: Diagnosis not present

## 2019-08-01 DIAGNOSIS — N1330 Unspecified hydronephrosis: Secondary | ICD-10-CM | POA: Diagnosis not present

## 2019-08-01 DIAGNOSIS — C7951 Secondary malignant neoplasm of bone: Secondary | ICD-10-CM | POA: Diagnosis not present

## 2019-08-01 DIAGNOSIS — N261 Atrophy of kidney (terminal): Secondary | ICD-10-CM | POA: Diagnosis not present

## 2019-08-01 DIAGNOSIS — R140 Abdominal distension (gaseous): Secondary | ICD-10-CM | POA: Diagnosis not present

## 2019-08-01 DIAGNOSIS — K449 Diaphragmatic hernia without obstruction or gangrene: Secondary | ICD-10-CM | POA: Diagnosis not present

## 2019-08-01 DIAGNOSIS — R112 Nausea with vomiting, unspecified: Secondary | ICD-10-CM | POA: Insufficient documentation

## 2019-08-01 DIAGNOSIS — R591 Generalized enlarged lymph nodes: Secondary | ICD-10-CM | POA: Diagnosis not present

## 2019-08-01 DIAGNOSIS — I313 Pericardial effusion (noninflammatory): Secondary | ICD-10-CM | POA: Diagnosis not present

## 2019-08-01 DIAGNOSIS — C61 Malignant neoplasm of prostate: Secondary | ICD-10-CM | POA: Diagnosis not present

## 2019-08-01 LAB — CBC WITH DIFFERENTIAL (CANCER CENTER ONLY)
Abs Immature Granulocytes: 0.04 10*3/uL (ref 0.00–0.07)
Basophils Absolute: 0 10*3/uL (ref 0.0–0.1)
Basophils Relative: 0 %
Eosinophils Absolute: 0 10*3/uL (ref 0.0–0.5)
Eosinophils Relative: 0 %
HCT: 32.6 % — ABNORMAL LOW (ref 39.0–52.0)
Hemoglobin: 10.3 g/dL — ABNORMAL LOW (ref 13.0–17.0)
Immature Granulocytes: 1 %
Lymphocytes Relative: 15 %
Lymphs Abs: 1.1 10*3/uL (ref 0.7–4.0)
MCH: 30.7 pg (ref 26.0–34.0)
MCHC: 31.6 g/dL (ref 30.0–36.0)
MCV: 97 fL (ref 80.0–100.0)
Monocytes Absolute: 0.8 10*3/uL (ref 0.1–1.0)
Monocytes Relative: 10 %
Neutro Abs: 5.3 10*3/uL (ref 1.7–7.7)
Neutrophils Relative %: 74 %
Platelet Count: 413 10*3/uL — ABNORMAL HIGH (ref 150–400)
RBC: 3.36 MIL/uL — ABNORMAL LOW (ref 4.22–5.81)
RDW: 14.5 % (ref 11.5–15.5)
WBC Count: 7.3 10*3/uL (ref 4.0–10.5)
nRBC: 0 % (ref 0.0–0.2)

## 2019-08-01 LAB — CMP (CANCER CENTER ONLY)
ALT: 52 U/L — ABNORMAL HIGH (ref 0–44)
AST: 98 U/L — ABNORMAL HIGH (ref 15–41)
Albumin: 2.3 g/dL — ABNORMAL LOW (ref 3.5–5.0)
Alkaline Phosphatase: 197 U/L — ABNORMAL HIGH (ref 38–126)
Anion gap: 11 (ref 5–15)
BUN: 21 mg/dL (ref 8–23)
CO2: 22 mmol/L (ref 22–32)
Calcium: 9 mg/dL (ref 8.9–10.3)
Chloride: 109 mmol/L (ref 98–111)
Creatinine: 0.96 mg/dL (ref 0.61–1.24)
GFR, Est AFR Am: >60 mL/min (ref >60)
GFR, Estimated: >60 mL/min (ref >60)
Glucose, Bld: 95 mg/dL (ref 70–99)
Potassium: 3.9 mmol/L (ref 3.5–5.1)
Sodium: 142 mmol/L (ref 135–145)
Total Bilirubin: 0.3 mg/dL (ref 0.3–1.2)
Total Protein: 6.2 g/dL — ABNORMAL LOW (ref 6.5–8.1)

## 2019-08-01 NOTE — Telephone Encounter (Signed)
Scheduled appt per 4/29 los. ° °Printed calendar and avs. °

## 2019-08-01 NOTE — Progress Notes (Signed)
[facility] OFFICE PROGRESS NOTE   Diagnosis: Prostate cancer  INTERVAL HISTORY:   Jorge [name] returns as scheduled.  He was seen in the emergency room with abdominal discomfort and "dry heaves "on 07/29/2019.  A CT revealed no acute change.  He was placed on ciprofloxacin for a possible urinary tract infection.  He reports feeling better than he has in months since starting the ciprofloxacin.  No pain.  Objective:  Vital signs in last 24 hours:  Blood pressure 127/68, pulse 87, temperature 98.3 F (36.8 C), temperature source Temporal, resp. rate 17, height 6' 0.5" (1.842 m), weight 171 lb 9.6 oz (77.8 kg), SpO2 98 %.   Resp: Lungs clear bilaterally Cardio: Regular rate and rhythm GI: Soft and nontender, no mass, no hepatosplenomegaly Vascular: Trace pitting edema at the left greater than right lower leg and ankle     Lab Results:  Lab Results  Component Value Date   WBC 7.3 08/01/2019   HGB 10.3 (L) 08/01/2019   HCT 32.6 (L) 08/01/2019   MCV 97.0 08/01/2019   PLT 413 (H) 08/01/2019   NEUTROABS 5.3 08/01/2019    CMP  Lab Results  Component Value Date   NA 138 07/29/2019   K 4.4 07/29/2019   CL 105 07/29/2019   CO2 21 (L) 07/29/2019   GLUCOSE 129 (H) 07/29/2019   BUN 20 07/29/2019   CREATININE 1.21 07/29/2019   CALCIUM 9.1 07/29/2019   PROT 7.6 07/29/2019   ALBUMIN 3.2 (L) 07/29/2019   AST 43 (H) 07/29/2019   ALT 16 07/29/2019   ALKPHOS 294 (H) 07/29/2019   BILITOT 0.6 07/29/2019   GFRNONAA 54 (L) 07/29/2019   GFRAA >60 07/29/2019     Imaging:  CT Abdomen Pelvis Wo Contrast  Result Date: 07/29/2019 CLINICAL DATA:  Abdominal pain for 6 weeks, getting worse, abdominal distension, nausea for 3-4 days; history GERD, metastatic prostate cancer post surgery and radiation therapy EXAM: CT ABDOMEN AND PELVIS WITHOUT CONTRAST TECHNIQUE: Multidetector CT imaging of the abdomen and pelvis was performed following the standard protocol without IV  contrast. Sagittal and coronal MPR images reconstructed from axial data set. No oral contrast administered. COMPARISON:  05/14/2019 FINDINGS: Lower chest: Significant peribronchial thickening and mild atelectasis at RIGHT lung base. LEFT lung base inadequately visualized due to large hiatal hernia see below. Small pericardial effusion. Hepatobiliary: Gallbladder and liver normal appearance Pancreas: Atrophic pancreas without mass Spleen: Normal appearance Adrenals/Urinary Tract: Adrenal glands and RIGHT kidney normal appearance. Atrophic LEFT kidney with cortical thinning and chronic hydronephrosis/hydroureter despite presence of a ureteral stent which extends to the urinary bladder. Stomach/Bowel: Majority of stomach extends into a large hiatal hernia. Paraesophageal of the transverse colon as well as a large amount of fat into the inferior LEFT hemithorax. Mid stomach underdistended, cannot exclude wall thickening in this setting. Bowel loops unremarkable. Appendix surgically absent by history. Vascular/Lymphatic: Atherosclerotic calcifications aorta without aneurysm. Few normal sized pelvic lymph nodes. No definite adenopathy. Reproductive: Prior prostatectomy. Other: Mild chronic stranding of tissue planes in the lateral LEFT pelvis. Tiny LEFT inguinal hernia containing fat. No free air or free fluid. Musculoskeletal: Significant areas of osseous sclerosis identified consistent with sclerotic osseous metastases involving a few ribs, thoracolumbar vertebra, pelvis, and proximal LEFT femur. IMPRESSION: Metastatic prostate cancer to bone. Persistent LEFT hydronephrosis and hydroureter despite ureteral stent, associated with LEFT renal cortical atrophy. Large hiatal hernia with paraesophageal herniation of transverse colon. Peribronchial thickening and atelectasis at RIGHT lung base. Tiny LEFT inguinal hernia containing  fat. Electronically Signed   By: Lavonia Dana M.D.   On: 07/29/2019 15:15    Medications: I  have reviewed the patient's current medications.   Assessment/Plan: 1.Prostate cancer, Gleason 7, status post a prostatectomy in 2008  Elevated PSA 2011, status post external beam radiation and androgen deprivation therapy, radiation completed March 2011, last injection July 2011  Elevated PSA October 2016, CT with periaortic and obturator lymphadenopathy  Androgen deprivation therapy resumed and continues, now on every 29-month Lupron  Bone scan 04/18/2017-new foci of metastatic disease at the right supra-acetabulum, left acetabulum, low cervical spine, and L2  CT 04/18/2017-left iliac, periaortic, and retrocrural lymphadenopathy  Bone scan 12/05/2017-progressive bone metastases; new left hydronephrosis and proximal hydroureter.  CT abdomen/pelvis 12/30/2017-moderate left hydronephrosis with moderate dilatation of the left renal pelvis and proximal left ureter without obstructing calculus. Stable large hiatal hernia. Stable sclerotic lesions in the pelvis and L3 vertebral body concerning for metastatic disease. No enlarged abdominal or pelvic lymph nodes.  Abiraterone/prednisone started 02/07/2018  PSA improved 02/26/2018, higher on 04/06/2018, improved 05/21/2018, higher 03/26/2019, higher 05/09/2019  CTs 05/14/2019-interval progression of sclerotic lesions in the bony pelvis and lumbar spine consistent with progression of bony metastatic disease.  Decrease in the size of a medial right lower lobe pulmonary nodule.  Decrease in size of left external iliac and retroperitoneal lymph nodes.  Left internal ureteral stent with persistent mild left hydronephrosis.  Large hiatal hernia or left hemidiaphragmatic defect, stable.  Zytiga discontinued 05/14/2019  Xtandi 06/02/2019, discontinued 06/10/2019 secondary to nausea/vomiting   CT abdomen/pelvis 07/29/2019-chronic left hydronephrosis, left ureter stent, large hiatal hernia, no adenopathy sclerotic bone metastases  2.Hypertension  3.Recurrent  hiatal hernia-status post gastrostomy tube placement 09/20/2017  4.Left hydronephrosis status post placement of double-J stent 01/04/2018.   Disposition: Jorge [name] has metastatic prostate cancer.  He continues every 50-month Lupron and is otherwise off of specific therapy for prostate cancer.  The etiology of the GI symptoms is unclear.  I doubt the "heaves "are related to prostate cancer.  It is possible his symptoms are related to the left hydronephrosis/stent, but a urine culture returned negative.  I suspect the GI symptoms are related to the large hiatal hernia.  I will present his case at the GI tumor conference and get the opinion of the general surgeons.  Jorge [name] feels that the nausea he had with Xtandi several months ago may not have been drug related.  He will return for an office visit in 1 month.  Betsy Coder, MD  08/01/2019  9:39 AM

## 2019-08-02 LAB — PROSTATE-SPECIFIC AG, SERUM (LABCORP): Prostate Specific Ag, Serum: 257 ng/mL — ABNORMAL HIGH (ref 0.0–4.0)

## 2019-08-05 ENCOUNTER — Telehealth: Payer: Self-pay | Admitting: *Deleted

## 2019-08-05 DIAGNOSIS — C61 Malignant neoplasm of prostate: Secondary | ICD-10-CM

## 2019-08-05 NOTE — Telephone Encounter (Signed)
Notified of CBC results--he denies any bleeding. Informed him that his N/V could be related to large hiatal hernia he has. MD suggests return lab/OV week of 5/10 and at that time discuss resuming the xtandi. Was informed of current PSA result. He understands and agrees. Scheduling message sent.

## 2019-08-05 NOTE — Telephone Encounter (Signed)
-----   Message from Ladell Pier, MD sent at 08/02/2019  4:53 PM EDT ----- Please call patient, hemoglobin is lower, bleeding?, call for symptoms of anemia Suspect n/v due to large hiatal hernia Will consider resuming xtandi at next visit  Schedule office lisa and cbc,cmet, retic, blood bank, week of 5/10

## 2019-08-09 ENCOUNTER — Telehealth: Payer: Self-pay | Admitting: Oncology

## 2019-08-09 DIAGNOSIS — R8281 Pyuria: Secondary | ICD-10-CM | POA: Diagnosis not present

## 2019-08-09 DIAGNOSIS — C61 Malignant neoplasm of prostate: Secondary | ICD-10-CM | POA: Diagnosis not present

## 2019-08-09 DIAGNOSIS — R310 Gross hematuria: Secondary | ICD-10-CM | POA: Diagnosis not present

## 2019-08-09 DIAGNOSIS — R8271 Bacteriuria: Secondary | ICD-10-CM | POA: Diagnosis not present

## 2019-08-09 NOTE — Telephone Encounter (Signed)
Scheduled appt per 5/3 sch message - pt aware of appt

## 2019-08-13 ENCOUNTER — Encounter: Payer: Self-pay | Admitting: Nurse Practitioner

## 2019-08-13 ENCOUNTER — Inpatient Hospital Stay: Payer: Medicare Other | Attending: Oncology | Admitting: Nurse Practitioner

## 2019-08-13 ENCOUNTER — Inpatient Hospital Stay: Payer: Medicare Other

## 2019-08-13 ENCOUNTER — Other Ambulatory Visit: Payer: Self-pay

## 2019-08-13 ENCOUNTER — Encounter: Payer: Self-pay | Admitting: *Deleted

## 2019-08-13 VITALS — BP 153/84 | HR 86 | Temp 98.0°F | Resp 18 | Ht 72.5 in | Wt 172.0 lb

## 2019-08-13 DIAGNOSIS — K5900 Constipation, unspecified: Secondary | ICD-10-CM | POA: Diagnosis not present

## 2019-08-13 DIAGNOSIS — C61 Malignant neoplasm of prostate: Secondary | ICD-10-CM | POA: Diagnosis not present

## 2019-08-13 DIAGNOSIS — Z923 Personal history of irradiation: Secondary | ICD-10-CM | POA: Insufficient documentation

## 2019-08-13 DIAGNOSIS — N1330 Unspecified hydronephrosis: Secondary | ICD-10-CM | POA: Diagnosis not present

## 2019-08-13 DIAGNOSIS — I10 Essential (primary) hypertension: Secondary | ICD-10-CM | POA: Insufficient documentation

## 2019-08-13 DIAGNOSIS — R112 Nausea with vomiting, unspecified: Secondary | ICD-10-CM | POA: Insufficient documentation

## 2019-08-13 DIAGNOSIS — C7951 Secondary malignant neoplasm of bone: Secondary | ICD-10-CM | POA: Diagnosis not present

## 2019-08-13 DIAGNOSIS — K449 Diaphragmatic hernia without obstruction or gangrene: Secondary | ICD-10-CM | POA: Diagnosis not present

## 2019-08-13 LAB — CBC WITH DIFFERENTIAL (CANCER CENTER ONLY)
Abs Immature Granulocytes: 0.02 10*3/uL (ref 0.00–0.07)
Basophils Absolute: 0 10*3/uL (ref 0.0–0.1)
Basophils Relative: 0 %
Eosinophils Absolute: 0.1 10*3/uL (ref 0.0–0.5)
Eosinophils Relative: 1 %
HCT: 37.7 % — ABNORMAL LOW (ref 39.0–52.0)
Hemoglobin: 11.7 g/dL — ABNORMAL LOW (ref 13.0–17.0)
Immature Granulocytes: 0 %
Lymphocytes Relative: 15 %
Lymphs Abs: 1.1 10*3/uL (ref 0.7–4.0)
MCH: 30.2 pg (ref 26.0–34.0)
MCHC: 31 g/dL (ref 30.0–36.0)
MCV: 97.2 fL (ref 80.0–100.0)
Monocytes Absolute: 0.6 10*3/uL (ref 0.1–1.0)
Monocytes Relative: 8 %
Neutro Abs: 5.4 10*3/uL (ref 1.7–7.7)
Neutrophils Relative %: 76 %
Platelet Count: 423 10*3/uL — ABNORMAL HIGH (ref 150–400)
RBC: 3.88 MIL/uL — ABNORMAL LOW (ref 4.22–5.81)
RDW: 15 % (ref 11.5–15.5)
WBC Count: 7.1 10*3/uL (ref 4.0–10.5)
nRBC: 0 % (ref 0.0–0.2)

## 2019-08-13 LAB — SAMPLE TO BLOOD BANK

## 2019-08-13 LAB — CMP (CANCER CENTER ONLY)
ALT: 10 U/L (ref 0–44)
AST: 19 U/L (ref 15–41)
Albumin: 2.8 g/dL — ABNORMAL LOW (ref 3.5–5.0)
Alkaline Phosphatase: 240 U/L — ABNORMAL HIGH (ref 38–126)
Anion gap: 7 (ref 5–15)
BUN: 16 mg/dL (ref 8–23)
CO2: 22 mmol/L (ref 22–32)
Calcium: 9.1 mg/dL (ref 8.9–10.3)
Chloride: 109 mmol/L (ref 98–111)
Creatinine: 0.98 mg/dL (ref 0.61–1.24)
GFR, Est AFR Am: >60 mL/min (ref >60)
GFR, Estimated: >60 mL/min (ref >60)
Glucose, Bld: 100 mg/dL — ABNORMAL HIGH (ref 70–99)
Potassium: 4.2 mmol/L (ref 3.5–5.1)
Sodium: 138 mmol/L (ref 135–145)
Total Bilirubin: 0.3 mg/dL (ref 0.3–1.2)
Total Protein: 6.6 g/dL (ref 6.5–8.1)

## 2019-08-13 LAB — RETIC PANEL
Immature Retic Fract: 15.7 % (ref 2.3–15.9)
RBC.: 3.79 MIL/uL — ABNORMAL LOW (ref 4.22–5.81)
Retic Count, Absolute: 70.1 10*3/uL (ref 19.0–186.0)
Retic Ct Pct: 1.9 % (ref 0.4–3.1)
Reticulocyte Hemoglobin: 35.5 pg (ref >27.9)

## 2019-08-13 NOTE — Progress Notes (Addendum)
[place] OFFICE PROGRESS NOTE   Diagnosis: Prostate cancer  INTERVAL HISTORY:   Jorge [name] returns as scheduled.  Nausea/vomiting continue to be improved.  Appetite is better.  He thinks he has gained a "half pound".  He describes his pain as "not too bad".  He continues to feel weak.  He denies bleeding.  Objective:  Vital signs in last 24 hours:  Blood pressure (!) 153/84, pulse 86, temperature 98 F (36.7 C), temperature source Temporal, resp. rate 18, height 6' 0.5" (1.842 m), weight 172 lb (78 kg), SpO2 98 %.    HEENT: No thrush or ulcers. Resp: Lungs clear bilaterally. Cardio: Regular rate and rhythm. GI: Abdomen soft and nontender.  No hepatosplenomegaly. Vascular: Trace edema at the ankles bilaterally.  Lab Results:  Lab Results  Component Value Date   WBC 7.1 08/13/2019   HGB 11.7 (L) 08/13/2019   HCT 37.7 (L) 08/13/2019   MCV 97.2 08/13/2019   PLT 423 (H) 08/13/2019   NEUTROABS 5.4 08/13/2019    Imaging:  No results found.  Medications: I have reviewed the patient's current medications.  Assessment/Plan: 1.Prostate cancer, Gleason 7, status post a prostatectomy in 2008  Elevated PSA 2011, status post external beam radiation and androgen deprivation therapy, radiation completed March 2011, last injection July 2011  Elevated PSA October 2016, CT with periaortic and obturator lymphadenopathy  Androgen deprivation therapy resumed and continues, now on every 81-month Lupron  Bone scan 04/18/2017-new foci of metastatic disease at the right supra-acetabulum, left acetabulum, low cervical spine, and L2  CT 04/18/2017-left iliac, periaortic, and retrocrural lymphadenopathy  Bone scan 12/05/2017-progressive bone metastases; new left hydronephrosis and proximal hydroureter.  CT abdomen/pelvis 12/30/2017-moderate left hydronephrosis with moderate dilatation of the left renal pelvis and proximal left ureter without obstructing calculus. [name]  large hiatal hernia. [name] sclerotic lesions in the pelvis and L3 vertebral body concerning for metastatic disease. No enlarged abdominal or pelvic lymph nodes.  Abiraterone/prednisone started 02/07/2018  PSA improved 02/26/2018, higher on 04/06/2018, improved 05/21/2018, higher 03/26/2019,higher 05/09/2019  CTs 05/14/2019-interval progression of sclerotic lesions in the bony pelvis and lumbar spine consistent with progression of bony metastatic disease. Decrease in the size of a medial right lower lobe pulmonary nodule. Decrease in size of left external iliac and retroperitoneal lymph nodes. Left internal ureteral stent with persistent mild left hydronephrosis. Large hiatal hernia or left hemidiaphragmatic defect, [name].  Zytiga discontinued 05/14/2019  Xtandi 06/02/2019, discontinued 06/10/2019 secondary to nausea/vomiting   CT abdomen/pelvis 07/29/2019-chronic left hydronephrosis, left ureter stent, large hiatal hernia, no adenopathy sclerotic bone metastases  08/01/2019 PSA 257 (06/28/2019 PSA 72.6)  Xtandi resumed 08/13/2019  2.Hypertension  3.Recurrent hiatal hernia-status post gastrostomy tube placement 09/20/2017  4.Left hydronephrosis status post placement of double-J stent 01/04/2018.   Disposition: Jorge [name].  We discussed the PSA result from 08/01/2019.  Dr. Benay Spice recommends resuming Jorge [name].  Jorge [name] agrees.  The nausea/vomiting continue to be improved.  Symptoms may be related to the large hiatal hernia.  We made a referral to Dr. Barry Dienes to evaluate.  We reviewed the CBC from today.  Hemoglobin is better.  He will return for lab and follow-up as scheduled 08/29/2019.  He will contact the office in the interim with any problems.  Patient seen with Dr. Benay Spice.    Jorge [name] ANP/GNP-BC   08/13/2019  2:24 PM This was a shared visit with Jorge [name].  Jorge [name] appears well today.  He will resume Xtandi treatment.  The nausea/vomiting  and  weight loss may be related to the large hiatal hernia as opposed to prostate cancer.  He has undergone hiatal hernia surgery in the past.  We will refer him to Dr.s Martin/Byerly.  Jorge Manson, MD

## 2019-08-13 NOTE — Progress Notes (Signed)
Faxed referral order, demographics and chart information to [facility] referral coordinator at [phone].

## 2019-08-14 ENCOUNTER — Other Ambulatory Visit: Payer: Self-pay

## 2019-08-29 ENCOUNTER — Inpatient Hospital Stay: Payer: Medicare Other

## 2019-08-29 ENCOUNTER — Other Ambulatory Visit: Payer: Self-pay

## 2019-08-29 ENCOUNTER — Encounter: Payer: Self-pay | Admitting: Nurse Practitioner

## 2019-08-29 ENCOUNTER — Inpatient Hospital Stay: Payer: Medicare Other | Admitting: Nurse Practitioner

## 2019-08-29 VITALS — BP 143/77 | HR 78 | Temp 98.1°F | Resp 16 | Ht 72.5 in | Wt 167.6 lb

## 2019-08-29 DIAGNOSIS — N1330 Unspecified hydronephrosis: Secondary | ICD-10-CM | POA: Diagnosis not present

## 2019-08-29 DIAGNOSIS — C7951 Secondary malignant neoplasm of bone: Secondary | ICD-10-CM | POA: Diagnosis not present

## 2019-08-29 DIAGNOSIS — C61 Malignant neoplasm of prostate: Secondary | ICD-10-CM

## 2019-08-29 DIAGNOSIS — I10 Essential (primary) hypertension: Secondary | ICD-10-CM | POA: Diagnosis not present

## 2019-08-29 DIAGNOSIS — R112 Nausea with vomiting, unspecified: Secondary | ICD-10-CM | POA: Diagnosis not present

## 2019-08-29 DIAGNOSIS — K5900 Constipation, unspecified: Secondary | ICD-10-CM | POA: Diagnosis not present

## 2019-08-29 DIAGNOSIS — K449 Diaphragmatic hernia without obstruction or gangrene: Secondary | ICD-10-CM | POA: Diagnosis not present

## 2019-08-29 DIAGNOSIS — Z923 Personal history of irradiation: Secondary | ICD-10-CM | POA: Diagnosis not present

## 2019-08-29 LAB — CMP (CANCER CENTER ONLY)
ALT: 13 U/L (ref 0–44)
AST: 27 U/L (ref 15–41)
Albumin: 3.3 g/dL — ABNORMAL LOW (ref 3.5–5.0)
Alkaline Phosphatase: 278 U/L — ABNORMAL HIGH (ref 38–126)
Anion gap: 7 (ref 5–15)
BUN: 14 mg/dL (ref 8–23)
CO2: 23 mmol/L (ref 22–32)
Calcium: 8.7 mg/dL — ABNORMAL LOW (ref 8.9–10.3)
Chloride: 108 mmol/L (ref 98–111)
Creatinine: 0.98 mg/dL (ref 0.61–1.24)
GFR, Est AFR Am: >60 mL/min (ref >60)
GFR, Estimated: >60 mL/min (ref >60)
Glucose, Bld: 111 mg/dL — ABNORMAL HIGH (ref 70–99)
Potassium: 4.1 mmol/L (ref 3.5–5.1)
Sodium: 138 mmol/L (ref 135–145)
Total Bilirubin: 0.5 mg/dL (ref 0.3–1.2)
Total Protein: 6.4 g/dL — ABNORMAL LOW (ref 6.5–8.1)

## 2019-08-29 LAB — CBC WITH DIFFERENTIAL (CANCER CENTER ONLY)
Abs Immature Granulocytes: 0.04 10*3/uL (ref 0.00–0.07)
Basophils Absolute: 0 10*3/uL (ref 0.0–0.1)
Basophils Relative: 0 %
Eosinophils Absolute: 0 10*3/uL (ref 0.0–0.5)
Eosinophils Relative: 1 %
HCT: 37.9 % — ABNORMAL LOW (ref 39.0–52.0)
Hemoglobin: 12 g/dL — ABNORMAL LOW (ref 13.0–17.0)
Immature Granulocytes: 1 %
Lymphocytes Relative: 13 %
Lymphs Abs: 1 10*3/uL (ref 0.7–4.0)
MCH: 30.6 pg (ref 26.0–34.0)
MCHC: 31.7 g/dL (ref 30.0–36.0)
MCV: 96.7 fL (ref 80.0–100.0)
Monocytes Absolute: 0.9 10*3/uL (ref 0.1–1.0)
Monocytes Relative: 11 %
Neutro Abs: 6 10*3/uL (ref 1.7–7.7)
Neutrophils Relative %: 74 %
Platelet Count: 293 10*3/uL (ref 150–400)
RBC: 3.92 MIL/uL — ABNORMAL LOW (ref 4.22–5.81)
RDW: 14.8 % (ref 11.5–15.5)
WBC Count: 8 10*3/uL (ref 4.0–10.5)
nRBC: 0 % (ref 0.0–0.2)

## 2019-08-29 NOTE — Progress Notes (Addendum)
[place] OFFICE PROGRESS NOTE   Diagnosis: Prostate cancer  INTERVAL HISTORY:   Jorge [name] returns as scheduled.  He resumed Xtandi following his last office visit 08/13/2019.  He tried taking it 4 or 6 times and had dry heaves each time within minutes of swallowing pill.  He is able to take his other medications without difficulty.  He notes early satiety.  He denies pain.  Recent constipation.  He plans on taking a laxative once he returns home today.  Objective:  Vital signs in last 24 hours:  Blood pressure (!) 143/77, pulse 78, temperature 98.1 F (36.7 C), temperature source Temporal, resp. rate 16, height 6' 0.5" (1.842 m), weight 167 lb 9.6 oz (76 kg), SpO2 98 %.    GI: Abdomen soft and nontender.  No hepatosplenomegaly. Vascular: No leg edema. Neuro: Alert and oriented.    Lab Results:  Lab Results  Component Value Date   WBC 8.0 08/29/2019   HGB 12.0 (L) 08/29/2019   HCT 37.9 (L) 08/29/2019   MCV 96.7 08/29/2019   PLT 293 08/29/2019   NEUTROABS 6.0 08/29/2019    Imaging:  No results found.  Medications: I have reviewed the patient's current medications.  Assessment/Plan: 1.Prostate cancer, Gleason 7, status post a prostatectomy in 2008  Elevated PSA 2011, status post external beam radiation and androgen deprivation therapy, radiation completed March 2011, last injection July 2011  Elevated PSA October 2016, CT with periaortic and obturator lymphadenopathy  Androgen deprivation therapy resumed and continues, [name] on every 73-month Lupron  Bone scan 04/18/2017-new foci of metastatic disease at the right supra-acetabulum, left acetabulum, low cervical spine, and L2  CT 04/18/2017-left iliac, periaortic, and retrocrural lymphadenopathy  Bone scan 12/05/2017-progressive bone metastases; new left hydronephrosis and proximal hydroureter.  CT abdomen/pelvis 12/30/2017-moderate left hydronephrosis with moderate dilatation of the left renal  pelvis and proximal left ureter without obstructing calculus. Stable large hiatal hernia. Stable sclerotic lesions in the pelvis and L3 vertebral body concerning for metastatic disease. No enlarged abdominal or pelvic lymph nodes.  Abiraterone/prednisone started 02/07/2018  PSA improved 02/26/2018, higher on 04/06/2018, improved 05/21/2018, higher 03/26/2019,higher 05/09/2019  CTs 05/14/2019-interval progression of sclerotic lesions in the bony pelvis and lumbar spine consistent with progression of bony metastatic disease. Decrease in the size of a medial right lower lobe pulmonary nodule. Decrease in size of left external iliac and retroperitoneal lymph nodes. Left internal ureteral stent with persistent mild left hydronephrosis. Large hiatal hernia or left hemidiaphragmatic defect, stable.  Zytiga discontinued 05/14/2019  Xtandi 06/02/2019, discontinued 06/10/2019 secondary to nausea/vomiting  CT abdomen/pelvis 07/29/2019-chronic left hydronephrosis, left ureter stent, large hiatal hernia, no adenopathy sclerotic bone metastases  08/01/2019 PSA 257 (06/28/2019 PSA 72.6)  Xtandi resumed 08/13/2019  Xtandi on hold due to GI symptoms  2.Hypertension  3.Recurrent hiatal hernia-status post gastrostomy tube placement 09/20/2017  4.Left hydronephrosis status post placement of double-J stent 01/04/2018.  Disposition: Jorge [name] appears unchanged.  He was unable to tolerate Xtandi due to GI symptoms.  This may be related to the large hiatal hernia.  He has an appointment with Dr. Hassell Done on 09/13/2018.  Jorge [name].  We will follow-up on the PSA from today.  He will return for a follow-up visit in approximately 4 weeks.  Patient seen with Dr. Benay Spice.    Ned Card ANP/GNP-BC   08/29/2019  11:51 AM This was a shared visit with Ned Card.  Jorge [name] was interviewed and examined.  We suspect his  GI symptoms are related to the large hiatal hernia.  He is scheduled  to see Dr. Hassell Done to consider surgical intervention.  Jorge [name] will remain on hold.  Julieanne Manson, MD

## 2019-08-30 LAB — PROSTATE-SPECIFIC AG, SERUM (LABCORP): Prostate Specific Ag, Serum: 90.1 ng/mL — ABNORMAL HIGH (ref 0.0–4.0)

## 2019-09-06 ENCOUNTER — Telehealth: Payer: Self-pay | Admitting: *Deleted

## 2019-09-06 NOTE — Telephone Encounter (Signed)
Called patient to f/u on referral to Dr. Barry Dienes re: hernia. He reports he has an appointment on 09/13/19.

## 2019-09-13 DIAGNOSIS — K449 Diaphragmatic hernia without obstruction or gangrene: Secondary | ICD-10-CM | POA: Diagnosis not present

## 2019-09-13 DIAGNOSIS — Z8719 Personal history of other diseases of the digestive system: Secondary | ICD-10-CM | POA: Diagnosis not present

## 2019-09-23 ENCOUNTER — Ambulatory Visit (HOSPITAL_COMMUNITY): Payer: Self-pay | Admitting: Surgery

## 2019-09-26 ENCOUNTER — Ambulatory Visit: Payer: Medicare Other | Admitting: Oncology

## 2019-09-26 ENCOUNTER — Other Ambulatory Visit (HOSPITAL_COMMUNITY)
Admission: RE | Admit: 2019-09-26 | Discharge: 2019-09-26 | Disposition: A | Discharge status: Home / Self Care | Payer: Medicare Other | Source: Ambulatory Visit | Attending: Surgery | Admitting: Surgery

## 2019-09-26 DIAGNOSIS — Z01812 Encounter for preprocedural laboratory examination: Secondary | ICD-10-CM | POA: Diagnosis not present

## 2019-09-26 DIAGNOSIS — Z20822 Contact with and (suspected) exposure to covid-19: Secondary | ICD-10-CM | POA: Diagnosis not present

## 2019-09-26 LAB — SARS CORONAVIRUS 2 (TAT 6-24 HRS): SARS Coronavirus 2: NEGATIVE

## 2019-09-30 ENCOUNTER — Ambulatory Visit (HOSPITAL_COMMUNITY)
Admission: RE | Admit: 2019-09-30 | Discharge: 2019-09-30 | Disposition: A | Discharge status: Home / Self Care | Payer: Medicare Other | Attending: Surgery | Admitting: Surgery

## 2019-09-30 ENCOUNTER — Ambulatory Visit (HOSPITAL_COMMUNITY): Payer: Medicare Other | Admitting: Certified Registered Nurse Anesthetist

## 2019-09-30 ENCOUNTER — Encounter (HOSPITAL_COMMUNITY)
Admission: RE | Disposition: A | Discharge status: Home / Self Care | Payer: Self-pay | Source: Home / Self Care | Attending: Surgery

## 2019-09-30 ENCOUNTER — Other Ambulatory Visit: Payer: Self-pay

## 2019-09-30 ENCOUNTER — Ambulatory Visit: Payer: Medicare Other | Admitting: Oncology

## 2019-09-30 DIAGNOSIS — Z8546 Personal history of malignant neoplasm of prostate: Secondary | ICD-10-CM | POA: Diagnosis not present

## 2019-09-30 DIAGNOSIS — K449 Diaphragmatic hernia without obstruction or gangrene: Secondary | ICD-10-CM | POA: Insufficient documentation

## 2019-09-30 DIAGNOSIS — C7951 Secondary malignant neoplasm of bone: Secondary | ICD-10-CM | POA: Diagnosis not present

## 2019-09-30 DIAGNOSIS — Z8711 Personal history of peptic ulcer disease: Secondary | ICD-10-CM | POA: Insufficient documentation

## 2019-09-30 DIAGNOSIS — M1990 Unspecified osteoarthritis, unspecified site: Secondary | ICD-10-CM | POA: Diagnosis not present

## 2019-09-30 DIAGNOSIS — Z9079 Acquired absence of other genital organ(s): Secondary | ICD-10-CM | POA: Insufficient documentation

## 2019-09-30 DIAGNOSIS — I10 Essential (primary) hypertension: Secondary | ICD-10-CM | POA: Diagnosis not present

## 2019-09-30 DIAGNOSIS — Z8249 Family history of ischemic heart disease and other diseases of the circulatory system: Secondary | ICD-10-CM | POA: Diagnosis not present

## 2019-09-30 DIAGNOSIS — Z79899 Other long term (current) drug therapy: Secondary | ICD-10-CM | POA: Insufficient documentation

## 2019-09-30 DIAGNOSIS — Z923 Personal history of irradiation: Secondary | ICD-10-CM | POA: Diagnosis not present

## 2019-09-30 DIAGNOSIS — Z8719 Personal history of other diseases of the digestive system: Secondary | ICD-10-CM | POA: Insufficient documentation

## 2019-09-30 DIAGNOSIS — K5900 Constipation, unspecified: Secondary | ICD-10-CM | POA: Insufficient documentation

## 2019-09-30 DIAGNOSIS — R1310 Dysphagia, unspecified: Secondary | ICD-10-CM | POA: Diagnosis not present

## 2019-09-30 DIAGNOSIS — Z85038 Personal history of other malignant neoplasm of large intestine: Secondary | ICD-10-CM | POA: Diagnosis not present

## 2019-09-30 DIAGNOSIS — E559 Vitamin D deficiency, unspecified: Secondary | ICD-10-CM | POA: Diagnosis not present

## 2019-09-30 DIAGNOSIS — D649 Anemia, unspecified: Secondary | ICD-10-CM | POA: Diagnosis not present

## 2019-09-30 HISTORY — PX: ESOPHAGOGASTRODUODENOSCOPY: SHX5428

## 2019-09-30 SURGERY — EGD (ESOPHAGOGASTRODUODENOSCOPY)
Anesthesia: Monitor Anesthesia Care

## 2019-09-30 MED ORDER — ONDANSETRON HCL 4 MG/2ML IJ SOLN
INTRAMUSCULAR | Status: DC | PRN
  Administered 2019-09-30: 4 mg via INTRAVENOUS

## 2019-09-30 MED ORDER — PROPOFOL 1000 MG/100ML IV EMUL
INTRAVENOUS | Status: AC
  Filled 2019-09-30: qty 200

## 2019-09-30 MED ORDER — PROPOFOL 10 MG/ML IV BOLUS
INTRAVENOUS | Status: DC | PRN
  Administered 2019-09-30 (×2): 10 mg via INTRAVENOUS

## 2019-09-30 MED ORDER — PROPOFOL 500 MG/50ML IV EMUL
INTRAVENOUS | Status: DC | PRN
  Administered 2019-09-30: 75 ug/kg/min via INTRAVENOUS

## 2019-09-30 MED ORDER — LACTATED RINGERS IV SOLN
INTRAVENOUS | Status: DC
  Administered 2019-09-30: 1000 mL via INTRAVENOUS

## 2019-09-30 MED ORDER — SODIUM CHLORIDE 0.9 % IV SOLN: INTRAVENOUS | Status: DC

## 2019-09-30 MED ORDER — LIDOCAINE 2% (20 MG/ML) 5 ML SYRINGE
INTRAMUSCULAR | Status: DC | PRN
  Administered 2019-09-30: 60 mg via INTRAVENOUS

## 2019-09-30 NOTE — Discharge Instructions (Addendum)
Upper Endoscopy, Adult, Care After This sheet gives you information about how to care for yourself after your procedure. Your health care provider may also give you more specific instructions. If you have problems or questions, contact your health care provider. What can I expect after the procedure? After the procedure, it is common to have:  A sore throat.  Mild stomach pain or discomfort.  Bloating.  Nausea. Follow these instructions at home:   Follow instructions from your health care provider about what to eat or drink after your procedure.  Return to your normal activities as told by your health care provider. Ask your health care provider what activities are safe for you.  Take over-the-counter and prescription medicines only as told by your health care provider.  Do not drive for 24 hours if you were given a sedative during your procedure.  Keep all follow-up visits as told by your health care provider. This is important. Contact a health care provider if you have:  A sore throat that lasts longer than one day.  Trouble swallowing. Get help right away if:  You vomit blood or your vomit looks like coffee grounds.  You have: ? A fever. ? Bloody, black, or tarry stools. ? A severe sore throat or you cannot swallow. ? Difficulty breathing. ? Severe pain in your chest or abdomen. Summary  After the procedure, it is common to have a sore throat, mild stomach discomfort, bloating, and nausea.  Do not drive for 24 hours if you were given a sedative during the procedure.  Follow instructions from your health care provider about what to eat or drink after your procedure.  Return to your normal activities as told by your health care provider. This information is not intended to replace advice given to you by your health care provider. Make sure you discuss any questions you have with your health care provider. Document Revised: 09/12/2017 Document Reviewed:  08/21/2017 Elsevier Patient Education  2020 [name] are going to do better drinking protein shakes multiple times a day.    You would do better if you could blenderize your vegetables and meats if you can.

## 2019-09-30 NOTE — H&P (Signed)
Chief Complaint:  Longstanding large hiatal hernia  History of Present Illness:  Jorge [name] is an 86 y.o. male with a longstanding hiatal hernia and dysphagia.  He is not an operative candidate and presents for endoscopy to see if his stomach obstructed.   Past Medical History:  Diagnosis Date  . Anemia   . Anxiety   . ED (erectile dysfunction)   . First degree heart block   . GERD (gastroesophageal reflux disease)    01-30-2019   per pt no issue since surgery 06/ 2019  . Hernia, hiatal GI-- dr p. hung   hx s/p [facility] repair 03/ 2013,  recurrent w/ gastric outlet obstruction 06/ 2019 s/p  open takedown/ gastrostomy tube;  last EGD 06-15-219 epic ,  large HH  . History of colonic polyps   . History of gastric ulcer 2008   w/ erosive esophagitis  . History of syncope    01/ 2011   per ED visit in epic ,  vasovagal epidose, negative work-up  . Hx of radiation therapy    prostate cancer ,  completed 03/ 2011  . Hydronephrosis of left kidney   . Hydronephrosis, left    due to malignant lymphadenopathy-- treated with ureteral stent  . Hypertension   . Insomnia   . Metastatic castration-resistant adenocarcinoma of prostate [facility]) urologist-- dr dahlstedt/  oncologist--- dr Benay Spice   incidental finding pt had suprapubic prostatectoy for BPH on  09-18-2006 found to have Gleason 7 prostate cancer;   completed radiation therapy 03/ 2011 for rising PSA;  recurrent w/ mets 06/ 2018  . Schatzki's ring   . Stricture of esophagus GI--- dr p. hung   (01-30-2019  per pt no issues since surgery 06/ 2019)   tortuous esophagus (noted in epic since 2008)  s/p  repair large [facility] 03/ 2013  recurrent w/ obstruction s/p takedown 06/ 2019  . Urinary incontinence   . Vitamin D deficiency   . Wears dentures    fuller upper and partial lower    Past Surgical History:  Procedure Laterality Date  . APPENDECTOMY  age 5  . CATARACT EXTRACTION W/ INTRAOCULAR LENS  IMPLANT, BILATERAL Bilateral yrs ago  . CYST  REMOVAL NECK N/A 10/16/2014   Procedure: EXCISION CYST POSTERIOR NECK;  Surgeon: Autumn Messing III, MD;  Location: [place];  Service: General;  Laterality: N/A;  posterior  . CYSTOSCOPY W/ RETROGRADES Left 01/04/2018   Procedure: CYSTOSCOPY LEFT STENT PLACEMENT;  Surgeon: Franchot Gallo, MD;  Location: [facility];  Service: Urology;  Laterality: Left;  . CYSTOSCOPY W/ URETERAL STENT PLACEMENT Left 01/31/2019   Procedure: CYSTOSCOPY WITH STENT REPLACEMENT;  Surgeon: Franchot Gallo, MD;  Location: [facility];  Service: Urology;  Laterality: Left;  . ESOPHAGOGASTRODUODENOSCOPY  06/05/2011   Procedure: ESOPHAGOGASTRODUODENOSCOPY (EGD);  Surgeon: Gatha Mayer, MD;  Location: [facility] END[facility]COPY;  Service: Endoscopy;  Laterality: N/A;  . ESOPHAGOGASTRODUODENOSCOPY N/A 09/16/2017   Procedure: ESOPHAGOGASTRODUODENOSCOPY (EGD);  Surgeon: Carol Ada, MD;  Location: [facility];  Service: Endoscopy;  Laterality: N/A;  . GASTROSTOMY N/A 09/20/2017   Procedure: INSERTION OF GASTROSTOMY TUBE;  Surgeon: Stark Klein, MD;  Location: [facility];  Service: General;  Laterality: N/A;  . HIATAL HERNIA REPAIR  06/10/2011   Procedure: LAPAROSCOPIC REPAIR OF HIATAL HERNIA;  Surgeon: Pedro Earls, MD;  Location: [place];  Service: General;  Laterality: N/A;  Laparoscopic repair of paraesophageal hernia.  . SUPRAPUBIC PROSTATECTOMY  09-18-2006   @[facility]     No  current facility-administered medications for this encounter.   Current Outpatient Medications  Medication Sig Dispense Refill  . acetaminophen (TYLENOL) 500 MG tablet Take 1,000 mg by mouth every 6 (six) hours as needed for mild pain or moderate pain.    [name] amLODipine (NORVASC) 5 MG tablet Take 1 tablet (5 mg total) by mouth daily. 90 tablet 3  . diphenhydramine-acetaminophen (TYLENOL PM) 25-500 MG TABS tablet Take 2 tablets by mouth at bedtime as needed (sleep).     . Leuprolide Acetate, 3 Month, (LUPRON DEPOT-PED, 83-MONTH, IM) Inject into  the muscle every 3 (three) months.    [name] losartan (COZAAR) 100 MG tablet Take 1 tablet (100 mg total) by mouth daily. 90 tablet 3  . polyethylene glycol powder (GLYCOLAX/MIRALAX) powder Take 17 g by mouth 2 (two) times daily as needed for mild constipation or moderate constipation. 500 g 3  . polyvinyl alcohol (LIQUIFILM TEARS) 1.4 % ophthalmic solution Place 1 drop into both eyes daily as needed for dry eyes.     . potassium chloride (KLOR-CON) 10 MEQ tablet Take 1 tablet (10 mEq total) by mouth daily. (Patient not taking: Reported on 09/25/2019) 90 tablet 3  . traMADol (ULTRAM) 50 MG tablet Take 1 tablet (50 mg total) by mouth every 12 (twelve) hours as needed. Do not drive while taking. (Patient not taking: Reported on 09/25/2019) 40 tablet 0   Aspirin, Azithromycin, Cefuroxime axetil, Iodinated diagnostic agents, Iodine, Other, Naproxen, and Penicillins Family History  Problem Relation Age of Onset  . Pancreatic cancer Father   . Hypertension Father   . Cancer Father   . Hypertension Other    Social History:   reports that he has never smoked. He has never used smokeless tobacco. He reports previous alcohol use. He reports that he does not use drugs.   REVIEW OF SYSTEMS : Negative except for see problem list  Physical Exam:   Height 6\' 1"  (1.854 m), weight 73.5 kg. Body mass index is 21.37 kg/m.  Gen:  WDWN WM NAD  Neurological: Alert and oriented to person, place, and time. Motor and sensory function is grossly intact  Head: Normocephalic and atraumatic.  Eyes: Conjunctivae are normal. Pupils are equal, round, and reactive to light. No scleral icterus.  Neck: Normal range of motion. Neck supple. No tracheal deviation or thyromegaly present.  Cardiovascular:  SR without murmurs or gallops.  No carotid bruits Breast:  Not examined Respiratory: Effort normal.  No respiratory distress. No chest wall tenderness. Breath sounds normal.  No wheezes, rales or rhonchi.  Abdomen:  nontender  with well healed scars GU:  Not examined Musculoskeletal: Normal range of motion. Extremities are nontender. No cyanosis, edema or clubbing noted Lymphadenopathy: No cervical, preauricular, postauricular or axillary adenopathy is present Skin: Skin is warm and dry. No rash noted. No diaphoresis. No erythema. No pallor. Pscyh: Normal mood and affect. Behavior is normal. Judgment and thought content normal.   LABORATORY RESULTS: No results found for this or any previous visit (from the past 48 hour(s)).   RADIOLOGY RESULTS: No results found.  Problem List: Patient Active Problem List   Diagnosis Date Noted  . Lesion of skin of nose 04/23/2019  . Hematuria, gross 04/09/2018  . Hypokalemia 04/09/2018  . Malnutrition ([place]) 04/09/2018  . Constipation 12/31/2017  . S/P prostatectomy 12/30/2017  . Prostate cancer metastatic to bone ([place]) 12/30/2017  . Hydronephrosis of left kidney 12/30/2017  . Small bowel obstruction ([place]) 09/16/2017  . Osteoarthritis 09/12/2017  . Hot flash due  to medication 04/05/2017  . Squamous cell skin cancer 11/15/2016  . Prostate cancer ([place]) 02/17/2016  . Elbow pain 10/21/2015  . Insomnia 08/18/2014  . Sebaceous cyst 08/18/2014  . Cerumen impaction 07/23/2013  . Eczema 08/23/2012  . Pain in joint, shoulder region 08/23/2012  . LBP (low back pain) 05/21/2012  . Actinic keratoses 10/26/2011  . Contact dermatitis 09/22/2011  . Large hiatal hernia 07/13/2011  . Nonspecific (abnormal) findings on radiological and other examination of gastrointestinal tract 06/05/2011  . LUQ abdominal pain 04/22/2011  . Chest pain, atypical 04/22/2011  . Irreducible hiatal hernia 04/22/2011  . Neoplasm of uncertain behavior of skin 11/10/2010  . HIP PAIN 04/28/2010  . Diarrhea 10/07/2008  . ERECTILE DYSFUNCTION 08/22/2008  . Weight loss 08/22/2008  . PERSONAL HX COLON CANCER 11/06/2007  . Vitamin D deficiency 04/19/2007  . Anxiety state 04/19/2007  . HTN (hypertension)  04/19/2007  . GERD 04/19/2007  . PEPTIC ULCER DISEASE 04/19/2007  . CERVICAL STRAIN 04/19/2007  . COLONIC POLYPS, HX OF 04/19/2007    Assessment & Plan: Complex diaphragmatic defect with hiatal hernia and intermittent obstruction.      Matt B. Hassell Done, MD, [facility] [phone] beeper [phone]  09/30/2019 8:13 AM

## 2019-09-30 NOTE — Anesthesia Preprocedure Evaluation (Addendum)
Anesthesia Evaluation  Patient identified by MRN, date of birth, ID band Patient awake    Reviewed: Allergy & Precautions, NPO status , Patient's Chart, lab work & pertinent test results  Airway Mallampati: II  TM Distance: >3 FB Neck ROM: Full    Dental no notable dental hx.    Pulmonary neg pulmonary ROS,    Pulmonary exam normal breath sounds clear to auscultation       Cardiovascular hypertension, Pt. on medications Normal cardiovascular exam Rhythm:Regular Rate:Normal  ECG: NSR, rate 79   Neuro/Psych negative neurological ROS  negative psych ROS   GI/Hepatic Neg liver ROS, hiatal hernia,   Endo/Other  negative endocrine ROS  Renal/GU negative Renal ROS  negative genitourinary   Musculoskeletal negative musculoskeletal ROS (+)   Abdominal   Peds negative pediatric ROS (+)  Hematology negative hematology ROS (+)   Anesthesia Other Findings HIATAL HERNIA WITH PUSS VOLVULUS  Reproductive/Obstetrics negative OB ROS                            Anesthesia Physical Anesthesia Plan  ASA: III  Anesthesia Plan: MAC   Post-op Pain Management:    Induction: Intravenous  PONV Risk Score and Plan: 0  Airway Management Planned: Simple Face Mask  Additional Equipment:   Intra-op Plan:   Post-operative Plan:   Informed Consent: I have reviewed the patients History and Physical, chart, labs and discussed the procedure including the risks, benefits and alternatives for the proposed anesthesia with the patient or authorized representative who has indicated his/her understanding and acceptance.     Dental advisory given  Plan Discussed with: CRNA and Surgeon  Anesthesia Plan Comments:         Anesthesia Quick Evaluation

## 2019-09-30 NOTE — Op Note (Signed)
Jorge [name]  05-29-1929   09/30/2019    PCP:  Cassandria Anger, MD   Surgeon: Kaylyn Lim, MD, FACS  Asst:  Alphonsa Overall, MD, FACS  Anes:  general  Preop Dx: Giant type III hiatal hernia Postop Dx: same  Procedure: EGD Location Surgery: [facility] endoscopy Complications: none  EBL:   0 cc  Drains: none  Description of Procedure:  The patient was taken to OR 1 at [place] .  After anesthesia (Propofol) was administered  and a timeout was performed.  The scope was placed and advanced into the proximal esophagus.  Very carefully it was advance to 30 cm where the lumen was more obscure.  The lumen was followed and the scope entered the fundus and this was evacuated with old liquid and foody/mucous.  I was then able to move the scope into the antrum and across the pylorus and into the duodenum.  No ulcers were seen.  As I withdrew the scope it appears that the fundus was above the diaphragm and that was the inflection point at 30 cm.  No mucosal lesions were seen and the dimple from the prior G tube was not apparent.   CO2 gas was evacuated as we withdrew and the scope was withdrawn.    The anatomy is not such that there is a reasonable alternative procedure to approach this either endoscopically or transabdomnally given his age and underlying comorbidities.    The patient tolerated the procedure well and was taken to the PACU in stable condition.     Matt B. Hassell Done, [place], [facility], [place]

## 2019-09-30 NOTE — Anesthesia Postprocedure Evaluation (Signed)
Anesthesia Post Note  Patient: Jorge [name]  Procedure(s) Performed: UPPER ENDOSCOPY (N/A )     Patient location during evaluation: PACU Anesthesia Type: MAC Level of consciousness: awake and alert Pain management: pain level controlled Vital Signs Assessment: post-procedure vital signs reviewed and stable Respiratory status: spontaneous breathing, nonlabored ventilation, respiratory function stable and patient connected to nasal cannula oxygen Cardiovascular status: stable and blood pressure returned to baseline Postop Assessment: no apparent nausea or vomiting Anesthetic complications: no   No complications documented.  Last Vitals:  Vitals:   09/30/19 1239 09/30/19 1240  BP: (!) 142/67 129/66  Pulse: 72 69  Resp: 12 (!) 23  Temp:  (!) 36.1 C  SpO2: 97% 96%    Last Pain:  Vitals:   09/30/19 1240  TempSrc: Oral  PainSc: 0-No pain                 Keiasha Diep S

## 2019-09-30 NOTE — Interval H&P Note (Signed)
History and Physical Interval Note:  09/30/2019 11:35 AM  Jorge [name]  has presented today for surgery, with the diagnosis of HIATAL HERNIA WITH PUSS VOLVULUS.  The various methods of treatment have been discussed with the patient and family. After consideration of risks, benefits and other options for treatment, the patient has consented to  Procedure(s): UPPER ENDOSCOPY (N/A) as a surgical intervention.  The patient's history has been reviewed, patient examined, no change in status, stable for surgery.  I have reviewed the patient's chart and labs.  Questions were answered to the patient's satisfaction.     Pedro Earls

## 2019-09-30 NOTE — Transfer of Care (Signed)
Immediate Anesthesia Transfer of Care Note  Patient: Jorge [name]  Procedure(s) Performed: UPPER ENDOSCOPY (N/A )  Patient Location: PACU  Anesthesia Type:MAC  Level of Consciousness: awake, alert  and oriented  Airway & Oxygen Therapy: Patient Spontanous Breathing and Patient connected to face mask oxygen  Post-op Assessment: Report given to RN and Post -op Vital signs reviewed and stable  Post vital signs: Reviewed and stable  Last Vitals:  Vitals Value Taken Time  BP 129/66 09/30/19 1240  Temp    Pulse 64 09/30/19 1245  Resp 21 09/30/19 1245  SpO2 100 % 09/30/19 1245  Vitals shown include unvalidated device data.  Last Pain:  Vitals:   09/30/19 1239  TempSrc:   PainSc: 0-No pain         Complications: No complications documented.

## 2019-10-01 ENCOUNTER — Encounter (HOSPITAL_COMMUNITY): Payer: Self-pay | Admitting: Surgery

## 2019-10-03 ENCOUNTER — Other Ambulatory Visit: Payer: Self-pay

## 2019-10-03 ENCOUNTER — Ambulatory Visit: Payer: Medicare Other | Admitting: Internal Medicine

## 2019-10-03 ENCOUNTER — Encounter: Payer: Self-pay | Admitting: Internal Medicine

## 2019-10-03 DIAGNOSIS — K449 Diaphragmatic hernia without obstruction or gangrene: Secondary | ICD-10-CM | POA: Diagnosis not present

## 2019-10-03 DIAGNOSIS — R531 Weakness: Secondary | ICD-10-CM

## 2019-10-03 DIAGNOSIS — I10 Essential (primary) hypertension: Secondary | ICD-10-CM | POA: Diagnosis not present

## 2019-10-03 DIAGNOSIS — C61 Malignant neoplasm of prostate: Secondary | ICD-10-CM | POA: Diagnosis not present

## 2019-10-03 DIAGNOSIS — C7951 Secondary malignant neoplasm of bone: Secondary | ICD-10-CM

## 2019-10-03 MED ORDER — LOSARTAN POTASSIUM 100 MG PO TABS: 50.0000 mg | ORAL_TABLET | Freq: Every day | ORAL | 3 refills | Status: DC

## 2019-10-03 MED ORDER — FAMOTIDINE 40 MG PO TABS: 40.0000 mg | ORAL_TABLET | Freq: Every day | ORAL | 3 refills | Status: AC

## 2019-10-03 NOTE — Patient Instructions (Signed)
   Reduce Losartan to 1/2 tab/day

## 2019-10-03 NOTE — Progress Notes (Signed)
Subjective:  Patient ID: Jorge [name], male    DOB: 1929/07/08  Age: 86 y.o. MRN: [id number]  CC: No chief complaint on file.   HPI BOBAK OGUINN presents for his recent dysphagia - had EGD by Dr Benson Norway 2 wks ago - 32 cm HH. F/u prostate cancer, HTN f/u C/o weakness, poor appetite  Outpatient Medications Prior to Visit  Medication Sig Dispense Refill  . acetaminophen (TYLENOL) 500 MG tablet Take 1,000 mg by mouth every 6 (six) hours as needed for mild pain or moderate pain.    [name] amLODipine (NORVASC) 5 MG tablet Take 1 tablet (5 mg total) by mouth daily. 90 tablet 3  . diphenhydramine-acetaminophen (TYLENOL PM) 25-500 MG TABS tablet Take 2 tablets by mouth at bedtime as needed (sleep).     . Leuprolide Acetate, 3 Month, (LUPRON DEPOT-PED, 72-MONTH, IM) Inject into the muscle every 3 (three) months.    [name] losartan (COZAAR) 100 MG tablet Take 1 tablet (100 mg total) by mouth daily. 90 tablet 3  . polyethylene glycol powder (GLYCOLAX/MIRALAX) powder Take 17 g by mouth 2 (two) times daily as needed for mild constipation or moderate constipation. 500 g 3  . polyvinyl alcohol (LIQUIFILM TEARS) 1.4 % ophthalmic solution Place 1 drop into both eyes daily as needed for dry eyes.     . potassium chloride (KLOR-CON) 10 MEQ tablet Take 1 tablet (10 mEq total) by mouth daily. 90 tablet 3  . traMADol (ULTRAM) 50 MG tablet Take 1 tablet (50 mg total) by mouth every 12 (twelve) hours as needed. Do not drive while taking. 40 tablet 0   No facility-administered medications prior to visit.    ROS: Review of Systems  Constitutional: Negative for appetite change, fatigue and unexpected weight change.  HENT: Negative for congestion, nosebleeds, sneezing, sore throat and trouble swallowing.   Eyes: Negative for itching and visual disturbance.  Respiratory: Negative for cough.   Cardiovascular: Negative for chest pain, palpitations and leg swelling.  Gastrointestinal: Negative for abdominal distention,  blood in stool, diarrhea and nausea.  Genitourinary: Negative for frequency and hematuria.  Musculoskeletal: Negative for back pain, gait problem, joint swelling and neck pain.  Skin: Negative for rash.  Neurological: Negative for dizziness, tremors, speech difficulty and weakness.  Psychiatric/Behavioral: Negative for agitation, dysphoric mood, sleep disturbance and suicidal ideas. The patient is not nervous/anxious.     Objective:  BP 128/74 (BP Location: Right Arm, Patient Position: Sitting, Cuff Size: Normal)   Pulse 100   Temp 98.4 F (36.9 C) (Oral)   Ht 6\' 1"  (1.854 m)   Wt 161 lb (73 kg)   SpO2 95%   BMI 21.24 kg/m   BP Readings from Last 3 Encounters:  10/03/19 128/74  09/30/19 (!) 142/67  08/29/19 (!) 143/77    Wt Readings from Last 3 Encounters:  10/03/19 161 lb (73 kg)  09/30/19 162 lb 0.6 oz (73.5 kg)  08/29/19 167 lb 9.6 oz (76 kg)    Physical Exam Constitutional:      General: He is not in acute distress.    Appearance: He is well-developed.     Comments: NAD  Eyes:     Conjunctiva/sclera: Conjunctivae normal.     Pupils: Pupils are equal, round, and reactive to light.  Neck:     Thyroid: No thyromegaly.     Vascular: No JVD.  Cardiovascular:     Rate and Rhythm: Normal rate and regular rhythm.     Heart sounds: Normal heart  sounds. No murmur heard.  No friction rub. No gallop.   Pulmonary:     Effort: Pulmonary effort is normal. No respiratory distress.     Breath sounds: Normal breath sounds. No wheezing or rales.  Chest:     Chest wall: No tenderness.  Abdominal:     General: Bowel sounds are normal. There is no distension.     Palpations: Abdomen is soft. There is no mass.     Tenderness: There is no abdominal tenderness. There is no guarding or rebound.  Musculoskeletal:        General: No tenderness. Normal range of motion.     Cervical back: Normal range of motion.  Lymphadenopathy:     Cervical: No cervical adenopathy.  Skin:     General: Skin is warm and dry.     Findings: No rash.  Neurological:     Mental Status: He is alert and oriented to person, place, and time.     Cranial Nerves: No cranial nerve deficit.     Motor: No abnormal muscle tone.     Coordination: Coordination normal.     Gait: Gait normal.     Deep Tendon Reflexes: Reflexes are normal and symmetric.  Psychiatric:        Behavior: Behavior normal.        Thought Content: Thought content normal.        Judgment: Judgment normal.     Lab Results  Component Value Date   WBC 8.0 08/29/2019   HGB 12.0 (L) 08/29/2019   HCT 37.9 (L) 08/29/2019   PLT 293 08/29/2019   GLUCOSE 111 (H) 08/29/2019   CHOL 164 10/21/2015   TRIG 110 05/21/2018   HDL 52.20 10/21/2015   LDLCALC 88 10/21/2015   ALT 13 08/29/2019   AST 27 08/29/2019   NA 138 08/29/2019   K 4.1 08/29/2019   CL 108 08/29/2019   CREATININE 0.98 08/29/2019   BUN 14 08/29/2019   CO2 23 08/29/2019   TSH 1.26 10/21/2015   PSA 0.46 01/10/2007   INR 0.9 04/09/2018   HGBA1C 6.1 (H) 01/10/2007    No results found.  Assessment & Plan:   There are no diagnoses linked to this encounter.   No orders of the defined types were placed in this encounter.    Follow-up: No follow-ups on file.  [name], MD

## 2019-10-03 NOTE — Assessment & Plan Note (Addendum)
Recent onset bad dysphagia - had EGD by Dr Benson Norway 2 wks ago - 32 cm HH. Pt can't swallow his prostate cancer pills: will reschedule w/dr Benay Spice to see if his prostate cancer therapy needs to be changed.  Start Pepcid

## 2019-10-03 NOTE — Assessment & Plan Note (Addendum)
Not eating Recent onset bad dysphagia - had EGD by Dr Benson Norway 2 wks ago - 32 cm HH. Pt can't swallow his prostate cancer pills: will reschedule w/dr Benay Spice to see if his prostate cancer therapy needs to be changed.  Reduce Losartan to 1/2 tab/day

## 2019-10-03 NOTE — Assessment & Plan Note (Signed)
Not eating Recent onset bad dysphagia - had EGD by Dr Benson Norway 2 wks ago - 32 cm HH. Pt can't swallow his prostate cancer pills: will reschedule w/dr Benay Spice to see if his prostate cancer therapy needs to be changed.  Reduce Losartan to 1/2 tab/day

## 2019-10-03 NOTE — Assessment & Plan Note (Signed)
Dr Hal Morales - unable to take.... 10/03/19 Recent onset bad dysphagia - had EGD by Dr Benson Norway 2 wks ago - 32 cm HH. Pt can't swallow his prostate cancer pills: will reschedule w/dr Benay Spice to see if his prostate cancer therapy needs to be changed.

## 2019-10-21 ENCOUNTER — Other Ambulatory Visit: Payer: Self-pay | Admitting: Internal Medicine

## 2019-10-21 ENCOUNTER — Other Ambulatory Visit: Payer: Self-pay

## 2019-10-21 ENCOUNTER — Encounter: Payer: Self-pay | Admitting: Internal Medicine

## 2019-10-21 ENCOUNTER — Ambulatory Visit: Payer: Medicare Other | Admitting: Internal Medicine

## 2019-10-21 DIAGNOSIS — K591 Functional diarrhea: Secondary | ICD-10-CM

## 2019-10-21 DIAGNOSIS — E559 Vitamin D deficiency, unspecified: Secondary | ICD-10-CM

## 2019-10-21 DIAGNOSIS — K219 Gastro-esophageal reflux disease without esophagitis: Secondary | ICD-10-CM

## 2019-10-21 DIAGNOSIS — K449 Diaphragmatic hernia without obstruction or gangrene: Secondary | ICD-10-CM

## 2019-10-21 DIAGNOSIS — G4700 Insomnia, unspecified: Secondary | ICD-10-CM

## 2019-10-21 DIAGNOSIS — I10 Essential (primary) hypertension: Secondary | ICD-10-CM

## 2019-10-21 DIAGNOSIS — C4492 Squamous cell carcinoma of skin, unspecified: Secondary | ICD-10-CM

## 2019-10-21 MED ORDER — LOPERAMIDE HCL 2 MG PO TABS: 2.0000 mg | ORAL_TABLET | Freq: Three times a day (TID) | ORAL | 1 refills | Status: AC | PRN

## 2019-10-21 MED ORDER — HYDROXYZINE HCL 25 MG PO TABS
25.0000 mg | ORAL_TABLET | Freq: Three times a day (TID) | ORAL | 5 refills | Status: DC | PRN
Start: 2019-10-21 — End: 2019-10-25

## 2019-10-21 NOTE — Assessment & Plan Note (Signed)
Vit D 

## 2019-10-21 NOTE — Assessment & Plan Note (Signed)
Per Dr Kaylyn Lim: "The anatomy is not such that there is a reasonable alternative procedure to approach this either endoscopically or transabdomnally given his age and underlying comorbidities.  " Better on Pepcid

## 2019-10-21 NOTE — Progress Notes (Signed)
Subjective:  Patient ID: Jorge [name], male    DOB: 1929-07-30  Age: 86 y.o. MRN: [id number]  CC: No chief complaint on file.   HPI Jorge [name] presents for GERD/HH and dysphagia to pills - better. C/o occ diarrhea    EGD: "I was then able to move the scope into the antrum and across the pylorus and into the duodenum.  No ulcers were seen.  As I withdrew the scope it appears that the fundus was above the diaphragm and that was the inflection point at 30 cm.  No mucosal lesions were seen and the dimple from the prior G tube was not apparent.   CO2 gas was evacuated as we withdrew and the scope was withdrawn.    The anatomy is not such that there is a reasonable alternative procedure to approach this either endoscopically or transabdomnally given his age and underlying comorbidities.    The patient tolerated the procedure well and was taken to the PACU in stable condition.     Matt B. Hassell Done, MD, [facility], [place] [phone]"   C/o diarrhea at times   Outpatient Medications Prior to Visit  Medication Sig Dispense Refill  . acetaminophen (TYLENOL) 500 MG tablet Take 1,000 mg by mouth every 6 (six) hours as needed for mild pain or moderate pain.    [name] amLODipine (NORVASC) 5 MG tablet Take 1 tablet (5 mg total) by mouth daily. 90 tablet 3  . diphenhydramine-acetaminophen (TYLENOL PM) 25-500 MG TABS tablet Take 2 tablets by mouth at bedtime as needed (sleep).     . famotidine (PEPCID) 40 MG tablet Take 1 tablet (40 mg total) by mouth daily. 90 tablet 3  . Leuprolide Acetate, 3 Month, (LUPRON DEPOT-PED, 59-MONTH, IM) Inject into the muscle every 3 (three) months.    [name] losartan (COZAAR) 100 MG tablet Take 0.5 tablets (50 mg total) by mouth daily. 90 tablet 3  . polyethylene glycol powder (GLYCOLAX/MIRALAX) powder Take 17 g by mouth 2 (two) times daily as needed for mild constipation or moderate constipation. 500 g 3  . polyvinyl alcohol (LIQUIFILM TEARS) 1.4  % ophthalmic solution Place 1 drop into both eyes daily as needed for dry eyes.     . potassium chloride (KLOR-CON) 10 MEQ tablet Take 1 tablet (10 mEq total) by mouth daily. 90 tablet 3  . traMADol (ULTRAM) 50 MG tablet Take 1 tablet (50 mg total) by mouth every 12 (twelve) hours as needed. Do not drive while taking. 40 tablet 0   No facility-administered medications prior to visit.    ROS: Review of Systems  Constitutional: Negative for appetite change, fatigue and unexpected weight change.  HENT: Negative for congestion, nosebleeds, sneezing, sore throat and trouble swallowing.   Eyes: Negative for itching and visual disturbance.  Respiratory: Negative for cough.   Cardiovascular: Negative for chest pain, palpitations and leg swelling.  Gastrointestinal: Negative for abdominal distention, blood in stool, diarrhea and nausea.  Genitourinary: Negative for frequency and hematuria.  Musculoskeletal: Negative for back pain, gait problem, joint swelling and neck pain.  Skin: Negative for rash.  Neurological: Negative for dizziness, tremors, speech difficulty and weakness.  Psychiatric/Behavioral: Negative for agitation, dysphoric mood and sleep disturbance. The patient is not nervous/anxious.     Objective:  BP (!) 180/94 (BP Location: Left Arm, Patient Position: Sitting, Cuff Size: Normal)   Pulse (!) 105   Temp 98.1 F (36.7 C) (Oral)   Ht 6\' 1"  (1.854 m)   Wt  158 lb (71.7 kg)   SpO2 97%   BMI 20.85 kg/m   BP Readings from Last 3 Encounters:  10/21/19 (!) 180/94  10/03/19 128/74  09/30/19 (!) 142/67    Wt Readings from Last 3 Encounters:  10/21/19 158 lb (71.7 kg)  10/03/19 161 lb (73 kg)  09/30/19 162 lb 0.6 oz (73.5 kg)    Physical Exam Constitutional:      General: He is not in acute distress.    Appearance: He is well-developed.     Comments: NAD  Eyes:     Conjunctiva/sclera: Conjunctivae normal.     Pupils: Pupils are equal, round, and reactive to light.    Neck:     Thyroid: No thyromegaly.     Vascular: No JVD.  Cardiovascular:     Rate and Rhythm: Normal rate and regular rhythm.     Heart sounds: Normal heart sounds. No murmur heard.  No friction rub. No gallop.   Pulmonary:     Effort: Pulmonary effort is normal. No respiratory distress.     Breath sounds: Normal breath sounds. No wheezing or rales.  Chest:     Chest wall: No tenderness.  Abdominal:     General: Bowel sounds are normal. There is no distension.     Palpations: Abdomen is soft. There is no mass.     Tenderness: There is no abdominal tenderness. There is no guarding or rebound.  Musculoskeletal:        General: No tenderness. Normal range of motion.     Cervical back: Normal range of motion.  Lymphadenopathy:     Cervical: No cervical adenopathy.  Skin:    General: Skin is warm and dry.     Findings: No rash.  Neurological:     Mental Status: He is alert and oriented to person, place, and time.     Cranial Nerves: No cranial nerve deficit.     Motor: No abnormal muscle tone.     Coordination: Coordination normal.     Gait: Gait normal.     Deep Tendon Reflexes: Reflexes are normal and symmetric.  Psychiatric:        Behavior: Behavior normal.        Thought Content: Thought content normal.        Judgment: Judgment normal.     Lab Results  Component Value Date   WBC 8.0 08/29/2019   HGB 12.0 (L) 08/29/2019   HCT 37.9 (L) 08/29/2019   PLT 293 08/29/2019   GLUCOSE 111 (H) 08/29/2019   CHOL 164 10/21/2015   TRIG 110 05/21/2018   HDL 52.20 10/21/2015   LDLCALC 88 10/21/2015   ALT 13 08/29/2019   AST 27 08/29/2019   NA 138 08/29/2019   K 4.1 08/29/2019   CL 108 08/29/2019   CREATININE 0.98 08/29/2019   BUN 14 08/29/2019   CO2 23 08/29/2019   TSH 1.26 10/21/2015   PSA 0.46 01/10/2007   INR 0.9 04/09/2018   HGBA1C 6.1 (H) 01/10/2007    No results found.  Assessment & Plan:   There are no diagnoses linked to this encounter.   No orders of  the defined types were placed in this encounter.    Follow-up: No follow-ups on file.  [name], MD

## 2019-10-21 NOTE — Assessment & Plan Note (Signed)
Imodium AD prn 

## 2019-10-21 NOTE — Assessment & Plan Note (Signed)
Try Hydroxyzine - low dose

## 2019-10-21 NOTE — Assessment & Plan Note (Signed)
Elevated BP He did not take meds this AM Take meds

## 2019-10-21 NOTE — Assessment & Plan Note (Signed)
Face - ref to Dr Ronnald Ramp

## 2019-10-21 NOTE — Patient Instructions (Signed)
Hiatal Hernia  A hiatal hernia occurs when part of the stomach slides above the muscle that separates the abdomen from the chest (diaphragm). A person can be born with a hiatal hernia (congenital), or it may develop over time. In almost all cases of hiatal hernia, only the top part of the stomach pushes through the diaphragm. Many people have a hiatal hernia with no symptoms. The larger the hernia, the more likely it is that you will have symptoms. In some cases, a hiatal hernia allows stomach acid to flow back into the tube that carries food from your mouth to your stomach (esophagus). This may cause heartburn symptoms. Severe heartburn symptoms may mean that you have developed a condition called gastroesophageal reflux disease (GERD). What are the causes? This condition is caused by a weakness in the opening (hiatus) where the esophagus passes through the diaphragm to attach to the upper part of the stomach. A person may be born with a weakness in the hiatus, or a weakness can develop over time. What increases the risk? This condition is more likely to develop in:  Older people. Age is a major risk factor for a hiatal hernia, especially if you are over the age of 50.  Pregnant women.  People who are overweight.  People who have frequent constipation. What are the signs or symptoms? Symptoms of this condition usually develop in the form of GERD symptoms. Symptoms include:  Heartburn.  Belching.  Indigestion.  Trouble swallowing.  Coughing or wheezing.  Sore throat.  Hoarseness.  Chest pain.  Nausea and vomiting. How is this diagnosed? This condition may be diagnosed during testing for GERD. Tests that may be done include:  X-rays of your stomach or chest.  An upper gastrointestinal (GI) series. This is an X-ray exam of your GI tract that is taken after you swallow a chalky liquid that shows up clearly on the X-ray.  Endoscopy. This is a procedure to look into your stomach  using a thin, flexible tube that has a tiny camera and light on the end of it. How is this treated? This condition may be treated by:  Dietary and lifestyle changes to help reduce GERD symptoms.  Medicines. These may include: ? Over-the-counter antacids. ? Medicines that make your stomach empty more quickly. ? Medicines that block the production of stomach acid (H2 blockers). ? Stronger medicines to reduce stomach acid (proton pump inhibitors).  Surgery to repair the hernia, if other treatments are not helping. If you have no symptoms, you may not need treatment. Follow these instructions at home: Lifestyle and activity  Do not use any products that contain nicotine or tobacco, such as cigarettes and e-cigarettes. If you need help quitting, ask your health care provider.  Try to achieve and maintain a healthy body weight.  Avoid putting pressure on your abdomen. Anything that puts pressure on your abdomen increases the amount of acid that may be pushed up into your esophagus. ? Avoid bending over, especially after eating. ? Raise the head of your bed by putting blocks under the legs. This keeps your head and esophagus higher than your stomach. ? Do not wear tight clothing around your chest or stomach. ? Try not to strain when having a bowel movement, when urinating, or when lifting heavy objects. Eating and drinking  Avoid foods that can worsen GERD symptoms. These may include: ? Fatty foods, like fried foods. ? Citrus fruits, like oranges or lemon. ? Other foods and drinks that contain acid, like   orange juice or tomatoes. ? Spicy food. ? Chocolate.  Eat frequent small meals instead of three large meals a day. This helps prevent your stomach from getting too full. ? Eat slowly. ? Do not lie down right after eating. ? Do not eat 1-2 hours before bed.  Do not drink beverages with caffeine. These include cola, coffee, cocoa, and tea.  Do not drink alcohol. General  instructions  Take over-the-counter and prescription medicines only as told by your health care provider.  Keep all follow-up visits as told by your health care provider. This is important. Contact a health care provider if:  Your symptoms are not controlled with medicines or lifestyle changes.  You are having trouble swallowing.  You have coughing or wheezing that will not go away. Get help right away if:  Your pain is getting worse.  Your pain spreads to your arms, neck, jaw, teeth, or back.  You have shortness of breath.  You sweat for no reason.  You feel sick to your stomach (nauseous) or you vomit.  You vomit blood.  You have bright red blood in your stools.  You have black, tarry stools. This information is not intended to replace advice given to you by your health care provider. Make sure you discuss any questions you have with your health care provider. Document Revised: 03/03/2017 Document Reviewed: 10/24/2016 Elsevier Patient Education  2020 [employer]  

## 2019-10-27 ENCOUNTER — Other Ambulatory Visit: Payer: Self-pay

## 2019-10-27 ENCOUNTER — Encounter (HOSPITAL_COMMUNITY): Payer: Self-pay | Admitting: *Deleted

## 2019-10-27 ENCOUNTER — Emergency Department (HOSPITAL_COMMUNITY): Payer: Medicare Other

## 2019-10-27 ENCOUNTER — Inpatient Hospital Stay (HOSPITAL_COMMUNITY)
Admission: EM | Admit: 2019-10-27 | Discharge: 2019-10-29 | DRG: 683 | Disposition: A | Discharge status: Home Health | Payer: Medicare Other | Attending: Family Medicine | Admitting: Family Medicine

## 2019-10-27 DIAGNOSIS — E860 Dehydration: Secondary | ICD-10-CM | POA: Diagnosis present

## 2019-10-27 DIAGNOSIS — E559 Vitamin D deficiency, unspecified: Secondary | ICD-10-CM | POA: Diagnosis not present

## 2019-10-27 DIAGNOSIS — R627 Adult failure to thrive: Secondary | ICD-10-CM | POA: Diagnosis not present

## 2019-10-27 DIAGNOSIS — Z923 Personal history of irradiation: Secondary | ICD-10-CM | POA: Diagnosis not present

## 2019-10-27 DIAGNOSIS — G459 Transient cerebral ischemic attack, unspecified: Secondary | ICD-10-CM | POA: Diagnosis not present

## 2019-10-27 DIAGNOSIS — G319 Degenerative disease of nervous system, unspecified: Secondary | ICD-10-CM | POA: Diagnosis not present

## 2019-10-27 DIAGNOSIS — J3489 Other specified disorders of nose and nasal sinuses: Secondary | ICD-10-CM | POA: Diagnosis not present

## 2019-10-27 DIAGNOSIS — R29898 Other symptoms and signs involving the musculoskeletal system: Secondary | ICD-10-CM | POA: Diagnosis not present

## 2019-10-27 DIAGNOSIS — N179 Acute kidney failure, unspecified: Secondary | ICD-10-CM | POA: Diagnosis not present

## 2019-10-27 DIAGNOSIS — Z888 Allergy status to other drugs, medicaments and biological substances status: Secondary | ICD-10-CM

## 2019-10-27 DIAGNOSIS — Y92009 Unspecified place in unspecified non-institutional (private) residence as the place of occurrence of the external cause: Secondary | ICD-10-CM | POA: Diagnosis not present

## 2019-10-27 DIAGNOSIS — Z880 Allergy status to penicillin: Secondary | ICD-10-CM

## 2019-10-27 DIAGNOSIS — Z91041 Radiographic dye allergy status: Secondary | ICD-10-CM

## 2019-10-27 DIAGNOSIS — R112 Nausea with vomiting, unspecified: Secondary | ICD-10-CM | POA: Diagnosis present

## 2019-10-27 DIAGNOSIS — K449 Diaphragmatic hernia without obstruction or gangrene: Secondary | ICD-10-CM | POA: Diagnosis not present

## 2019-10-27 DIAGNOSIS — R531 Weakness: Secondary | ICD-10-CM | POA: Diagnosis not present

## 2019-10-27 DIAGNOSIS — Z886 Allergy status to analgesic agent status: Secondary | ICD-10-CM | POA: Diagnosis not present

## 2019-10-27 DIAGNOSIS — N1330 Unspecified hydronephrosis: Secondary | ICD-10-CM | POA: Diagnosis present

## 2019-10-27 DIAGNOSIS — M25552 Pain in left hip: Secondary | ICD-10-CM | POA: Diagnosis present

## 2019-10-27 DIAGNOSIS — C7951 Secondary malignant neoplasm of bone: Secondary | ICD-10-CM | POA: Diagnosis not present

## 2019-10-27 DIAGNOSIS — K219 Gastro-esophageal reflux disease without esophagitis: Secondary | ICD-10-CM | POA: Diagnosis not present

## 2019-10-27 DIAGNOSIS — R634 Abnormal weight loss: Secondary | ICD-10-CM | POA: Diagnosis present

## 2019-10-27 DIAGNOSIS — Z9049 Acquired absence of other specified parts of digestive tract: Secondary | ICD-10-CM

## 2019-10-27 DIAGNOSIS — S0990XA Unspecified injury of head, initial encounter: Secondary | ICD-10-CM | POA: Diagnosis not present

## 2019-10-27 DIAGNOSIS — G893 Neoplasm related pain (acute) (chronic): Secondary | ICD-10-CM | POA: Diagnosis present

## 2019-10-27 DIAGNOSIS — R197 Diarrhea, unspecified: Secondary | ICD-10-CM | POA: Diagnosis present

## 2019-10-27 DIAGNOSIS — Z881 Allergy status to other antibiotic agents status: Secondary | ICD-10-CM

## 2019-10-27 DIAGNOSIS — Z9181 History of falling: Secondary | ICD-10-CM

## 2019-10-27 DIAGNOSIS — M810 Age-related osteoporosis without current pathological fracture: Secondary | ICD-10-CM | POA: Diagnosis not present

## 2019-10-27 DIAGNOSIS — C61 Malignant neoplasm of prostate: Secondary | ICD-10-CM | POA: Diagnosis present

## 2019-10-27 DIAGNOSIS — I10 Essential (primary) hypertension: Secondary | ICD-10-CM | POA: Diagnosis not present

## 2019-10-27 DIAGNOSIS — Z8249 Family history of ischemic heart disease and other diseases of the circulatory system: Secondary | ICD-10-CM | POA: Diagnosis not present

## 2019-10-27 DIAGNOSIS — S79911A Unspecified injury of right hip, initial encounter: Secondary | ICD-10-CM | POA: Diagnosis not present

## 2019-10-27 DIAGNOSIS — Z6820 Body mass index (BMI) 20.0-20.9, adult: Secondary | ICD-10-CM | POA: Diagnosis not present

## 2019-10-27 DIAGNOSIS — Z79899 Other long term (current) drug therapy: Secondary | ICD-10-CM | POA: Diagnosis not present

## 2019-10-27 DIAGNOSIS — Z20822 Contact with and (suspected) exposure to covid-19: Secondary | ICD-10-CM | POA: Diagnosis present

## 2019-10-27 DIAGNOSIS — R5381 Other malaise: Secondary | ICD-10-CM | POA: Diagnosis not present

## 2019-10-27 DIAGNOSIS — Z800 Family history of malignant neoplasm of digestive organs: Secondary | ICD-10-CM

## 2019-10-27 DIAGNOSIS — I6782 Cerebral ischemia: Secondary | ICD-10-CM | POA: Diagnosis not present

## 2019-10-27 DIAGNOSIS — W19XXXA Unspecified fall, initial encounter: Secondary | ICD-10-CM | POA: Diagnosis not present

## 2019-10-27 LAB — CBC
HCT: 35.7 % — ABNORMAL LOW (ref 39.0–52.0)
Hemoglobin: 11 g/dL — ABNORMAL LOW (ref 13.0–17.0)
MCH: 30.1 pg (ref 26.0–34.0)
MCHC: 30.8 g/dL (ref 30.0–36.0)
MCV: 97.8 fL (ref 80.0–100.0)
Platelets: 402 10*3/uL — ABNORMAL HIGH (ref 150–400)
RBC: 3.65 MIL/uL — ABNORMAL LOW (ref 4.22–5.81)
RDW: 15.8 % — ABNORMAL HIGH (ref 11.5–15.5)
WBC: 9.8 10*3/uL (ref 4.0–10.5)
nRBC: 0 % (ref 0.0–0.2)

## 2019-10-27 LAB — BASIC METABOLIC PANEL
Anion gap: 11 (ref 5–15)
BUN: 27 mg/dL — ABNORMAL HIGH (ref 8–23)
CO2: 25 mmol/L (ref 22–32)
Calcium: 8.9 mg/dL (ref 8.9–10.3)
Chloride: 108 mmol/L (ref 98–111)
Creatinine, Ser: 1.72 mg/dL — ABNORMAL HIGH (ref 0.61–1.24)
GFR calc Af Amer: 41 mL/min — ABNORMAL LOW (ref >60)
GFR calc non Af Amer: 35 mL/min — ABNORMAL LOW (ref >60)
Glucose, Bld: 118 mg/dL — ABNORMAL HIGH (ref 70–99)
Potassium: 4.5 mmol/L (ref 3.5–5.1)
Sodium: 144 mmol/L (ref 135–145)

## 2019-10-27 LAB — CBG MONITORING, ED: Glucose-Capillary: 106 mg/dL — ABNORMAL HIGH (ref 70–99)

## 2019-10-27 MED ORDER — SODIUM CHLORIDE 0.9% FLUSH: 3.0000 mL | Freq: Once | INTRAVENOUS | Status: DC

## 2019-10-27 MED ORDER — LACTATED RINGERS IV BOLUS
1000.0000 mL | Freq: Once | INTRAVENOUS | Status: AC
  Administered 2019-10-27: 1000 mL via INTRAVENOUS

## 2019-10-27 NOTE — ED Triage Notes (Signed)
Pt states he got up and fell backwards this morning, pt says he hit his head and his right hip, denies LOC. Reporting decreased appetite.

## 2019-10-27 NOTE — ED Provider Notes (Signed)
[place] DEPT Provider Note   CSN: [id number] Arrival date & time: 10/27/19  2107     History No chief complaint on file.   Jorge [name] is a 86 y.o. male.  HPI   86 year old male with leg weakness.  He woke up this morning in his usual state of health.  Later this afternoon he feels like his legs gave out and he fell backwards.  He fell onto a carpeted surface.  He did strike his head but denies any loss of consciousness or headache.  He is able to get up into a seated position but he was unable to stand up because of weakness in his legs.  He has a past history of metastatic cancer including mets to the bone in his left leg.  He states he has some chronic pain and weakness there but this weakness in his right leg is new.  Denies any numbness or tingling.  No lower back pain.  No changes in speech or vision.  No arm weakness.  Past Medical History:  Diagnosis Date  . Anemia   . Anxiety   . ED (erectile dysfunction)   . First degree heart block   . GERD (gastroesophageal reflux disease)    01-30-2019   per pt no issue since surgery 06/ 2019  . Hernia, hiatal GI-- dr p. hung   hx s/p [facility] repair 03/ 2013,  recurrent w/ gastric outlet obstruction 06/ 2019 s/p  open takedown/ gastrostomy tube;  last EGD 06-15-219 epic ,  large HH  . History of colonic polyps   . History of gastric ulcer 2008   w/ erosive esophagitis  . History of syncope    01/ 2011   per ED visit in epic ,  vasovagal epidose, negative work-up  . Hx of radiation therapy    prostate cancer ,  completed 03/ 2011  . Hydronephrosis of left kidney   . Hydronephrosis, left    due to malignant lymphadenopathy-- treated with ureteral stent  . Hypertension   . Insomnia   . Metastatic castration-resistant adenocarcinoma of prostate [facility]) urologist-- dr dahlstedt/  oncologist--- dr Benay Spice   incidental finding pt had suprapubic prostatectoy for BPH on  09-18-2006 found to have Gleason 7  prostate cancer;   completed radiation therapy 03/ 2011 for rising PSA;  recurrent w/ mets 06/ 2018  . Schatzki's ring   . Stricture of esophagus GI--- dr p. hung   (01-30-2019  per pt no issues since surgery 06/ 2019)   tortuous esophagus (noted in epic since 2008)  s/p  repair large [facility] 03/ 2013  recurrent w/ obstruction s/p takedown 06/ 2019  . Urinary incontinence   . Vitamin D deficiency   . Wears dentures    fuller upper and partial lower    Patient Active Problem List   Diagnosis Date Noted  . Weakness 10/03/2019  . Lesion of skin of nose 04/23/2019  . Hematuria, gross 04/09/2018  . Hypokalemia 04/09/2018  . Malnutrition ([place]) 04/09/2018  . Constipation 12/31/2017  . S/P prostatectomy 12/30/2017  . Prostate cancer metastatic to bone ([place]) 12/30/2017  . Hydronephrosis of left kidney 12/30/2017  . Small bowel obstruction ([place]) 09/16/2017  . Osteoarthritis 09/12/2017  . Hot flash due to medication 04/05/2017  . Squamous cell skin cancer 11/15/2016  . Elbow pain 10/21/2015  . Insomnia 08/18/2014  . Sebaceous cyst 08/18/2014  . Cerumen impaction 07/23/2013  . Eczema 08/23/2012  . Pain in joint, shoulder region 08/23/2012  .  LBP (low back pain) 05/21/2012  . Actinic keratoses 10/26/2011  . Contact dermatitis 09/22/2011  . Large hiatal hernia 07/13/2011  . Nonspecific (abnormal) findings on radiological and other examination of gastrointestinal tract 06/05/2011  . LUQ abdominal pain 04/22/2011  . Chest pain, atypical 04/22/2011  . Irreducible hiatal hernia 04/22/2011  . Neoplasm of uncertain behavior of skin 11/10/2010  . HIP PAIN 04/28/2010  . Diarrhea 10/07/2008  . ERECTILE DYSFUNCTION 08/22/2008  . Weight loss 08/22/2008  . PERSONAL HX COLON CANCER 11/06/2007  . Vitamin D deficiency 04/19/2007  . Anxiety state 04/19/2007  . HTN (hypertension) 04/19/2007  . GERD 04/19/2007  . PEPTIC ULCER DISEASE 04/19/2007  . CERVICAL STRAIN 04/19/2007  . COLONIC POLYPS, HX OF  04/19/2007    Past Surgical History:  Procedure Laterality Date  . APPENDECTOMY  age 50  . CATARACT EXTRACTION W/ INTRAOCULAR LENS  IMPLANT, BILATERAL Bilateral yrs ago  . CYST REMOVAL NECK N/A 10/16/2014   Procedure: EXCISION CYST POSTERIOR NECK;  Surgeon: Autumn Messing III, MD;  Location: [place];  Service: General;  Laterality: N/A;  posterior  . CYSTOSCOPY W/ RETROGRADES Left 01/04/2018   Procedure: CYSTOSCOPY LEFT STENT PLACEMENT;  Surgeon: Franchot Gallo, MD;  Location: [facility];  Service: Urology;  Laterality: Left;  . CYSTOSCOPY W/ URETERAL STENT PLACEMENT Left 01/31/2019   Procedure: CYSTOSCOPY WITH STENT REPLACEMENT;  Surgeon: Franchot Gallo, MD;  Location: [facility];  Service: Urology;  Laterality: Left;  . ESOPHAGOGASTRODUODENOSCOPY  06/05/2011   Procedure: ESOPHAGOGASTRODUODENOSCOPY (EGD);  Surgeon: Gatha Mayer, MD;  Location: [facility];  Service: Endoscopy;  Laterality: N/A;  . ESOPHAGOGASTRODUODENOSCOPY N/A 09/16/2017   Procedure: ESOPHAGOGASTRODUODENOSCOPY (EGD);  Surgeon: Carol Ada, MD;  Location: [facility];  Service: Endoscopy;  Laterality: N/A;  . ESOPHAGOGASTRODUODENOSCOPY N/A 09/30/2019   Procedure: UPPER ENDOSCOPY;  Surgeon: Johnathan Hausen, MD;  Location: [facility];  Service: General;  Laterality: N/A;  . GASTROSTOMY N/A 09/20/2017   Procedure: INSERTION OF GASTROSTOMY TUBE;  Surgeon: Stark Klein, MD;  Location: [facility];  Service: General;  Laterality: N/A;  . HIATAL HERNIA REPAIR  06/10/2011   Procedure: LAPAROSCOPIC REPAIR OF HIATAL HERNIA;  Surgeon: Pedro Earls, MD;  Location: [place];  Service: General;  Laterality: N/A;  Laparoscopic repair of paraesophageal hernia.  . SUPRAPUBIC PROSTATECTOMY  09-18-2006   @[facility]        Family History  Problem Relation Age of Onset  . Pancreatic cancer Father   . Hypertension Father   . Cancer Father   . Hypertension Other     Social History   Tobacco Use  . Smoking  status: Never Smoker  . Smokeless tobacco: Never Used  Vaping Use  . Vaping Use: Never used  Substance Use Topics  . Alcohol use: Not Currently  . Drug use: No    Home Medications Prior to Admission medications   Medication Sig Start Date End Date Taking? Authorizing Provider  acetaminophen (TYLENOL) 500 MG tablet Take 1,000 mg by mouth every 6 (six) hours as needed for mild pain or moderate pain.    [provider]  amLODipine (NORVASC) 5 MG tablet Take 1 tablet (5 mg total) by mouth daily. 07/22/19 07/21/20  [name], [name], MD  diphenhydramine-acetaminophen (TYLENOL PM) 25-500 MG TABS tablet Take 2 tablets by mouth at bedtime as needed (sleep).     [provider]  famotidine (PEPCID) 40 MG tablet Take 1 tablet (40 mg total) by mouth daily. 10/03/19   [name], [name], MD  hydrOXYzine (VISTARIL) 25 MG capsule TAKE 1 CAPSULE BY MOUTH EVERY 8 HOURS AS NEEDED FOR INSOMNIA 10/25/19   [name], [name], MD  Leuprolide Acetate, 3 Month, (LUPRON DEPOT-PED, 65-MONTH, IM) Inject into the muscle every 3 (three) months.    [provider]  loperamide (IMODIUM A-D) 2 MG tablet Take 1-2 tablets (2-4 mg total) by mouth 3 (three) times daily as needed for diarrhea or loose stools. 10/21/19   [name], [name], MD  losartan (COZAAR) 100 MG tablet Take 0.5 tablets (50 mg total) by mouth daily. 10/03/19   [name], [name], MD  polyethylene glycol powder (GLYCOLAX/MIRALAX) powder Take 17 g by mouth 2 (two) times daily as needed for mild constipation or moderate constipation. 01/10/18   [name], [name], MD  polyvinyl alcohol (LIQUIFILM TEARS) 1.4 % ophthalmic solution Place 1 drop into both eyes daily as needed for dry eyes.     [provider]  potassium chloride (KLOR-CON) 10 MEQ tablet Take 1 tablet (10 mEq total) by mouth daily. 07/22/19   [name], [name], MD    Allergies    Aspirin, Azithromycin, Cefuroxime axetil, Iodinated diagnostic agents,  Iodine, Other, Naproxen, and Penicillins  Review of Systems   Review of Systems All systems reviewed and negative, other than as noted in HPI.  Physical Exam Updated Vital Signs BP (!) 148/81 (BP Location: Left Arm)   Pulse 103   Temp 98.9 F (37.2 C) (Oral)   Resp 22   Ht 6\' 1"  (1.854 m)   Wt 71.7 kg   SpO2 96%   BMI 20.85 kg/m   Physical Exam Vitals and nursing note reviewed.  Constitutional:      General: He is not in acute distress.    Appearance: He is well-developed.  HENT:     Head: Normocephalic and atraumatic.  Eyes:     General:        Right eye: No discharge.        Left eye: No discharge.     Conjunctiva/sclera: Conjunctivae normal.  Cardiovascular:     Rate and Rhythm: Normal rate and regular rhythm.     Heart sounds: Normal heart sounds. No murmur heard.  No friction rub. No gallop.   Pulmonary:     Effort: Pulmonary effort is normal. No respiratory distress.     Breath sounds: Normal breath sounds.  Abdominal:     General: There is no distension.     Palpations: Abdomen is soft.     Tenderness: There is no abdominal tenderness.  Musculoskeletal:        General: No tenderness.     Cervical back: Neck supple.  Skin:    General: Skin is warm and dry.  Neurological:     Mental Status: He is alert and oriented to person, place, and time.     Cranial Nerves: No cranial nerve deficit.     Sensory: No sensory deficit.     Comments: Strength 4 out of 5 bilateral lower extremities.  5 out of 5 upper extremities.  Cranial nerves II through XII intact.  Psychiatric:        Behavior: Behavior normal.        Thought Content: Thought content normal.     ED Results / Procedures / Treatments   Labs (all labs ordered are listed, but only abnormal results are displayed) Labs Reviewed  BASIC METABOLIC PANEL - Abnormal; Notable for the following components:      Result Value   Glucose, Bld  118 (*)    BUN 27 (*)    Creatinine, Ser 1.72 (*)    GFR calc non  Af Amer 35 (*)    GFR calc Af Amer 41 (*)    All other components within normal limits  CBC - Abnormal; Notable for the following components:   RBC 3.65 (*)    Hemoglobin 11.0 (*)    HCT 35.7 (*)    RDW 15.8 (*)    Platelets 402 (*)    All other components within normal limits  LIPID PANEL - Abnormal; Notable for the following components:   HDL 33 (*)    All other components within normal limits  BASIC METABOLIC PANEL - Abnormal; Notable for the following components:   Glucose, Bld 131 (*)    Creatinine, Ser 1.63 (*)    Calcium 8.7 (*)    GFR calc non Af Amer 37 (*)    GFR calc Af Amer 43 (*)    All other components within normal limits  CBC - Abnormal; Notable for the following components:   RBC 3.24 (*)    Hemoglobin 9.8 (*)    HCT 31.9 (*)    RDW 15.6 (*)    All other components within normal limits  HEMOGLOBIN A1C - Abnormal; Notable for the following components:   Hgb A1c MFr Bld 5.9 (*)    All other components within normal limits  CBG MONITORING, ED - Abnormal; Notable for the following components:   Glucose-Capillary 106 (*)    All other components within normal limits  SARS CORONAVIRUS 2 BY RT PCR (HOSPITAL ORDER, [facility])  TSH  BASIC METABOLIC PANEL    EKG EKG Interpretation  Date/Time:  Sunday October 27 2019 21:26:28 EDT Ventricular Rate:  101 PR Interval:    QRS Duration: 95 QT Interval:  359 QTC Calculation: 466 R Axis:   62 Text Interpretation: Sinus tachycardia Multiple ventricular premature complexes Borderline prolonged PR interval Abnormal R-wave progression, early transition Confirmed by Virgel Manifold [id number]) on 10/27/2019 10:11:11 PM   Radiology CT Head Wo Contrast  Result Date: 10/27/2019 CLINICAL DATA:  Recent fall with headaches facial pain, initial encounter EXAM: CT HEAD WITHOUT CONTRAST TECHNIQUE: Contiguous axial images were obtained from the base of the skull through the vertex without intravenous contrast.  COMPARISON:  None. FINDINGS: Brain: Chronic atrophic and ischemic changes are noted. No findings to suggest acute hemorrhage, acute infarction or space-occupying mass lesion are noted. Vascular: No hyperdense vessel or unexpected calcification. Skull: Normal. Negative for fracture or focal lesion. Sinuses/Orbits: No acute finding. Other: None. IMPRESSION: Chronic atrophic and ischemic changes without acute abnormality. Electronically Signed   By: Inez Catalina M.D.   On: 10/27/2019 23:00   MR BRAIN WO CONTRAST  Result Date: 10/28/2019 CLINICAL DATA:  Transient ischemic attack (TIA). Additional history provided: Fall, significant weakness in right leg, difficulty standing EXAM: MRI HEAD WITHOUT CONTRAST TECHNIQUE: Multiplanar, multiecho pulse sequences of the brain and surrounding structures were obtained without intravenous contrast. COMPARISON:  Head CT 10/27/2019. FINDINGS: Brain: Mild intermittent motion degradation. This includes mild motion degradation of the axial diffusion-weighted sequence. Moderate generalized parenchymal atrophy. There is an apparent 3 mm subtle focus of restricted diffusion within the paramedian right pons (series 9, image 77). This finding is only appreciated on the axial diffusion-weighted imaging and not appreciated on the coronal diffusion-weighted sequence. There is no evidence of restricted diffusion elsewhere within the brain. Mild patchy T2/FLAIR hyperintensity within the cerebral white matter  is nonspecific, but consistent with chronic small vessel ischemic disease. No evidence of intracranial mass. No chronic intracranial blood products. No extra-axial fluid collection. No midline shift. Vascular: Expected proximal arterial flow voids. Skull and upper cervical spine: No focal marrow lesion. C1-C2 ligamentous hypertrophy/pannus formation. Sinuses/Orbits: Visualized orbits show no acute finding. Mild ethmoid sinus mucosal thickening. Trace fluid within left mastoid air cells.  IMPRESSION: Mildly motion degraded examination. Apparent 3 mm focus of restricted diffusion within the paramedian right pons. This finding is appreciated on the axial diffusion-weighted sequence only, and not appreciated on the coronal diffusion-weighted imaging. While this finding may reflect artifact, a small acute/early subacute infarct cannot be excluded at this site. Moderate generalized parenchymal atrophy with mild chronic small vessel ischemic disease. Mild ethmoid sinus mucosal thickening. Trace left mastoid effusion. Electronically Signed   By: Kellie Simmering DO   On: 10/28/2019 07:44   ECHOCARDIOGRAM COMPLETE  Result Date: 10/28/2019    ECHOCARDIOGRAM REPORT   Patient Name:   Jorge [name] Date of Exam: 10/28/2019 Medical Rec #:  [id number]        Height:       73.0 in Accession #:    [id number]       Weight:       158.0 lb Date of Birth:  06/12/1929        BSA:          1.947 m Patient Age:    65 years         BP:           172/94 mmHg Patient Gender: M                HR:           97 bpm. Exam Location:  Inpatient Procedure: 2D Echo, Cardiac Doppler and Color Doppler Indications:    TIA  History:        Patient has prior history of Echocardiogram examinations, most                 recent 06/06/2011. TIA, Signs/Symptoms:Chest Pain; Risk                 Factors:Hypertension. Metastatic cancer.  Sonographer:    Roseanna Rainbow RDCS Referring Phys: [id number] [address]ley Dakins PRATT  Sonographer Comments: Technically difficult study due to poor echo windows. Extremely difficult windows and thin body habitus. IMPRESSIONS  1. Left ventricular ejection fraction, by estimation, is 60 to 65%. The left ventricle has normal function. The left ventricle has no regional wall motion abnormalities. Left ventricular diastolic parameters are consistent with Grade I diastolic dysfunction (impaired relaxation).  2. Right ventricular systolic function is normal. The right ventricular size is normal. Tricuspid regurgitation signal is inadequate  for assessing PA pressure.  3. The mitral valve is grossly normal. No evidence of mitral valve regurgitation. No evidence of mitral stenosis.  4. The aortic valve is tricuspid. Aortic valve regurgitation is not visualized. Mild to moderate aortic valve sclerosis/calcification is present, without any evidence of aortic stenosis.  5. The inferior vena cava is normal in size with greater than 50% respiratory variability, suggesting right atrial pressure of 3 mmHg. Conclusion(s)/Recommendation(s): No intracardiac source of embolism detected on this transthoracic study. A transesophageal echocardiogram is recommended to exclude cardiac source of embolism if clinically indicated. FINDINGS  Left Ventricle: Left ventricular ejection fraction, by estimation, is 60 to 65%. The left ventricle has normal function. The left ventricle has no regional wall motion abnormalities. The left  ventricular internal cavity size was normal in size. There is  no left ventricular hypertrophy. Left ventricular diastolic parameters are consistent with Grade I diastolic dysfunction (impaired relaxation). Right Ventricle: The right ventricular size is normal. No increase in right ventricular wall thickness. Right ventricular systolic function is normal. Tricuspid regurgitation signal is inadequate for assessing PA pressure. Left Atrium: Left atrial size was normal in size. Right Atrium: Right atrial size was normal in size. Pericardium: A small pericardial effusion is present. The pericardial effusion is surrounding the apex. Presence of pericardial fat pad. Mitral Valve: The mitral valve is grossly normal. Mild mitral annular calcification. No evidence of mitral valve regurgitation. No evidence of mitral valve stenosis. MV peak gradient, 10.4 mmHg. The mean mitral valve gradient is 2.0 mmHg. Tricuspid Valve: The tricuspid valve is grossly normal. Tricuspid valve regurgitation is trivial. No evidence of tricuspid stenosis. Aortic Valve: The  aortic valve is tricuspid. . There is mild thickening and mild calcification of the aortic valve. Aortic valve regurgitation is not visualized. Mild to moderate aortic valve sclerosis/calcification is present, without any evidence of aortic stenosis. There is mild thickening of the aortic valve. There is mild calcification of the aortic valve. Aortic valve mean gradient measures 5.0 mmHg. Aortic valve peak gradient measures 10.4 mmHg. Aortic valve area, by VTI measures 2.97 cm. Pulmonic Valve: The pulmonic valve was grossly normal. Pulmonic valve regurgitation is not visualized. No evidence of pulmonic stenosis. Aorta: The aortic root and ascending aorta are structurally normal, with no evidence of dilitation. Venous: The inferior vena cava is normal in size with greater than 50% respiratory variability, suggesting right atrial pressure of 3 mmHg. IAS/Shunts: The atrial septum is grossly normal.  LEFT VENTRICLE PLAX 2D LVIDd:         3.54 cm     Diastology LVIDs:         2.27 cm     LV e' lateral:   7.51 cm/s LV PW:         1.71 cm     LV E/e' lateral: 11.5 LV IVS:        1.10 cm     LV e' medial:    7.94 cm/s LVOT diam:     2.20 cm     LV E/e' medial:  10.9 LV SV:         80 LV SV Index:   41 LVOT Area:     3.80 cm  LV Volumes (MOD) LV vol d, MOD A2C: 88.7 ml LV vol d, MOD A4C: 79.6 ml LV vol s, MOD A2C: 29.8 ml LV vol s, MOD A4C: 33.7 ml LV SV MOD A2C:     59.0 ml LV SV MOD A4C:     79.6 ml LV SV MOD BP:      53.3 ml RIGHT VENTRICLE             IVC RV S prime:     12.90 cm/s  IVC diam: 1.55 cm TAPSE (M-mode): 1.3 cm LEFT ATRIUM             Index       RIGHT ATRIUM           Index LA diam:        2.70 cm 1.39 cm/m  RA Area:     13.50 cm LA Vol (A2C):   55.1 ml 28.31 ml/m RA Volume:   30.40 ml  15.62 ml/m LA Vol (A4C):   38.0 ml 19.52 ml/m LA Biplane  Vol: 45.6 ml 23.43 ml/m  AORTIC VALVE AV Area (Vmax):    2.86 cm AV Area (Vmean):   2.87 cm AV Area (VTI):     2.97 cm AV Vmax:           161.00 cm/s AV  Vmean:          107.000 cm/s AV VTI:            0.269 m AV Peak Grad:      10.4 mmHg AV Mean Grad:      5.0 mmHg LVOT Vmax:         121.00 cm/s LVOT Vmean:        80.900 cm/s LVOT VTI:          0.210 m LVOT/AV VTI ratio: 0.78  AORTA Ao Root diam: 3.50 cm Ao Asc diam:  3.10 cm MITRAL VALVE MV Area (PHT): 3.53 cm     SHUNTS MV Peak grad:  10.4 mmHg    Systemic VTI:  0.21 m MV Mean grad:  2.0 mmHg     Systemic Diam: 2.20 cm MV Vmax:       1.61 m/s MV Vmean:      63.9 cm/s MV Decel Time: 215 msec MV E velocity: 86.50 cm/s MV A velocity: 135.00 cm/s MV E/A ratio:  0.64 Eleonore Chiquito MD Electronically signed by Eleonore Chiquito MD Signature Date/Time: 10/28/2019/4:14:41 PM    Final    DG HIP UNILAT WITH PELVIS 2-3 VIEWS RIGHT  Result Date: 10/28/2019 CLINICAL DATA:  Fall.  Metastatic prostate cancer EXAM: DG HIP (WITH OR WITHOUT PELVIS) 2-3V RIGHT COMPARISON:  CT 07/29/2019 FINDINGS: Extensive sclerotic metastatic disease involving much of the left hemipelvis, the supra-acetabular aspect of the right ilium, as well as within the sacrum and L5 vertebral body. Remainder of the bones demonstrate demineralization. No acute fracture is identified. Partial visualization of a left-sided nephroureteral stent. IMPRESSION: 1. Extensive sclerotic metastatic disease. 2. No acute fracture or dislocation identified involving the right hip. Electronically Signed   By: Davina Poke D.O.   On: 10/28/2019 09:19   VAS US CAROTID (at [facility] and [facility] only)  Result Date: 10/28/2019 Carotid Arterial Duplex Study Indications:       CVA and Weakness. Risk Factors:      Hypertension. Other Factors:     Metastatic prostate cancer. Comparison Study:  No prior study on file Performing Technologist: Sharion Dove RVS  Examination Guidelines: A complete evaluation includes B-mode imaging, spectral Doppler, color Doppler, and power Doppler as needed of all accessible portions of each vessel. Bilateral testing is considered an integral part of a  complete examination. Limited examinations for reoccurring indications may be performed as noted.  Right Carotid Findings: +----------+--------+--------+--------+------------------+------------------+           PSV cm/sEDV cm/sStenosisPlaque DescriptionComments           +----------+--------+--------+--------+------------------+------------------+ CCA Prox  98      14                                intimal thickening +----------+--------+--------+--------+------------------+------------------+ CCA Distal65      14                                intimal thickening +----------+--------+--------+--------+------------------+------------------+ ICA Prox  58      19  heterogenous                         +----------+--------+--------+--------+------------------+------------------+ ICA Distal74      21                                                   +----------+--------+--------+--------+------------------+------------------+ ECA       108     12                                                   +----------+--------+--------+--------+------------------+------------------+ +----------+--------+-------+--------+-------------------+           PSV cm/sEDV cmsDescribeArm Pressure (mmHG) +----------+--------+-------+--------+-------------------+ [id number]                                         +----------+--------+-------+--------+-------------------+ +---------+--------+--+--------+--+ VertebralPSV cm/s57EDV cm/s15 +---------+--------+--+--------+--+  Left Carotid Findings: +----------+--------+--------+--------+------------------+------------------+           PSV cm/sEDV cm/sStenosisPlaque DescriptionComments           +----------+--------+--------+--------+------------------+------------------+ CCA Prox  87      11                                intimal thickening  +----------+--------+--------+--------+------------------+------------------+ CCA Distal83      12                                intimal thickening +----------+--------+--------+--------+------------------+------------------+ ICA Prox  78      21              heterogenous                         +----------+--------+--------+--------+------------------+------------------+ ICA Distal78      18                                tortuous           +----------+--------+--------+--------+------------------+------------------+ ECA       57      5                                                    +----------+--------+--------+--------+------------------+------------------+ +----------+--------+--------+--------+-------------------+           PSV cm/sEDV cm/sDescribeArm Pressure (mmHG) +----------+--------+--------+--------+-------------------+ [id number]                                         +----------+--------+--------+--------+-------------------+ +---------+--------+--+--------+--+ VertebralPSV cm/s55EDV cm/s12 +---------+--------+--+--------+--+   Summary: Right Carotid: The extracranial vessels were near-normal with only minimal wall                thickening or plaque. Left Carotid: The extracranial vessels were near-normal with  only minimal wall               thickening or plaque. Vertebrals:  Bilateral vertebral arteries demonstrate antegrade flow. Subclavians: Normal flow hemodynamics were seen in bilateral subclavian              arteries. *See table(s) above for measurements and observations.  Electronically signed by Antony Contras MD on 10/28/2019 at 12:49:23 PM.    Final     Procedures Procedures (including critical care time)  Medications Ordered in ED Medications  sodium chloride flush (NS) 0.9 % injection 3 mL (has no administration in time range)    ED Course  I have reviewed the triage vital signs and the nursing notes.  Pertinent labs &  imaging results that were available during my care of the patient were reviewed by me and considered in my medical decision making (see chart for details).    MDM Rules/Calculators/A&P                          86 year old male presenting after fall but did not sustain any significant injuries from this.  He reports weakness in his legs.  His chronic weakness in his left leg he feels like this is stable.  Unclear is actually having more focal weakness in his right leg only.  Seems symmetric on exam.  Neuro exam is nonfocal.  Generalized weakness from UTI or dehydration?  Some AKI noted.  IV fluids.  UA is pending.  Will obtain a CT of his head.  Possible TIA.  Final Clinical Impression(s) / ED Diagnoses Final diagnoses:  Right leg weakness    Rx / DC Orders ED Discharge Orders    None       Virgel Manifold, MD 10/28/19 2311

## 2019-10-27 NOTE — ED Triage Notes (Signed)
Per PTAR report, Pt arrives from home via PTAR, Lives alone, fell about 0300 this morning, EMS eval at that time. He has no c/o pain, significant weakness in the right leg, having difficulty standing by himself. Negative stroke scale, negative orthostatics. Strong odorous urine. Hx HTN, bone/prostate CA. VSS 118/78, hr 104, rr 16, 95% RA.

## 2019-10-27 NOTE — ED Notes (Signed)
Patient aware that we need urine sample for testing, unable at this time. Pt given instruction on providing urine sample when able to do so.   

## 2019-10-28 ENCOUNTER — Observation Stay (HOSPITAL_BASED_OUTPATIENT_CLINIC_OR_DEPARTMENT_OTHER): Payer: Medicare Other

## 2019-10-28 ENCOUNTER — Observation Stay (HOSPITAL_COMMUNITY): Payer: Medicare Other

## 2019-10-28 DIAGNOSIS — K219 Gastro-esophageal reflux disease without esophagitis: Secondary | ICD-10-CM

## 2019-10-28 DIAGNOSIS — Z6820 Body mass index (BMI) 20.0-20.9, adult: Secondary | ICD-10-CM | POA: Diagnosis not present

## 2019-10-28 DIAGNOSIS — Y92009 Unspecified place in unspecified non-institutional (private) residence as the place of occurrence of the external cause: Secondary | ICD-10-CM

## 2019-10-28 DIAGNOSIS — G459 Transient cerebral ischemic attack, unspecified: Secondary | ICD-10-CM

## 2019-10-28 DIAGNOSIS — R197 Diarrhea, unspecified: Secondary | ICD-10-CM

## 2019-10-28 DIAGNOSIS — Z79899 Other long term (current) drug therapy: Secondary | ICD-10-CM | POA: Diagnosis not present

## 2019-10-28 DIAGNOSIS — S0990XA Unspecified injury of head, initial encounter: Secondary | ICD-10-CM | POA: Diagnosis not present

## 2019-10-28 DIAGNOSIS — E559 Vitamin D deficiency, unspecified: Secondary | ICD-10-CM

## 2019-10-28 DIAGNOSIS — C61 Malignant neoplasm of prostate: Secondary | ICD-10-CM | POA: Diagnosis not present

## 2019-10-28 DIAGNOSIS — S79911A Unspecified injury of right hip, initial encounter: Secondary | ICD-10-CM | POA: Diagnosis not present

## 2019-10-28 DIAGNOSIS — Z888 Allergy status to other drugs, medicaments and biological substances status: Secondary | ICD-10-CM | POA: Diagnosis not present

## 2019-10-28 DIAGNOSIS — R627 Adult failure to thrive: Secondary | ICD-10-CM | POA: Diagnosis not present

## 2019-10-28 DIAGNOSIS — R112 Nausea with vomiting, unspecified: Secondary | ICD-10-CM | POA: Diagnosis present

## 2019-10-28 DIAGNOSIS — Z923 Personal history of irradiation: Secondary | ICD-10-CM | POA: Diagnosis not present

## 2019-10-28 DIAGNOSIS — N1330 Unspecified hydronephrosis: Secondary | ICD-10-CM | POA: Diagnosis present

## 2019-10-28 DIAGNOSIS — C7951 Secondary malignant neoplasm of bone: Secondary | ICD-10-CM

## 2019-10-28 DIAGNOSIS — J3489 Other specified disorders of nose and nasal sinuses: Secondary | ICD-10-CM | POA: Diagnosis not present

## 2019-10-28 DIAGNOSIS — Z886 Allergy status to analgesic agent status: Secondary | ICD-10-CM | POA: Diagnosis not present

## 2019-10-28 DIAGNOSIS — E860 Dehydration: Secondary | ICD-10-CM | POA: Diagnosis present

## 2019-10-28 DIAGNOSIS — Z8249 Family history of ischemic heart disease and other diseases of the circulatory system: Secondary | ICD-10-CM | POA: Diagnosis not present

## 2019-10-28 DIAGNOSIS — Z91041 Radiographic dye allergy status: Secondary | ICD-10-CM | POA: Diagnosis not present

## 2019-10-28 DIAGNOSIS — N179 Acute kidney failure, unspecified: Secondary | ICD-10-CM | POA: Diagnosis present

## 2019-10-28 DIAGNOSIS — G319 Degenerative disease of nervous system, unspecified: Secondary | ICD-10-CM | POA: Diagnosis not present

## 2019-10-28 DIAGNOSIS — I10 Essential (primary) hypertension: Secondary | ICD-10-CM | POA: Diagnosis present

## 2019-10-28 DIAGNOSIS — R634 Abnormal weight loss: Secondary | ICD-10-CM

## 2019-10-28 DIAGNOSIS — K449 Diaphragmatic hernia without obstruction or gangrene: Secondary | ICD-10-CM

## 2019-10-28 DIAGNOSIS — M25552 Pain in left hip: Secondary | ICD-10-CM | POA: Diagnosis present

## 2019-10-28 DIAGNOSIS — M810 Age-related osteoporosis without current pathological fracture: Secondary | ICD-10-CM | POA: Diagnosis not present

## 2019-10-28 DIAGNOSIS — W19XXXA Unspecified fall, initial encounter: Secondary | ICD-10-CM | POA: Diagnosis not present

## 2019-10-28 DIAGNOSIS — Z20822 Contact with and (suspected) exposure to covid-19: Secondary | ICD-10-CM | POA: Diagnosis present

## 2019-10-28 DIAGNOSIS — R29898 Other symptoms and signs involving the musculoskeletal system: Secondary | ICD-10-CM | POA: Diagnosis present

## 2019-10-28 DIAGNOSIS — Z881 Allergy status to other antibiotic agents status: Secondary | ICD-10-CM | POA: Diagnosis not present

## 2019-10-28 DIAGNOSIS — G893 Neoplasm related pain (acute) (chronic): Secondary | ICD-10-CM | POA: Diagnosis present

## 2019-10-28 LAB — CBC
HCT: 31.9 % — ABNORMAL LOW (ref 39.0–52.0)
Hemoglobin: 9.8 g/dL — ABNORMAL LOW (ref 13.0–17.0)
MCH: 30.2 pg (ref 26.0–34.0)
MCHC: 30.7 g/dL (ref 30.0–36.0)
MCV: 98.5 fL (ref 80.0–100.0)
Platelets: 351 10*3/uL (ref 150–400)
RBC: 3.24 MIL/uL — ABNORMAL LOW (ref 4.22–5.81)
RDW: 15.6 % — ABNORMAL HIGH (ref 11.5–15.5)
WBC: 8.6 10*3/uL (ref 4.0–10.5)
nRBC: 0 % (ref 0.0–0.2)

## 2019-10-28 LAB — BASIC METABOLIC PANEL
Anion gap: 9 (ref 5–15)
BUN: 23 mg/dL (ref 8–23)
CO2: 26 mmol/L (ref 22–32)
Calcium: 8.7 mg/dL — ABNORMAL LOW (ref 8.9–10.3)
Chloride: 107 mmol/L (ref 98–111)
Creatinine, Ser: 1.63 mg/dL — ABNORMAL HIGH (ref 0.61–1.24)
GFR calc Af Amer: 43 mL/min — ABNORMAL LOW (ref >60)
GFR calc non Af Amer: 37 mL/min — ABNORMAL LOW (ref >60)
Glucose, Bld: 131 mg/dL — ABNORMAL HIGH (ref 70–99)
Potassium: 3.7 mmol/L (ref 3.5–5.1)
Sodium: 142 mmol/L (ref 135–145)

## 2019-10-28 LAB — ECHOCARDIOGRAM COMPLETE
AR max vel: 2.86 cm2
AV Area VTI: 2.97 cm2
AV Area mean vel: 2.87 cm2
AV Mean grad: 5 mmHg
AV Peak grad: 10.4 mmHg
Ao pk vel: 1.61 m/s
Area-P 1/2: 3.53 cm2
Calc EF: 63 %
Height: 73 in
S' Lateral: 2.27 cm
Single Plane A2C EF: 66.5 %
Single Plane A4C EF: 57.7 %
Weight: 2528 oz

## 2019-10-28 LAB — TSH: TSH: 1.193 u[IU]/mL (ref 0.350–4.500)

## 2019-10-28 LAB — HEMOGLOBIN A1C
Hgb A1c MFr Bld: 5.9 % — ABNORMAL HIGH (ref 4.8–5.6)
Mean Plasma Glucose: 122.63 mg/dL

## 2019-10-28 LAB — LIPID PANEL
Cholesterol: 101 mg/dL (ref 0–200)
HDL: 33 mg/dL — ABNORMAL LOW (ref >40)
LDL Cholesterol: 54 mg/dL (ref 0–99)
Total CHOL/HDL Ratio: 3.1 RATIO
Triglycerides: 71 mg/dL (ref <150)
VLDL: 14 mg/dL (ref 0–40)

## 2019-10-28 LAB — SARS CORONAVIRUS 2 BY RT PCR (HOSPITAL ORDER, PERFORMED IN ~~LOC~~ HOSPITAL LAB): SARS Coronavirus 2: NEGATIVE

## 2019-10-28 MED ORDER — LOSARTAN POTASSIUM 50 MG PO TABS
50.0000 mg | ORAL_TABLET | Freq: Every day | ORAL | Status: DC
  Administered 2019-10-28 – 2019-10-29 (×2): 50 mg via ORAL
  Filled 2019-10-28 (×2): qty 1

## 2019-10-28 MED ORDER — LOPERAMIDE HCL 2 MG PO CAPS: 2.0000 mg | ORAL_CAPSULE | Freq: Three times a day (TID) | ORAL | Status: DC | PRN

## 2019-10-28 MED ORDER — AMLODIPINE BESYLATE 5 MG PO TABS
5.0000 mg | ORAL_TABLET | Freq: Every day | ORAL | Status: DC
  Administered 2019-10-28 – 2019-10-29 (×2): 5 mg via ORAL
  Filled 2019-10-28 (×2): qty 1

## 2019-10-28 MED ORDER — LACTATED RINGERS IV SOLN: INTRAVENOUS | Status: DC

## 2019-10-28 MED ORDER — TRAZODONE HCL 50 MG PO TABS: 25.0000 mg | ORAL_TABLET | Freq: Every evening | ORAL | Status: DC | PRN

## 2019-10-28 MED ORDER — TRAMADOL HCL 50 MG PO TABS: 50.0000 mg | ORAL_TABLET | Freq: Three times a day (TID) | ORAL | Status: DC | PRN

## 2019-10-28 MED ORDER — ONDANSETRON HCL 4 MG/2ML IJ SOLN: 4.0000 mg | Freq: Four times a day (QID) | INTRAMUSCULAR | Status: DC | PRN

## 2019-10-28 MED ORDER — ONDANSETRON HCL 4 MG PO TABS: 4.0000 mg | ORAL_TABLET | Freq: Four times a day (QID) | ORAL | Status: DC | PRN

## 2019-10-28 MED ORDER — PANTOPRAZOLE SODIUM 40 MG PO TBEC
40.0000 mg | DELAYED_RELEASE_TABLET | Freq: Every day | ORAL | Status: DC
  Administered 2019-10-28 – 2019-10-29 (×2): 40 mg via ORAL
  Filled 2019-10-28 (×3): qty 1

## 2019-10-28 MED ORDER — METOPROLOL TARTRATE 5 MG/5ML IV SOLN: 5.0000 mg | Freq: Four times a day (QID) | INTRAVENOUS | Status: DC | PRN

## 2019-10-28 MED ORDER — ENOXAPARIN SODIUM 40 MG/0.4ML ~~LOC~~ SOLN
40.0000 mg | SUBCUTANEOUS | Status: DC
  Administered 2019-10-28 – 2019-10-29 (×2): 40 mg via SUBCUTANEOUS
  Filled 2019-10-28 (×2): qty 0.4

## 2019-10-28 MED ORDER — FAMOTIDINE 20 MG PO TABS
40.0000 mg | ORAL_TABLET | Freq: Every day | ORAL | Status: DC
  Administered 2019-10-28 – 2019-10-29 (×2): 40 mg via ORAL
  Filled 2019-10-28 (×2): qty 2

## 2019-10-28 MED ORDER — ACETAMINOPHEN 500 MG PO TABS: 1000.0000 mg | ORAL_TABLET | Freq: Four times a day (QID) | ORAL | Status: DC | PRN

## 2019-10-28 NOTE — Progress Notes (Signed)
PROGRESS NOTE  SENDER RUEB PQD:[id number] DOB: [birth year] DOA: 10/27/2019 PCP: Cassandria Anger, MD  Brief History   69yom PMH metastatic prostate cancer, 30lb weight loss, GERD/hiatal hernia presented to ED from home after fall when right leg slipped. Chronic left hip pain from prostate cancer.   A & P  Fall at home secondary right leg weakness or slip. EDP and admitting physician exams--no focal weakness --pt denies right leg weakness > foot just slipped --MRI brain negative. Discussed w/ Dr. Cheral Marker who reviewed, findings are artifact. No evidence of stroke. This correlates w/ history and exam performed. I see no reason to suspect TIA or stroke. No further evaluation planned. --echo done, read pending --bilateral carotid u/s unremarkable  AKI --minimal improvement; favor secondary to poor oral intake --IVF and check BMP in AM  FTT, 30lb weight loss secondary to complicated hiatal hernia, chronic n/v. PMH Nissen fundoplication. --per surgery there are no options. Pt reports he can sometimes eat well when food pleases him --continue Pepcid --outpatient follow-up  Metastatic prostate cancer with progression despite Depo-Lupron. --follow-up as an outpatient   Disposition Plan:  Discussion: feels better; no evidence of neuro event. Will focus on AKI and PT consult. Anticipate home when renal fxn improvement.  Status is: Observation  The patient will require care spanning > 2 midnights and should be moved to inpatient because: IV treatments appropriate due to intensity of illness or inability to take PO  Dispo: The patient is from: Home lives alone              Anticipated d/c is to: Home              Anticipated d/c date is: 1 day              Patient currently is not medically stable to d/c.  DVT prophylaxis: enoxaparin (LOVENOX) injection 40 mg Start: 10/28/19 1000 Code Status: Full Family Communication: none present or requested   Murray Hodgkins, MD  [facility] Direct contact: see [email or web address] (further directions at bottom of note if needed) 7PM-7AM contact night coverage as at bottom of note 10/28/2019, 3:09 PM  LOS: 0 days   Significant Hospital Events   .    Consults:  .    Procedures:  .   Significant Diagnostic Tests:  [name]    Micro Data:  .    Antimicrobials:  .   Interval History/Subjective  Feels much better; right foot slipped yesterday; no new weakness, no RLE weakness. Reports chronic LLE weakness from prostate cancer and radiation. No new deficits. Diet intake variable but can eat meals at times, depends on if he wants the food.  Objective   Vitals:  Vitals:   10/28/19 1404 10/28/19 1405  BP: (!) 141/78 (!) 141/78  Pulse: 93 91  Resp:  21  Temp:    SpO2: 96% 96%    Exam:  Constitutional:   . Appears calm and comfortable Eyes:  . pupils and irises appear normal ENMT:  . grossly normal hearing  Respiratory:  . CTA bilaterally, no w/r/r.  . Respiratory effort normal.  Cardiovascular:  . RRR, no m/r/g . No LE extremity edema   Abdomen:  . soft Musculoskeletal:  . RUE, LUE, RLE, LLE   o strength and tone normal, no atrophy, no abnormal movements o No tenderness, masses Neurologic:  . CN 2-12 intact . Sensation all 4 extremities intact Psychiatric:  . Mental status o Mood, affect appropriate  I have personally reviewed  the following:   Today's Data  . Creatinine 1.72 > 1.63 . K+ 3.7 . Hgb 11.0 > 9.8 . MRI noted  Scheduled Meds: . amLODipine  5 mg Oral Daily  . enoxaparin (LOVENOX) injection  40 mg Subcutaneous Q24H  . famotidine  40 mg Oral Daily  . losartan  50 mg Oral Daily  . pantoprazole  40 mg Oral Daily   Continuous Infusions: . lactated ringers 100 mL/hr at 10/28/19 9179    Principal Problem:   AKI (acute kidney injury) ([place]) Active Problems:   Vitamin D deficiency   HTN (hypertension)   GERD   Weight loss   Diarrhea   Large hiatal hernia   Prostate cancer  metastatic to bone [facility])   Hydronephrosis of left kidney   Right leg weakness   Fall at home, initial encounter   LOS: 0 days   How to contact the [facility] Attending or Consulting provider [place] or covering provider during after hours [place], for this patient?  1. Check the care team in [facility] and look for a) attending/consulting [facility] provider listed and b) the [facility] team listed 2. Log into [email or web address] and use [place]'s universal password to access. If you do not have the password, please contact the hospital operator. 3. Locate the [facility] provider you are looking for under [facility] and page to a number that you can be directly reached. 4. If you still have difficulty reaching the provider, please page the [facility] (Director on Call) for the Hospitalists listed on amion for assistance.

## 2019-10-28 NOTE — Progress Notes (Signed)
  Echocardiogram 2D Echocardiogram has been performed.  Bobbye Charleston 10/28/2019, 12:13 PM

## 2019-10-28 NOTE — Evaluation (Signed)
Physical Therapy Evaluation Patient Details Name: Jorge [name] MRN: [id number] DOB: [birth year] Today's Date: 10/28/2019   History of Present Illness  Patient is 86 y.o. male with PMH significant for metastatic prostate cancer, hypertension, GERD, recent 30 pound weight loss, large hiatal hernia with poor appetite recently and minimal p.o. intake due to this hiatal hernia. Patient presented to [facility] on 10/27/19 following a fall. reports that his right leg slipped out from under him and then he fell and hit his head.  He fell on a carpeted surface and did not lose consciousness.  He was unable to get up off the floor due to weakness. Friends/family arried and were unable to help him up from the couch and urged him to go to the ED. In the ED he was noted to have a normal head CT, acute kidney injury with a creatinine of 1.72.  No evidence of stroke on MRI and unremarkable bil carotid u/s.     Clinical Impression  Jorge [name] is 86 y.o. male admitted with above HPI and diagnosis. Patient is currently limited by functional impairments below (see PT problem list). Patient lives alone and is independent with [facility] for mobility at baseline. Patient currently requires min assist/guard for bed mobility and transfers/gait with SPC. Patient will benefit from continued skilled PT interventions to address impairments and progress independence with mobility, recommending SNF level therapy vs. return home with HHPT and 24/7 assist initially. Acute PT will follow and progress as able.     Follow Up Recommendations SNF;Home health PT;Supervision/Assistance - 24 hour (HHPT if pt has 24/7 assist available, vs. ST at SNF)    Equipment Recommendations  None recommended by PT    Recommendations for Other Services       Precautions / Restrictions Precautions Precautions: Fall Precaution Comments: reports this is the only fall in last 6 months Restrictions Weight Bearing Restrictions: No      Mobility  Bed  Mobility Overal bed mobility: Needs Assistance Bed Mobility: Supine to Sit;Sit to Supine     Supine to sit: HOB elevated;Min assist Sit to supine: Min guard;HOB elevated   General bed mobility comments: assist to fully raise trunk up to EOB, pt reports he has a bra t home on bed he uses. pt able to raise LE's into bed to return to supine. Pt perforemd 2x bridge to scoot and reposition in bed.  Transfers Overall transfer level: Needs assistance Equipment used: Straight cane Transfers: Sit to/from Stand Sit to Stand: Min guard         General transfer comment: min guard for safety, pt able to complete rise and steady self in standing.  Ambulation/Gait Ambulation/Gait assistance: Min guard Gait Distance (Feet): 15 Feet   Gait Pattern/deviations: Step-through pattern;Decreased step length - right;Decreased step length - left;Decreased stride length;Trunk flexed;Narrow base of support Gait velocity: decr   General Gait Details: pt using SPC in Rt UE for gait. pt able to take several small steps forward and backwards and side to side in ED room. He is mildly unsteady but no overt LOB noted and pt able to stabilize self with SPC.   Stairs       Wheelchair Mobility    Modified Rankin (Stroke Patients Only)       Balance Overall balance assessment: Needs assistance Sitting-balance support: Feet supported Sitting balance-Leahy Scale: Good     Standing balance support: During functional activity;Single extremity supported Standing balance-Leahy Scale: Fair Standing balance comment: stale with static standing, requires SPC  for gait.            Pertinent Vitals/Pain Pain Assessment: No/denies pain    Home Living Family/patient expects to be discharged to:: Private residence Living Arrangements: Alone Available Help at Discharge: Family;Friend(s) Type of Home: House Home Access: Stairs to enter Entrance Stairs-Rails: None;Can reach both (rails in back, none in  front) Entrance Stairs-Number of Steps: 6 at back, 3 at front Home Layout: One level Home Equipment: [place] - 2 wheels;Cane - single point;Shower seat;Grab bars - tub/shower Additional Comments: pt reports family/friends check on him ~2-3x/day and his neighbors do as well. He has 2 friends who can stay with him as needed for as long as he needs them to.    Prior Function Level of Independence: Independent with assistive device(s)         Comments: pt is independent with ADL's and sits on seat in shower to bathe. He uses briefs a home due to incontinence. He uses SPC for mobility. Pt cooks his meals and also does microwave meals. He enjoys going to the farmers market to buy fresh tomatoes.     Hand Dominance   Dominant Hand: Right    Extremity/Trunk Assessment   Upper Extremity Assessment Upper Extremity Assessment: Generalized weakness    Lower Extremity Assessment Lower Extremity Assessment: Generalized weakness;RLE deficits/detail;LLE deficits/detail RLE Deficits / Details: 4/5 for knee ext, dorsilex/plantaflexion. 3/5 for knee flexion. RLE Sensation: WNL RLE Coordination: WNL LLE Deficits / Details: 4/5 for knee ext, dorsilex/plantaflexion. 3/5 for knee flexion. LLE Sensation: WNL LLE Coordination: WNL    Cervical / Trunk Assessment Cervical / Trunk Assessment: Kyphotic  Communication   Communication: HOH  Cognition Arousal/Alertness: Awake/alert Behavior During Therapy: WFL for tasks assessed/performed Overall Cognitive Status: Within Functional Limits for tasks assessed           General Comments      Exercises     Assessment/Plan    PT Assessment Patient needs continued PT services  PT Problem List Decreased strength;Decreased activity tolerance;Decreased mobility;Decreased balance;Decreased knowledge of use of DME;Decreased safety awareness       PT Treatment Interventions DME instruction;Gait training;Stair training;Functional mobility  training;Therapeutic activities;Therapeutic exercise;Balance training;Patient/family education    PT Goals (Current goals can be found in the Care Plan section)  Acute Rehab PT Goals Patient Stated Goal: go home tomorrow PT Goal Formulation: With patient Time For Goal Achievement: 11/11/19 Potential to Achieve Goals: Good    Frequency Min 3X/week   Barriers to discharge   Pt reports friends can stay 24/7, need to confirm Patient also has stairs to enter home.       AM-PAC PT "6 Clicks" Mobility  Outcome Measure Help needed turning from your back to your side while in a flat bed without using bedrails?: A Little Help needed moving from lying on your back to sitting on the side of a flat bed without using bedrails?: A Little Help needed moving to and from a bed to a chair (including a wheelchair)?: A Little Help needed standing up from a chair using your arms (e.g., wheelchair or bedside chair)?: A Little Help needed to walk in hospital room?: A Little Help needed climbing 3-5 steps with a railing? : A Lot 6 Click Score: 17    End of Session Equipment Utilized During Treatment: Gait belt Activity Tolerance: Patient tolerated treatment well Patient left: in bed;with call bell/phone within reach Nurse Communication: Mobility status PT Visit Diagnosis: Unsteadiness on feet (R26.81);Muscle weakness (generalized) (M62.81);Difficulty in walking, not elsewhere  classified (R26.2)    Time: 9106-8166 PT Time Calculation (min) (ACUTE ONLY): 33 min   Charges:   PT Evaluation $PT Eval Moderate Complexity: 1 Mod PT Treatments $Therapeutic Activity: 8-22 mins        Verner Mould, DPT Acute Rehabilitation Services  Office [phone] Pager [phone]  10/28/2019 4:27 PM

## 2019-10-28 NOTE — ED Notes (Signed)
Physical therapy at bedside with patient at this time. 

## 2019-10-28 NOTE — ED Notes (Signed)
ECHO at bedside.

## 2019-10-28 NOTE — ED Notes (Signed)
Patient transported to MRI 

## 2019-10-28 NOTE — H&P (Signed)
History and Physical    SHAUL TRAUTMAN XIP:[id number] DOB: Nov 20, 1929 DOA: 10/27/2019  PCP: Cassandria Anger, MD  Patient coming from: Home  I have personally briefly reviewed patient's old medical records in [place]  Chief Complaint: Status post fall today  HPI: Jorge [name] is a 86 y.o. male with medical history significant of metastatic prostate cancer, hypertension, GERD, recent 30 pound weight loss, large hiatal hernia with poor appetite recently and minimal p.o. intake due to this hiatal hernia who presents to the ED following a fall today.  He reports metastatic prostate cancer in his left hip which makes that hip week but has been in his normal state of health and able to get around and up with his right leg until today.  Today he reports that his right leg slipped out from under him and then he fell and hit his head.  He fell on a carpeted surface and did not lose consciousness.  He was unable to get up off the floor due to weakness.  People did arrive to his home to try to help him and they did get him standing again, however when he sat back down on the couch, he was unable to rise again.  He lives alone. He reports recent EGD with a surgeon who said there was nothing else that could be done for his complicated hiatal hernia, previous Nissen fundoplication, and ongoing medical issues.  He has had ongoing nausea and vomiting with recent 30 pound weight loss due to this. He has longstanding prostate cancer with progression despite every 3 month Depo-Lupron.  With recent trials of Zytiga and Xtandi which had to be discontinued due to ongoing nausea and vomiting.  He reports a recent 3-week history of loose stools that he has been unable to control and has a history of diarrhea for which she takes Lomotil and Flagyl.  ED Course: In the ED he was noted to have a normal head CT, acute kidney injury with a creatinine of 1.72.  We are asked to admit for possible TIA work-up, and  for hydration which might be accounting for his acute kidney injury.  Review of Systems: As per HPI otherwise 10 point review of systems negative.   Past Medical History:  Diagnosis Date  . Anemia   . Anxiety   . ED (erectile dysfunction)   . First degree heart block   . GERD (gastroesophageal reflux disease)    01-30-2019   per pt no issue since surgery 06/ 2019  . Hernia, hiatal GI-- dr p. hung   hx s/p [facility] repair 03/ 2013,  recurrent w/ gastric outlet obstruction 06/ 2019 s/p  open takedown/ gastrostomy tube;  last EGD 06-15-219 epic ,  large HH  . History of colonic polyps   . History of gastric ulcer 2008   w/ erosive esophagitis  . History of syncope    01/ 2011   per ED visit in epic ,  vasovagal epidose, negative work-up  . Hx of radiation therapy    prostate cancer ,  completed 03/ 2011  . Hydronephrosis of left kidney   . Hydronephrosis, left    due to malignant lymphadenopathy-- treated with ureteral stent  . Hypertension   . Insomnia   . Metastatic castration-resistant adenocarcinoma of prostate [facility]) urologist-- dr dahlstedt/  oncologist--- dr Benay Spice   incidental finding pt had suprapubic prostatectoy for BPH on  09-18-2006 found to have Gleason 7 prostate cancer;   completed radiation therapy  03/ 2011 for rising PSA;  recurrent w/ mets 06/ 2018  . Schatzki's ring   . Stricture of esophagus GI--- dr p. hung   (01-30-2019  per pt no issues since surgery 06/ 2019)   tortuous esophagus (noted in epic since 2008)  s/p  repair large [facility] 03/ 2013  recurrent w/ obstruction s/p takedown 06/ 2019  . Urinary incontinence   . Vitamin D deficiency   . Wears dentures    fuller upper and partial lower    Past Surgical History:  Procedure Laterality Date  . APPENDECTOMY  age 51  . CATARACT EXTRACTION W/ INTRAOCULAR LENS  IMPLANT, BILATERAL Bilateral yrs ago  . CYST REMOVAL NECK N/A 10/16/2014   Procedure: EXCISION CYST POSTERIOR NECK;  Surgeon: Autumn Messing III, MD;  Location:  [facility];  Service: General;  Laterality: N/A;  posterior  . CYSTOSCOPY W/ RETROGRADES Left 01/04/2018   Procedure: CYSTOSCOPY LEFT STENT PLACEMENT;  Surgeon: Franchot Gallo, MD;  Location: [facility];  Service: Urology;  Laterality: Left;  . CYSTOSCOPY W/ URETERAL STENT PLACEMENT Left 01/31/2019   Procedure: CYSTOSCOPY WITH STENT REPLACEMENT;  Surgeon: Franchot Gallo, MD;  Location[facility];  Service: Urology;  Laterality: Left;  . ESOPHAGOGASTRODUODENOSCOPY  06/05/2011   Procedure: ESOPHAGOGASTRODUODENOSCOPY (EGD);  Surgeon: Gatha Mayer, MD;  Location: [facility];  Service: Endoscopy;  Laterality: N/A;  . ESOPHAGOGASTRODUODENOSCOPY N/A 09/16/2017   Procedure: ESOPHAGOGASTRODUODENOSCOPY (EGD);  Surgeon: Carol Ada, MD;  [facility]: [facility];  Service: Endoscopy;  Laterality: N/A;  . ESOPHAGOGASTRODUODENOSCOPY N/A 09/30/2019   Procedure: UPPER ENDOSCOPY;  Surgeon: Johnathan Hausen, MD;  Location: [facility];  Service: General;  Laterality: N/A;  . GASTROSTOMY N/A 09/20/2017   Procedure: INSERTION OF GASTROSTOMY TUBE;  Surgeon: Stark Klein, MD;  Location: [facility];  Service: General;  Laterality: N/A;  . HIATAL HERNIA REPAIR  06/10/2011   Procedure: LAPAROSCOPIC REPAIR OF HIATAL HERNIA;  Surgeon: Pedro Earls, MD;  Location: [facility];  Service: General;  Laterality: N/A;  Laparoscopic repair of paraesophageal hernia.  . SUPRAPUBIC PROSTATECTOMY  09-18-2006   @[facility]      reports that he has never smoked. He has never used smokeless tobacco. He reports previous alcohol use. He reports that he does not use drugs.  Allergies  Allergen Reactions  . Aspirin Other (See Comments)    Ulcerated stomach  . Azithromycin Shortness Of Breath  . Cefuroxime Axetil Shortness Of Breath  . Iodinated Diagnostic Agents Anaphylaxis  . Iodine Other (See Comments)    Per pt tremors and dyspnea  . Other Other (See Comments)    Twist in esophagus so patient can not take  large pills/capsules  . Naproxen Other (See Comments)     upset stomach - like with other NSAIDs  . Penicillins Rash    Can take the shot, but not the pill 18 months ago     Family History  Problem Relation Age of Onset  . Pancreatic cancer Father   . Hypertension Father   . Cancer Father   . Hypertension Other      Prior to Admission medications   Medication Sig Start Date End Date Taking? Authorizing Provider  acetaminophen (TYLENOL) 500 MG tablet Take 1,000 mg by mouth every 6 (six) hours as needed for mild pain or moderate pain.    [provider]  amLODipine (NORVASC) 5 MG tablet Take 1 tablet (5 mg total) by mouth daily. 07/22/19 07/21/20  [name], [name], MD  diphenhydramine-acetaminophen (TYLENOL PM)  25-500 MG TABS tablet Take 2 tablets by mouth at bedtime as needed (sleep).     [provider]  famotidine (PEPCID) 40 MG tablet Take 1 tablet (40 mg total) by mouth daily. 10/03/19   [name], [name], MD  hydrOXYzine (VISTARIL) 25 MG capsule TAKE 1 CAPSULE BY MOUTH EVERY 8 HOURS AS NEEDED FOR INSOMNIA 10/25/19   [name], [name], MD  Leuprolide Acetate, 3 Month, (LUPRON DEPOT-PED, 19-MONTH, IM) Inject into the muscle every 3 (three) months.    [provider]  loperamide (IMODIUM A-D) 2 MG tablet Take 1-2 tablets (2-4 mg total) by mouth 3 (three) times daily as needed for diarrhea or loose stools. 10/21/19   [name], [name], MD  losartan (COZAAR) 100 MG tablet Take 0.5 tablets (50 mg total) by mouth daily. 10/03/19   [name], [name], MD  polyethylene glycol powder (GLYCOLAX/MIRALAX) powder Take 17 g by mouth 2 (two) times daily as needed for mild constipation or moderate constipation. 01/10/18   [name], [name], MD  polyvinyl alcohol (LIQUIFILM TEARS) 1.4 % ophthalmic solution Place 1 drop into both eyes daily as needed for dry eyes.     [provider]  potassium chloride (KLOR-CON) 10 MEQ tablet Take 1 tablet (10 mEq total)  by mouth daily. 07/22/19   [name], [name], MD    Physical Exam: Vitals:   10/27/19 2136 10/27/19 2159 10/27/19 2229 10/27/19 2259  BP:  (!) 136/75 (!) 130/70 (!) 134/79  Pulse:  98 97 94  Resp:  (!) 26 (!) 26 (!) 26  Temp:      TempSrc:      SpO2:  96% 95% 95%  Weight: 71.7 kg     Height: 6\' 1"  (1.854 m)       Constitutional: NAD, calm, comfortable, old, frail Eyes: PERRL, lids and conjunctivae normal ENMT: Mucous membranes are moist. Posterior pharynx clear of any exudate or lesions.Normal dentition.  Neck: normal, supple, no masses, no thyromegaly Respiratory: clear to auscultation bilaterally, no wheezing, no crackles. Normal respiratory effort. No accessory muscle use.  Cardiovascular: Regular rate and rhythm, no murmurs / rubs / gallops. No extremity edema. 2+ pedal pulses. No carotid bruits.  Abdomen: no tenderness, no masses palpated. No hepatosplenomegaly. Bowel sounds positive.  Musculoskeletal: no clubbing / cyanosis. No joint deformity upper and lower extremities. Good ROM, no contractures. Normal muscle tone.  Skin: no rashes, lesions, ulcers. No induration Neurologic: CN 2-12 grossly intact. Sensation intact, DTR normal. Strength 5/5 in all 4.  Psychiatric: Normal judgment and insight. Alert and oriented x 3. Normal mood.   Labs on Admission: I have personally reviewed following labs and imaging studies  CBC: Recent Labs  Lab 10/27/19 2138  WBC 9.8  HGB 11.0*  HCT 35.7*  MCV 97.8  PLT 354*   Basic Metabolic Panel: Recent Labs  Lab 10/27/19 2138  NA 144  K 4.5  CL 108  CO2 25  GLUCOSE 118*  BUN 27*  CREATININE 1.72*  CALCIUM 8.9   CBG: Recent Labs  Lab 10/27/19 2130  GLUCAP 106*   Urine analysis:    Component Value Date/Time   COLORURINE AMBER (A) 07/29/2019 1648   APPEARANCEUR HAZY (A) 07/29/2019 1648   LABSPEC 1.026 07/29/2019 1648   PHURINE 5.0 07/29/2019 1648   GLUCOSEU NEGATIVE 07/29/2019 1648   GLUCOSEU NEGATIVE 11/19/2009 0802    HGBUR SMALL (A) 07/29/2019 1648   BILIRUBINUR NEGATIVE 07/29/2019 1648   KETONESUR 5 (A) 07/29/2019 1648   PROTEINUR 100 (  A) 07/29/2019 1648   UROBILINOGEN 0.2 11/19/2009 0802   NITRITE NEGATIVE 07/29/2019 1648   LEUKOCYTESUR SMALL (A) 07/29/2019 1648    Radiological Exams on Admission: CT Head Wo Contrast  Result Date: 10/27/2019 CLINICAL DATA:  Recent fall with headaches facial pain, initial encounter EXAM: CT HEAD WITHOUT CONTRAST TECHNIQUE: Contiguous axial images were obtained from the base of the skull through the vertex without intravenous contrast. COMPARISON:  None. FINDINGS: Brain: Chronic atrophic and ischemic changes are noted. No findings to suggest acute hemorrhage, acute infarction or space-occupying mass lesion are noted. Vascular: No hyperdense vessel or unexpected calcification. Skull: Normal. Negative for fracture or focal lesion. Sinuses/Orbits: No acute finding. Other: None. IMPRESSION: Chronic atrophic and ischemic changes without acute abnormality. Electronically Signed   By: Inez Catalina M.D.   On: 10/27/2019 23:00    EKG: Independently reviewed.  Sinus tachycardia, multiple PVCs, abnormal R wave progression.  Assessment/Plan Principal Problem:   Right leg weakness Active Problems:   Vitamin D deficiency   HTN (hypertension)   GERD   Weight loss   Diarrhea   Large hiatal hernia   Prostate cancer metastatic to bone (HCC)   Hydronephrosis of left kidney   AKI (acute kidney injury) ([place])  Right leg weakness He is able to move all extremities and does not have any focal weakness on exam.  He has a negative CT of the head. Will admit for telemetry, echo, carotid Dopplers, MRI PT eval in the a.m.  Metastatic prostate cancer to bone Question has this caused his weakness We will get plain films of the hip If worsening disease will consider hematology oncology consult  Hypertension Continue Norvasc, Cozaar  Acute kidney injury Gentle IV fluid hydration  suspect this is related to poor p.o. intake and dehydration. Avoid nephrotoxic agents  Dehydration Related to GI losses from diarrhea and poor p.o. intake IV repletion  Abnormal weight loss Due to GERD plus significant hiatal hernia  Diarrhea Ongoing issue per primary care notes due to bacterial overgrowth, will continue Imodium  Large hiatal hernia Trial of PPI  Vitamin D deficiency   DVT prophylaxis: Lovenox SQ Code Status: Full code discussed with him at the bedside.  He states he is never considered this in the past and would like time to think about it Family Communication: Patient at bedside Disposition Plan: Unclear pending further work-up Consults called: None Admission status: Observation   Donnamae Jude MD [facility]  If 7PM-7AM, please contact night-coverage 10/28/2019, 12:27 AM

## 2019-10-29 DIAGNOSIS — Y92009 Unspecified place in unspecified non-institutional (private) residence as the place of occurrence of the external cause: Secondary | ICD-10-CM | POA: Diagnosis not present

## 2019-10-29 DIAGNOSIS — N179 Acute kidney failure, unspecified: Secondary | ICD-10-CM | POA: Diagnosis not present

## 2019-10-29 DIAGNOSIS — W19XXXA Unspecified fall, initial encounter: Secondary | ICD-10-CM | POA: Diagnosis not present

## 2019-10-29 LAB — CBC
HCT: 34.6 % — ABNORMAL LOW (ref 39.0–52.0)
Hemoglobin: 10.6 g/dL — ABNORMAL LOW (ref 13.0–17.0)
MCH: 30.6 pg (ref 26.0–34.0)
MCHC: 30.6 g/dL (ref 30.0–36.0)
MCV: 100 fL (ref 80.0–100.0)
Platelets: 369 10*3/uL (ref 150–400)
RBC: 3.46 MIL/uL — ABNORMAL LOW (ref 4.22–5.81)
RDW: 15.4 % (ref 11.5–15.5)
WBC: 9.3 10*3/uL (ref 4.0–10.5)
nRBC: 0 % (ref 0.0–0.2)

## 2019-10-29 LAB — BASIC METABOLIC PANEL
Anion gap: 10 (ref 5–15)
BUN: 20 mg/dL (ref 8–23)
CO2: 25 mmol/L (ref 22–32)
Calcium: 8.6 mg/dL — ABNORMAL LOW (ref 8.9–10.3)
Chloride: 108 mmol/L (ref 98–111)
Creatinine, Ser: 1.31 mg/dL — ABNORMAL HIGH (ref 0.61–1.24)
GFR calc Af Amer: 56 mL/min — ABNORMAL LOW (ref >60)
GFR calc non Af Amer: 49 mL/min — ABNORMAL LOW (ref >60)
Glucose, Bld: 117 mg/dL — ABNORMAL HIGH (ref 70–99)
Potassium: 3.7 mmol/L (ref 3.5–5.1)
Sodium: 143 mmol/L (ref 135–145)

## 2019-10-29 NOTE — Progress Notes (Signed)
D/C instructions given to patient. Patient had no questions. NT or writer will wheel patient out once he is dressed to go

## 2019-10-29 NOTE — TOC Initial Note (Signed)
Transition of Care [facility]) - Initial/Assessment Note    Patient Details  Name: Jorge [name] MRN: [id number] Date of Birth: Aug 13, 1929  Transition of Care [facility]) CM/SW Contact:    Lennart Pall, LCSW Phone Number: 10/29/2019, 12:49 PM  Clinical Narrative:    Met with pt today to review home supports and determine potential d/c needs.  Pt aware that therapies are currently recommending 24/7 support and he reports that he has this from a friend, Markus Jarvis, who is able to stay with him initially and provide this. He does have a Life Alert system and several other friends and family members who can check on him regularly.  Pt is very pleasant and fully oriented. Was independent with ADLs, driving and managing his medications and financial needs prior to admit.  He does have assistance with house keeping and friends have helped out with picking up groceries or other necessities if he ever needed this.  Pt plans to d/c home and is agreeable to having HHPT if this is recommended.  Will monitor progress and recommendations from MD and therapy.                 Expected Discharge Plan: [place] Barriers to Discharge: Continued Medical Work up   Patient Goals and CMS Choice Patient states their goals for this hospitalization and ongoing recovery are:: to go home CMS Medicare.gov Compare Post Acute Care list provided to:: Patient Choice offered to / list presented to : Patient  Expected Discharge Plan and Services Expected Discharge Plan: [place]       Living arrangements for the past 2 months: Single Family Home                                      Prior Living Arrangements/Services Living arrangements for the past 2 months: Single Family Home Lives with:: Self Patient language and need for interpreter reviewed:: Yes Do you feel safe going back to the place where you live?: Yes      Need for Family Participation in Patient Care: Yes  (Comment) Care giver support system in place?: Yes (comment) Current home services: Safety alert Criminal Activity/Legal Involvement Pertinent to Current Situation/Hospitalization: No - Comment as needed  Activities of Daily Living Home Assistive Devices/Equipment: Dentures (specify type), Eyeglasses, Cane (specify quad or straight) (upper/lower dentures, single point cane) ADL Screening (condition at time of admission) Patient's cognitive ability adequate to safely complete daily activities?: Yes Is the patient deaf or have difficulty hearing?: No Does the patient have difficulty seeing, even when wearing glasses/contacts?: No Does the patient have difficulty concentrating, remembering, or making decisions?: No Patient able to express need for assistance with ADLs?: Yes Does the patient have difficulty dressing or bathing?: No Independently performs ADLs?: No Communication: Independent Dressing (OT): Independent Grooming: Independent Feeding: Independent Bathing: Independent Toileting: Independent with device (comment) In/Out Bed: Independent Walks in Home: Independent with device (comment) Does the patient have difficulty walking or climbing stairs?: Yes Weakness of Legs: Both Weakness of Arms/Hands: None  Permission Sought/Granted Permission sought to share information with : Other (comment) Permission granted to share information with : Yes, Verbal Permission Granted  Share Information with NAME: Teressa Senter     Permission granted to share info w Relationship: sister-in-law  Permission granted to share info w Contact Information: [phone]  Emotional Assessment Appearance:: Appears stated age Attitude/Demeanor/Rapport: Engaged,  Gracious Affect (typically observed): Accepting, Appropriate, Pleasant Orientation: : Oriented to Self, Oriented to Place, Oriented to  Time, Oriented to Situation Alcohol / Substance Use: Not Applicable Psych Involvement: No  (comment)  Admission diagnosis:  Right leg weakness [R29.898] AKI (acute kidney injury) ([place]) [N17.9] Patient Active Problem List   Diagnosis Date Noted  . Right leg weakness 10/28/2019  . AKI (acute kidney injury) ([place]) 10/28/2019  . Fall at home, initial encounter 10/28/2019  . Weakness 10/03/2019  . Lesion of skin of nose 04/23/2019  . Hematuria, gross 04/09/2018  . Hypokalemia 04/09/2018  . Malnutrition ([place]) 04/09/2018  . Constipation 12/31/2017  . S/P prostatectomy 12/30/2017  . Prostate cancer metastatic to bone ([place]) 12/30/2017  . Hydronephrosis of left kidney 12/30/2017  . Small bowel obstruction ([place]) 09/16/2017  . Osteoarthritis 09/12/2017  . Hot flash due to medication 04/05/2017  . Squamous cell skin cancer 11/15/2016  . Elbow pain 10/21/2015  . 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 Insomnia 08/18/2014  . Sebaceous cyst 08/18/2014  . Cerumen impaction 07/23/2013  . Eczema 08/23/2012  . Pain in joint, shoulder region 08/23/2012  . LBP (low back pain) 05/21/2012  . Actinic keratoses 10/26/2011  . Contact dermatitis 09/22/2011  . Large hiatal hernia 07/13/2011  . Nonspecific (abnormal) findings on radiological and other examination of gastrointestinal tract 06/05/2011  . LUQ abdominal pain 04/22/2011  . Chest pain, atypical 04/22/2011  . Irreducible hiatal hernia 04/22/2011  . Neoplasm of uncertain behavior of skin 11/10/2010  . HIP PAIN 04/28/2010  . Diarrhea 10/07/2008  . ERECTILE DYSFUNCTION 08/22/2008  . Weight loss 08/22/2008  . PERSONAL HX COLON CANCER 11/06/2007  . Vitamin D deficiency 04/19/2007  . Anxiety state 04/19/2007  . HTN (hypertension) 04/19/2007  . GERD 04/19/2007  . PEPTIC ULCER DISEASE 04/19/2007  . CERVICAL STRAIN 04/19/2007  . COLONIC POLYPS, HX OF 04/19/2007   PCP:  Cassandria Anger, MD Pharmacy:   [facility], [address] [place] [address] Phone: [phone] Fax: [phone]  [facility], [address] [place] [address] Phone: [phone] Fax: [place], [address] [place] [address] Phone: [phone] Fax: [phone]     Social Determinants of Health (SDOH) Interventions    Readmission Risk Interventions Readmission Risk Prevention Plan 10/29/2019  Transportation Screening Complete  PCP or Specialist Appt within 5-7 Days Complete  Home Care Screening Complete  Medication Review (RN CM) Complete  Some recent data might be hidden

## 2019-10-29 NOTE — TOC Transition Note (Signed)
Transition of Care [facility]) - CM/SW Discharge Note   Patient Details  Name: Jorge [name] MRN: [id number] Date of Birth: 1929-08-11  Transition of Care [facility]) CM/SW Contact:  Lennart Pall, LCSW Phone Number: 10/29/2019, 3:03 PM   Clinical Narrative:    Alerted by RN that dc orders are in and pt cleared for d/c home with HHPT follow up.  Referral made to [facility].  No further needs.   Final next level of care: [place] Barriers to Discharge: Barriers Resolved   Patient Goals and CMS Choice Patient states their goals for this hospitalization and ongoing recovery are:: to go home CMS Medicare.gov Compare Post Acute Care list provided to:: Patient Choice offered to / list presented to : Patient  Discharge Placement                       Discharge Plan and Services                DME Arranged: N/A DME Agency: NA       HH Arranged: PT HH Agency: [facility] Date [place]: 10/29/19 Time [place]: 1502 Representative spoke with at [place]: Adela Lank  Social Determinants of Health ([place]) Interventions     Readmission Risk Interventions Readmission Risk Prevention Plan 10/29/2019  Transportation Screening Complete  PCP or Specialist Appt within 5-7 Days Complete  Home Care Screening Complete  Medication Review (RN CM) Complete  Some recent data might be hidden

## 2019-10-30 NOTE — Discharge Summary (Addendum)
Physician Discharge Summary  Jorge [name] GYI:[id number] DOB: [birth year] DOA: 10/27/2019  PCP: Cassandria Anger, MD  Admit date: 10/27/2019 Discharge date: 10/29/2019  Recommendations for Outpatient Follow-up:  1. Ongoing care for intermittent vomiting, FTT.   Follow-up Information    [name], [name], MD. Schedule an appointment as soon as possible for a visit in 2 week(s).   Specialty: Internal Medicine Contact information: [place] [address] [phone]        Care, [facility] Follow up.   Specialty: [place] Why: to provide home health physical therapy Contact information: [place] [address] [phone]                Discharge Diagnoses: Principal diagnosis is #1 1. Fall at home secondary to slip 2. AKI 3. FTT, 30lb weight loss secondary to complicated hiatal hernia, chronic n/v. PMH Nissen fundoplication.  Discharge Condition: improved Disposition: home with HHPT  Diet recommendation: low sodium heart healthy  Filed Weights   10/27/19 2124 10/27/19 2136  Weight: 71.7 kg 71.7 kg    History of present illness:  86yom PMH metastatic prostate cancer, 30lb weight loss, GERD/hiatal hernia presented to ED from home after fall when right leg slipped. Chronic left hip pain from prostate cancer.   Hospital Course:  Patient was evaluated for questionable lower extremity weakness.  No focal deficits were documented by physicians who cared for the patient and patient denied specific leg weakness that was new.  TIA was considered but ruled out.  No evidence of neurologic event.  MRI brain showed artifact.  Echocardiogram bilateral carotid ultrasounds were unremarkable.  Patient was seen by physical therapy with recommendation for home health.  He was also treated for AKI with gradual clinical improvement.  AKI secondary to poor variable intake at home.  Fall at home secondary to slip.  EDP and admitting physician exams--no focal weakness --pt denied right leg weakness > foot just slipped. Has chronic left hip pain and weakness from prostate cancer. --MRI brain negative. Discussed w/ Dr. Cheral Marker who reviewed, findings are artifact. No evidence of stroke. This correlates w/ history and exam performed.  No reason to suspect TIA or stroke. No further evaluation recommended. --echo done, reassuring --bilateral carotid u/s unremarkable  AKI --Resolving rapidly with IV fluids.  Patient tolerating a diet here without vomiting.  Expect spontaneous resolution at this point.  FTT, 30lb weight loss secondary to complicated hiatal hernia, chronic n/v. PMH Nissen fundoplication. --per outpatient documentation, surgery stated there are no options. Pt reports he can sometimes eat well when food pleases him --continue Pepcid --outpatient follow-up --No vomiting as an inpatient  Metastatic prostate cancer with progression despite Depo-Lupron. --follow-up as an outpatient   Today's assessment: S: feels better, no complaints; ate fairly well yesterday; no n/v O: Vitals:  Vitals:   10/29/19 0537 10/29/19 1319  BP: (!) 133/91 (!) 155/90  Pulse: 78 91  Resp: 16 16  Temp: 98.7 F (37.1 C) 98.1 F (36.7 C)  SpO2: 95% 97%    Constitutional:  . Appears calm and comfortable Respiratory:  . CTA bilaterally, no w/r/r.  . Respiratory effort normal. No retractions or accessory muscle use Cardiovascular:  . RRR, no m/r/g . No LE extremity edema   Psychiatric:  . Mental status o Mood, affect appropriate  Creatinine 1.63 > 1.31 Hgb stable 10.6  Discharge Instructions  Discharge Instructions    Diet - low sodium heart healthy   Complete  by: As directed    Discharge instructions   Complete by: As directed    Call your physician or seek immediate medical attention for pain, falls, weakness, vomiting or worsening of condition.   Increase activity slowly   Complete by: As  directed      Allergies as of 10/29/2019      Reactions   Aspirin Other (See Comments)   Ulcerated stomach   Azithromycin Shortness Of Breath   Cefuroxime Axetil Shortness Of Breath   Iodinated Diagnostic Agents Anaphylaxis   Iodine Other (See Comments)   Per pt tremors and dyspnea   Other Other (See Comments)   Twist in esophagus so patient can not take large pills/capsules   Naproxen Other (See Comments)    upset stomach - like with other NSAIDs   Penicillins Rash   Can take the shot, but not the pill 18 months ago      Medication List    STOP taking these medications   hydrOXYzine 25 MG capsule Commonly known as: VISTARIL     TAKE these medications   acetaminophen 500 MG tablet Commonly known as: TYLENOL Take 1,000 mg by mouth every 6 (six) hours as needed for mild pain or moderate pain.   amLODipine 5 MG tablet Commonly known as: NORVASC Take 1 tablet (5 mg total) by mouth daily.   diphenhydramine-acetaminophen 25-500 MG Tabs tablet Commonly known as: TYLENOL PM Take 2 tablets by mouth at bedtime as needed (sleep).   famotidine 40 MG tablet Commonly known as: Pepcid Take 1 tablet (40 mg total) by mouth daily.   loperamide 2 MG tablet Commonly known as: Imodium A-D Take 1-2 tablets (2-4 mg total) by mouth 3 (three) times daily as needed for diarrhea or loose stools.   losartan 100 MG tablet Commonly known as: COZAAR Take 0.5 tablets (50 mg total) by mouth daily.   LUPRON DEPOT-PED (13-MONTH) IM Inject into the muscle every 3 (three) months.   polyethylene glycol powder 17 GM/SCOOP powder Commonly known as: GLYCOLAX/MIRALAX Take 17 g by mouth 2 (two) times daily as needed for mild constipation or moderate constipation.   polyvinyl alcohol 1.4 % ophthalmic solution Commonly known as: LIQUIFILM TEARS Place 1 drop into both eyes daily as needed for dry eyes.   potassium chloride 10 MEQ tablet Commonly known as: KLOR-CON Take 1 tablet (10 mEq total) by  mouth daily.      Allergies  Allergen Reactions  . Aspirin Other (See Comments)    Ulcerated stomach  . Azithromycin Shortness Of Breath  . Cefuroxime Axetil Shortness Of Breath  . Iodinated Diagnostic Agents Anaphylaxis  . Iodine Other (See Comments)    Per pt tremors and dyspnea  . Other Other (See Comments)    Twist in esophagus so patient can not take large pills/capsules  . Naproxen Other (See Comments)     upset stomach - like with other NSAIDs  . Penicillins Rash    Can take the shot, but not the pill 18 months ago     The results of significant diagnostics from this hospitalization (including imaging, microbiology, ancillary and laboratory) are listed below for reference.    Significant Diagnostic Studies: CT Head Wo Contrast  Result Date: 10/27/2019 CLINICAL DATA:  Recent fall with headaches facial pain, initial encounter EXAM: CT HEAD WITHOUT CONTRAST TECHNIQUE: Contiguous axial images were obtained from the base of the skull through the vertex without intravenous contrast. COMPARISON:  None. FINDINGS: Brain: Chronic atrophic and ischemic changes are noted.  No findings to suggest acute hemorrhage, acute infarction or space-occupying mass lesion are noted. Vascular: No hyperdense vessel or unexpected calcification. Skull: Normal. Negative for fracture or focal lesion. Sinuses/Orbits: No acute finding. Other: None. IMPRESSION: Chronic atrophic and ischemic changes without acute abnormality. Electronically Signed   By: Inez Catalina M.D.   On: 10/27/2019 23:00   MR BRAIN WO CONTRAST  Result Date: 10/28/2019 CLINICAL DATA:  Transient ischemic attack (TIA). Additional history provided: Fall, significant weakness in right leg, difficulty standing EXAM: MRI HEAD WITHOUT CONTRAST TECHNIQUE: Multiplanar, multiecho pulse sequences of the brain and surrounding structures were obtained without intravenous contrast. COMPARISON:  Head CT 10/27/2019. FINDINGS: Brain: Mild intermittent  motion degradation. This includes mild motion degradation of the axial diffusion-weighted sequence. Moderate generalized parenchymal atrophy. There is an apparent 3 mm subtle focus of restricted diffusion within the paramedian right pons (series 9, image 77). This finding is only appreciated on the axial diffusion-weighted imaging and not appreciated on the coronal diffusion-weighted sequence. There is no evidence of restricted diffusion elsewhere within the brain. Mild patchy T2/FLAIR hyperintensity within the cerebral white matter is nonspecific, but consistent with chronic small vessel ischemic disease. No evidence of intracranial mass. No chronic intracranial blood products. No extra-axial fluid collection. No midline shift. Vascular: Expected proximal arterial flow voids. Skull and upper cervical spine: No focal marrow lesion. C1-C2 ligamentous hypertrophy/pannus formation. Sinuses/Orbits: Visualized orbits show no acute finding. Mild ethmoid sinus mucosal thickening. Trace fluid within left mastoid air cells. IMPRESSION: Mildly motion degraded examination. Apparent 3 mm focus of restricted diffusion within the paramedian right pons. This finding is appreciated on the axial diffusion-weighted sequence only, and not appreciated on the coronal diffusion-weighted imaging. While this finding may reflect artifact, a small acute/early subacute infarct cannot be excluded at this site. Moderate generalized parenchymal atrophy with mild chronic small vessel ischemic disease. Mild ethmoid sinus mucosal thickening. Trace left mastoid effusion. Electronically Signed   By: Kellie Simmering DO   On: 10/28/2019 07:44   ECHOCARDIOGRAM COMPLETE  Result Date: 10/28/2019    ECHOCARDIOGRAM REPORT   Patient Name:   Jorge [name] Date of Exam: 10/28/2019 Medical Rec #:  [id number]        Height:       73.0 in Accession #:    [id number]       Weight:       158.0 lb Date of Birth:  04-14-1929        BSA:          1.947 m Patient Age:     89 years         BP:           172/94 mmHg Patient Gender: M                HR:           97 bpm. Exam Location:  Inpatient Procedure: 2D Echo, Cardiac Doppler and Color Doppler Indications:    TIA  History:        Patient has prior history of Echocardiogram examinations, most                 recent 06/06/2011. TIA, Signs/Symptoms:Chest Pain; Risk                 Factors:Hypertension. Metastatic cancer.  Sonographer:    Roseanna Rainbow RDCS Referring Phys: [id number] [address] Dakins PRATT  Sonographer Comments: Technically difficult study due to poor echo windows. Extremely difficult windows and thin body  habitus. IMPRESSIONS  1. Left ventricular ejection fraction, by estimation, is 60 to 65%. The left ventricle has normal function. The left ventricle has no regional wall motion abnormalities. Left ventricular diastolic parameters are consistent with Grade I diastolic dysfunction (impaired relaxation).  2. Right ventricular systolic function is normal. The right ventricular size is normal. Tricuspid regurgitation signal is inadequate for assessing PA pressure.  3. The mitral valve is grossly normal. No evidence of mitral valve regurgitation. No evidence of mitral stenosis.  4. The aortic valve is tricuspid. Aortic valve regurgitation is not visualized. Mild to moderate aortic valve sclerosis/calcification is present, without any evidence of aortic stenosis.  5. The inferior vena cava is normal in size with greater than 50% respiratory variability, suggesting right atrial pressure of 3 mmHg. Conclusion(s)/Recommendation(s): No intracardiac source of embolism detected on this transthoracic study. A transesophageal echocardiogram is recommended to exclude cardiac source of embolism if clinically indicated. FINDINGS  Left Ventricle: Left ventricular ejection fraction, by estimation, is 60 to 65%. The left ventricle has normal function. The left ventricle has no regional wall motion abnormalities. The left ventricular internal cavity size  was normal in size. There is  no left ventricular hypertrophy. Left ventricular diastolic parameters are consistent with Grade I diastolic dysfunction (impaired relaxation). Right Ventricle: The right ventricular size is normal. No increase in right ventricular wall thickness. Right ventricular systolic function is normal. Tricuspid regurgitation signal is inadequate for assessing PA pressure. Left Atrium: Left atrial size was normal in size. Right Atrium: Right atrial size was normal in size. Pericardium: A small pericardial effusion is present. The pericardial effusion is surrounding the apex. Presence of pericardial fat pad. Mitral Valve: The mitral valve is grossly normal. Mild mitral annular calcification. No evidence of mitral valve regurgitation. No evidence of mitral valve stenosis. MV peak gradient, 10.4 mmHg. The mean mitral valve gradient is 2.0 mmHg. Tricuspid Valve: The tricuspid valve is grossly normal. Tricuspid valve regurgitation is trivial. No evidence of tricuspid stenosis. Aortic Valve: The aortic valve is tricuspid. . There is mild thickening and mild calcification of the aortic valve. Aortic valve regurgitation is not visualized. Mild to moderate aortic valve sclerosis/calcification is present, without any evidence of aortic stenosis. There is mild thickening of the aortic valve. There is mild calcification of the aortic valve. Aortic valve mean gradient measures 5.0 mmHg. Aortic valve peak gradient measures 10.4 mmHg. Aortic valve area, by VTI measures 2.97 cm. Pulmonic Valve: The pulmonic valve was grossly normal. Pulmonic valve regurgitation is not visualized. No evidence of pulmonic stenosis. Aorta: The aortic root and ascending aorta are structurally normal, with no evidence of dilitation. Venous: The inferior vena cava is normal in size with greater than 50% respiratory variability, suggesting right atrial pressure of 3 mmHg. IAS/Shunts: The atrial septum is grossly normal.  LEFT  VENTRICLE PLAX 2D LVIDd:         3.54 cm     Diastology LVIDs:         2.27 cm     LV e' lateral:   7.51 cm/s LV PW:         1.71 cm     LV E/e' lateral: 11.5 LV IVS:        1.10 cm     LV e' medial:    7.94 cm/s LVOT diam:     2.20 cm     LV E/e' medial:  10.9 LV SV:         80 LV SV Index:  41 LVOT Area:     3.80 cm  LV Volumes (MOD) LV vol d, MOD A2C: 88.7 ml LV vol d, MOD A4C: 79.6 ml LV vol s, MOD A2C: 29.8 ml LV vol s, MOD A4C: 33.7 ml LV SV MOD A2C:     59.0 ml LV SV MOD A4C:     79.6 ml LV SV MOD BP:      53.3 ml RIGHT VENTRICLE             IVC RV S prime:     12.90 cm/s  IVC diam: 1.55 cm TAPSE (M-mode): 1.3 cm LEFT ATRIUM             Index       RIGHT ATRIUM           Index LA diam:        2.70 cm 1.39 cm/m  RA Area:     13.50 cm LA Vol (A2C):   55.1 ml 28.31 ml/m RA Volume:   30.40 ml  15.62 ml/m LA Vol (A4C):   38.0 ml 19.52 ml/m LA Biplane Vol: 45.6 ml 23.43 ml/m  AORTIC VALVE AV Area (Vmax):    2.86 cm AV Area (Vmean):   2.87 cm AV Area (VTI):     2.97 cm AV Vmax:           161.00 cm/s AV Vmean:          107.000 cm/s AV VTI:            0.269 m AV Peak Grad:      10.4 mmHg AV Mean Grad:      5.0 mmHg LVOT Vmax:         121.00 cm/s LVOT Vmean:        80.900 cm/s LVOT VTI:          0.210 m LVOT/AV VTI ratio: 0.78  AORTA Ao Root diam: 3.50 cm Ao Asc diam:  3.10 cm MITRAL VALVE MV Area (PHT): 3.53 cm     SHUNTS MV Peak grad:  10.4 mmHg    Systemic VTI:  0.21 m MV Mean grad:  2.0 mmHg     Systemic Diam: 2.20 cm MV Vmax:       1.61 m/s MV Vmean:      63.9 cm/s MV Decel Time: 215 msec MV E velocity: 86.50 cm/s MV A velocity: 135.00 cm/s MV E/A ratio:  0.64 Eleonore Chiquito MD Electronically signed by Eleonore Chiquito MD Signature Date/Time: 10/28/2019/4:14:41 PM    Final    DG HIP UNILAT WITH PELVIS 2-3 VIEWS RIGHT  Result Date: 10/28/2019 CLINICAL DATA:  Fall.  Metastatic prostate cancer EXAM: DG HIP (WITH OR WITHOUT PELVIS) 2-3V RIGHT COMPARISON:  CT 07/29/2019 FINDINGS: Extensive sclerotic  metastatic disease involving much of the left hemipelvis, the supra-acetabular aspect of the right ilium, as well as within the sacrum and L5 vertebral body. Remainder of the bones demonstrate demineralization. No acute fracture is identified. Partial visualization of a left-sided nephroureteral stent. IMPRESSION: 1. Extensive sclerotic metastatic disease. 2. No acute fracture or dislocation identified involving the right hip. Electronically Signed   By: Davina Poke D.O.   On: 10/28/2019 09:19   VAS US CAROTID (at [facility] and [facility] only)  Result Date: 10/28/2019 Carotid Arterial Duplex Study Indications:       CVA and Weakness. Risk Factors:      Hypertension. Other Factors:     Metastatic prostate cancer. Comparison Study:  No prior study on file  Performing Technologist: Sharion Dove RVS  Examination Guidelines: A complete evaluation includes B-mode imaging, spectral Doppler, color Doppler, and power Doppler as needed of all accessible portions of each vessel. Bilateral testing is considered an integral part of a complete examination. Limited examinations for reoccurring indications may be performed as noted.  Right Carotid Findings: +----------+--------+--------+--------+------------------+------------------+           PSV cm/sEDV cm/sStenosisPlaque DescriptionComments           +----------+--------+--------+--------+------------------+------------------+ CCA Prox  98      14                                intimal thickening +----------+--------+--------+--------+------------------+------------------+ CCA Distal65      14                                intimal thickening +----------+--------+--------+--------+------------------+------------------+ ICA Prox  58      19              heterogenous                         +----------+--------+--------+--------+------------------+------------------+ ICA Distal74      21                                                    +----------+--------+--------+--------+------------------+------------------+ ECA       108     12                                                   +----------+--------+--------+--------+------------------+------------------+ +----------+--------+-------+--------+-------------------+           PSV cm/sEDV cmsDescribeArm Pressure (mmHG) +----------+--------+-------+--------+-------------------+ [id number]                                         +----------+--------+-------+--------+-------------------+ +---------+--------+--+--------+--+ VertebralPSV cm/s57EDV cm/s15 +---------+--------+--+--------+--+  Left Carotid Findings: +----------+--------+--------+--------+------------------+------------------+           PSV cm/sEDV cm/sStenosisPlaque DescriptionComments           +----------+--------+--------+--------+------------------+------------------+ CCA Prox  87      11                                intimal thickening +----------+--------+--------+--------+------------------+------------------+ CCA Distal83      12                                intimal thickening +----------+--------+--------+--------+------------------+------------------+ ICA Prox  78      21              heterogenous                         +----------+--------+--------+--------+------------------+------------------+ ICA Distal78      18  tortuous           +----------+--------+--------+--------+------------------+------------------+ ECA       57      5                                                    +----------+--------+--------+--------+------------------+------------------+ +----------+--------+--------+--------+-------------------+           PSV cm/sEDV cm/sDescribeArm Pressure (mmHG) +----------+--------+--------+--------+-------------------+ [id number]                                          +----------+--------+--------+--------+-------------------+ +---------+--------+--+--------+--+ VertebralPSV cm/s55EDV cm/s12 +---------+--------+--+--------+--+   Summary: Right Carotid: The extracranial vessels were near-normal with only minimal wall                thickening or plaque. Left Carotid: The extracranial vessels were near-normal with only minimal wall               thickening or plaque. Vertebrals:  Bilateral vertebral arteries demonstrate antegrade flow. Subclavians: Normal flow hemodynamics were seen in bilateral subclavian              arteries. *See table(s) above for measurements and observations.  Electronically signed by Antony Contras MD on 10/28/2019 at 12:49:23 PM.    Final     Microbiology: Recent Results (from the past 240 hour(s))  SARS Coronavirus 2 by RT PCR (hospital order, performed in [facility] hospital lab) Nasopharyngeal Nasopharyngeal Swab     Status: None   Collection Time: 10/28/19 12:15 AM   Specimen: Nasopharyngeal Swab  Result Value Ref Range Status   SARS Coronavirus 2 NEGATIVE NEGATIVE Final    Comment: (NOTE) SARS-CoV-2 target nucleic acids are NOT DETECTED.  The SARS-CoV-2 RNA is generally detectable in upper and lower respiratory specimens during the acute phase of infection. The lowest concentration of SARS-CoV-2 viral copies this assay can detect is 250 copies / mL. A negative result does not preclude SARS-CoV-2 infection and should not be used as the sole basis for treatment or other patient management decisions.  A negative result may occur with improper specimen collection / handling, submission of specimen other than nasopharyngeal swab, presence of viral mutation(s) within the areas targeted by this assay, and inadequate number of viral copies (<250 copies / mL). A negative result must be combined with clinical observations, patient history, and epidemiological information.  Fact Sheet for Patients:     StrictlyIdeas.no  Fact Sheet for Healthcare Providers: BankingDealers.co.za  This test is not yet approved or  cleared by the [place] FDA and has been authorized for detection and/or diagnosis of SARS-CoV-2 by FDA under an Emergency Use Authorization (EUA).  This EUA will remain in effect (meaning this test can be used) for the duration of the COVID-19 declaration under Section 564(b)(1) of the Act, 21 U.S.C. section 360bbb-3(b)(1), unless the authorization is terminated or revoked sooner.  Performed at [facility], [place] [address] [place], [place] [address]      Labs: Basic Metabolic Panel: Recent Labs  Lab 10/27/19 2138 10/28/19 0327 10/29/19 0442  NA 144 142 143  K 4.5 3.7 3.7  CL 108 107 108  CO2 25 26 25   GLUCOSE 118* 131* 117*  BUN 27* 23 20  CREATININE 1.72* 1.63* 1.31*  CALCIUM 8.9 8.7* 8.6*   CBC: Recent Labs  Lab 10/27/19 2138 10/28/19 0327 10/29/19 0807  WBC 9.8 8.6 9.3  HGB 11.0* 9.8* 10.6*  HCT 35.7* 31.9* 34.6*  MCV 97.8 98.5 100.0  PLT 402* 351 369    CBG: Recent Labs  Lab 10/27/19 2130  GLUCAP 106*    Principal Problem:   AKI (acute kidney injury) ([place]) Active Problems:   Vitamin D deficiency   HTN (hypertension)   GERD   Weight loss   Diarrhea   Large hiatal hernia   Prostate cancer metastatic to bone [facility])   Hydronephrosis of left kidney   Right leg weakness   Fall at home, initial encounter   Time coordinating discharge: 25 minutes  Signed:  Murray Hodgkins, MD  [facility]  10/30/2019, 1:22 PM

## 2019-11-01 ENCOUNTER — Telehealth: Payer: Self-pay

## 2019-11-01 DIAGNOSIS — C61 Malignant neoplasm of prostate: Secondary | ICD-10-CM | POA: Diagnosis not present

## 2019-11-01 DIAGNOSIS — K222 Esophageal obstruction: Secondary | ICD-10-CM | POA: Diagnosis not present

## 2019-11-01 DIAGNOSIS — N39498 Other specified urinary incontinence: Secondary | ICD-10-CM | POA: Diagnosis not present

## 2019-11-01 DIAGNOSIS — R627 Adult failure to thrive: Secondary | ICD-10-CM | POA: Diagnosis not present

## 2019-11-01 DIAGNOSIS — K219 Gastro-esophageal reflux disease without esophagitis: Secondary | ICD-10-CM | POA: Diagnosis not present

## 2019-11-01 DIAGNOSIS — D630 Anemia in neoplastic disease: Secondary | ICD-10-CM | POA: Diagnosis not present

## 2019-11-01 DIAGNOSIS — N179 Acute kidney failure, unspecified: Secondary | ICD-10-CM | POA: Diagnosis not present

## 2019-11-01 DIAGNOSIS — G4700 Insomnia, unspecified: Secondary | ICD-10-CM | POA: Diagnosis not present

## 2019-11-01 DIAGNOSIS — N401 Enlarged prostate with lower urinary tract symptoms: Secondary | ICD-10-CM | POA: Diagnosis not present

## 2019-11-01 DIAGNOSIS — F419 Anxiety disorder, unspecified: Secondary | ICD-10-CM | POA: Diagnosis not present

## 2019-11-01 DIAGNOSIS — C7951 Secondary malignant neoplasm of bone: Secondary | ICD-10-CM | POA: Diagnosis not present

## 2019-11-01 DIAGNOSIS — Z6820 Body mass index (BMI) 20.0-20.9, adult: Secondary | ICD-10-CM | POA: Diagnosis not present

## 2019-11-01 DIAGNOSIS — I10 Essential (primary) hypertension: Secondary | ICD-10-CM | POA: Diagnosis not present

## 2019-11-01 DIAGNOSIS — I440 Atrioventricular block, first degree: Secondary | ICD-10-CM | POA: Diagnosis not present

## 2019-11-01 DIAGNOSIS — K449 Diaphragmatic hernia without obstruction or gangrene: Secondary | ICD-10-CM | POA: Diagnosis not present

## 2019-11-01 DIAGNOSIS — R634 Abnormal weight loss: Secondary | ICD-10-CM | POA: Diagnosis not present

## 2019-11-01 NOTE — Telephone Encounter (Signed)
Herbert Deaner states that the patient was recently discharged from the [facility]. Evaluation for home health was completed today. Requesting verbal orders for the following:  Physcial Therapy 1x a week for 1 week 2x a week for 4 weeks 1x a week for 4 weeks   Referral to Occupational Therapy for ADLs Referral to Speech Therapy for intermittent choking.  BPs from today's visit: 140/80 seated 120/70 standing; reports no symptoms  Reports intermittent chronic incontinence of bowels. Chronic thirst. Today had an episode of nausea and vomitting.  In the last 6 month, patient has lost 30 pound, not intentionally.

## 2019-11-01 NOTE — Telephone Encounter (Signed)
F/u   Clair Gulling calling back with medication concerns    potassium chloride (KLOR-CON) 10 MEQ tablet the patient does not have this medication does not know he was supposed to be taken this medication   hydroxyzine 25 mg d/c instruction to discontinue medication and patient has been taken medication.   Would benefit from been seen early than 8.30.21

## 2019-11-04 NOTE — Telephone Encounter (Signed)
Need ok for verbal orders. Also pt was recently discharged from ED and confused about meds. Would you like to see pt before scheduled 8/30 appointment?

## 2019-11-05 NOTE — Telephone Encounter (Signed)
Aaliyah, I should see him within 2 weeks after his discharge date. Thanks, AP

## 2019-11-05 NOTE — Telephone Encounter (Signed)
Pt is scheduled for 11/11/2019 at 10a.   Also, ok for Physical Therapy? 1x a week for 1 week 2x a week for 4 weeks 1x a week for 4 weeks  Pt needs Referral to Occupational Therapy for ADLs And Referral to Speech Therapy for intermittent choking

## 2019-11-06 ENCOUNTER — Telehealth: Payer: Self-pay

## 2019-11-06 NOTE — Telephone Encounter (Signed)
OK. Thx

## 2019-11-06 NOTE — Telephone Encounter (Signed)
Left vm with PT ok.  PT NEEDS REFERRAL FOR OCCUPATIONAL THERAPY AND SPEECH THERAPY

## 2019-11-06 NOTE — Telephone Encounter (Signed)
New message  [facility]  The patient was not feeling well and did not want to do therapy.  Clair Gulling encourages the nurse to follow up.

## 2019-11-07 NOTE — Telephone Encounter (Signed)
Noted. Thanks.

## 2019-11-08 ENCOUNTER — Telehealth: Payer: Self-pay | Admitting: Nurse Practitioner

## 2019-11-08 NOTE — Telephone Encounter (Signed)
Called per 8/6 sch msg - pt did not want appt - message sent to LT to see when we can reschedule.

## 2019-11-11 ENCOUNTER — Encounter: Payer: Self-pay | Admitting: Internal Medicine

## 2019-11-11 ENCOUNTER — Ambulatory Visit: Payer: Medicare Other | Admitting: Internal Medicine

## 2019-11-11 ENCOUNTER — Other Ambulatory Visit: Payer: Self-pay

## 2019-11-11 VITALS — BP 120/70 | HR 116 | Temp 98.9°F | Ht 73.0 in | Wt 154.0 lb

## 2019-11-11 DIAGNOSIS — C7951 Secondary malignant neoplasm of bone: Secondary | ICD-10-CM

## 2019-11-11 DIAGNOSIS — G4700 Insomnia, unspecified: Secondary | ICD-10-CM

## 2019-11-11 DIAGNOSIS — G8929 Other chronic pain: Secondary | ICD-10-CM

## 2019-11-11 DIAGNOSIS — C61 Malignant neoplasm of prostate: Secondary | ICD-10-CM

## 2019-11-11 DIAGNOSIS — M545 Low back pain, unspecified: Secondary | ICD-10-CM

## 2019-11-11 DIAGNOSIS — R627 Adult failure to thrive: Secondary | ICD-10-CM | POA: Diagnosis not present

## 2019-11-11 DIAGNOSIS — I10 Essential (primary) hypertension: Secondary | ICD-10-CM

## 2019-11-11 MED ORDER — AMLODIPINE BESYLATE 2.5 MG PO TABS: 2.5000 mg | ORAL_TABLET | Freq: Every day | ORAL | 3 refills | Status: DC

## 2019-11-11 MED ORDER — HYDROXYZINE HCL 25 MG PO TABS: 12.5000 mg | ORAL_TABLET | Freq: Every evening | ORAL | 1 refills | Status: DC | PRN

## 2019-11-11 MED ORDER — LOSARTAN POTASSIUM 25 MG PO TABS: 25.0000 mg | ORAL_TABLET | Freq: Every day | ORAL | 3 refills | Status: DC

## 2019-11-11 NOTE — Assessment & Plan Note (Signed)
Low-normal BP after wt loss Reduce BP meds doses

## 2019-11-11 NOTE — Assessment & Plan Note (Signed)
Wt loss, weakness, falls. His brother died last week -  Almost 84 yo. Palliative care ref

## 2019-11-11 NOTE — Assessment & Plan Note (Signed)
Use a cane Wt loss, weakness, falls.

## 2019-11-11 NOTE — Assessment & Plan Note (Signed)
Stopped Hydroxyzine. Taking Tylenol pm - not sleeping - d/c. His brother died last week -  Almost 84 yo. Ok to re-start Hydroxyzine.

## 2019-11-11 NOTE — Progress Notes (Signed)
Subjective:  Patient ID: Jorge [name], male    DOB: [birth year]  Age: 86 y.o. MRN: [id number]  CC: No chief complaint on file.   HPI JERMY COUPER presents for a hospital stay f/u for a fall, FTT, wt loss. In PT at home. Stopped Hydroxyzine. Taking Tylenol pm - not sleeping. His brother died last week -  Almost 60 yo. Per hx:  "Admit date: 10/27/2019 Discharge date: 10/29/2019  Recommendations for Outpatient Follow-up:  1. Ongoing care for intermittent vomiting, FTT.       Follow-up Information        Deneice Wack, [name], MD. Schedule an appointment as soon as possible for a visit in 2 week(s).   Specialty: Internal Medicine Contact information: [place] [address] [phone]             Care, [facility] Follow up.   Specialty: [place] Why: to provide home health physical therapy Contact information: [place] [facility] [place] [address] [phone]                 Discharge Diagnoses: Principal diagnosis is #1 1. Fall at home secondary to slip 2. AKI 3. FTT, 30lb weight loss secondary to complicated hiatal hernia, chronic n/v. PMH Nissen fundoplication.  Discharge Condition: improved Disposition: home with HHPT  Diet recommendation: low sodium heart healthy      Filed Weights   10/27/19 2124 10/27/19 2136  Weight: 71.7 kg 71.7 kg    History of present illness:  29yom PMH metastatic prostate cancer, 30lb weight loss, GERD/hiatal hernia presented to ED from home after fall when right leg slipped. Chronic left hip pain from prostate cancer.  Hospital Course:  Patient was evaluated for questionable lower extremity weakness.  No focal deficits were documented by physicians who cared for the patient and patient denied specific leg weakness that was new.  TIA was considered but ruled out.  No evidence of neurologic event.  MRI brain showed artifact.  Echocardiogram  bilateral carotid ultrasounds were unremarkable.  Patient was seen by physical therapy with recommendation for home health.  He was also treated for AKI with gradual clinical improvement.  AKI secondary to poor variable intake at home.  Fall at home secondary to slip. EDP and admitting physician exams--no focal weakness --pt denied right leg weakness > foot just slipped. Has chronic left hip pain and weakness from prostate cancer. --MRI brain negative. Discussed w/ Dr. Cheral Marker who reviewed, findings are artifact. No evidence of stroke. This correlates w/ history and exam performed.  No reason to suspect TIA or stroke. No further evaluation recommended. --echo done, reassuring --bilateral carotid u/s unremarkable  AKI --Resolving rapidly with IV fluids.  Patient tolerating a diet here without vomiting.  Expect spontaneous resolution at this point.  FTT, 30lb weight loss secondary to complicated hiatal hernia, chronic n/v. PMH Nissen fundoplication. --per outpatient documentation, surgery stated there are no options. Pt reports he can sometimes eat well when food pleases him --continue Pepcid --outpatient follow-up --No vomiting as an inpatient  Metastatic prostate cancer with progression despite Depo-Lupron. --follow-up as an outpatient  Today's assessment: S: feels better, no complaints; ate fairly well yesterday; no n/v O: Vitals:      Vitals:   10/29/19 0537 10/29/19 1319  BP: (!) 133/91 (!) 155/90  Pulse: 78 91  Resp: 16 16  Temp: 98.7 F (37.1 C) 98.1 F (36.7 C)  SpO2: 95% 97%    Constitutional:  Appears calm and comfortable Respiratory:   CTA bilaterally, no w/r/r.   Respiratory effort normal. No retractions or accessory muscle use Cardiovascular:   RRR, no m/r/g  No LE extremity edema   Psychiatric:   Mental status ? Mood, affect appropriate  Creatinine 1.63 > 1.31 Hgb stable 10.6  Discharge Instructions      Discharge Instructions     Diet - low sodium heart healthy   Complete by: As directed    Discharge instructions   Complete by: As directed    Call your physician or seek immediate medical attention for pain, falls, weakness, vomiting or worsening of condition.   Increase activity slowly   Complete by: As directed    "  Outpatient Medications Prior to Visit  Medication Sig Dispense Refill  . acetaminophen (TYLENOL) 500 MG tablet Take 1,000 mg by mouth every 6 (six) hours as needed for mild pain or moderate pain.    [name] amLODipine (NORVASC) 5 MG tablet Take 1 tablet (5 mg total) by mouth daily. 90 tablet 3  . diphenhydramine-acetaminophen (TYLENOL PM) 25-500 MG TABS tablet Take 2 tablets by mouth at bedtime as needed (sleep).     . famotidine (PEPCID) 40 MG tablet Take 1 tablet (40 mg total) by mouth daily. 90 tablet 3  . Leuprolide Acetate, 3 Month, (LUPRON DEPOT-PED, 36-MONTH, IM) Inject into the muscle every 3 (three) months.    . loperamide (IMODIUM A-D) 2 MG tablet Take 1-2 tablets (2-4 mg total) by mouth 3 (three) times daily as needed for diarrhea or loose stools. 60 tablet 1  . losartan (COZAAR) 100 MG tablet Take 0.5 tablets (50 mg total) by mouth daily. 90 tablet 3  . polyethylene glycol powder (GLYCOLAX/MIRALAX) powder Take 17 g by mouth 2 (two) times daily as needed for mild constipation or moderate constipation. 500 g 3  . polyvinyl alcohol (LIQUIFILM TEARS) 1.4 % ophthalmic solution Place 1 drop into both eyes daily as needed for dry eyes.     . potassium chloride (KLOR-CON) 10 MEQ tablet Take 1 tablet (10 mEq total) by mouth daily. 90 tablet 3   No facility-administered medications prior to visit.    ROS: Review of Systems  Constitutional: Positive for fatigue. Negative for appetite change and unexpected weight change.  HENT: Negative for congestion, nosebleeds, sneezing, sore throat and trouble swallowing.   Eyes: Negative for itching and visual disturbance.  Respiratory: Negative for cough.     Cardiovascular: Negative for chest pain, palpitations and leg swelling.  Gastrointestinal: Negative for abdominal distention, blood in stool, diarrhea and nausea.  Genitourinary: Negative for frequency and hematuria.  Musculoskeletal: Positive for arthralgias and gait problem. Negative for back pain, joint swelling and neck pain.  Skin: Negative for rash.  Neurological: Positive for weakness and light-headedness. Negative for dizziness, tremors and speech difficulty.  Psychiatric/Behavioral: Positive for decreased concentration and sleep disturbance. Negative for agitation and dysphoric mood. The patient is not nervous/anxious.     Objective:  BP 120/70 (BP Location: Left Arm, Patient Position: Sitting, Cuff Size: Large)   Pulse (!) 116   Temp 98.9 F (37.2 C) (Oral)   Ht 6\' 1"  (1.854 m)   Wt 154 lb (69.9 kg)   SpO2 95%   BMI 20.32 kg/m   BP Readings from Last 3 Encounters:  11/11/19 120/70  10/29/19 (!) 155/90  10/21/19 (!) 180/94    Wt Readings from Last 3 Encounters:  11/11/19 154 lb (69.9 kg)  10/27/19 158 lb (71.7 kg)  10/21/19  158 lb (71.7 kg)    Physical Exam Constitutional:      General: He is not in acute distress.    Appearance: He is well-developed.     Comments: NAD  Eyes:     Conjunctiva/sclera: Conjunctivae normal.     Pupils: Pupils are equal, round, and reactive to light.  Neck:     Thyroid: No thyromegaly.     Vascular: No JVD.  Cardiovascular:     Rate and Rhythm: Normal rate and regular rhythm.     Heart sounds: Normal heart sounds. No murmur heard.  No friction rub. No gallop.   Pulmonary:     Effort: Pulmonary effort is normal. No respiratory distress.     Breath sounds: Normal breath sounds. No wheezing or rales.  Chest:     Chest wall: No tenderness.  Abdominal:     General: Bowel sounds are normal. There is no distension.     Palpations: Abdomen is soft. There is no mass.     Tenderness: There is no abdominal tenderness. There is no  guarding or rebound.  Musculoskeletal:        General: No tenderness. Normal range of motion.     Cervical back: Normal range of motion.  Lymphadenopathy:     Cervical: No cervical adenopathy.  Skin:    General: Skin is warm and dry.     Findings: No rash.  Neurological:     Mental Status: He is alert and oriented to person, place, and time.     Cranial Nerves: No cranial nerve deficit.     Motor: No abnormal muscle tone.     Coordination: Coordination normal.     Gait: Gait normal.     Deep Tendon Reflexes: Reflexes are normal and symmetric.  Psychiatric:        Behavior: Behavior normal.        Thought Content: Thought content normal.        Judgment: Judgment normal.     Lab Results  Component Value Date   WBC 9.3 10/29/2019   HGB 10.6 (L) 10/29/2019   HCT 34.6 (L) 10/29/2019   PLT 369 10/29/2019   GLUCOSE 117 (H) 10/29/2019   CHOL 101 10/28/2019   TRIG 71 10/28/2019   HDL 33 (L) 10/28/2019   LDLCALC 54 10/28/2019   ALT 13 08/29/2019   AST 27 08/29/2019   NA 143 10/29/2019   K 3.7 10/29/2019   CL 108 10/29/2019   CREATININE 1.31 (H) 10/29/2019   BUN 20 10/29/2019   CO2 25 10/29/2019   TSH 1.193 10/28/2019   PSA 0.46 01/10/2007   INR 0.9 04/09/2018   HGBA1C 5.9 (H) 10/28/2019    CT Head Wo Contrast  Result Date: 10/27/2019 CLINICAL DATA:  Recent fall with headaches facial pain, initial encounter EXAM: CT HEAD WITHOUT CONTRAST TECHNIQUE: Contiguous axial images were obtained from the base of the skull through the vertex without intravenous contrast. COMPARISON:  None. FINDINGS: Brain: Chronic atrophic and ischemic changes are noted. No findings to suggest acute hemorrhage, acute infarction or space-occupying mass lesion are noted. Vascular: No hyperdense vessel or unexpected calcification. Skull: Normal. Negative for fracture or focal lesion. Sinuses/Orbits: No acute finding. Other: None. IMPRESSION: Chronic atrophic and ischemic changes without acute abnormality.  Electronically Signed   By: Inez Catalina M.D.   On: 10/27/2019 23:00   MR BRAIN WO CONTRAST  Result Date: 10/28/2019 CLINICAL DATA:  Transient ischemic attack (TIA). Additional history provided: Fall, significant weakness in right  leg, difficulty standing EXAM: MRI HEAD WITHOUT CONTRAST TECHNIQUE: Multiplanar, multiecho pulse sequences of the brain and surrounding structures were obtained without intravenous contrast. COMPARISON:  Head CT 10/27/2019. FINDINGS: Brain: Mild intermittent motion degradation. This includes mild motion degradation of the axial diffusion-weighted sequence. Moderate generalized parenchymal atrophy. There is an apparent 3 mm subtle focus of restricted diffusion within the paramedian right pons (series 9, image 77). This finding is only appreciated on the axial diffusion-weighted imaging and not appreciated on the coronal diffusion-weighted sequence. There is no evidence of restricted diffusion elsewhere within the brain. Mild patchy T2/FLAIR hyperintensity within the cerebral white matter is nonspecific, but consistent with chronic small vessel ischemic disease. No evidence of intracranial mass. No chronic intracranial blood products. No extra-axial fluid collection. No midline shift. Vascular: Expected proximal arterial flow voids. Skull and upper cervical spine: No focal marrow lesion. C1-C2 ligamentous hypertrophy/pannus formation. Sinuses/Orbits: Visualized orbits show no acute finding. Mild ethmoid sinus mucosal thickening. Trace fluid within left mastoid air cells. IMPRESSION: Mildly motion degraded examination. Apparent 3 mm focus of restricted diffusion within the paramedian right pons. This finding is appreciated on the axial diffusion-weighted sequence only, and not appreciated on the coronal diffusion-weighted imaging. While this finding may reflect artifact, a small acute/early subacute infarct cannot be excluded at this site. Moderate generalized parenchymal atrophy with  mild chronic small vessel ischemic disease. Mild ethmoid sinus mucosal thickening. Trace left mastoid effusion. Electronically Signed   By: Kellie Simmering DO   On: 10/28/2019 07:44   ECHOCARDIOGRAM COMPLETE  Result Date: 10/28/2019    ECHOCARDIOGRAM REPORT   Patient Name:   VIDAL LAMPKINS Date of Exam: 10/28/2019 Medical Rec #:  [id number]        Height:       73.0 in Accession #:    [id number]       Weight:       158.0 lb Date of Birth:  04/20/1929        BSA:          1.947 m Patient Age:    72 years         BP:           172/94 mmHg Patient Gender: M                HR:           97 bpm. Exam Location:  Inpatient Procedure: 2D Echo, Cardiac Doppler and Color Doppler Indications:    TIA  History:        Patient has prior history of Echocardiogram examinations, most                 recent 06/06/2011. TIA, Signs/Symptoms:Chest Pain; Risk                 Factors:Hypertension. Metastatic cancer.  Sonographer:    Roseanna Rainbow RDCS Referring Phys: [id number] Standley Dakins PRATT  Sonographer Comments: Technically difficult study due to poor echo windows. Extremely difficult windows and thin body habitus. IMPRESSIONS  1. Left ventricular ejection fraction, by estimation, is 60 to 65%. The left ventricle has normal function. The left ventricle has no regional wall motion abnormalities. Left ventricular diastolic parameters are consistent with Grade I diastolic dysfunction (impaired relaxation).  2. Right ventricular systolic function is normal. The right ventricular size is normal. Tricuspid regurgitation signal is inadequate for assessing PA pressure.  3. The mitral valve is grossly normal. No evidence of mitral valve regurgitation. No evidence of mitral stenosis.  4. The aortic valve is tricuspid. Aortic valve regurgitation is not visualized. Mild to moderate aortic valve sclerosis/calcification is present, without any evidence of aortic stenosis.  5. The inferior vena cava is normal in size with greater than 50% respiratory  variability, suggesting right atrial pressure of 3 mmHg. Conclusion(s)/Recommendation(s): No intracardiac source of embolism detected on this transthoracic study. A transesophageal echocardiogram is recommended to exclude cardiac source of embolism if clinically indicated. FINDINGS  Left Ventricle: Left ventricular ejection fraction, by estimation, is 60 to 65%. The left ventricle has normal function. The left ventricle has no regional wall motion abnormalities. The left ventricular internal cavity size was normal in size. There is  no left ventricular hypertrophy. Left ventricular diastolic parameters are consistent with Grade I diastolic dysfunction (impaired relaxation). Right Ventricle: The right ventricular size is normal. No increase in right ventricular wall thickness. Right ventricular systolic function is normal. Tricuspid regurgitation signal is inadequate for assessing PA pressure. Left Atrium: Left atrial size was normal in size. Right Atrium: Right atrial size was normal in size. Pericardium: A small pericardial effusion is present. The pericardial effusion is surrounding the apex. Presence of pericardial fat pad. Mitral Valve: The mitral valve is grossly normal. Mild mitral annular calcification. No evidence of mitral valve regurgitation. No evidence of mitral valve stenosis. MV peak gradient, 10.4 mmHg. The mean mitral valve gradient is 2.0 mmHg. Tricuspid Valve: The tricuspid valve is grossly normal. Tricuspid valve regurgitation is trivial. No evidence of tricuspid stenosis. Aortic Valve: The aortic valve is tricuspid. . There is mild thickening and mild calcification of the aortic valve. Aortic valve regurgitation is not visualized. Mild to moderate aortic valve sclerosis/calcification is present, without any evidence of aortic stenosis. There is mild thickening of the aortic valve. There is mild calcification of the aortic valve. Aortic valve mean gradient measures 5.0 mmHg. Aortic valve peak  gradient measures 10.4 mmHg. Aortic valve area, by VTI measures 2.97 cm. Pulmonic Valve: The pulmonic valve was grossly normal. Pulmonic valve regurgitation is not visualized. No evidence of pulmonic stenosis. Aorta: The aortic root and ascending aorta are structurally normal, with no evidence of dilitation. Venous: The inferior vena cava is normal in size with greater than 50% respiratory variability, suggesting right atrial pressure of 3 mmHg. IAS/Shunts: The atrial septum is grossly normal.  LEFT VENTRICLE PLAX 2D LVIDd:         3.54 cm     Diastology LVIDs:         2.27 cm     LV e' lateral:   7.51 cm/s LV PW:         1.71 cm     LV E/e' lateral: 11.5 LV IVS:        1.10 cm     LV e' medial:    7.94 cm/s LVOT diam:     2.20 cm     LV E/e' medial:  10.9 LV SV:         80 LV SV Index:   41 LVOT Area:     3.80 cm  LV Volumes (MOD) LV vol d, MOD A2C: 88.7 ml LV vol d, MOD A4C: 79.6 ml LV vol s, MOD A2C: 29.8 ml LV vol s, MOD A4C: 33.7 ml LV SV MOD A2C:     59.0 ml LV SV MOD A4C:     79.6 ml LV SV MOD BP:      53.3 ml RIGHT VENTRICLE  IVC RV S prime:     12.90 cm/s  IVC diam: 1.55 cm TAPSE (M-mode): 1.3 cm LEFT ATRIUM             Index       RIGHT ATRIUM           Index LA diam:        2.70 cm 1.39 cm/m  RA Area:     13.50 cm LA Vol (A2C):   55.1 ml 28.31 ml/m RA Volume:   30.40 ml  15.62 ml/m LA Vol (A4C):   38.0 ml 19.52 ml/m LA Biplane Vol: 45.6 ml 23.43 ml/m  AORTIC VALVE AV Area (Vmax):    2.86 cm AV Area (Vmean):   2.87 cm AV Area (VTI):     2.97 cm AV Vmax:           161.00 cm/s AV Vmean:          107.000 cm/s AV VTI:            0.269 m AV Peak Grad:      10.4 mmHg AV Mean Grad:      5.0 mmHg LVOT Vmax:         121.00 cm/s LVOT Vmean:        80.900 cm/s LVOT VTI:          0.210 m LVOT/AV VTI ratio: 0.78  AORTA Ao Root diam: 3.50 cm Ao Asc diam:  3.10 cm MITRAL VALVE MV Area (PHT): 3.53 cm     SHUNTS MV Peak grad:  10.4 mmHg    Systemic VTI:  0.21 m MV Mean grad:  2.0 mmHg     Systemic  Diam: 2.20 cm MV Vmax:       1.61 m/s MV Vmean:      63.9 cm/s MV Decel Time: 215 msec MV E velocity: 86.50 cm/s MV A velocity: 135.00 cm/s MV E/A ratio:  0.64 Eleonore Chiquito MD Electronically signed by Eleonore Chiquito MD Signature Date/Time: 10/28/2019/4:14:41 PM    Final    DG HIP UNILAT WITH PELVIS 2-3 VIEWS RIGHT  Result Date: 10/28/2019 CLINICAL DATA:  Fall.  Metastatic prostate cancer EXAM: DG HIP (WITH OR WITHOUT PELVIS) 2-3V RIGHT COMPARISON:  CT 07/29/2019 FINDINGS: Extensive sclerotic metastatic disease involving much of the left hemipelvis, the supra-acetabular aspect of the right ilium, as well as within the sacrum and L5 vertebral body. Remainder of the bones demonstrate demineralization. No acute fracture is identified. Partial visualization of a left-sided nephroureteral stent. IMPRESSION: 1. Extensive sclerotic metastatic disease. 2. No acute fracture or dislocation identified involving the right hip. Electronically Signed   By: Davina Poke D.O.   On: 10/28/2019 09:19   VAS US CAROTID (at [facility] and [facility] only)  Result Date: 10/28/2019 Carotid Arterial Duplex Study Indications:       CVA and Weakness. Risk Factors:      Hypertension. Other Factors:     Metastatic prostate cancer. Comparison Study:  No prior study on file Performing Technologist: Sharion Dove RVS  Examination Guidelines: A complete evaluation includes B-mode imaging, spectral Doppler, color Doppler, and power Doppler as needed of all accessible portions of each vessel. Bilateral testing is considered an integral part of a complete examination. Limited examinations for reoccurring indications may be performed as noted.  Right Carotid Findings: +----------+--------+--------+--------+------------------+------------------+           PSV cm/sEDV cm/sStenosisPlaque DescriptionComments           +----------+--------+--------+--------+------------------+------------------+ CCA Prox  98  14                                 intimal thickening +----------+--------+--------+--------+------------------+------------------+ CCA Distal65      14                                intimal thickening +----------+--------+--------+--------+------------------+------------------+ ICA Prox  58      19              heterogenous                         +----------+--------+--------+--------+------------------+------------------+ ICA Distal74      21                                                   +----------+--------+--------+--------+------------------+------------------+ ECA       108     12                                                   +----------+--------+--------+--------+------------------+------------------+ +----------+--------+-------+--------+-------------------+           PSV cm/sEDV cmsDescribeArm Pressure (mmHG) +----------+--------+-------+--------+-------------------+ [id number]                                         +----------+--------+-------+--------+-------------------+ +---------+--------+--+--------+--+ VertebralPSV cm/s57EDV cm/s15 +---------+--------+--+--------+--+  Left Carotid Findings: +----------+--------+--------+--------+------------------+------------------+           PSV cm/sEDV cm/sStenosisPlaque DescriptionComments           +----------+--------+--------+--------+------------------+------------------+ CCA Prox  87      11                                intimal thickening +----------+--------+--------+--------+------------------+------------------+ CCA Distal83      12                                intimal thickening +----------+--------+--------+--------+------------------+------------------+ ICA Prox  78      21              heterogenous                         +----------+--------+--------+--------+------------------+------------------+ ICA Distal78      18                                tortuous            +----------+--------+--------+--------+------------------+------------------+ ECA       57      5                                                    +----------+--------+--------+--------+------------------+------------------+ +----------+--------+--------+--------+-------------------+  PSV cm/sEDV cm/sDescribeArm Pressure (mmHG) +----------+--------+--------+--------+-------------------+ [id number]                                         +----------+--------+--------+--------+-------------------+ +---------+--------+--+--------+--+ VertebralPSV cm/s55EDV cm/s12 +---------+--------+--+--------+--+   Summary: Right Carotid: The extracranial vessels were near-normal with only minimal wall                thickening or plaque. Left Carotid: The extracranial vessels were near-normal with only minimal wall               thickening or plaque. Vertebrals:  Bilateral vertebral arteries demonstrate antegrade flow. Subclavians: Normal flow hemodynamics were seen in bilateral subclavian              arteries. *See table(s) above for measurements and observations.  Electronically signed by Antony Contras MD on 10/28/2019 at 12:49:23 PM.    Final     Assessment & Plan:   There are no diagnoses linked to this encounter.   No orders of the defined types were placed in this encounter.    Follow-up: No follow-ups on file.  [name], MD

## 2019-11-11 NOTE — Assessment & Plan Note (Addendum)
Palliative care ref Wt loss, weakness, falls.

## 2019-11-12 ENCOUNTER — Telehealth: Payer: Self-pay | Admitting: Internal Medicine

## 2019-11-12 DIAGNOSIS — C61 Malignant neoplasm of prostate: Secondary | ICD-10-CM

## 2019-11-12 NOTE — Telephone Encounter (Signed)
New message    Jorge [name] calling from [place] home has a question regarding potassium chloride (KLOR-CON) 10 MEQ tablet is on the med list not in the home.    Need clarification on whether or not he suppose to be taken.   A list of current mediation

## 2019-11-12 NOTE — Telephone Encounter (Signed)
Jorge [name] with [facility] called and want to inform Dr. Alain Marion that the patient fell last night. He fell backwards in his kitchen, he does not have any injuries just some soreness.

## 2019-11-14 ENCOUNTER — Telehealth: Payer: Self-pay | Admitting: Nurse Practitioner

## 2019-11-14 ENCOUNTER — Telehealth: Payer: Self-pay | Admitting: Internal Medicine

## 2019-11-14 MED ORDER — POTASSIUM CHLORIDE ER 10 MEQ PO TBCR: 10.0000 meq | EXTENDED_RELEASE_TABLET | Freq: Every day | ORAL | 3 refills | Status: DC

## 2019-11-14 NOTE — Telephone Encounter (Signed)
pls cont to take KCl Thx

## 2019-11-14 NOTE — Telephone Encounter (Signed)
New message:  Jorge [name] is calling from [place] to report these things about the pt. Please advise.   BP: Medication change and just got the new medication today and will start today. Pt took old dose on yesterday. BP sitting 120/80 and standing 94/80 and asymptomatic.   Sleep  Medication : hydrOXYzine (ATARAX/VISTARIL) 25 MG tablet and pt has two different versions of this medication. Pt is stating the medication is not helping with his sleep and would like to know what should he do to change this. Refill that was just given is different that first medication but still is not working for the patient.   Pt still has +2 ankle foot edema.

## 2019-11-14 NOTE — Telephone Encounter (Signed)
Called patient to offer to schedule a Palliative Consult, no answer - left message with reason for call along with my name and call back number. 

## 2019-11-15 MED ORDER — POTASSIUM CHLORIDE ER 10 MEQ PO TBCR: 10.0000 meq | EXTENDED_RELEASE_TABLET | Freq: Every day | ORAL | 3 refills | Status: DC

## 2019-11-15 NOTE — Telephone Encounter (Signed)
Jorge [name] states 2+ pitting edema remains bilat ankle & feet, pt unsure of when it started; has been requested of pt to report to Jorge [name] if noticed to be constant or intermittent.   Jorge [name] would also like to know if PCP feels there is a need for a home health referral.  Pt currently seeing speech & PT.   Please see previous call as well.  Pt asymptomatic with orthostatic hypotension.

## 2019-11-15 NOTE — Telephone Encounter (Signed)
LVM with patient informing him to continue taking potassium.  Also called Clair Gulling (with [facility]) to let him know that the prescription for potassium has been sent to the [facility] on file.  Fax number obtained to send current med list.

## 2019-11-15 NOTE — Telephone Encounter (Signed)
Done. Thank you.

## 2019-11-17 MED ORDER — HYDROXYZINE HCL 25 MG PO TABS: 25.0000 mg | ORAL_TABLET | Freq: Every evening | ORAL | 1 refills | Status: AC | PRN

## 2019-11-17 NOTE — Addendum Note (Signed)
Addended by: Cassandria Anger on: 11/17/2019 09:40 AM   Modules accepted: Orders

## 2019-11-17 NOTE — Telephone Encounter (Signed)
Please ask [facility] to send in RN to see [name]. Try hydroxyzine 25-50 mg at at bedtime as needed. Use compression socks (over-the-counter support knee-high's) on in the morning off at night.  Try to increase protein intake. Elevate legs/feet. Thanks

## 2019-11-18 NOTE — Telephone Encounter (Signed)
Ok Thx 

## 2019-11-18 NOTE — Telephone Encounter (Signed)
[facility] nurse states that pt says hydroxyzine is not working and would like to use a Tylenol PM. Please advise

## 2019-11-19 NOTE — Telephone Encounter (Signed)
Called [facility] nurse and informed him of below

## 2019-11-20 ENCOUNTER — Ambulatory Visit: Payer: Medicare Other | Admitting: Nurse Practitioner

## 2019-11-21 ENCOUNTER — Telehealth: Payer: Self-pay | Admitting: Internal Medicine

## 2019-11-21 NOTE — Telephone Encounter (Signed)
Need verbal orders for:  Nursing order once weekly for five weeks Disease and nutritional management Is it possible to try mirtazatine for poor apetite and sleep?   [phone] [employer]

## 2019-11-22 ENCOUNTER — Telehealth: Payer: Self-pay | Admitting: Internal Medicine

## 2019-11-22 NOTE — Telephone Encounter (Signed)
F/u    Jorge [name] from [place] calling benefit has agreed to OT reason - ADL

## 2019-11-22 NOTE — Telephone Encounter (Signed)
    Clair Gulling from [facility] calling to patient had not had a bowel movement in 3 days, then today patient had loose tools, incontinent Clair Gulling [phone]

## 2019-11-25 DIAGNOSIS — R55 Syncope and collapse: Secondary | ICD-10-CM

## 2019-11-25 DIAGNOSIS — Z9841 Cataract extraction status, right eye: Secondary | ICD-10-CM

## 2019-11-25 DIAGNOSIS — Z9181 History of falling: Secondary | ICD-10-CM

## 2019-11-25 DIAGNOSIS — G4700 Insomnia, unspecified: Secondary | ICD-10-CM

## 2019-11-25 DIAGNOSIS — N529 Male erectile dysfunction, unspecified: Secondary | ICD-10-CM

## 2019-11-25 DIAGNOSIS — Z9842 Cataract extraction status, left eye: Secondary | ICD-10-CM

## 2019-11-25 DIAGNOSIS — Z961 Presence of intraocular lens: Secondary | ICD-10-CM

## 2019-11-25 DIAGNOSIS — R634 Abnormal weight loss: Secondary | ICD-10-CM

## 2019-11-25 DIAGNOSIS — I440 Atrioventricular block, first degree: Secondary | ICD-10-CM

## 2019-11-25 DIAGNOSIS — R197 Diarrhea, unspecified: Secondary | ICD-10-CM

## 2019-11-25 DIAGNOSIS — Z6820 Body mass index (BMI) 20.0-20.9, adult: Secondary | ICD-10-CM

## 2019-11-25 DIAGNOSIS — N1330 Unspecified hydronephrosis: Secondary | ICD-10-CM

## 2019-11-25 DIAGNOSIS — K219 Gastro-esophageal reflux disease without esophagitis: Secondary | ICD-10-CM

## 2019-11-25 DIAGNOSIS — C61 Malignant neoplasm of prostate: Secondary | ICD-10-CM | POA: Diagnosis not present

## 2019-11-25 DIAGNOSIS — Z923 Personal history of irradiation: Secondary | ICD-10-CM

## 2019-11-25 DIAGNOSIS — M170 Bilateral primary osteoarthritis of knee: Secondary | ICD-10-CM

## 2019-11-25 DIAGNOSIS — N39498 Other specified urinary incontinence: Secondary | ICD-10-CM

## 2019-11-25 DIAGNOSIS — D630 Anemia in neoplastic disease: Secondary | ICD-10-CM | POA: Diagnosis not present

## 2019-11-25 DIAGNOSIS — F419 Anxiety disorder, unspecified: Secondary | ICD-10-CM

## 2019-11-25 DIAGNOSIS — Z602 Problems related to living alone: Secondary | ICD-10-CM

## 2019-11-25 DIAGNOSIS — K222 Esophageal obstruction: Secondary | ICD-10-CM

## 2019-11-25 DIAGNOSIS — Z960 Presence of urogenital implants: Secondary | ICD-10-CM

## 2019-11-25 DIAGNOSIS — K449 Diaphragmatic hernia without obstruction or gangrene: Secondary | ICD-10-CM

## 2019-11-25 DIAGNOSIS — C7951 Secondary malignant neoplasm of bone: Secondary | ICD-10-CM | POA: Diagnosis not present

## 2019-11-25 DIAGNOSIS — I10 Essential (primary) hypertension: Secondary | ICD-10-CM

## 2019-11-25 DIAGNOSIS — Z86010 Personal history of colonic polyps: Secondary | ICD-10-CM

## 2019-11-25 DIAGNOSIS — E559 Vitamin D deficiency, unspecified: Secondary | ICD-10-CM

## 2019-11-25 DIAGNOSIS — R627 Adult failure to thrive: Secondary | ICD-10-CM | POA: Diagnosis not present

## 2019-11-25 DIAGNOSIS — N401 Enlarged prostate with lower urinary tract symptoms: Secondary | ICD-10-CM

## 2019-11-25 DIAGNOSIS — Z9079 Acquired absence of other genital organ(s): Secondary | ICD-10-CM

## 2019-11-25 DIAGNOSIS — N179 Acute kidney failure, unspecified: Secondary | ICD-10-CM

## 2019-11-25 MED ORDER — MIRTAZAPINE 15 MG PO TABS: 15.0000 mg | ORAL_TABLET | Freq: Every day | ORAL | 5 refills | Status: DC

## 2019-11-25 NOTE — Addendum Note (Signed)
Addended by: Cassandria Anger on: 11/25/2019 09:11 PM   Modules accepted: Orders

## 2019-11-25 NOTE — Telephone Encounter (Addendum)
OK w/me. OK Mirtazapine  - will send a Rx Thx

## 2019-11-27 NOTE — Telephone Encounter (Signed)
Left Beth a detailed message of verbal orders

## 2019-11-28 ENCOUNTER — Telehealth: Payer: Self-pay

## 2019-11-28 NOTE — Telephone Encounter (Signed)
New message    The patient was not open to other assessments for evaluation decline bathroom / OT therapy.   The patient was access for modified independence to stand by assist

## 2019-11-28 NOTE — Telephone Encounter (Signed)
Ok Thx 

## 2019-11-29 NOTE — Telephone Encounter (Signed)
Noted. Thanks.

## 2019-12-02 ENCOUNTER — Ambulatory Visit: Payer: Medicare Other | Admitting: Internal Medicine

## 2019-12-04 DIAGNOSIS — C7951 Secondary malignant neoplasm of bone: Secondary | ICD-10-CM | POA: Diagnosis not present

## 2019-12-04 DIAGNOSIS — R627 Adult failure to thrive: Secondary | ICD-10-CM | POA: Diagnosis not present

## 2019-12-04 DIAGNOSIS — I440 Atrioventricular block, first degree: Secondary | ICD-10-CM | POA: Diagnosis not present

## 2019-12-04 DIAGNOSIS — R634 Abnormal weight loss: Secondary | ICD-10-CM | POA: Diagnosis not present

## 2019-12-04 DIAGNOSIS — K219 Gastro-esophageal reflux disease without esophagitis: Secondary | ICD-10-CM | POA: Diagnosis not present

## 2019-12-04 DIAGNOSIS — D630 Anemia in neoplastic disease: Secondary | ICD-10-CM | POA: Diagnosis not present

## 2019-12-04 DIAGNOSIS — I10 Essential (primary) hypertension: Secondary | ICD-10-CM | POA: Diagnosis not present

## 2019-12-04 DIAGNOSIS — N39498 Other specified urinary incontinence: Secondary | ICD-10-CM | POA: Diagnosis not present

## 2019-12-04 DIAGNOSIS — F419 Anxiety disorder, unspecified: Secondary | ICD-10-CM | POA: Diagnosis not present

## 2019-12-04 DIAGNOSIS — C61 Malignant neoplasm of prostate: Secondary | ICD-10-CM | POA: Diagnosis not present

## 2019-12-04 DIAGNOSIS — G4700 Insomnia, unspecified: Secondary | ICD-10-CM | POA: Diagnosis not present

## 2019-12-04 DIAGNOSIS — Z6820 Body mass index (BMI) 20.0-20.9, adult: Secondary | ICD-10-CM | POA: Diagnosis not present

## 2019-12-04 DIAGNOSIS — K222 Esophageal obstruction: Secondary | ICD-10-CM | POA: Diagnosis not present

## 2019-12-04 DIAGNOSIS — N401 Enlarged prostate with lower urinary tract symptoms: Secondary | ICD-10-CM | POA: Diagnosis not present

## 2019-12-04 DIAGNOSIS — N179 Acute kidney failure, unspecified: Secondary | ICD-10-CM | POA: Diagnosis not present

## 2019-12-04 DIAGNOSIS — K449 Diaphragmatic hernia without obstruction or gangrene: Secondary | ICD-10-CM | POA: Diagnosis not present

## 2019-12-10 ENCOUNTER — Telehealth: Payer: Self-pay | Admitting: Internal Medicine

## 2019-12-10 NOTE — Telephone Encounter (Signed)
Beth with [facility] called and said that there is a stage one pressure ulcer on his sacrum and she thinks he just needs some barrier cream and she also said that the pt's in take is not very good. She said that he may only get about 30 oz of fluid for the day, dry skin, weak, dry mucous membrane, his HR today was 100, she said that was high for him, and his BP was 102/64, she said that his BP is lower than it has been. She was wondering if there was anything that needed to be done.

## 2019-12-12 ENCOUNTER — Telehealth: Payer: Self-pay | Admitting: Nurse Practitioner

## 2019-12-12 ENCOUNTER — Telehealth: Payer: Self-pay | Admitting: *Deleted

## 2019-12-12 NOTE — Telephone Encounter (Signed)
Informed Beth of below and gave ok for wound care orders

## 2019-12-12 NOTE — Telephone Encounter (Signed)
Called to request to cancel his OV for 12/13/19. Not feeling well--too weak to come in. Attempted to call back-no answer. Scheduling message sent to reschedule and will attempt to call him again tomorrow.

## 2019-12-12 NOTE — Telephone Encounter (Signed)
Spoke with patient regarding Palliative services and he was in agreement with scheduling visit with NP.  I have scheduled an In-person Consult for 12/13/19 @ 3:30 PM.

## 2019-12-12 NOTE — Telephone Encounter (Signed)
Agree with pressure ulcer treatment. Please take Mr. Hamrick to ER if he is sick. Thanks

## 2019-12-13 ENCOUNTER — Inpatient Hospital Stay: Payer: Medicare Other | Admitting: Nurse Practitioner

## 2019-12-13 ENCOUNTER — Other Ambulatory Visit: Payer: Medicare Other | Admitting: Nurse Practitioner

## 2019-12-13 ENCOUNTER — Other Ambulatory Visit: Payer: Self-pay

## 2019-12-13 ENCOUNTER — Encounter: Payer: Self-pay | Admitting: Nurse Practitioner

## 2019-12-13 DIAGNOSIS — E43 Unspecified severe protein-calorie malnutrition: Secondary | ICD-10-CM

## 2019-12-13 DIAGNOSIS — Z515 Encounter for palliative care: Secondary | ICD-10-CM

## 2019-12-13 NOTE — Progress Notes (Signed)
[facility] Consult Note Telephone: [phone]  Fax: [phone]  PATIENT NAME: Jorge [name] [address] [place] [address] [id number] (home)  DOB: 1929/07/01 MRN: [id number]  PRIMARY CARE PROVIDER:    Cassandria Anger, MD,  [place] [address] [phone]  REFERRING PROVIDER:   Cassandria Anger, MD [address] [place],  [place] [address] [phone]  RESPONSIBLE PARTY:   Extended Emergency Contact Information Primary Emergency Contact: [name],  [address] [address] [place] Phone: [phone] Mobile Phone: [phone] Relation: Relative Secondary Emergency Contact: [name] Address: [place], [place] Mobile Phone: [phone] Relation: Friend  I met face to face with patient in home.  ASSESSMENT AND RECOMMENDATIONS:   1. Advance Care Planning/Goals of Care: Goals include to maximize quality of life and symptom management. Our advance care planning conversation included a discussion about:     The value and importance of advance care planning   Experiences with loved ones who have been seriously ill or have died   Exploration of personal, cultural or spiritual beliefs that might influence medical decisions   Exploration of goals of care in the event of a sudden injury or illness   Review and updating or creation of an  advance directive document . Visit consisted of building trust and discussions on Palliative care Medicine as specialized medical care for people living with serious illness, aimed at facilitating better quality of life through symptoms relief, assisting with advance care plan and establishing goals of care. Patient lives alone, he was by himself at the time of the visit.  Patient expressed appreciation for education provided on Palliative care and how that differs from  Hospice service. Palliative care will continue to provide support to patient, family and the medical team.   Goals of Care: Patient's goal of care is Function. Patient goal is to increase his meal/ nutrition so he can add weight and be strong enough to heal.  Advance Directive: After discussions on ramifications and implications of code status, patient elected to not be resuscitated in the event of cardiac or respiratory arrest. Patient report that he has paid for his funeral and his family is aware. Patient was made ware that code status will be regularly reviewed to ensure that it reflects patient wishes and that palliative care is here to support him irrespective of his decisions. DNR form was completed and given to patient to keep in a readily assessible place preferably on his fridge.  MOST form completed and signed, details include DNR, Full scope of treatment, antibiotics if indicated, IV fluids if indicated, feeding tube long-term if indicated.   2. Symptom Management:  Protein calorie malnutrition: Patient diagnosed with prostate cancer with mets to bone. Patient denied pain today. Has ongoing difficulty with oral intake due to inoperable hiatal hernia (was already operated on x 2). Patient report that he throws up when he eats especially solid foods. He however denied nausea. He report recent meals to be mostly fluids. He takes nutritional supplement Boost 3 cans a day. Patient report inability to take oral chemo due to ingestion issues. Expressed frustration about his health situation. He expressed feeling helpless as he is unable to take his chemo pills to cure his prostate cancer. Patient report thick saliva, report feeling of choking and difficulty of fluid passing  down his throat. Explained to patient that he needs to increase his fluid intake as dehydration can lead to thickened secretions. Patient encouraged to drink at least 4 bottles of 523m bottled water a day. Patient verbalized  understanding. Patient report loosing 2 of his teeth this week, report teeth just falling out without any trauma to mouth. Report about 38-40lbs weight loss in the last year, weighs 153lbs now down from baseline weight of 205lbs. On Remeron 15g daily.  Hypotension: BP today 95/60, HR 108. Patient on Amlodipine 2.513mPO daily and Losatan 252maily. Patient advised to hold taking Losartan for now. It is past 5pm now,I will reach out to his PCP on Monday to inform them of his condition. Patient denied chest pain, lightheadedness or SOB. Patient had a fall 2 days ago, denied any injury, report slipping, declined to provided details of fall. Did not go to ED for evaluation, saying he does not want to sit in ED all day and that ambulance fee is exorbitant. Safety precautions discussed, advised to change position slowly and use his walker when ambulating.   3. Follow up Palliative Care Visit: Palliative care will continue to follow for goals of care clarification and symptom management. Return 2 weeks or prn.  4. Family /Caregiver/Community Supports: Patient is a [place]sell[place]ife of 45 46ars died of breast cancer in [address]12-Mar-2002as remarried in 20112-Mar-2011t divorced now, patient has no children. Has a friend KayZigmund Danielo is very supportive and assist in taking him for his appointments. His sister-in-law DebHilda Bladesso assist in buying his groceries. His next door neighbor assist as needed.   5. Cognitive / Functional decline: Patient report increased weakness. He report missing his doctor's appointment today because of inability to walk to his car. His friend kayZigmund Daniels indisposed due to illness. Patient report taking sponge bath sitting on his commode and washing the best he can. He currently receives in-home PT and OT and has a nurse come out to his house from his PCP office to check his vitals once a week. Patient declined suggestion of personal care aide saying he can not afford paying for it.  I spent 90 minutes  providing this consultation, time includes time spent with patient/family, chart review, provider coordination, and documentation. More than 50% of the time in this consultation was spent coordinating communication.   HISTORY OF PRESENT ILLNESS:  ArnTRISTAN PROTO a [age]. year old male with multiple medical problems including Prostate cancer with mets to bone, hiatal hernia, GERD. Palliative Care was asked to follow this patient by consultation request of [name], AleEvie LacksD to help address advance care planning and goals of care. Palliative Care was asked to help address goals of care. This is an initial visit.  CODE STATUS: DNR  PPS: 50%  HOSPICE ELIGIBILITY/DIAGNOSIS: TBD  PAST MEDICAL HISTORY:  Past Medical History:  Diagnosis Date   Anemia    Anxiety    ED (erectile dysfunction)    First degree heart block    GERD (gastroesophageal reflux disease)    01-30-2019   per pt no issue since surgery 06/ 2012019/03/12Hernia, hiatal GI-- dr p. hung   [facility]current w/ gastric outlet obstruction 06/ 2012019-03-12p  open takedown/ gastrostomy tube;  last EGD 06-15-219 epic ,  large HH   History of colonic polyps    History of gastric ulcer 20003-11-2008w/ erosive esophagitis   History of  syncope    01/ 2011   per ED visit in epic ,  vasovagal epidose, negative work-up   Hx of radiation therapy    prostate cancer ,  completed 03/ 2011   Hydronephrosis of left kidney    Hydronephrosis, left    due to malignant lymphadenopathy-- treated with ureteral stent   Hypertension    Insomnia    Metastatic castration-resistant adenocarcinoma of prostate [facility]) urologist-- dr dahlstedt/  oncologist--- dr Benay Spice   incidental finding pt had suprapubic prostatectoy for BPH on  09-18-2006 found to have Gleason 7 prostate cancer;   completed radiation therapy 03/ 2011 for rising PSA;  recurrent w/ mets 06/ 2018   Schatzki's ring    Stricture of esophagus GI--- dr Mamie Nick. hung    (01-30-2019  per pt no issues since surgery 06/ 2019)   tortuous esophagus (noted in epic since 2008)  s/p  repair large [facility] 03/ 2013  recurrent w/ obstruction s/p takedown 06/ 2019   Urinary incontinence    Vitamin D deficiency    Wears dentures    fuller upper and partial lower    SOCIAL HX:  Social History   Tobacco Use   Smoking status: Never Smoker   Smokeless tobacco: Never Used  Substance Use Topics   Alcohol use: Not Currently   FAMILY HX:  Family History  Problem Relation Age of Onset   Pancreatic cancer Father    Hypertension Father    Cancer Father    Hypertension Other     ALLERGIES:  Allergies  Allergen Reactions   Aspirin Other (See Comments)    Ulcerated stomach   Azithromycin Shortness Of Breath   Cefuroxime Axetil Shortness Of Breath   Iodinated Diagnostic Agents Anaphylaxis   Iodine Other (See Comments)    Per pt tremors and dyspnea   Other Other (See Comments)    Twist in esophagus so patient can not take large pills/capsules   Naproxen Other (See Comments)     upset stomach - like with other NSAIDs   Penicillins Rash    Can take the shot, but not the pill 18 months ago      PERTINENT MEDICATIONS:  Outpatient Encounter Medications as of 12/13/2019  Medication Sig   acetaminophen (TYLENOL) 500 MG tablet Take 1,000 mg by mouth every 6 (six) hours as needed for mild pain or moderate pain.   amLODipine (NORVASC) 2.5 MG tablet Take 1 tablet (2.5 mg total) by mouth daily.   famotidine (PEPCID) 40 MG tablet Take 1 tablet (40 mg total) by mouth daily.   hydrOXYzine (ATARAX/VISTARIL) 25 MG tablet Take 1-2 tablets (25-50 mg total) by mouth at bedtime as needed (insomnia).   Leuprolide Acetate, 3 Month, (LUPRON DEPOT-PED, 53-MONTH, IM) Inject into the muscle every 3 (three) months.   loperamide (IMODIUM A-D) 2 MG tablet Take 1-2 tablets (2-4 mg total) by mouth 3 (three) times daily as needed for diarrhea or loose stools.   losartan  (COZAAR) 25 MG tablet Take 1 tablet (25 mg total) by mouth daily.   mirtazapine (REMERON) 15 MG tablet Take 1 tablet (15 mg total) by mouth daily.   polyethylene glycol powder (GLYCOLAX/MIRALAX) powder Take 17 g by mouth 2 (two) times daily as needed for mild constipation or moderate constipation.   polyvinyl alcohol (LIQUIFILM TEARS) 1.4 % ophthalmic solution Place 1 drop into both eyes daily as needed for dry eyes.    potassium chloride (KLOR-CON) 10 MEQ tablet Take 1 tablet (10 mEq total) by mouth  daily.   No facility-administered encounter medications on file as of 12/13/2019.    PHYSICAL EXAM / ROS:   Current and past weights: 153lbs, BMI 20.2kg/m2 General: NAD, frail appearing, thin, alert, oriented and coherent Cardiovascular: no edema, S1S2 normal  Pulmonary: no cough, no increased SOB, room air, lungs clear Abdomen:  denied constipation, continent of bowel GU: denies dysuria, incontinent of urine MSK:  no joint and ROM abnormalities, ambulatory Skin: no rashes or wounds noted on exposed skin. Neurological: Weakness, but otherwise nonfocal  Jari Favre, DNP, AGPCNP-BC

## 2019-12-16 DIAGNOSIS — R9721 Rising PSA following treatment for malignant neoplasm of prostate: Secondary | ICD-10-CM | POA: Diagnosis not present

## 2019-12-16 DIAGNOSIS — C61 Malignant neoplasm of prostate: Secondary | ICD-10-CM | POA: Diagnosis not present

## 2019-12-17 ENCOUNTER — Telehealth: Payer: Self-pay | Admitting: Internal Medicine

## 2019-12-17 ENCOUNTER — Telehealth: Payer: Self-pay | Admitting: *Deleted

## 2019-12-17 NOTE — Telephone Encounter (Addendum)
RN called that today is her last home visit w/him and wanted to provide an update. Still has very poor po intake-solid food or liquids. Intermittent dry heaves w/occasional production of mucous. Often has urge to have BM w/no results and then later will be incontinent. PCP is aware and are researching for a higher level of care for him. MD notified.

## 2019-12-17 NOTE — Telephone Encounter (Signed)
Today vital in normal limits  last week missed appt with dr Benay Spice Was encouraged to go to ed if feeling bad pt declined didn't see helpfulness of the ed continuing to have urges for bowel movement however unsuccessful however later has incontinent  Reduced eating and drinking Had fall approx 12/10/19 Ankle gave way and toe nails got caught in carpet  Due to weakness unable to get out to get toenails trimmed Having increased weakness needs assistance getting out of car and chair Considering placement at [facility] concern is they may not be able to provide care that is needed, request to bring in social work from company to provide information resources

## 2019-12-18 NOTE — Telephone Encounter (Signed)
FYI from [facility]

## 2019-12-18 NOTE — Telephone Encounter (Signed)
Noted. Thx.

## 2019-12-20 ENCOUNTER — Emergency Department (HOSPITAL_COMMUNITY): Payer: Medicare Other

## 2019-12-20 ENCOUNTER — Encounter (HOSPITAL_COMMUNITY): Payer: Self-pay | Admitting: Emergency Medicine

## 2019-12-20 ENCOUNTER — Inpatient Hospital Stay (HOSPITAL_COMMUNITY)
Admission: EM | Admit: 2019-12-20 | Discharge: 2019-12-25 | DRG: 683 | Disposition: A | Discharge status: Hospice | Payer: Medicare Other | Attending: Family Medicine | Admitting: Family Medicine

## 2019-12-20 ENCOUNTER — Other Ambulatory Visit: Payer: Self-pay

## 2019-12-20 DIAGNOSIS — K449 Diaphragmatic hernia without obstruction or gangrene: Secondary | ICD-10-CM | POA: Diagnosis present

## 2019-12-20 DIAGNOSIS — R1310 Dysphagia, unspecified: Secondary | ICD-10-CM | POA: Diagnosis present

## 2019-12-20 DIAGNOSIS — E559 Vitamin D deficiency, unspecified: Secondary | ICD-10-CM | POA: Diagnosis present

## 2019-12-20 DIAGNOSIS — E46 Unspecified protein-calorie malnutrition: Secondary | ICD-10-CM | POA: Diagnosis not present

## 2019-12-20 DIAGNOSIS — I10 Essential (primary) hypertension: Secondary | ICD-10-CM | POA: Diagnosis present

## 2019-12-20 DIAGNOSIS — R634 Abnormal weight loss: Secondary | ICD-10-CM | POA: Diagnosis present

## 2019-12-20 DIAGNOSIS — Z800 Family history of malignant neoplasm of digestive organs: Secondary | ICD-10-CM

## 2019-12-20 DIAGNOSIS — L89151 Pressure ulcer of sacral region, stage 1: Secondary | ICD-10-CM | POA: Diagnosis present

## 2019-12-20 DIAGNOSIS — K440 Diaphragmatic hernia with obstruction, without gangrene: Secondary | ICD-10-CM | POA: Diagnosis present

## 2019-12-20 DIAGNOSIS — N179 Acute kidney failure, unspecified: Principal | ICD-10-CM | POA: Diagnosis present

## 2019-12-20 DIAGNOSIS — N1339 Other hydronephrosis: Secondary | ICD-10-CM | POA: Diagnosis present

## 2019-12-20 DIAGNOSIS — R262 Difficulty in walking, not elsewhere classified: Secondary | ICD-10-CM | POA: Diagnosis not present

## 2019-12-20 DIAGNOSIS — Z20822 Contact with and (suspected) exposure to covid-19: Secondary | ICD-10-CM | POA: Diagnosis present

## 2019-12-20 DIAGNOSIS — Z8546 Personal history of malignant neoplasm of prostate: Secondary | ICD-10-CM

## 2019-12-20 DIAGNOSIS — Z7189 Other specified counseling: Secondary | ICD-10-CM | POA: Diagnosis not present

## 2019-12-20 DIAGNOSIS — Z79899 Other long term (current) drug therapy: Secondary | ICD-10-CM

## 2019-12-20 DIAGNOSIS — M79606 Pain in leg, unspecified: Secondary | ICD-10-CM

## 2019-12-20 DIAGNOSIS — R627 Adult failure to thrive: Secondary | ICD-10-CM | POA: Diagnosis present

## 2019-12-20 DIAGNOSIS — L8990 Pressure ulcer of unspecified site, unspecified stage: Secondary | ICD-10-CM | POA: Diagnosis not present

## 2019-12-20 DIAGNOSIS — E870 Hyperosmolality and hypernatremia: Secondary | ICD-10-CM | POA: Diagnosis present

## 2019-12-20 DIAGNOSIS — Z9181 History of falling: Secondary | ICD-10-CM | POA: Diagnosis not present

## 2019-12-20 DIAGNOSIS — Z923 Personal history of irradiation: Secondary | ICD-10-CM

## 2019-12-20 DIAGNOSIS — Z880 Allergy status to penicillin: Secondary | ICD-10-CM

## 2019-12-20 DIAGNOSIS — C7951 Secondary malignant neoplasm of bone: Secondary | ICD-10-CM | POA: Diagnosis present

## 2019-12-20 DIAGNOSIS — I951 Orthostatic hypotension: Secondary | ICD-10-CM

## 2019-12-20 DIAGNOSIS — N1831 Chronic kidney disease, stage 3a: Secondary | ICD-10-CM | POA: Diagnosis present

## 2019-12-20 DIAGNOSIS — E441 Mild protein-calorie malnutrition: Secondary | ICD-10-CM | POA: Diagnosis not present

## 2019-12-20 DIAGNOSIS — I129 Hypertensive chronic kidney disease with stage 1 through stage 4 chronic kidney disease, or unspecified chronic kidney disease: Secondary | ICD-10-CM | POA: Diagnosis present

## 2019-12-20 DIAGNOSIS — N1330 Unspecified hydronephrosis: Secondary | ICD-10-CM | POA: Diagnosis not present

## 2019-12-20 DIAGNOSIS — C61 Malignant neoplasm of prostate: Secondary | ICD-10-CM | POA: Diagnosis not present

## 2019-12-20 DIAGNOSIS — Z79890 Hormone replacement therapy (postmenopausal): Secondary | ICD-10-CM

## 2019-12-20 DIAGNOSIS — E860 Dehydration: Secondary | ICD-10-CM | POA: Diagnosis present

## 2019-12-20 DIAGNOSIS — R531 Weakness: Secondary | ICD-10-CM | POA: Diagnosis not present

## 2019-12-20 DIAGNOSIS — Z881 Allergy status to other antibiotic agents status: Secondary | ICD-10-CM

## 2019-12-20 DIAGNOSIS — D631 Anemia in chronic kidney disease: Secondary | ICD-10-CM | POA: Diagnosis present

## 2019-12-20 DIAGNOSIS — R296 Repeated falls: Secondary | ICD-10-CM | POA: Diagnosis not present

## 2019-12-20 DIAGNOSIS — Z515 Encounter for palliative care: Secondary | ICD-10-CM

## 2019-12-20 DIAGNOSIS — R5381 Other malaise: Secondary | ICD-10-CM | POA: Diagnosis not present

## 2019-12-20 DIAGNOSIS — Z9079 Acquired absence of other genital organ(s): Secondary | ICD-10-CM

## 2019-12-20 DIAGNOSIS — Z681 Body mass index (BMI) 19 or less, adult: Secondary | ICD-10-CM | POA: Diagnosis not present

## 2019-12-20 DIAGNOSIS — Z23 Encounter for immunization: Secondary | ICD-10-CM | POA: Diagnosis not present

## 2019-12-20 DIAGNOSIS — Z9049 Acquired absence of other specified parts of digestive tract: Secondary | ICD-10-CM

## 2019-12-20 DIAGNOSIS — Z91041 Radiographic dye allergy status: Secondary | ICD-10-CM

## 2019-12-20 DIAGNOSIS — Z66 Do not resuscitate: Secondary | ICD-10-CM | POA: Diagnosis not present

## 2019-12-20 DIAGNOSIS — R64 Cachexia: Secondary | ICD-10-CM | POA: Diagnosis present

## 2019-12-20 DIAGNOSIS — Z8249 Family history of ischemic heart disease and other diseases of the circulatory system: Secondary | ICD-10-CM

## 2019-12-20 DIAGNOSIS — R42 Dizziness and giddiness: Secondary | ICD-10-CM | POA: Diagnosis not present

## 2019-12-20 DIAGNOSIS — R7989 Other specified abnormal findings of blood chemistry: Secondary | ICD-10-CM

## 2019-12-20 DIAGNOSIS — K219 Gastro-esophageal reflux disease without esophagitis: Secondary | ICD-10-CM | POA: Diagnosis not present

## 2019-12-20 DIAGNOSIS — Z886 Allergy status to analgesic agent status: Secondary | ICD-10-CM

## 2019-12-20 DIAGNOSIS — J986 Disorders of diaphragm: Secondary | ICD-10-CM | POA: Diagnosis not present

## 2019-12-20 LAB — COMPREHENSIVE METABOLIC PANEL
ALT: 16 U/L (ref 0–44)
AST: 30 U/L (ref 15–41)
Albumin: 2.7 g/dL — ABNORMAL LOW (ref 3.5–5.0)
Alkaline Phosphatase: 231 U/L — ABNORMAL HIGH (ref 38–126)
Anion gap: 12 (ref 5–15)
BUN: 40 mg/dL — ABNORMAL HIGH (ref 8–23)
CO2: 24 mmol/L (ref 22–32)
Calcium: 8.9 mg/dL (ref 8.9–10.3)
Chloride: 110 mmol/L (ref 98–111)
Creatinine, Ser: 2.13 mg/dL — ABNORMAL HIGH (ref 0.61–1.24)
GFR calc Af Amer: 31 mL/min — ABNORMAL LOW (ref >60)
GFR calc non Af Amer: 27 mL/min — ABNORMAL LOW (ref >60)
Glucose, Bld: 113 mg/dL — ABNORMAL HIGH (ref 70–99)
Potassium: 4.8 mmol/L (ref 3.5–5.1)
Sodium: 146 mmol/L — ABNORMAL HIGH (ref 135–145)
Total Bilirubin: 0.5 mg/dL (ref 0.3–1.2)
Total Protein: 7 g/dL (ref 6.5–8.1)

## 2019-12-20 LAB — URINALYSIS, ROUTINE W REFLEX MICROSCOPIC
Bilirubin Urine: NEGATIVE
Glucose, UA: NEGATIVE mg/dL
Hgb urine dipstick: NEGATIVE
Ketones, ur: NEGATIVE mg/dL
Leukocytes,Ua: NEGATIVE
Nitrite: NEGATIVE
Protein, ur: NEGATIVE mg/dL
Specific Gravity, Urine: 1.004 — ABNORMAL LOW (ref 1.005–1.030)
pH: 7 (ref 5.0–8.0)

## 2019-12-20 LAB — SARS CORONAVIRUS 2 BY RT PCR (HOSPITAL ORDER, PERFORMED IN ~~LOC~~ HOSPITAL LAB): SARS Coronavirus 2: NEGATIVE

## 2019-12-20 LAB — CBC WITH DIFFERENTIAL/PLATELET
Abs Immature Granulocytes: 0.04 10*3/uL (ref 0.00–0.07)
Basophils Absolute: 0 10*3/uL (ref 0.0–0.1)
Basophils Relative: 0 %
Eosinophils Absolute: 0 10*3/uL (ref 0.0–0.5)
Eosinophils Relative: 0 %
HCT: 35.2 % — ABNORMAL LOW (ref 39.0–52.0)
Hemoglobin: 10.8 g/dL — ABNORMAL LOW (ref 13.0–17.0)
Immature Granulocytes: 1 %
Lymphocytes Relative: 8 %
Lymphs Abs: 0.7 10*3/uL (ref 0.7–4.0)
MCH: 30.1 pg (ref 26.0–34.0)
MCHC: 30.7 g/dL (ref 30.0–36.0)
MCV: 98.1 fL (ref 80.0–100.0)
Monocytes Absolute: 0.6 10*3/uL (ref 0.1–1.0)
Monocytes Relative: 7 %
Neutro Abs: 7.3 10*3/uL (ref 1.7–7.7)
Neutrophils Relative %: 84 %
Platelets: 504 10*3/uL — ABNORMAL HIGH (ref 150–400)
RBC: 3.59 MIL/uL — ABNORMAL LOW (ref 4.22–5.81)
RDW: 15.4 % (ref 11.5–15.5)
WBC: 8.7 10*3/uL (ref 4.0–10.5)
nRBC: 0 % (ref 0.0–0.2)

## 2019-12-20 LAB — MAGNESIUM: Magnesium: 2.4 mg/dL (ref 1.7–2.4)

## 2019-12-20 LAB — BRAIN NATRIURETIC PEPTIDE: B Natriuretic Peptide: 188.4 pg/mL — ABNORMAL HIGH (ref 0.0–100.0)

## 2019-12-20 MED ORDER — AMLODIPINE BESYLATE 5 MG PO TABS
2.5000 mg | ORAL_TABLET | Freq: Every day | ORAL | Status: DC
  Administered 2019-12-21 – 2019-12-24 (×4): 2.5 mg via ORAL
  Filled 2019-12-20 (×5): qty 1

## 2019-12-20 MED ORDER — LACTATED RINGERS IV SOLN: INTRAVENOUS | Status: DC

## 2019-12-20 MED ORDER — LOSARTAN POTASSIUM 25 MG PO TABS
25.0000 mg | ORAL_TABLET | Freq: Every day | ORAL | Status: DC
  Administered 2019-12-21: 25 mg via ORAL
  Filled 2019-12-20: qty 1

## 2019-12-20 MED ORDER — ENOXAPARIN SODIUM 30 MG/0.3ML ~~LOC~~ SOLN
30.0000 mg | SUBCUTANEOUS | Status: DC
  Administered 2019-12-21 – 2019-12-25 (×5): 30 mg via SUBCUTANEOUS
  Filled 2019-12-20 (×5): qty 0.3

## 2019-12-20 MED ORDER — INFLUENZA VAC A&B SA ADJ QUAD 0.5 ML IM PRSY
0.5000 mL | PREFILLED_SYRINGE | INTRAMUSCULAR | Status: AC
  Administered 2019-12-23: 0.5 mL via INTRAMUSCULAR
  Filled 2019-12-20 (×2): qty 0.5

## 2019-12-20 MED ORDER — ACETAMINOPHEN 325 MG PO TABS
650.0000 mg | ORAL_TABLET | Freq: Four times a day (QID) | ORAL | Status: DC | PRN
  Administered 2019-12-22: 650 mg via ORAL
  Filled 2019-12-20: qty 2

## 2019-12-20 MED ORDER — THIAMINE HCL 100 MG PO TABS
100.0000 mg | ORAL_TABLET | Freq: Every day | ORAL | Status: DC
  Administered 2019-12-21 – 2019-12-24 (×4): 100 mg via ORAL
  Filled 2019-12-20 (×5): qty 1

## 2019-12-20 MED ORDER — SODIUM CHLORIDE 0.9 % IV BOLUS
1000.0000 mL | Freq: Once | INTRAVENOUS | Status: AC
  Administered 2019-12-20: 1000 mL via INTRAVENOUS

## 2019-12-20 MED ORDER — ACETAMINOPHEN 650 MG RE SUPP: 650.0000 mg | Freq: Four times a day (QID) | RECTAL | Status: DC | PRN

## 2019-12-20 MED ORDER — LOPERAMIDE HCL 2 MG PO CAPS: 2.0000 mg | ORAL_CAPSULE | Freq: Three times a day (TID) | ORAL | Status: DC | PRN

## 2019-12-20 MED ORDER — POLYVINYL ALCOHOL 1.4 % OP SOLN: 1.0000 [drp] | Freq: Every day | OPHTHALMIC | Status: DC | PRN

## 2019-12-20 MED ORDER — ADULT MULTIVITAMIN W/MINERALS CH
1.0000 | ORAL_TABLET | Freq: Every day | ORAL | Status: DC
  Administered 2019-12-21 – 2019-12-24 (×3): 1 via ORAL
  Filled 2019-12-20 (×5): qty 1

## 2019-12-20 MED ORDER — MIRTAZAPINE 15 MG PO TABS
15.0000 mg | ORAL_TABLET | Freq: Every day | ORAL | Status: DC
  Administered 2019-12-20 – 2019-12-24 (×5): 15 mg via ORAL
  Filled 2019-12-20 (×5): qty 1

## 2019-12-20 MED ORDER — HYDROXYZINE HCL 25 MG PO TABS
25.0000 mg | ORAL_TABLET | Freq: Every evening | ORAL | Status: DC | PRN
  Administered 2019-12-22 (×2): 25 mg via ORAL
  Filled 2019-12-20 (×2): qty 1

## 2019-12-20 MED ORDER — FOLIC ACID 1 MG PO TABS
1.0000 mg | ORAL_TABLET | Freq: Every day | ORAL | Status: DC
  Administered 2019-12-21 – 2019-12-23 (×3): 1 mg via ORAL
  Filled 2019-12-20 (×5): qty 1

## 2019-12-20 MED ORDER — POLYETHYLENE GLYCOL 3350 17 G PO PACK: 17.0000 g | PACK | Freq: Every day | ORAL | Status: DC | PRN

## 2019-12-20 MED ORDER — ONDANSETRON HCL 4 MG/2ML IJ SOLN
4.0000 mg | Freq: Four times a day (QID) | INTRAMUSCULAR | Status: DC | PRN
  Administered 2019-12-23 – 2019-12-25 (×3): 4 mg via INTRAVENOUS
  Filled 2019-12-20 (×3): qty 2

## 2019-12-20 MED ORDER — POTASSIUM CHLORIDE CRYS ER 10 MEQ PO TBCR
10.0000 meq | EXTENDED_RELEASE_TABLET | Freq: Every day | ORAL | Status: DC
  Administered 2019-12-21 – 2019-12-24 (×4): 10 meq via ORAL
  Filled 2019-12-20 (×7): qty 1

## 2019-12-20 MED ORDER — ONDANSETRON HCL 4 MG PO TABS: 4.0000 mg | ORAL_TABLET | Freq: Four times a day (QID) | ORAL | Status: DC | PRN

## 2019-12-20 NOTE — Evaluation (Signed)
Physical Therapy Evaluation Patient Details Name: Jorge [name] MRN: [id number] DOB: [birth year] Today's Date: 12/20/2019   History of Present Illness  86 yo M with H/O metasttic prostate cancer, HTN, GERD, recent weight loss, large HH with poor intake, recent multiple falls.IN [facility]  7/21 post fall. Patient called EMs due to feeling of being pushed and risk for fall.  Clinical Impression  The patient did mobilize with 1 assisting to sitting on bed edge. Patient noted to intermittently lean posterior. Does not report spinning, " Like I am being pulled". Patient stood with mod assistance and bilateral UE support. Unable to take a step due to trunk posterior lean and patient reporting that he feels like he will fall. Unable to safely ambulate.   Patient lives aline and as noted has had decreased ability to care for self and decreased intake. Patient may benefit from [facility] and transition to ALF. Patient is very pleasant and willing to mobilize as able.  Pt admitted with above diagnosis.  Pt currently with functional limitations due to the deficits listed below (see PT Problem List). Pt will benefit from skilled PT to increase their independence and safety with mobility to allow discharge to the venue listed below.         Follow Up Recommendations SNF;Supervision/Assistance - 24 hour    Equipment Recommendations  None recommended by PT    Recommendations for Other Services       Precautions / Restrictions Precautions Precautions: Fall      Mobility  Bed Mobility Overal bed mobility: Needs Assistance Bed Mobility: Rolling;Sidelying to Sit;Sit to Supine Rolling: Min assist Sidelying to sit: Min assist   Sit to supine: Mod assist   General bed mobility comments: use of rails to roll , Assistanc efor trunk to sitting, assistance with legs to return to supine.  Transfers Overall transfer level: Needs assistance Equipment used: 1 person hand held assist Transfers: Sit to/from Stand Sit  to Stand: Mod assist         General transfer comment: Held onto footstool rail and 1 HHA., noted trunk leaning posteriorly  Ambulation/Gait             General Gait Details: NT, needs 2 to attempt  Stairs            Wheelchair Mobility    Modified Rankin (Stroke Patients Only)       Balance Overall balance assessment: History of Falls;Needs assistance Sitting-balance support: Bilateral upper extremity supported;Feet supported Sitting balance-Leahy Scale: Poor Sitting balance - Comments: intermittent leans to each side and posteriorly   Standing balance support: Bilateral upper extremity supported;During functional activity Standing balance-Leahy Scale: Poor Standing balance comment: again leans posteriorly                             Pertinent Vitals/Pain Pain Assessment: Faces Faces Pain Scale: Hurts a little bit Pain Location: left hip Pain Descriptors / Indicators: Discomfort Pain Intervention(s): Monitored during session    Home Living Family/patient expects to be discharged to:: Private residence Living Arrangements: Alone Available Help at Discharge: Family;Friend(s) Type of Home: House Home Access: Stairs to enter Entrance Stairs-Rails: None;Can reach both Entrance Stairs-Number of Steps: 6 at back, 3 at front Home Layout: One level Home Equipment: Walker - 2 wheels;Cane - single point;Shower seat;Grab bars - tub/shower Additional Comments: patient has family/friends that check on him.    Prior Function Level of Independence: Independent with assistive device(s)  Comments: lives alone with decline to care for self.     Hand Dominance   Dominant Hand: Right    Extremity/Trunk Assessment   Upper Extremity Assessment Upper Extremity Assessment: Generalized weakness    Lower Extremity Assessment Lower Extremity Assessment: Generalized weakness    Cervical / Trunk Assessment Cervical / Trunk Assessment: Kyphotic   Communication   Communication: HOH  Cognition Arousal/Alertness: Awake/alert Behavior During Therapy: WFL for tasks assessed/performed Overall Cognitive Status: Within Functional Limits for tasks assessed                                        General Comments      Exercises     Assessment/Plan    PT Assessment Patient needs continued PT services  PT Problem List Decreased strength;Decreased knowledge of use of DME;Decreased activity tolerance;Decreased balance;Decreased mobility       PT Treatment Interventions DME instruction;Gait training;Functional mobility training;Therapeutic activities;Therapeutic exercise;Patient/family education    PT Goals (Current goals can be found in the Care Plan section)  Acute Rehab PT Goals Patient Stated Goal: I am going to need some help. PT Goal Formulation: With patient Time For Goal Achievement: 01/03/20    Frequency Min 2X/week   Barriers to discharge Decreased caregiver support;Inaccessible home environment      Co-evaluation               AM-PAC PT "6 Clicks" Mobility  Outcome Measure Help needed turning from your back to your side while in a flat bed without using bedrails?: A Lot Help needed moving from lying on your back to sitting on the side of a flat bed without using bedrails?: A Lot Help needed moving to and from a bed to a chair (including a wheelchair)?: A Lot Help needed standing up from a chair using your arms (e.g., wheelchair or bedside chair)?: A Lot Help needed to walk in hospital room?: Total Help needed climbing 3-5 steps with a railing? : Total 6 Click Score: 10    End of Session Equipment Utilized During Treatment: Gait belt Activity Tolerance: Patient limited by fatigue Patient left: in bed;with call bell/phone within reach Nurse Communication: Mobility status PT Visit Diagnosis: Difficulty in walking, not elsewhere classified (R26.2);Unsteadiness on feet (R26.81);History of  falling (Z91.81)    Time: 1630-1700 PT Time Calculation (min) (ACUTE ONLY): 30 min   Charges:   PT Evaluation $PT Eval Low Complexity: 1 Low PT Treatments $Therapeutic Activity: 8-22 mins        Tresa Endo PT Acute Rehabilitation Services Pager [phone] Office [phone]   Claretha Cooper 12/20/2019, 5:16 PM

## 2019-12-20 NOTE — ED Provider Notes (Signed)
  Physical Exam  BP (!) 159/88   Pulse 79   Temp 98 F (36.7 C) (Oral)   Resp (!) 21   Ht 6\' 1"  (1.854 m)   Wt 69.4 kg   SpO2 100%   BMI 20.19 kg/m   Physical Exam  ED Course/Procedures     Procedures  MDM  Care assumed at 3 PM.  Patient is here with weakness.  Patient has an AKI.  Initially plan to get MRI but patient was unable to tolerate MRI.  CT head was ordered.  Patient was unable to ambulate.  Signout pending admission for AKI and weakness  4 pm I talked to Dr. Bonner Puna from hospitalist.  He recommend IV fluids and case management and PT consult.  6:52 PM Physical therapy recommends SNF placement.  CT head showed no bleeding.  Since patient still unable to walk, will admit for AKI and weakness. Dr. Kennon Rounds to admit      Jorge Freeze, MD 12/20/19 [id number]

## 2019-12-20 NOTE — ED Triage Notes (Signed)
Patient arrived by EMS from home. Patient c/o weakness x 6 months, weakness has worsened over the past couple of days.  Patient lives alone per EMS. Patient unable to get out of bed and ambulate with walker at home.   Patient is incontinent of bowel and bladder.

## 2019-12-20 NOTE — ED Provider Notes (Signed)
[facility] DEPT Provider Note   CSN: [id number] Arrival date & time: 12/20/19  1139     History No chief complaint on file.   Jorge [name] is a 86 y.o. male.  86 yo M with a chief complaint of inability to walk.  Patient tried to get up today and he felt like he was being pushed to the left.  He does not feel dizzy but did feel like the world was moving to the left when he tried to stand.  Denies issue with quick head or eye movement.  Denies headache or neck pain denies cough or congestion.  Denies fevers.  Unfortunately the patient has been unable to tolerate solid foods for some time.  Has been having a diet consisting mostly of boost.  He was able to have a couple bites of chicken last night.  Denies any worsening nausea or vomiting from baseline.  Denies diarrhea.  Denies urinary symptoms.  The history is provided by the patient.  Illness Severity:  Moderate Onset quality:  Gradual Duration:  1 day Timing:  Constant Progression:  Worsening Chronicity:  New Associated symptoms: no abdominal pain, no chest pain, no congestion, no diarrhea, no fever, no headaches, no myalgias, no rash, no shortness of breath and no vomiting        Past Medical History:  Diagnosis Date  . Anemia   . Anxiety   . ED (erectile dysfunction)   . First degree heart block   . GERD (gastroesophageal reflux disease)    01-30-2019   per pt no issue since surgery 06/ 2019  . Hernia, hiatal GI-- dr p. hung   hx s/p [facility] repair 03/ 2013,  recurrent w/ gastric outlet obstruction 06/ 2019 s/p  open takedown/ gastrostomy tube;  last EGD 06-15-219 epic ,  large HH  . History of colonic polyps   . History of gastric ulcer 2008   w/ erosive esophagitis  . History of syncope    01/ 2011   per ED visit in epic ,  vasovagal epidose, negative work-up  . Hx of radiation therapy    prostate cancer ,  completed 03/ 2011  . Hydronephrosis of left kidney   . Hydronephrosis, left     due to malignant lymphadenopathy-- treated with ureteral stent  . Hypertension   . Insomnia   . Metastatic castration-resistant adenocarcinoma of prostate [facility]) urologist-- dr dahlstedt/  oncologist--- dr Benay Spice   incidental finding pt had suprapubic prostatectoy for BPH on  09-18-2006 found to have Gleason 7 prostate cancer;   completed radiation therapy 03/ 2011 for rising PSA;  recurrent w/ mets 06/ 2018  . Schatzki's ring   . Stricture of esophagus GI--- dr p. hung   (01-30-2019  per pt no issues since surgery 06/ 2019)   tortuous esophagus (noted in epic since 2008)  s/p  repair large [facility] 03/ 2013  recurrent w/ obstruction s/p takedown 06/ 2019  . Urinary incontinence   . Vitamin D deficiency   . Wears dentures    fuller upper and partial lower    Patient Active Problem List   Diagnosis Date Noted  . FTT (failure to thrive) in adult 11/11/2019  . Right leg weakness 10/28/2019  . AKI (acute kidney injury) ([place]) 10/28/2019  . Fall at home, initial encounter 10/28/2019  . Weakness 10/03/2019  . Lesion of skin of nose 04/23/2019  . Hematuria, gross 04/09/2018  . Hypokalemia 04/09/2018  . Malnutrition ([place]) 04/09/2018  .  Constipation 12/31/2017  . S/P prostatectomy 12/30/2017  . Prostate cancer metastatic to bone ([place]) 12/30/2017  . Hydronephrosis of left kidney 12/30/2017  . Small bowel obstruction ([place]) 09/16/2017  . Osteoarthritis 09/12/2017  . Hot flash due to medication 04/05/2017  . Squamous cell skin cancer 11/15/2016  . Elbow pain 10/21/2015  . 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 Insomnia 08/18/2014  . Sebaceous cyst 08/18/2014  . Cerumen impaction 07/23/2013  . Eczema 08/23/2012  . Pain in joint, shoulder region 08/23/2012  . Low back pain 05/21/2012  . Actinic keratoses 10/26/2011  . Contact dermatitis 09/22/2011  . Large hiatal hernia 07/13/2011  . Nonspecific (abnormal) findings on radiological and other examination of  gastrointestinal tract 06/05/2011  . LUQ abdominal pain 04/22/2011  . Chest pain, atypical 04/22/2011  . Irreducible hiatal hernia 04/22/2011  . Neoplasm of uncertain behavior of skin 11/10/2010  . HIP PAIN 04/28/2010  . Diarrhea 10/07/2008  . ERECTILE DYSFUNCTION 08/22/2008  . Weight loss 08/22/2008  . PERSONAL HX COLON CANCER 11/06/2007  . Vitamin D deficiency 04/19/2007  . Anxiety state 04/19/2007  . HTN (hypertension) 04/19/2007  . GERD 04/19/2007  . PEPTIC ULCER DISEASE 04/19/2007  . CERVICAL STRAIN 04/19/2007  . COLONIC POLYPS, HX OF 04/19/2007    Past Surgical History:  Procedure Laterality Date  . APPENDECTOMY  age 70  . CATARACT EXTRACTION W/ INTRAOCULAR LENS  IMPLANT, BILATERAL Bilateral yrs ago  . CYST REMOVAL NECK N/A 10/16/2014   Procedure: EXCISION CYST POSTERIOR NECK;  Surgeon: Autumn Messing III, MD;  Location: [facility];  Service: General;  Laterality: N/A;  posterior  . CYSTOSCOPY W/ RETROGRADES Left 01/04/2018   Procedure: CYSTOSCOPY LEFT STENT PLACEMENT;  Surgeon: Franchot Gallo, MD;  Location: [facility];  Service: Urology;  Laterality: Left;  . CYSTOSCOPY W/ URETERAL STENT PLACEMENT Left 01/31/2019   Procedure: CYSTOSCOPY WITH STENT REPLACEMENT;  Surgeon: Franchot Gallo, MD;  Location: [facility];  Service: Urology;  Laterality: Left;  . ESOPHAGOGASTRODUODENOSCOPY  06/05/2011   Procedure: ESOPHAGOGASTRODUODENOSCOPY (EGD);  Surgeon: Gatha Mayer, MD;  Location[facility];  Service: Endoscopy;  Laterality: N/A;  . ESOPHAGOGASTRODUODENOSCOPY N/A 09/16/2017   Procedure: ESOPHAGOGASTRODUODENOSCOPY (EGD);  Surgeon: Carol Ada, MD;  Location: [facility];  Service: Endoscopy;  Laterality: N/A;  . ESOPHAGOGASTRODUODENOSCOPY N/A 09/30/2019   Procedure: UPPER ENDOSCOPY;  Surgeon: Johnathan Hausen, MD;  Location: [facility];  Service: General;  Laterality: N/A;  . GASTROSTOMY N/A 09/20/2017   Procedure: INSERTION OF GASTROSTOMY TUBE;   Surgeon: Stark Klein, MD;  Location: [facility];  Service: General;  Laterality: N/A;  . HIATAL HERNIA REPAIR  06/10/2011   Procedure: LAPAROSCOPIC REPAIR OF HIATAL HERNIA;  Surgeon: Pedro Earls, MD;  Location: [facility];  Service: General;  Laterality: N/A;  Laparoscopic repair of paraesophageal hernia.  . SUPRAPUBIC PROSTATECTOMY  09-18-2006   @[facility]        Family History  Problem Relation Age of Onset  . Pancreatic cancer Father   . Hypertension Father   . Cancer Father   . Hypertension Other     Social History   Tobacco Use  . Smoking status: Never Smoker  . Smokeless tobacco: Never Used  Vaping Use  . Vaping Use: Never used  Substance Use Topics  . Alcohol use: Not Currently  . Drug use: No    Home Medications Prior to Admission medications   Medication Sig Start Date End Date Taking? Authorizing Provider  acetaminophen (TYLENOL) 500 MG tablet Take  1,000 mg by mouth every 6 (six) hours as needed for mild pain or moderate pain.    [provider]  amLODipine (NORVASC) 2.5 MG tablet Take 1 tablet (2.5 mg total) by mouth daily. 11/11/19   [name], [name], MD  famotidine (PEPCID) 40 MG tablet Take 1 tablet (40 mg total) by mouth daily. 10/03/19   [name], [name], MD  hydrOXYzine (ATARAX/VISTARIL) 25 MG tablet Take 1-2 tablets (25-50 mg total) by mouth at bedtime as needed (insomnia). 11/17/19   [name], [name], MD  Leuprolide Acetate, 3 Month, (LUPRON DEPOT-PED, 74-MONTH, IM) Inject into the muscle every 3 (three) months.    [provider]  loperamide (IMODIUM A-D) 2 MG tablet Take 1-2 tablets (2-4 mg total) by mouth 3 (three) times daily as needed for diarrhea or loose stools. 10/21/19   [name], [name], MD  losartan (COZAAR) 25 MG tablet Take 1 tablet (25 mg total) by mouth daily. 11/11/19   [name], [name], MD  mirtazapine (REMERON) 15 MG tablet Take 1 tablet (15 mg total) by mouth daily. 11/25/19   [name], [name], MD  polyethylene glycol  powder (GLYCOLAX/MIRALAX) powder Take 17 g by mouth 2 (two) times daily as needed for mild constipation or moderate constipation. 01/10/18   [name], [name], MD  polyvinyl alcohol (LIQUIFILM TEARS) 1.4 % ophthalmic solution Place 1 drop into both eyes daily as needed for dry eyes.     [provider]  potassium chloride (KLOR-CON) 10 MEQ tablet Take 1 tablet (10 mEq total) by mouth daily. 11/15/19   [name], [name], MD    Allergies    Aspirin, Azithromycin, Cefuroxime axetil, Iodinated diagnostic agents, Iodine, Other, Naproxen, and Penicillins  Review of Systems   Review of Systems  Constitutional: Negative for chills and fever.  HENT: Negative for congestion and facial swelling.   Eyes: Negative for discharge and visual disturbance.  Respiratory: Negative for shortness of breath.   Cardiovascular: Negative for chest pain and palpitations.  Gastrointestinal: Negative for abdominal pain, diarrhea and vomiting.  Musculoskeletal: Negative for arthralgias and myalgias.  Skin: Negative for color change and rash.  Neurological: Positive for dizziness and weakness. Negative for tremors, syncope and headaches.  Psychiatric/Behavioral: Negative for confusion and dysphoric mood.    Physical Exam Updated Vital Signs BP (!) 138/91   Pulse 91   Temp 98 F (36.7 C) (Oral)   Resp 17   Ht 6\' 1"  (1.854 m)   Wt 69.4 kg   SpO2 99%   BMI 20.19 kg/m   Physical Exam Vitals and nursing note reviewed.  Constitutional:      Appearance: He is well-developed.  HENT:     Head: Normocephalic and atraumatic.  Eyes:     Pupils: Pupils are equal, round, and reactive to light.  Neck:     Vascular: No JVD.  Cardiovascular:     Rate and Rhythm: Normal rate and regular rhythm.     Heart sounds: No murmur heard.  No friction rub. No gallop.   Pulmonary:     Effort: No respiratory distress.     Breath sounds: No wheezing.  Abdominal:     General: There is no distension.      Tenderness: There is no guarding or rebound.  Musculoskeletal:        General: Normal range of motion.     Cervical back: Normal range of motion and neck supple.  Skin:    Coloration: Skin is not pale.  Findings: No rash.  Neurological:     Mental Status: He is alert and oriented to person, place, and time.     GCS: GCS eye subscore is 4. GCS verbal subscore is 5. GCS motor subscore is 6.     Cranial Nerves: Cranial nerves are intact.     Sensory: Sensation is intact.     Motor: Motor function is intact.     Coordination: Coordination is intact.     Comments: Patient was unable to even sit up on his own assistance.  Psychiatric:        Behavior: Behavior normal.     ED Results / Procedures / Treatments   Labs (all labs ordered are listed, but only abnormal results are displayed) Labs Reviewed  CBC WITH DIFFERENTIAL/PLATELET - Abnormal; Notable for the following components:      Result Value   RBC 3.59 (*)    Hemoglobin 10.8 (*)    HCT 35.2 (*)    Platelets 504 (*)    All other components within normal limits  COMPREHENSIVE METABOLIC PANEL - Abnormal; Notable for the following components:   Sodium 146 (*)    Glucose, Bld 113 (*)    BUN 40 (*)    Creatinine, Ser 2.13 (*)    Albumin 2.7 (*)    Alkaline Phosphatase 231 (*)    GFR calc non Af Amer 27 (*)    GFR calc Af Amer 31 (*)    All other components within normal limits  SARS CORONAVIRUS 2 BY RT PCR (HOSPITAL ORDER, [facility])  MAGNESIUM  URINALYSIS, ROUTINE W REFLEX MICROSCOPIC  BRAIN NATRIURETIC PEPTIDE    EKG None  Radiology No results found.  Procedures Procedures (including critical care time)  Medications Ordered in ED Medications  sodium chloride 0.9 % bolus 1,000 mL (has no administration in time range)  sodium chloride 0.9 % bolus 1,000 mL (1,000 mLs Intravenous New Bag/Given 12/20/19 1254)    ED Course  I have reviewed the triage vital signs and the nursing  notes.  Pertinent labs & imaging results that were available during my care of the patient were reviewed by me and considered in my medical decision making (see chart for details).    MDM Rules/Calculators/A&P                          86 yo M with a chief complaint of an inability to walk.  This has been a progressively worsening problem for him.  He has had progressive weakness since he is unable to eat and drink for some weeks now.  Was seen at the end of July for similar complaint.  He does feel a bit unsteady and feels like he is being pushed to the left.  Feeling better on exam.  Went to MRI but unable to lie flat for exam.  Patient states has been a problem for this been going on for quite some time.  Asked him how he was able to perform the MRI couple months ago and he was not sure.  States he is not able to lay back flat and would prefer not to have the MRI performed.  He would like to try and walk and see how he does.  I was able to stand the patient but he felt a bit unsteady he felt a feeling 1 where the other he would fall down. Was able to stand on his own. His renal  function is worse than his baseline. Likely due to decreased oral intake. That could be the cause of him doing lightheaded when he stands up. Will discuss with the hospitalist for possible admission.  Since the patient was unable to lay back for MRI will obtain a chest x-ray and a BNP that clinically he appears dry. We will covid test.  The patients results and plan were reviewed and discussed.   Any x-rays performed were independently reviewed by myself.   Differential diagnosis were considered with the presenting HPI.  Medications  sodium chloride 0.9 % bolus 1,000 mL (has no administration in time range)  sodium chloride 0.9 % bolus 1,000 mL (1,000 mLs Intravenous New Bag/Given 12/20/19 1254)    Vitals:   12/20/19 1205 12/20/19 1219 12/20/19 1230 12/20/19 1300  BP: 139/84  (!) 124/93 (!) 138/91  Pulse: 100  95  91  Resp: 19  (!) 23 17  Temp: 98 F (36.7 C)     TempSrc: Oral     SpO2: 100%  98% 99%  Weight:  69.4 kg    Height:  6\' 1"  (1.854 m)      Final diagnoses:  AKI (acute kidney injury) ([place])  Orthostasis  FTT (failure to thrive) in adult       Final Clinical Impression(s) / ED Diagnoses Final diagnoses:  AKI (acute kidney injury) ([place])  Orthostasis  FTT (failure to thrive) in adult    Rx / DC Orders ED Discharge Orders    None       Deno Etienne, DO 12/20/19 1517

## 2019-12-20 NOTE — Progress Notes (Signed)
Failed attempt at MRI. Patient unable to tolerate the procedure when supine (patient MUST be supine), and states " I cant breathe."

## 2019-12-20 NOTE — ED Notes (Signed)
RN said PT was going to ambulate pt.

## 2019-12-20 NOTE — ED Notes (Signed)
This nurse has attempted report times three to 4E Rm 1417

## 2019-12-20 NOTE — Progress Notes (Signed)
TOC CM spoke to pt at bedside. He is requesting SNF for rehab. Will start auth and fax referral to SNF. [place], [facility] ED TOC CM [phone]

## 2019-12-20 NOTE — H&P (Signed)
History and Physical    JAHN FRANCHINI MWN:[id number] DOB: [birth year] DOA: 12/20/2019  PCP: Cassandria Anger, MD  Patient coming from: Home  I have personally briefly reviewed patient's old medical records in [place]  Chief Complaint: Inability to walk  HPI: Jorge [name] is a 86 y.o. male with medical history significant of metastatic prostate cancer, GERD/hiatal hernia and failure to thrive with 53 pound weight loss over the past year due to inability to eat related to his hiatal hernia.  Was recently admitted in July of this year after a fall at home with a similar presentation that included acute kidney injury.  He was sent home with home health and PT.  He had mostly finished this and had a nurse coming out to the home.  He does have a lot of difficulty sleeping and eating and reports he has not been able to keep solid food down for the past 6 weeks.  He has also lost 2 teeth in the past week.  He reports drinking 1-3 boosts per day. When he got up this morning he tried to walk down the hall and was running into the walls.  Eventually he called EMS for this reason. ED Course: In the ED he was noted to have acute kidney injury, he had a PT eval that has recommended SNF placement due to inability to safely maintain his balance and ambulate.  He had a negative CT of his head.  Review of Systems: As per HPI otherwise 10 point review of systems negative.   Past Medical History:  Diagnosis Date  . Anemia   . Anxiety   . ED (erectile dysfunction)   . First degree heart block   . GERD (gastroesophageal reflux disease)    01-30-2019   per pt no issue since surgery 06/ 2019  . Hernia, hiatal GI-- dr p. hung   hx s/p [facility] repair 03/ 2013,  recurrent w/ gastric outlet obstruction 06/ 2019 s/p  open takedown/ gastrostomy tube;  last EGD 06-15-219 epic ,  large HH  . History of colonic polyps   . History of gastric ulcer 2008   w/ erosive esophagitis  . History of syncope    01/  2011   per ED visit in epic ,  vasovagal epidose, negative work-up  . Hx of radiation therapy    prostate cancer ,  completed 03/ 2011  . Hydronephrosis of left kidney   . Hydronephrosis, left    due to malignant lymphadenopathy-- treated with ureteral stent  . Hypertension   . Insomnia   . Metastatic castration-resistant adenocarcinoma of prostate [facility]) urologist-- dr dahlstedt/  oncologist--- dr Benay Spice   incidental finding pt had suprapubic prostatectoy for BPH on  09-18-2006 found to have Gleason 7 prostate cancer;   completed radiation therapy 03/ 2011 for rising PSA;  recurrent w/ mets 06/ 2018  . Schatzki's ring   . Stricture of esophagus GI--- dr p. hung   (01-30-2019  per pt no issues since surgery 06/ 2019)   tortuous esophagus (noted in epic since 2008)  s/p  repair large [facility] 03/ 2013  recurrent w/ obstruction s/p takedown 06/ 2019  . Urinary incontinence   . Vitamin D deficiency   . Wears dentures    fuller upper and partial lower    Past Surgical History:  Procedure Laterality Date  . APPENDECTOMY  age 24  . CATARACT EXTRACTION W/ INTRAOCULAR LENS  IMPLANT, BILATERAL Bilateral yrs ago  . CYST  REMOVAL NECK N/A 10/16/2014   Procedure: EXCISION CYST POSTERIOR NECK;  Surgeon: Autumn Messing III, MD;  Location: [facility];  Service: General;  Laterality: N/A;  posterior  . CYSTOSCOPY W/ RETROGRADES Left 01/04/2018   Procedure: CYSTOSCOPY LEFT STENT PLACEMENT;  Surgeon: Franchot Gallo, MD;  Location: [facility];  Service: Urology;  Laterality: Left;  . CYSTOSCOPY W/ URETERAL STENT PLACEMENT Left 01/31/2019   Procedure: CYSTOSCOPY WITH STENT REPLACEMENT;  Surgeon: Franchot Gallo, MD;  Location: [facility];  Service: Urology;  Laterality: Left;  . ESOPHAGOGASTRODUODENOSCOPY  06/05/2011   Procedure: ESOPHAGOGASTRODUODENOSCOPY (EGD);  Surgeon: Gatha Mayer, MD;  Location: [facility] END[facility]COPY;  Service: Endoscopy;  Laterality: N/A;  . ESOPHAGOGASTRODUODENOSCOPY  N/A 09/16/2017   Procedure: ESOPHAGOGASTRODUODENOSCOPY (EGD);  Surgeon: Carol Ada, MD;  Location: [facility];  Service: Endoscopy;  Laterality: N/A;  . ESOPHAGOGASTRODUODENOSCOPY N/A 09/30/2019   Procedure: UPPER ENDOSCOPY;  Surgeon: Johnathan Hausen, MD;  Location: [facility];  Service: General;  Laterality: N/A;  . GASTROSTOMY N/A 09/20/2017   Procedure: INSERTION OF GASTROSTOMY TUBE;  Surgeon: Stark Klein, MD;  Location: [facility];  Service: General;  Laterality: N/A;  . HIATAL HERNIA REPAIR  06/10/2011   Procedure: LAPAROSCOPIC REPAIR OF HIATAL HERNIA;  Surgeon: Pedro Earls, MD;  Location: [facility];  Service: General;  Laterality: N/A;  Laparoscopic repair of paraesophageal hernia.  . SUPRAPUBIC PROSTATECTOMY  09-18-2006   @[facility]      reports that he has never smoked. He has never used smokeless tobacco. He reports previous alcohol use. He reports that he does not use drugs. He is retired, he lives alone, has not been driving since not feel safe.  His family includes a sister-in-law Debbie whose husband is recently had surgery and she is taking care of him as well as a friend named Zigmund Daniel is having some difficulty breathing.  His 2 brothers have died in the last 23 months.  He is widowed and his wife died in 07/22/2000 from breast cancer.  He did remarry from 2011-2015, but is divorced.  Allergies  Allergen Reactions  . Aspirin Other (See Comments)    Ulcerated stomach  . Azithromycin Shortness Of Breath  . Cefuroxime Axetil Shortness Of Breath  . Iodinated Diagnostic Agents Anaphylaxis  . Iodine Other (See Comments)    Per pt tremors and dyspnea  . Other Other (See Comments)    Twist in esophagus so patient can not take large pills/capsules  . Naproxen Other (See Comments)     upset stomach - like with other NSAIDs  . Penicillins Rash    Can take the shot, but not the pill 18 months ago     Family History  Problem Relation Age of Onset  . Pancreatic cancer Father   . Hypertension  Father   . Cancer Father   . Hypertension Other     Prior to Admission medications   Medication Sig Start Date End Date Taking? Authorizing Provider  acetaminophen (TYLENOL) 500 MG tablet Take 1,000 mg by mouth every 6 (six) hours as needed for mild pain or moderate pain.    [provider]  amLODipine (NORVASC) 2.5 MG tablet Take 1 tablet (2.5 mg total) by mouth daily. 11/11/19   [name], [name], MD  famotidine (PEPCID) 40 MG tablet Take 1 tablet (40 mg total) by mouth daily. 10/03/19   [name], [name], MD  hydrOXYzine (ATARAX/VISTARIL) 25 MG tablet Take 1-2 tablets (25-50 mg total) by mouth at bedtime as needed (insomnia). 11/17/19  [name], [name], MD  Leuprolide Acetate, 3 Month, (LUPRON DEPOT-PED, 24-MONTH, IM) Inject into the muscle every 3 (three) months.    [provider]  loperamide (IMODIUM A-D) 2 MG tablet Take 1-2 tablets (2-4 mg total) by mouth 3 (three) times daily as needed for diarrhea or loose stools. 10/21/19   [name], [name], MD  losartan (COZAAR) 25 MG tablet Take 1 tablet (25 mg total) by mouth daily. 11/11/19   [name], [name], MD  mirtazapine (REMERON) 15 MG tablet Take 1 tablet (15 mg total) by mouth daily. 11/25/19   [name], [name], MD  polyethylene glycol powder (GLYCOLAX/MIRALAX) powder Take 17 g by mouth 2 (two) times daily as needed for mild constipation or moderate constipation. 01/10/18   [name], [name], MD  polyvinyl alcohol (LIQUIFILM TEARS) 1.4 % ophthalmic solution Place 1 drop into both eyes daily as needed for dry eyes.     [provider]  potassium chloride (KLOR-CON) 10 MEQ tablet Take 1 tablet (10 mEq total) by mouth daily. 11/15/19   [name], [name], MD    Physical Exam: Vitals:   12/20/19 1800 12/20/19 1900 12/20/19 2100 12/20/19 2204  BP: (!) 159/88 (!) 145/85 (!) 146/84 (!) 147/88  Pulse: 79 88 85 82  Resp: (!) 21 14 (!) 21 16  Temp:   98.3 F (36.8 C) 97.8 F (36.6 C)  TempSrc:    Oral Oral  SpO2: 100% 100% 97% 98%  Weight:      Height:        Constitutional: NAD, calm, comfortable, frail Eyes: PERRL, lids and conjunctivae normal ENMT: Mucous membranes are dry. Posterior pharynx clear of any exudate or lesions.Normal dentition.  Neck: normal, supple, no masses, no thyromegaly Respiratory: clear to auscultation bilaterally, no wheezing, no crackles. Normal respiratory effort. No accessory muscle use.  Cardiovascular: Regular rate and rhythm, no murmurs / rubs / gallops. No extremity edema. 2+ pedal pulses. No carotid bruits.  Abdomen: no tenderness, no masses palpated. No hepatosplenomegaly. Bowel sounds positive.  Musculoskeletal: no clubbing / cyanosis. No joint deformity upper and lower extremities.  There is muscle wasting, loss of subcu fat. Skin: Pressure ulcer on sacrum Neurologic: CN 2-12 grossly intact. Sensation intact, DTR normal. Strength 5/5 in all 4.  Psychiatric: Normal judgment and insight. Alert and oriented x 3. Normal mood.   Labs on Admission: I have personally reviewed following labs and imaging studies  CBC: Recent Labs  Lab 12/20/19 1241  WBC 8.7  NEUTROABS 7.3  HGB 10.8*  HCT 35.2*  MCV 98.1  PLT 130*   Basic Metabolic Panel: Recent Labs  Lab 12/20/19 1241  NA 146*  K 4.8  CL 110  CO2 24  GLUCOSE 113*  BUN 40*  CREATININE 2.13*  CALCIUM 8.9  MG 2.4   GFR: Estimated Creatinine Clearance: 23.5 mL/min (A) (by C-G formula based on SCr of 2.13 mg/dL (H)). Liver Function Tests: Recent Labs  Lab 12/20/19 1241  AST 30  ALT 16  ALKPHOS 231*  BILITOT 0.5  PROT 7.0  ALBUMIN 2.7*   Urine analysis:    Component Value Date/Time   COLORURINE STRAW (A) 12/20/2019 1600   APPEARANCEUR CLEAR 12/20/2019 1600   LABSPEC 1.004 (L) 12/20/2019 1600   PHURINE 7.0 12/20/2019 1600   GLUCOSEU NEGATIVE 12/20/2019 1600   GLUCOSEU NEGATIVE 11/19/2009 0802   HGBUR NEGATIVE 12/20/2019 1600   BILIRUBINUR NEGATIVE 12/20/2019 1600    KETONESUR NEGATIVE 12/20/2019 1600   PROTEINUR NEGATIVE 12/20/2019 1600  UROBILINOGEN 0.2 11/19/2009 0802   NITRITE NEGATIVE 12/20/2019 1600   LEUKOCYTESUR NEGATIVE 12/20/2019 1600    Radiological Exams on Admission: CT Head Wo Contrast  Result Date: 12/20/2019 CLINICAL DATA:  Dizziness EXAM: CT HEAD WITHOUT CONTRAST TECHNIQUE: Contiguous axial images were obtained from the base of the skull through the vertex without intravenous contrast. COMPARISON:  None. FINDINGS: Brain: No evidence of acute territorial infarction, hemorrhage, hydrocephalus,extra-axial collection or mass lesion/mass effect. Normal gray-white differentiation. Ventricles are normal in size and contour. Vascular: No hyperdense vessel or unexpected calcification. Skull: The skull is intact. No fracture or focal lesion identified. Sinuses/Orbits: The visualized paranasal sinuses and mastoid air cells are clear. The orbits and globes intact. Other: None IMPRESSION: No acute intracranial abnormality. Findings consistent with age related atrophy and chronic small vessel ischemia Electronically Signed   By: Prudencio Pair M.D.   On: 12/20/2019 17:12   DG Chest Port 1 View  Result Date: 12/20/2019 CLINICAL DATA:  Orthopnea EXAM: PORTABLE CHEST 1 VIEW COMPARISON:  July 29, 2019 FINDINGS: The heart size and mediastinal contours are mildly. Again noted is elevation of the left hemidiaphragm with a air-filled probable dilated. The right lung is clear. No large airspace consolidation or pleural effusion. No acute osseous abnormality. IMPRESSION: No active disease. Stable elevation of the left hemidiaphragm with a mildly dilated air-filled stomach. Electronically Signed   By: Prudencio Pair M.D.   On: 12/20/2019 15:54    Assessment/Plan Principal Problem:   Acute kidney injury ([place]) Active Problems:   HTN (hypertension)   GERD   Weight loss   Irreducible hiatal hernia   Prostate cancer metastatic to bone (HCC)   Malnutrition (HCC)    Weakness   FTT (failure to thrive) in adult   Pressure ulcer  Acute kidney injury IV hydration Avoid nephrotoxic agents Trend  Failure to thrive/Malnutrition Multifactorial, mostly due to his hiatal hernia and inability to keep solid food down.  He reports losing 45 pounds per week for the last year.  He is open to feeding tube if that will help him. Check B12, vitamin D level Multivitamin Thiamine Folic acid Nutrition consult  GERD/hiatal hernia There are no more surgical options for him. Protonix IV He does not really have true nausea, Zofran has been ordered  Metastatic prostate cancer Missed his last point with Dr. Benay Spice.  His cancer has progressed despite every 3 month Depo-Lupron Palliative care has been out to see him.  Please see their note dated 12/13/2019 and MOST form  Weakness Likely from combination of metastatic cancer and poor sleep and poor nutrition He is amenable to going to a SNF to increase his function  Hypertension Continue losartan  Pressure ulcer Wound and ostomy nurse consultation  DVT prophylaxis: SCD/Compression stockings Code Status: DNR confirmed with patient Family Communication: Patient at bedside Disposition Plan: SNF Consults called: Transitions of care Admission status: Inpatient Inpatient status due to acute kidney injury, IV fluid hydration, continued work-up  Donnamae Jude MD [facility]  If 7PM-7AM, please contact night-coverage 12/20/2019, 11:57 PM

## 2019-12-21 ENCOUNTER — Inpatient Hospital Stay (HOSPITAL_COMMUNITY): Payer: Medicare Other

## 2019-12-21 DIAGNOSIS — N1330 Unspecified hydronephrosis: Secondary | ICD-10-CM

## 2019-12-21 DIAGNOSIS — C7951 Secondary malignant neoplasm of bone: Secondary | ICD-10-CM

## 2019-12-21 DIAGNOSIS — C61 Malignant neoplasm of prostate: Secondary | ICD-10-CM

## 2019-12-21 DIAGNOSIS — R627 Adult failure to thrive: Secondary | ICD-10-CM

## 2019-12-21 DIAGNOSIS — K440 Diaphragmatic hernia with obstruction, without gangrene: Secondary | ICD-10-CM

## 2019-12-21 DIAGNOSIS — E870 Hyperosmolality and hypernatremia: Secondary | ICD-10-CM

## 2019-12-21 LAB — BASIC METABOLIC PANEL
Anion gap: 9 (ref 5–15)
BUN: 35 mg/dL — ABNORMAL HIGH (ref 8–23)
CO2: 24 mmol/L (ref 22–32)
Calcium: 8.7 mg/dL — ABNORMAL LOW (ref 8.9–10.3)
Chloride: 113 mmol/L — ABNORMAL HIGH (ref 98–111)
Creatinine, Ser: 2.01 mg/dL — ABNORMAL HIGH (ref 0.61–1.24)
GFR calc Af Amer: 33 mL/min — ABNORMAL LOW (ref >60)
GFR calc non Af Amer: 29 mL/min — ABNORMAL LOW (ref >60)
Glucose, Bld: 95 mg/dL (ref 70–99)
Potassium: 3.9 mmol/L (ref 3.5–5.1)
Sodium: 146 mmol/L — ABNORMAL HIGH (ref 135–145)

## 2019-12-21 LAB — CBC
HCT: 34.3 % — ABNORMAL LOW (ref 39.0–52.0)
Hemoglobin: 10.2 g/dL — ABNORMAL LOW (ref 13.0–17.0)
MCH: 30.3 pg (ref 26.0–34.0)
MCHC: 29.7 g/dL — ABNORMAL LOW (ref 30.0–36.0)
MCV: 101.8 fL — ABNORMAL HIGH (ref 80.0–100.0)
Platelets: 432 10*3/uL — ABNORMAL HIGH (ref 150–400)
RBC: 3.37 MIL/uL — ABNORMAL LOW (ref 4.22–5.81)
RDW: 15.4 % (ref 11.5–15.5)
WBC: 8.5 10*3/uL (ref 4.0–10.5)
nRBC: 0 % (ref 0.0–0.2)

## 2019-12-21 LAB — VITAMIN D 25 HYDROXY (VIT D DEFICIENCY, FRACTURES): Vit D, 25-Hydroxy: 29.34 ng/mL — ABNORMAL LOW (ref 30–100)

## 2019-12-21 LAB — VITAMIN B12: Vitamin B-12: 235 pg/mL (ref 180–914)

## 2019-12-21 LAB — PSA: Prostatic Specific Antigen: 239.94 ng/mL — ABNORMAL HIGH (ref 0.00–4.00)

## 2019-12-21 MED ORDER — KATE FARMS STANDARD 1.4 PO LIQD
325.0000 mL | Freq: Three times a day (TID) | ORAL | Status: DC
  Administered 2019-12-21: 325 mL via ORAL
  Filled 2019-12-21 (×10): qty 325

## 2019-12-21 MED ORDER — PANTOPRAZOLE SODIUM 40 MG IV SOLR
40.0000 mg | Freq: Every day | INTRAVENOUS | Status: DC
  Administered 2019-12-21 – 2019-12-22 (×2): 40 mg via INTRAVENOUS
  Filled 2019-12-21 (×2): qty 40

## 2019-12-21 MED ORDER — DEXTROSE IN LACTATED RINGERS 5 % IV SOLN: INTRAVENOUS | Status: DC

## 2019-12-21 MED ORDER — PROSOURCE PLUS PO LIQD
30.0000 mL | Freq: Two times a day (BID) | ORAL | Status: DC
  Administered 2019-12-21 – 2019-12-23 (×5): 30 mL via ORAL
  Filled 2019-12-21 (×5): qty 30

## 2019-12-21 NOTE — TOC Initial Note (Addendum)
[facility] of [facility]) - Initial/Assessment Note    Patient Details  Name: Jorge [name] MRN: [id number] Date of Birth: [birth year]  [facility]) CM/SW Contact:    Erenest Rasher, RN Phone Number:  [phone] 12/21/2019, 11:47 AM  Clinical Narrative:                 TOC CM spoke to pt at bedside. Pt states he lives alone, his friend, Zigmund Daniel takes him to his appts. Pt has RW and cane at home. States his appetite is poor and feels he is dehydrated. Pt agrees to go to SNF rehab. Presented pt with list of SNF in area. Gave permission to create a FL2 and fax referral to SNF. Will fax to [facility] to start auth process. PT recommending SNF rehab. Pt states he is active with [facility]. Message sent to rep, Tommi Rumps pt plan is SNF rehab.  Expected Discharge Plan: Skilled Nursing Facility Barriers to Discharge: Continued Medical Work up   Patient Goals and CMS Choice Patient states their goals for this hospitalization and ongoing recovery are:: want to go to rehab CMS Medicare.gov Compare Post Acute Care list provided to:: Patient Choice offered to / list presented to : Patient  Expected Discharge Plan and Services Expected Discharge Plan: [place] In-house Referral: Clinical Social Work Discharge Planning Services: CM Consult Post Acute Care Choice: [place] Living arrangements for the past 2 months: [place]                                      Prior Living Arrangements/Services Living arrangements for the past 2 months: Single Family Home Lives with:: Self Patient language and need for interpreter reviewed:: Yes Do you feel safe going back to the place where you live?: No   lives alone, having falls  Need for Family Participation in Patient Care: Yes (Comment) Care giver support system in place?: Yes (comment) Current home services: Safety alert, DME (rolling walker, cane) Criminal Activity/Legal Involvement  Pertinent to Current Situation/Hospitalization: No - Comment as needed  Activities of Daily Living Home Assistive Devices/Equipment: Walker (specify type), Cane (specify quad or straight), Dentures (specify type) (full upper denture, lower partial denture) ADL Screening (condition at time of admission) Patient's cognitive ability adequate to safely complete daily activities?: Yes Is the patient deaf or have difficulty hearing?: No Does the patient have difficulty seeing, even when wearing glasses/contacts?: No Does the patient have difficulty concentrating, remembering, or making decisions?: No Patient able to express need for assistance with ADLs?: Yes Does the patient have difficulty dressing or bathing?: Yes Independently performs ADLs?: No Communication: Independent Dressing (OT): Needs assistance Is this a change from baseline?: Pre-admission baseline Grooming: Independent Feeding: Independent Bathing: Needs assistance Is this a change from baseline?: Pre-admission baseline Toileting: Needs assistance Is this a change from baseline?: Pre-admission baseline In/Out Bed: Needs assistance Is this a change from baseline?: Pre-admission baseline Walks in Home: Needs assistance Is this a change from baseline?: Pre-admission baseline Does the patient have difficulty walking or climbing stairs?: Yes Weakness of Legs: Both Weakness of Arms/Hands: Both  Permission Sought/Granted Permission sought to share information with : Case Manager, Customer service manager, PCP, Family Supports Permission granted to share information with : Yes, Verbal Permission Granted  Share Information with NAME: [name]  Permission granted to share info w AGENCY: SNF rehab  Permission  granted to share info w Relationship: friend  Permission granted to share info w Contact Information: [phone]  Emotional Assessment Appearance:: Appears stated age Attitude/Demeanor/Rapport: Gracious Affect  (typically observed): Accepting Orientation: : Oriented to Self, Oriented to Place, Oriented to  Time, Oriented to Situation   Psych Involvement: No (comment)  Admission diagnosis:  Orthostasis [I95.1] FTT (failure to thrive) in adult [R62.7] AKI (acute kidney injury) ([place]) [N17.9] Acute kidney injury ([place]) [N17.9] Patient Active Problem List   Diagnosis Date Noted  . Acute kidney injury ([place]) 12/20/2019  . Pressure ulcer 12/20/2019  . FTT (failure to thrive) in adult 11/11/2019  . Right leg weakness 10/28/2019  . Fall at home, initial encounter 10/28/2019  . Weakness 10/03/2019  . Lesion of skin of nose 04/23/2019  . Hematuria, gross 04/09/2018  . Hypokalemia 04/09/2018  . Malnutrition ([place]) 04/09/2018  . Constipation 12/31/2017  . S/P prostatectomy 12/30/2017  . Prostate cancer metastatic to bone ([place]) 12/30/2017  . Hydronephrosis of left kidney 12/30/2017  . Small bowel obstruction ([place]) 09/16/2017  . Osteoarthritis 09/12/2017  . Hot flash due to medication 04/05/2017  . Squamous cell skin cancer 11/15/2016  . Elbow pain 10/21/2015  . 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 Insomnia 08/18/2014  . Sebaceous cyst 08/18/2014  . Cerumen impaction 07/23/2013  . Eczema 08/23/2012  . Pain in joint, shoulder region 08/23/2012  . Low back pain 05/21/2012  . Actinic keratoses 10/26/2011  . Contact dermatitis 09/22/2011  . Large hiatal hernia 07/13/2011  . Nonspecific (abnormal) findings on radiological and other examination of gastrointestinal tract 06/05/2011  . LUQ abdominal pain 04/22/2011  . Chest pain, atypical 04/22/2011  . Irreducible hiatal hernia 04/22/2011  . Neoplasm of uncertain behavior of skin 11/10/2010  . HIP PAIN 04/28/2010  . Diarrhea 10/07/2008  . ERECTILE DYSFUNCTION 08/22/2008  . Weight loss 08/22/2008  . PERSONAL HX COLON CANCER 11/06/2007  . Vitamin D deficiency 04/19/2007  . Anxiety state 04/19/2007  . HTN (hypertension)  04/19/2007  . GERD 04/19/2007  . Peptic ulcer 04/19/2007  . CERVICAL STRAIN 04/19/2007  . COLONIC POLYPS, HX OF 04/19/2007   PCP:  Cassandria Anger, MD Pharmacy:   [facility] [address] [place] [address] Phone: [phone] Fax: [phone]  [place], [address] [place] [address] Phone: [phone] Fax: [place], [address] [place] [address] Phone: [phone] Fax: [phone]     Social Determinants of Health (SDOH) Interventions    Readmission Risk Interventions Readmission Risk Prevention Plan 10/29/2019  Transportation Screening Complete  PCP or Specialist Appt within 5-7 Days Complete  Home Care Screening Complete  Medication Review (RN CM) Complete  Some recent data might be hidden

## 2019-12-21 NOTE — Plan of Care (Signed)

## 2019-12-21 NOTE — NC FL2 (Addendum)
[place] LEVEL OF CARE SCREENING TOOL     IDENTIFICATION  Patient Name: Jorge [name] Birthdate: 1930-03-10 Sex: male Admission Date (Current Location): 12/20/2019  [facility] and [place] Number:  Herbalist and Address:  [facility],  [place] [address], [place]      Provider Number: [id number]  Attending Physician Name and Address:  Florencia Reasons, MD  Relative Name and Phone Number:       Current Level of Care: Hospital Recommended Level of Care: [place] Prior Approval Number:    Date Approved/Denied:   PASRR Number:  [id number]   Discharge Plan: SNF    Current Diagnoses: Patient Active Problem List   Diagnosis Date Noted  . Acute kidney injury ([place]) 12/20/2019  . Pressure ulcer 12/20/2019  . FTT (failure to thrive) in adult 11/11/2019  . Right leg weakness 10/28/2019  . Fall at home, initial encounter 10/28/2019  . Weakness 10/03/2019  . Lesion of skin of nose 04/23/2019  . Hematuria, gross 04/09/2018  . Hypokalemia 04/09/2018  . Malnutrition ([place]) 04/09/2018  . Constipation 12/31/2017  . S/P prostatectomy 12/30/2017  . Prostate cancer metastatic to bone ([place]) 12/30/2017  . Hydronephrosis of left kidney 12/30/2017  . Small bowel obstruction ([place]) 09/16/2017  . Osteoarthritis 09/12/2017  . Hot flash due to medication 04/05/2017  . Squamous cell skin cancer 11/15/2016  . Elbow pain 10/21/2015  . 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 Insomnia 08/18/2014  . Sebaceous cyst 08/18/2014  . Cerumen impaction 07/23/2013  . Eczema 08/23/2012  . Pain in joint, shoulder region 08/23/2012  . Low back pain 05/21/2012  . Actinic keratoses 10/26/2011  . Contact dermatitis 09/22/2011  . Large hiatal hernia 07/13/2011  . Nonspecific (abnormal) findings on radiological and other examination of gastrointestinal tract 06/05/2011  . LUQ abdominal pain 04/22/2011  . Chest pain, atypical  04/22/2011  . Irreducible hiatal hernia 04/22/2011  . Neoplasm of uncertain behavior of skin 11/10/2010  . HIP PAIN 04/28/2010  . Diarrhea 10/07/2008  . ERECTILE DYSFUNCTION 08/22/2008  . Weight loss 08/22/2008  . PERSONAL HX COLON CANCER 11/06/2007  . Vitamin D deficiency 04/19/2007  . Anxiety state 04/19/2007  . HTN (hypertension) 04/19/2007  . GERD 04/19/2007  . Peptic ulcer 04/19/2007  . CERVICAL STRAIN 04/19/2007  . COLONIC POLYPS, HX OF 04/19/2007    Orientation RESPIRATION BLADDER Height & Weight     Self, Time, Situation, Place  Normal Continent Weight: 63.3 kg Height:  6\' 1"  (185.4 cm)  BEHAVIORAL SYMPTOMS/MOOD NEUROLOGICAL BOWEL NUTRITION STATUS      Continent Diet (Regular/Soft diet)  AMBULATORY STATUS COMMUNICATION OF NEEDS Skin   Limited Assist Verbally Normal                       Personal Care Assistance Level of Assistance  Bathing, Dressing, Feeding Bathing Assistance: Limited assistance Feeding assistance: Independent Dressing Assistance: Limited assistance     Functional Limitations Info  Sight, Speech, Hearing Sight Info: Adequate Hearing Info: Impaired Speech Info: Adequate    SPECIAL CARE FACTORS FREQUENCY  PT (By licensed PT), OT (By licensed OT)     PT Frequency: 5x per week OT Frequency: 5x per week            Contractures Contractures Info: Not present    Additional Factors Info  Code Status, Allergies Code Status Info: DNR Allergies Info: azithromycin, cefuroxime, iodinate diagnostic, aspirin, iodine, naproxen, penicillin  Current Medications (12/21/2019):  This is the current hospital active medication list Current Facility-Administered Medications  Medication Dose Route Frequency Provider Last Rate Last Admin  . (feeding supplement) PROSource Plus liquid 30 mL  30 mL Oral BID BM Florencia Reasons, MD      . acetaminophen (TYLENOL) tablet 650 mg  650 mg Oral Q6H PRN Donnamae Jude, MD       Or  . acetaminophen  (TYLENOL) suppository 650 mg  650 mg Rectal Q6H PRN Donnamae Jude, MD      . amLODipine (NORVASC) tablet 2.5 mg  2.5 mg Oral Daily Donnamae Jude, MD   2.5 mg at 12/21/19 0915  . dextrose 5 % in lactated ringers infusion   Intravenous Continuous Florencia Reasons, MD      . enoxaparin (LOVENOX) injection 30 mg  30 mg Subcutaneous Q24H Donnamae Jude, MD   30 mg at 12/21/19 0926  . feeding supplement (KATE [employer]ARMS STANDARD 1.4) liquid 325 mL  325 mL Oral TID BM Florencia Reasons, MD      . folic acid (FOLVITE) tablet 1 mg  1 mg Oral Daily Donnamae Jude, MD   1 mg at 12/21/19 5916  . hydrOXYzine (ATARAX/VISTARIL) tablet 25-50 mg  25-50 mg Oral QHS PRN Donnamae Jude, MD      . influenza vaccine adjuvanted (FLUAD) injection 0.5 mL  0.5 mL Intramuscular Tomorrow-1000 [name], MD      . loperamide (IMODIUM) capsule 2-4 mg  2-4 mg Oral TID PRN Donnamae Jude, MD      . losartan (COZAAR) tablet 25 mg  25 mg Oral Daily Donnamae Jude, MD   25 mg at 12/21/19 0915  . mirtazapine (REMERON) tablet 15 mg  15 mg Oral QHS Donnamae Jude, MD   15 mg at 12/20/19 2259  . multivitamin with minerals tablet 1 tablet  1 tablet Oral Daily Donnamae Jude, MD   1 tablet at 12/21/19 [phone]  . ondansetron (ZOFRAN) tablet 4 mg  4 mg Oral Q6H PRN Donnamae Jude, MD       Or  . ondansetron [facility]) injection 4 mg  4 mg Intravenous Q6H PRN Donnamae Jude, MD      . pantoprazole (PROTONIX) injection 40 mg  40 mg Intravenous QHS Donnamae Jude, MD      . polyethylene glycol (MIRALAX / GLYCOLAX) packet 17 g  17 g Oral Daily PRN Donnamae Jude, MD      . polyvinyl alcohol (LIQUIFILM TEARS) 1.4 % ophthalmic solution 1 drop  1 drop Both Eyes Daily PRN Donnamae Jude, MD      . potassium chloride (KLOR-CON) CR tablet 10 mEq  10 mEq Oral Daily Donnamae Jude, MD   10 mEq at 12/21/19 0914  . thiamine tablet 100 mg  100 mg Oral Daily Donnamae Jude, MD   100 mg at 12/21/19 6599     Discharge Medications: Please see discharge summary for a  list of discharge medications.  Relevant Imaging Results:  Relevant Lab Results:   Additional Information SS # [id number]  Erenest Rasher, RN

## 2019-12-21 NOTE — Progress Notes (Signed)
Initial Nutrition Assessment  RD working remotely.  DOCUMENTATION CODES:   Underweight  INTERVENTION:  - will order [employer] 1.4 po TID, each supplement provides 455 kcal and 20 grams protein. - will order 30 mL Prosource Plus BID, each supplement provides 100 kcal and 15 grams of protein. - will complete NFPE at follow-up.   *recommend evaluation for J-tube placement   NUTRITION DIAGNOSIS:   Inadequate oral intake related to acute illness, altered GI function, decreased appetite as evidenced by per patient/family report.  GOAL:   Patient will meet greater than or equal to 90% of their needs  MONITOR:   PO intake, Supplement acceptance, Labs, Weight trends  REASON FOR ASSESSMENT:   Malnutrition Screening Tool, Consult Assessment of nutrition requirement/status  ASSESSMENT:   86 y.o. male with medical history of metastatic prostate cancer, GERD/hiatal hernia, and FTT with 53 pound weight loss over the past year d/t inability to eat d/t hiatal hernia. In the ED he reported difficulty sleeping and that he lost 2 teeth in the past week. When he attempted walking on the day of presentation to the ED, he kept running into the walls.  No intakes documented since admission. Patient reports that for the past ~2 months he has had nausea and early satiety with PO intakes. In the past 4-6 weeks he has had difficulty in keeping down any solid foods and has mainly relied on drinking Boost; he consumes 2-3 bottles on average/day.   No issues with chewing or swallowing, but difficulty with keeping things down which he relays is 2/2 ongoing hiatal hernia. He has lost 2 teeth recently but does not relay that this has caused any oral pain.   He was assessed by Rison RD on 06/10/19. At that time he was experiencing very poor appetite and intakes x3 weeks and was noted to have lost 11% body weight in the previous 5 months; significant for time frame.   Weight yesterday was 139 lb and  weight on 10/21/19 was 158 lb. This indicates 19 lb weight loss (12% body weight) in the past 2 months; significant for time frame.   Suspect some degree of malnutrition, but unable to confirm d/t working remotely and inability to perform NFPE.  Per notes:  - AKI - FTT and malnutrition and open to feeding tube if it would be beneficial - hiatal hernia without a surgical option - metastatic prostate cancer   Labs reviewed; Na: 146 mmol/l, Cl: 113 mmol/l, BUN: 35 mg/dl, creatinine: 2.01 mg/dl, Ca: 8.7 mg/dl, GFR: 29 ml/min. Medications reviewed; 1 mg folvite/day, 1 tablet multivitamin with minerals/day, 40 mg IV protonix/day, 10 mEq Klor-Con/day, 100 mg thiamine/day.  IVF; D5-LR @ 75 ml/hr (306 kcal).    NUTRITION - FOCUSED PHYSICAL EXAM:  unable to complete at this time.   Diet Order:   Diet Order            DIET SOFT Room service appropriate? Yes; Fluid consistency: Thin  Diet effective now                 EDUCATION NEEDS:   No education needs have been identified at this time  Skin:     Last BM:  PTA/unknown  Height:   Ht Readings from Last 1 Encounters:  12/20/19 6\' 1"  (1.854 m)    Weight:   Wt Readings from Last 1 Encounters:  12/20/19 63.3 kg     Estimated Nutritional Needs:  Kcal:  1900-2215 kcal Protein:  95-110 grams Fluid:  >/=  2.1 L/day     Jarome Matin, MS, RD, LDN, CNSC Inpatient Clinical Dietitian RD pager # available in AMION  After hours/weekend pager # available in [facility]

## 2019-12-21 NOTE — Progress Notes (Signed)
Pt stood and urinated. Bladder scan performed after. Bladder scanner showing 54ml. Pt states he feels he empties is bladder better standing up compared to sitting.

## 2019-12-21 NOTE — Progress Notes (Addendum)
PROGRESS NOTE    Jorge [name]  NFA:[id number] DOB: December 14, 1929 DOA: 12/20/2019 PCP: Cassandria Anger, MD    No chief complaint on file.   Brief Narrative:   Chief Complaint: Inability to walk  HPI: Jorge [name] is a 86 y.o. male with medical history significant of metastatic prostate cancer, GERD/hiatal hernia and failure to thrive with 53 pound weight loss over the past year due to inability to eat related to his hiatal hernia.  Was recently admitted in July of this year after a fall at home with a similar presentation that included acute kidney injury.  He was sent home with home health and PT.  He had mostly finished this and had a nurse coming out to the home.  He does have a lot of difficulty sleeping and eating and reports he has not been able to keep solid food down for the past 6 weeks.  He has also lost 2 teeth in the past week.  He reports drinking 1-3 boosts per day. When he got up this morning he tried to walk down the hall and was running into the walls.  Eventually he called EMS for this reason. ED Course: In the ED he was noted to have acute kidney injury, he had a PT eval that has recommended SNF placement due to inability to safely maintain his balance and ambulate.  He had a negative CT of his head  Subjective:  C/o bilateral hip pain and left leg pain, reports pain at rest is 2 out 10 but gets to 7/10 when walking , he thinks the pain is from his bone mets, he denies h/o radiation treatment to bone   Assessment & Plan:   Principal Problem:   Acute kidney injury ([place]) Active Problems:   HTN (hypertension)   GERD   Weight loss   Irreducible hiatal hernia   Prostate cancer metastatic to bone (HCC)   Malnutrition (HCC)   Weakness   FTT (failure to thrive) in adult   Pressure ulcer   AKI on CKDII -BUN 40 creatinine 2.13 on presentation -ua no bacteria -bladder scan PVR is 0 -renal US showed Persistent LEFT hydronephrosis and hydroureter despite  ureteral stent which appear to be chronic,  -on hydration, hold losartan, avoid renal toxin, renal dosing meds -repeat bmp in am  Hypernatremia Sodium 146 Likely due to dehydration secondary to poor oral intake Change IV fluids from normal saline to D5 LR, encourage oral intake Repeat BMP in the morning  Anemia of chronic disease -Hemoglobin 10 at baseline  Mild thrombocytosis Likely reactive Platelet 504-432 Monitor  Hypertension Continue Norvasc, hold losartan  Metastatic prostate cancer to bone/bone pain/not able to ambulate safely/falls at home -History of suprapubic prostatectomy in 2008 -Prior CT abdomen pelvis from April 2021 showed "significant areas of osseous sclerosis identified consistent with sclerotic osseous metastases involving a few ribs, thoracolumbar vertebra, pelvis, and proximal LEFT femur" -ct head no acute findings -Per report bilateral pelvic pain and left leg pain, at rest 2 out of 10, 7 out of 10 with activity -sublingual analgesics -will discuss with rad on for palliative XRT for pain control  Addendum: Case discussed with rad Onc Dr. Dwana Curd who plan to do simulation on Monday and start palliative radiation to bone mets for pain control.   Recurrent Large hiatal hernia with paraesophageal herniation of transverse colon. H/o hiatal hernia repair in 2013, h/o gastrostomy in 2019 --Patient report recently  saw general surgery Dr. Hassell Done and he is deemed  not a surgical candidate -He has intermittent vomiting from this, report cancer bone pain is not controlled due to not able to keep down pills -He is unable to tolerate oral  cancer treatment meds due to nausea and vomiting  Vitamin D deficiency Continue vitamin D supplement  FTT is progressive weight loss, unsafe ambulation Patient and family in process of discussing goals of care, will consult palliative care in-house  Stage I pressure ulcer, present on admission Pressure offloading measures,  appreciate wound care input  DVT prophylaxis: enoxaparin (LOVENOX) injection 30 mg Start: 12/21/19 1000   Code Status: DNR Family Communication: he declined my offer to call his family Disposition:   Status is: Inpatient  Dispo: The patient is from: Home              Anticipated d/c is to: TBD              Anticipated d/c date is: TBD             Currently treating for hyponatremia, AKI, bone pain, need palliative care consult, rad onc consult  Consultants:   Palliative   Rad onc  Procedures:   none  Antimicrobials:   None     Objective: Vitals:   12/20/19 2200 12/20/19 2204 12/21/19 0210 12/21/19 0629  BP:  (!) 147/88 (!) 155/89 (!) 142/95  Pulse:  82 81 85  Resp:  16 16 16   Temp:  97.8 F (36.6 C) 98.2 F (36.8 C) 98.1 F (36.7 C)  TempSrc:  Oral    SpO2:  98% 96% 97%  Weight: 63.3 kg     Height: 6\' 1"  (1.854 m)       Intake/Output Summary (Last 24 hours) at 12/21/2019 0715 Last data filed at 12/21/2019 0629 Gross per 24 hour  Intake 523.53 ml  Output 900 ml  Net -376.47 ml   Filed Weights   12/20/19 1219 12/20/19 2200  Weight: 69.4 kg 63.3 kg    Examination:  General exam: Frail , weak ,calm, AAOx3 Respiratory system: Clear to auscultation. Respiratory effort normal. Cardiovascular system: S1 & S2 heard, RRR. No JVD, no murmur, No pedal edema. Gastrointestinal system: Abdomen is nondistended, soft and nontender. Normal bowel sounds heard.  Central nervous system: Alert and oriented. No focal neurological deficits. Extremities: Generalized weakness Skin: Stage I decubitus skin changes as shown below Psychiatry: Judgement and insight appear normal. Mood & affect appropriate.         Data Reviewed: I have personally reviewed following labs and imaging studies  CBC: Recent Labs  Lab 12/20/19 1241 12/21/19 0551  WBC 8.7 8.5  NEUTROABS 7.3  --   HGB 10.8* 10.2*  HCT 35.2* 34.3*  MCV 98.1 101.8*  PLT 504* 432*    Basic Metabolic  Panel: Recent Labs  Lab 12/20/19 1241 12/21/19 0551  NA 146* 146*  K 4.8 3.9  CL 110 113*  CO2 24 24  GLUCOSE 113* 95  BUN 40* 35*  CREATININE 2.13* 2.01*  CALCIUM 8.9 8.7*  MG 2.4  --     GFR: Estimated Creatinine Clearance: 22.7 mL/min (A) (by C-G formula based on SCr of 2.01 mg/dL (H)).  Liver Function Tests: Recent Labs  Lab 12/20/19 1241  AST 30  ALT 16  ALKPHOS 231*  BILITOT 0.5  PROT 7.0  ALBUMIN 2.7*    CBG: No results for input(s): GLUCAP in the last 168 hours.   Recent Results (from the past 240 hour(s))  SARS Coronavirus 2 by RT  PCR (hospital order, performed in [facility] hospital lab) Nasopharyngeal Nasopharyngeal Swab     Status: None   Collection Time: 12/20/19  6:06 PM   Specimen: Nasopharyngeal Swab  Result Value Ref Range Status   SARS Coronavirus 2 NEGATIVE NEGATIVE Final    Comment: (NOTE) SARS-CoV-2 target nucleic acids are NOT DETECTED.  The SARS-CoV-2 RNA is generally detectable in upper and lower respiratory specimens during the acute phase of infection. The lowest concentration of SARS-CoV-2 viral copies this assay can detect is 250 copies / mL. A negative result does not preclude SARS-CoV-2 infection and should not be used as the sole basis for treatment or other patient management decisions.  A negative result may occur with improper specimen collection / handling, submission of specimen other than nasopharyngeal swab, presence of viral mutation(s) within the areas targeted by this assay, and inadequate number of viral copies (<250 copies / mL). A negative result must be combined with clinical observations, patient history, and epidemiological information.  Fact Sheet for Patients:   StrictlyIdeas.no  Fact Sheet for Healthcare Providers: BankingDealers.co.za  This test is not yet approved or  cleared by the [place] FDA and has been authorized for detection and/or diagnosis of  SARS-CoV-2 by FDA under an Emergency Use Authorization (EUA).  This EUA will remain in effect (meaning this test can be used) for the duration of the COVID-19 declaration under Section 564(b)(1) of the Act, 21 U.S.C. section 360bbb-3(b)(1), unless the authorization is terminated or revoked sooner.  Performed at [facility], [place] [address] [place], [place] [address]          Radiology Studies: CT Head Wo Contrast  Result Date: 12/20/2019 CLINICAL DATA:  Dizziness EXAM: CT HEAD WITHOUT CONTRAST TECHNIQUE: Contiguous axial images were obtained from the base of the skull through the vertex without intravenous contrast. COMPARISON:  None. FINDINGS: Brain: No evidence of acute territorial infarction, hemorrhage, hydrocephalus,extra-axial collection or mass lesion/mass effect. Normal gray-white differentiation. Ventricles are normal in size and contour. Vascular: No hyperdense vessel or unexpected calcification. Skull: The skull is intact. No fracture or focal lesion identified. Sinuses/Orbits: The visualized paranasal sinuses and mastoid air cells are clear. The orbits and globes intact. Other: None IMPRESSION: No acute intracranial abnormality. Findings consistent with age related atrophy and chronic small vessel ischemia Electronically Signed   By: Prudencio Pair M.D.   On: 12/20/2019 17:12   DG Chest Port 1 View  Result Date: 12/20/2019 CLINICAL DATA:  Orthopnea EXAM: PORTABLE CHEST 1 VIEW COMPARISON:  July 29, 2019 FINDINGS: The heart size and mediastinal contours are mildly. Again noted is elevation of the left hemidiaphragm with a air-filled probable dilated. The right lung is clear. No large airspace consolidation or pleural effusion. No acute osseous abnormality. IMPRESSION: No active disease. Stable elevation of the left hemidiaphragm with a mildly dilated air-filled stomach. Electronically Signed   By: Prudencio Pair M.D.   On: 12/20/2019 15:54        Scheduled  Meds: . amLODipine  2.5 mg Oral Daily  . enoxaparin (LOVENOX) injection  30 mg Subcutaneous Q24H  . folic acid  1 mg Oral Daily  . influenza vaccine adjuvanted  0.5 mL Intramuscular Tomorrow-1000  . losartan  25 mg Oral Daily  . mirtazapine  15 mg Oral QHS  . multivitamin with minerals  1 tablet Oral Daily  . pantoprazole (PROTONIX) IV  40 mg Intravenous QHS  . potassium chloride  10 mEq Oral Daily  . thiamine  100  mg Oral Daily   Continuous Infusions: . lactated ringers 75 mL/hr at 12/21/19 0600     LOS: 1 day   Time spent: 68mins Greater than 50% of this time was spent in counseling, explanation of diagnosis, planning of further management, and coordination of care.  I have personally reviewed and interpreted on  12/21/2019 daily labs, tele strips, imagings as discussed above under date review session and assessment and plans.  I reviewed all nursing notes, pharmacy notes, consultant notes,  vitals, pertinent old records  I have discussed plan of care as described above with RN , patient and family on 12/21/2019  Voice Recognition /Dragon dictation system was used to create this note, attempts have been made to correct errors. Please contact the author with questions and/or clarifications.   Florencia Reasons, MD PhD FACP [facility]  Available via Epic secure chat 7am-7pm for nonurgent issues Please page for urgent issues To page the attending provider between 7A-7P or the covering provider during after hours 7P-7A, please log into the web site [email or web address] and access using universal [place] password for that web site. If you do not have the password, please call the hospital operator.    12/21/2019, 7:15 AM

## 2019-12-21 NOTE — Consult Note (Signed)
[place] Nurse Consult Note: Reason for Consult: Stage 1 pressure injury.  Skin lesion on left thoracic spine with presentation consistent with that of neoplasm.  I am not able to offer a POC for that lesion. Wound type: Pressure Pressure Injury POA: Yes/No/NA Measurement: Bedside RN to measure upon placement of silicone foam dressing Wound bed:red non-blaching erythema Drainage (amount, consistency, odor) None Periwound:Intact, dry Dressing procedure/placement/frequency: I will provide the patient with a mattress replacement today and ask nursing to cover the affected area with a silicone foam dressing, peeling back once per shift to assess skin.  Theleft thoracic area presents as a pearlescent nodule. This requires a definitive diagnosis by a provider and may or may not change the POC depending on the [place].  [place] nursing team will not follow, but will remain available to this patient, the nursing and medical teams.  Please re-consult if needed. Thanks, Maudie Flakes, MSN, RN, [place], Arther Abbott  Pager# [phone]

## 2019-12-22 DIAGNOSIS — Z515 Encounter for palliative care: Secondary | ICD-10-CM

## 2019-12-22 DIAGNOSIS — Z7189 Other specified counseling: Secondary | ICD-10-CM

## 2019-12-22 LAB — BASIC METABOLIC PANEL
Anion gap: 9 (ref 5–15)
BUN: 33 mg/dL — ABNORMAL HIGH (ref 8–23)
CO2: 24 mmol/L (ref 22–32)
Calcium: 8.7 mg/dL — ABNORMAL LOW (ref 8.9–10.3)
Chloride: 112 mmol/L — ABNORMAL HIGH (ref 98–111)
Creatinine, Ser: 1.91 mg/dL — ABNORMAL HIGH (ref 0.61–1.24)
GFR calc Af Amer: 35 mL/min — ABNORMAL LOW (ref >60)
GFR calc non Af Amer: 31 mL/min — ABNORMAL LOW (ref >60)
Glucose, Bld: 122 mg/dL — ABNORMAL HIGH (ref 70–99)
Potassium: 4 mmol/L (ref 3.5–5.1)
Sodium: 145 mmol/L (ref 135–145)

## 2019-12-22 LAB — CBC WITH DIFFERENTIAL/PLATELET
Abs Immature Granulocytes: 0.09 10*3/uL — ABNORMAL HIGH (ref 0.00–0.07)
Basophils Absolute: 0 10*3/uL (ref 0.0–0.1)
Basophils Relative: 0 %
Eosinophils Absolute: 0.1 10*3/uL (ref 0.0–0.5)
Eosinophils Relative: 1 %
HCT: 33.4 % — ABNORMAL LOW (ref 39.0–52.0)
Hemoglobin: 10.1 g/dL — ABNORMAL LOW (ref 13.0–17.0)
Immature Granulocytes: 1 %
Lymphocytes Relative: 10 %
Lymphs Abs: 1 10*3/uL (ref 0.7–4.0)
MCH: 30.4 pg (ref 26.0–34.0)
MCHC: 30.2 g/dL (ref 30.0–36.0)
MCV: 100.6 fL — ABNORMAL HIGH (ref 80.0–100.0)
Monocytes Absolute: 0.9 10*3/uL (ref 0.1–1.0)
Monocytes Relative: 9 %
Neutro Abs: 7.8 10*3/uL — ABNORMAL HIGH (ref 1.7–7.7)
Neutrophils Relative %: 79 %
Platelets: 423 10*3/uL — ABNORMAL HIGH (ref 150–400)
RBC: 3.32 MIL/uL — ABNORMAL LOW (ref 4.22–5.81)
RDW: 15.7 % — ABNORMAL HIGH (ref 11.5–15.5)
WBC: 10 10*3/uL (ref 4.0–10.5)
nRBC: 0 % (ref 0.0–0.2)

## 2019-12-22 MED ORDER — VITAMIN D 25 MCG (1000 UNIT) PO TABS
1000.0000 [IU] | ORAL_TABLET | Freq: Every day | ORAL | Status: DC
  Administered 2019-12-23: 1000 [IU] via ORAL
  Filled 2019-12-22 (×3): qty 1

## 2019-12-22 MED ORDER — VITAMIN B-12 1000 MCG PO TABS
1000.0000 ug | ORAL_TABLET | Freq: Every day | ORAL | Status: DC
  Administered 2019-12-23 – 2019-12-24 (×2): 1000 ug via ORAL
  Filled 2019-12-22 (×3): qty 1

## 2019-12-22 NOTE — Consult Note (Signed)
Consultation Note Date: 12/22/2019   Patient Name: Jorge [name]  DOB: 05/14/1929  MRN: [id number]  Age / Sex: 86 y.o., male  PCP: [name], [name], MD Referring Physician: Florencia Reasons, MD  Reason for Consultation: Establishing goals of care  HPI/Patient Profile: 86 y.o. male  with past medical history of prostate cancer with mets to bone (not currently on treatment due to n/v- stopped in May), significant hiatal hernia with significant (53 pounds) weight loss in the last year, malnutrition with teeth falling out recently, HTN admitted on 12/20/2019 with acute kidney injury presumed to be related to dehydration and failure to thrive, bone pain r/t cancer metastasis. Plan for radiation sim and treatment on Monday. Palliative medicine consulted for goals of care.   Clinical Assessment and Goals of Care:  I have reviewed medical records including EPIC notes, labs and imaging, received report from Dr. Erlinda [name], examined the patient and met at bedside with patient  to discuss diagnosis prognosis, Merna, EOL wishes, disposition and options.  I introduced Palliative Medicine as specialized medical care for people living with serious illness. It focuses on providing relief from the symptoms and stress of a serious illness.   We discussed a brief life review of the patient. He formerly worked as a Data processing manager. He found joy in playing golf. Was married- spouse is deceased.   As far as functional and nutritional status- prior to admission he was living independently at home with visits from home health RN. He denies worsening functional status- but chart review shows some increasing difficulties with ADL's. He has lost 53 pounds in the last year. He tells me he can only tolerate soft foods such as oatmeal, pudding. He enjoys tunafish salad. He is ambulatory.    We discussed his current illness and what it means  in the larger context of his on-going co-morbidities.  Natural disease trajectory and expectations at EOL were discussed. Jorge [name] shares that he is not afraid to die, however, he is not ready to die. We discussed that he has two severe advanced problems that could be life limiting- his prostate cancer, and his hiatal hernia impairing his nutrition and function.   I attempted to elicit values and goals of care important to the patient. Jorge [name] goals of care are to maintain his current level of function and quality of life for as long as possible. He would prefer to remain in his home.    The difference between aggressive medical intervention and comfort care was considered in light of the patient's goals of care. Jorge [name] wishes to continue life prolonging interventions. He states that he is very strong in his religious faith and that God will tell him when it is time for him to change his path of care.   I discussed my concern with Jorge [name] that he is nearing end of life. We discussed worries that he will continue to decline- will return home and again require hospitalization for dehydration. He shared that he felt it happened this time  because he let "things go". However, he plans on being more vigilant with his intake. He did note that he would not want to continue on a pattern of constant/frequent rehospitalizations.  Advanced directives, concepts specific to code status, artifical feeding and hydration, and rehospitalization were considered and discussed. Jorge [name] has a MOST form on file and these preferences were reviewed-  DNR Full scope treatments- treat what is treatable Antibiotics if indicated IV fluids if indicated Feeding tube long term if needed- however, if Jorge [name] were to be actively dying then he would not wish for the tube feeding to continue- his only goal for tube feeding would be to maintain his current level of function and quality of life.   Hospice and  Palliative Care services outpatient were explained and offered. Jorge [name] is currently followed by outpatient Palliative with [facility]. He is not yet ready for Hospice services.   Questions and concerns were addressed. I attempted to call patient's nephew- Jorge [name]- who patient indicated would be his surrogate decision maker and is assisting him with decisions. Jorge [name] also shared that Jorge [name] is arranging 24 hour care for him at home.    Primary Decision Maker PATIENT    SUMMARY OF RECOMMENDATIONS -DNR -GOC- treat the treatable- patient is interested in artificial nutrition as long as it is maintaining his current functional status and quality of life- would recommend GI consult as given his current anatomy I have concerns if this would be possible?  -Pain control- patient shared that his pain was controlled with current interventions- however, would recommend dexamethasone 25m po with breakfast and lunch if he again complained of uncontrolled pain,  -he may also benefit from concentrated sublingual opioid (would start with liquid morphine 557msublingual q2hr prn for pain) - again if he complains of uncontrolled pain, although he denied pain when I saw him -I left message for his nephew Jorge [name] further discussion- unclear how reliable patient is as far as relaying his functional status- definitely needs PT evaluation -Recommend ongoing f/u from outpatient Palliative- he is currently enrolled with [facility]  Code Status/Advance Care Planning:  DNR  Psycho-social/Spiritual:   Desire for further Chaplaincy support:yes  Prognosis:    Unable to determine- likely less than 6 months given his decline in nutrition and function  Discharge Planning: Home with Home Health  Primary Diagnoses: Present on Admission: . Acute kidney injury (HCSnelling. HTN (hypertension) . GERD . Weight loss . Irreducible hiatal hernia . Prostate cancer metastatic to bone (HCLeoti. Malnutrition (HCMcMinn. FTT  (failure to thrive) in adult   I have reviewed the medical record, interviewed the patient and family, and examined the patient. The following aspects are pertinent.  Past Medical History:  Diagnosis Date  . Anemia   . Anxiety   . ED (erectile dysfunction)   . First degree heart block   . GERD (gastroesophageal reflux disease)    01-30-2019   per pt no issue since surgery 06/ 2019  . Hernia, hiatal GI-- dr p. hung   [facility],  recurrent w/ gastric outlet obstruction 06/ 2019 s/p  open takedown/ gastrostomy tube;  last EGD 06-15-219 epic ,  large HH  . History of colonic polyps   . History of gastric ulcer 2008   w/ erosive esophagitis  . History of syncope    01/ 2011   per ED visit in epic ,  vasovagal epidose, negative work-up  . Hx of radiation therapy  prostate cancer ,  completed 03/ 2011  . Hydronephrosis of left kidney   . Hydronephrosis, left    due to malignant lymphadenopathy-- treated with ureteral stent  . Hypertension   . Insomnia   . Metastatic castration-resistant adenocarcinoma of prostate [facility]) urologist-- dr dahlstedt/  oncologist--- dr Benay Spice   incidental finding pt had suprapubic prostatectoy for BPH on  09-18-2006 found to have Gleason 7 prostate cancer;   completed radiation therapy 03/ 2011 for rising PSA;  recurrent w/ mets 06/ 2018  . Schatzki's ring   . Stricture of esophagus GI--- dr p. hung   (01-30-2019  per pt no issues since surgery 06/ 2019)   tortuous esophagus (noted in epic since 2008)  s/p  repair large [facility] 03/ 2013  recurrent w/ obstruction s/p takedown 06/ 2019  . Urinary incontinence   . Vitamin D deficiency   . Wears dentures    fuller upper and partial lower   Social History   Socioeconomic History  . Marital status: Widowed    Spouse name: Not on file  . Number of children: Not on file  . Years of education: Not on file  . Highest education level: Not on file  Occupational History  . Occupation: retired     Fish farm manager: RETIRED  Tobacco Use  . Smoking status: Never Smoker  . Smokeless tobacco: Never Used  Vaping Use  . Vaping Use: Never used  Substance and Sexual Activity  . Alcohol use: Not Currently  . Drug use: No  . Sexual activity: Yes  Other Topics Concern  . Not on file  Social History Narrative   Regular exercise- yes/golf   Social Determinants of Health   Financial Resource Strain:   . Difficulty of Paying Living Expenses: Not on file  Food Insecurity:   . Worried About Charity fundraiser in the Last Year: Not on file  . Ran Out of Food in the Last Year: Not on file  Transportation Needs:   . Lack of Transportation (Medical): Not on file  . Lack of Transportation (Non-Medical): Not on file  Physical Activity:   . Days of Exercise per Week: Not on file  . Minutes of Exercise per Session: Not on file  Stress:   . Feeling of Stress : Not on file  Social Connections:   . Frequency of Communication with Friends and Family: Not on file  . Frequency of Social Gatherings with Friends and Family: Not on file  . Attends Religious Services: Not on file  . Active Member of Clubs or Organizations: Not on file  . Attends Archivist Meetings: Not on file  . Marital Status: Not on file   Scheduled Meds: . (feeding supplement) PROSource Plus  30 mL Oral BID BM  . amLODipine  2.5 mg Oral Daily  . enoxaparin (LOVENOX) injection  30 mg Subcutaneous Q24H  . feeding supplement (KATE FARMS STANDARD 1.4)  325 mL Oral TID BM  . folic acid  1 mg Oral Daily  . influenza vaccine adjuvanted  0.5 mL Intramuscular Tomorrow-1000  . mirtazapine  15 mg Oral QHS  . multivitamin with minerals  1 tablet Oral Daily  . pantoprazole (PROTONIX) IV  40 mg Intravenous QHS  . potassium chloride  10 mEq Oral Daily  . thiamine  100 mg Oral Daily   Continuous Infusions: . dextrose 5% lactated ringers 75 mL/hr at 12/22/19 0102   PRN Meds:.acetaminophen **OR** acetaminophen, hydrOXYzine,  loperamide, ondansetron **OR** ondansetron (ZOFRAN) IV, polyethylene  glycol, polyvinyl alcohol Medications Prior to Admission:  Prior to Admission medications   Medication Sig Start Date End Date Taking? Authorizing Provider  acetaminophen (TYLENOL) 500 MG tablet Take 1,000 mg by mouth every 6 (six) hours as needed for mild pain or moderate pain.   Yes [provider]  amLODipine (NORVASC) 2.5 MG tablet Take 1 tablet (2.5 mg total) by mouth daily. 11/11/19  Yes [name], [name], MD  famotidine (PEPCID) 40 MG tablet Take 1 tablet (40 mg total) by mouth daily. 10/03/19  Yes [name], [name], MD  hydrOXYzine (ATARAX/VISTARIL) 25 MG tablet Take 1-2 tablets (25-50 mg total) by mouth at bedtime as needed (insomnia). 11/17/19  Yes [name], [name], MD  Leuprolide Acetate, 3 Month, (LUPRON DEPOT-PED, 45-MONTH, IM) Inject into the muscle every 3 (three) months.   Yes [provider]  loperamide (IMODIUM A-D) 2 MG tablet Take 1-2 tablets (2-4 mg total) by mouth 3 (three) times daily as needed for diarrhea or loose stools. 10/21/19  Yes [name], [name], MD  losartan (COZAAR) 25 MG tablet Take 1 tablet (25 mg total) by mouth daily. 11/11/19  Yes [name], [name], MD  mirtazapine (REMERON) 15 MG tablet Take 1 tablet (15 mg total) by mouth daily. 11/25/19  Yes [name], [name], MD  polyethylene glycol powder (GLYCOLAX/MIRALAX) powder Take 17 g by mouth 2 (two) times daily as needed for mild constipation or moderate constipation. 01/10/18  Yes [name], [name], MD  polyvinyl alcohol (LIQUIFILM TEARS) 1.4 % ophthalmic solution Place 1 drop into both eyes daily as needed for dry eyes.    Yes [provider]  potassium chloride (KLOR-CON) 10 MEQ tablet Take 1 tablet (10 mEq total) by mouth daily. 11/15/19  Yes [name], [name], MD   Allergies  Allergen Reactions  . Aspirin Other (See Comments)    Ulcerated stomach  . Azithromycin Shortness Of Breath  . Cefuroxime  Axetil Shortness Of Breath  . Iodinated Diagnostic Agents Anaphylaxis  . Iodine Other (See Comments)    Per pt tremors and dyspnea  . Other Other (See Comments)    Twist in esophagus so patient can not take large pills/capsules  . Naproxen Other (See Comments)     upset stomach - like with other NSAIDs  . Penicillins Rash    Can take the shot, but not the pill 18 months ago    Review of Systems  Constitutional: Positive for activity change, appetite change and unexpected weight change.  Respiratory: Negative for shortness of breath.     Physical Exam Vitals and nursing note reviewed.  Constitutional:      Appearance: Normal appearance.     Comments: Frail   Neurological:     Mental Status: He is oriented to person, place, and time.  Psychiatric:        Mood and Affect: Mood normal.        Behavior: Behavior normal.        Thought Content: Thought content normal.        Judgment: Judgment normal.     Vital Signs: BP 138/80 (BP Location: Left Arm)   Pulse (!) 107   Temp 98.2 F (36.8 C) (Oral)   Resp 20   Ht '6\' 1"'  (1.854 m)   Wt 63.3 kg   SpO2 95%   BMI 18.41 kg/m  Pain Scale: 0-10   Pain Score: 3    SpO2: SpO2: 95 % O2 Device:SpO2: 95 % O2 Flow Rate: .   IO:  Intake/output summary:   Intake/Output Summary (Last 24 hours) at 12/22/2019 1244 Last data filed at 12/22/2019 0455 Gross per 24 hour  Intake 2410.25 ml  Output 2350 ml  Net 60.25 ml    LBM: Last BM Date: 12/19/19 Baseline Weight: Weight: 69.4 kg Most recent weight: Weight: 63.3 kg     Palliative Assessment/Data: PPS: 20%     Thank you for this consult. Palliative medicine will continue to follow and assist as needed.    Time Total: 86 mins Greater than 50%  of this time was spent counseling and coordinating care related to the above assessment and plan.  Signed by: Mariana Kaufman, AGNP-C Palliative Medicine    Please contact Palliative Medicine Team phone at [phone] for questions and  concerns.  For individual provider: See Shea Evans

## 2019-12-22 NOTE — Progress Notes (Addendum)
PROGRESS NOTE    Jorge [name]  DZH:[id number] DOB: 08-Apr-1929 DOA: 12/20/2019 PCP: Cassandria Anger, MD    No chief complaint on file.   Brief Narrative:   Chief Complaint: Inability to walk  HPI: Jorge [name] is a 86 y.o. male with medical history significant of metastatic prostate cancer, GERD/hiatal hernia and failure to thrive with 53 pound weight loss over the past year due to inability to eat related to his hiatal hernia.  Was recently admitted in July of this year after a fall at home with a similar presentation that included acute kidney injury.  He was sent home with home health and PT.  He had mostly finished this and had a nurse coming out to the home.  He does have a lot of difficulty sleeping and eating and reports he has not been able to keep solid food down for the past 6 weeks.  He has also lost 2 teeth in the past week.  He reports drinking 1-3 boosts per day. When he got up this morning he tried to walk down the hall and was running into the walls.  Eventually he called EMS for this reason. ED Course: In the ED he was noted to have acute kidney injury, he had a PT eval that has recommended SNF placement due to inability to safely maintain his balance and ambulate.  He had a negative CT of his head  Subjective:  Still very weak but slightly better today  Assessment & Plan:   Principal Problem:   Acute kidney injury ([place]) Active Problems:   HTN (hypertension)   GERD   Weight loss   Irreducible hiatal hernia   Prostate cancer metastatic to bone (HCC)   Malnutrition (HCC)   Weakness   FTT (failure to thrive) in adult   Pressure ulcer   AKI on CKDII --ua no bacteria -bladder scan PVR is 0 -renal US showed Persistent LEFT hydronephrosis and hydroureter despite ureteral stent which appear to be chronic,  -BUN 40 creatinine 2.13 on presentation, slowly improving -continue hydration, encourage oral intake, hold losartan, avoid renal toxin, renal dosing  meds   Hypernatremia Sodium 146 Likely due to dehydration secondary to poor oral intake Change IV fluids from normal saline to D5 LR, encourage oral intake Sodium 145, continue d5/lr for another 24hrs, repeat bmp  Anemia of chronic disease -Hemoglobin 10 at baseline b12 low normal , start b12 supplement  Mild thrombocytosis Likely reactive Platelet [phone] Monitor  Hypertension Continue Norvasc, hold losartan  Metastatic prostate cancer to bone/bone pain/not able to ambulate safely/falls at home -History of suprapubic prostatectomy in 2008 -Prior CT abdomen pelvis from April 2021 showed "significant areas of osseous sclerosis identified consistent with sclerotic osseous metastases involving a few ribs, thoracolumbar vertebra, pelvis, and proximal LEFT femur" -ct head no acute findings -Per report bilateral pelvic pain and left leg pain, at rest 2 out of 10, 7 out of 10 with activity -it does not appear he is on any narcotic at home for pain control, can consider sublingual analgesics due to inconsistent oral intake from n/v -case discussed with rad Onc Dr. Dwana Curd who plan to do simulation on Monday and start palliative radiation to bone mets for pain control.  -palliative care consulted as well,   Recurrent Large hiatal hernia with paraesophageal herniation of transverse colon. -H/o hiatal hernia repair in 2013, h/o gastrostomy in 2019 --Patient report recently  saw general surgery Dr. Hassell Done and he is deemed not a surgical candidate -  He has intermittent vomiting from this, report cancer bone pain is not controlled due to not able to keep down pills -He is unable to tolerate oral  cancer treatment meds due to nausea and vomiting  Vitamin D deficiency  vitamin D supplement  FTT is progressive weight loss/underweight, unsafe ambulation Body mass index is 18.41 kg/m. Patient and family in process of discussing goals of care, will consult palliative care in-house Initial  plan for snf placement, now family and patient is thinking about going home with 24/7 care   Stage I pressure ulcer, present on admission Pressure offloading measures, appreciate wound care input  DVT prophylaxis: enoxaparin (LOVENOX) injection 30 mg Start: 12/21/19 1000   Code Status: DNR Family Communication: nephew [name]  Disposition:   Status is: Inpatient  Dispo: The patient is from: Home              Anticipated d/c is to: TBD              Anticipated d/c date is: TBD, possible in 48hrs             Currently treating for  AKI, bone pain, need palliative care consult, rad onc consult  Consultants:   Palliative   Rad onc  Wound care consult  Procedures:   none  Antimicrobials:   None     Objective: Vitals:   12/21/19 1003 12/21/19 1236 12/21/19 2114 12/22/19 0627  BP: (!) 166/81 124/75 (!) 162/88 138/80  Pulse: 87 95 89 (!) 107  Resp: 19 18 16 20   Temp: 98.2 F (36.8 C) 97.7 F (36.5 C) 99.1 F (37.3 C) 98.2 F (36.8 C)  TempSrc: Oral Oral Oral Oral  SpO2: 97% 98% 98% 95%  Weight:      Height:        Intake/Output Summary (Last 24 hours) at 12/22/2019 0940 Last data filed at 12/22/2019 0455 Gross per 24 hour  Intake 3015.54 ml  Output 2350 ml  Net 665.54 ml   Filed Weights   12/20/19 1219 12/20/19 2200  Weight: 69.4 kg 63.3 kg    Examination:  General exam: Frail , weak ,calm, AAOx3 Respiratory system: Clear to auscultation. Respiratory effort normal. Cardiovascular system: S1 & S2 heard, RRR. No JVD, no murmur, No pedal edema. Gastrointestinal system: Abdomen is nondistended, soft and nontender. Normal bowel sounds heard.  Central nervous system: Alert and oriented. No focal neurological deficits. Extremities: Generalized weakness Skin: Stage I decubitus skin changes as shown below Psychiatry: Judgement and insight appear normal. Mood & affect appropriate.         Data Reviewed: I have personally reviewed following labs and imaging  studies  CBC: Recent Labs  Lab 12/20/19 1241 12/21/19 0551 12/22/19 0613  WBC 8.7 8.5 10.0  NEUTROABS 7.3  --  7.8*  HGB 10.8* 10.2* 10.1*  HCT 35.2* 34.3* 33.4*  MCV 98.1 101.8* 100.6*  PLT 504* 432* 423*    Basic Metabolic Panel: Recent Labs  Lab 12/20/19 1241 12/21/19 0551 12/22/19 0613  NA 146* 146* 145  K 4.8 3.9 4.0  CL 110 113* 112*  CO2 24 24 24   GLUCOSE 113* 95 122*  BUN 40* 35* 33*  CREATININE 2.13* 2.01* 1.91*  CALCIUM 8.9 8.7* 8.7*  MG 2.4  --   --     GFR: Estimated Creatinine Clearance: 23.9 mL/min (A) (by C-G formula based on SCr of 1.91 mg/dL (H)).  Liver Function Tests: Recent Labs  Lab 12/20/19 1241  AST 30  ALT  16  ALKPHOS 231*  BILITOT 0.5  PROT 7.0  ALBUMIN 2.7*    CBG: No results for input(s): GLUCAP in the last 168 hours.   Recent Results (from the past 240 hour(s))  SARS Coronavirus 2 by RT PCR (hospital order, performed in [facility] hospital lab) Nasopharyngeal Nasopharyngeal Swab     Status: None   Collection Time: 12/20/19  6:06 PM   Specimen: Nasopharyngeal Swab  Result Value Ref Range Status   SARS Coronavirus 2 NEGATIVE NEGATIVE Final    Comment: (NOTE) SARS-CoV-2 target nucleic acids are NOT DETECTED.  The SARS-CoV-2 RNA is generally detectable in upper and lower respiratory specimens during the acute phase of infection. The lowest concentration of SARS-CoV-2 viral copies this assay can detect is 250 copies / mL. A negative result does not preclude SARS-CoV-2 infection and should not be used as the sole basis for treatment or other patient management decisions.  A negative result may occur with improper specimen collection / handling, submission of specimen other than nasopharyngeal swab, presence of viral mutation(s) within the areas targeted by this assay, and inadequate number of viral copies (<250 copies / mL). A negative result must be combined with clinical observations, patient history, and epidemiological  information.  Fact Sheet for Patients:   StrictlyIdeas.no  Fact Sheet for Healthcare Providers: BankingDealers.co.za  This test is not yet approved or  cleared by the [place] FDA and has been authorized for detection and/or diagnosis of SARS-CoV-2 by FDA under an Emergency Use Authorization (EUA).  This EUA will remain in effect (meaning this test can be used) for the duration of the COVID-19 declaration under Section 564(b)(1) of the Act, 21 U.S.C. section 360bbb-3(b)(1), unless the authorization is terminated or revoked sooner.  Performed at [facility], [place] [address] [place], [place] [address]          Radiology Studies: CT Head Wo Contrast  Result Date: 12/20/2019 CLINICAL DATA:  Dizziness EXAM: CT HEAD WITHOUT CONTRAST TECHNIQUE: Contiguous axial images were obtained from the base of the skull through the vertex without intravenous contrast. COMPARISON:  None. FINDINGS: Brain: No evidence of acute territorial infarction, hemorrhage, hydrocephalus,extra-axial collection or mass lesion/mass effect. Normal gray-white differentiation. Ventricles are normal in size and contour. Vascular: No hyperdense vessel or unexpected calcification. Skull: The skull is intact. No fracture or focal lesion identified. Sinuses/Orbits: The visualized paranasal sinuses and mastoid air cells are clear. The orbits and globes intact. Other: None IMPRESSION: No acute intracranial abnormality. Findings consistent with age related atrophy and chronic small vessel ischemia Electronically Signed   By: Prudencio Pair M.D.   On: 12/20/2019 17:12   US RENAL  Result Date: 12/21/2019 CLINICAL DATA:  Elevated creatinine. EXAM: RENAL / URINARY TRACT ULTRASOUND COMPLETE COMPARISON:  CT 07/29/2019 FINDINGS: Right Kidney: Renal measurements: 12.1 x 6.2 x 6.3 cm = volume: 250 mL. Increased parenchymal echogenicity. No focal mass or nephrolithiasis.  Mild hydronephrosis. Left Kidney: Renal measurements: 10.3 x 6.6 x 3.9 cm = volume: 140 mL. Mild increased cortical echogenicity. No mass or nephrolithiasis. Moderate hydronephrosis. Bladder: Appears normal for degree of bladder distention. Other: Mild prominence of the ureters left greater than right. Left-sided stent seen over the ureter and bladder although intrarenal portion not well visualized. IMPRESSION: Normal size kidneys with findings suggesting medical renal disease. Mild right-sided and moderate left-sided hydronephrosis. Left-sided ureteral stent present. Electronically Signed   By: Marin Olp M.D.   On: 12/21/2019 13:37   DG Chest [facility]  Result Date: 12/20/2019 CLINICAL DATA:  Orthopnea EXAM: PORTABLE CHEST 1 VIEW COMPARISON:  July 29, 2019 FINDINGS: The heart size and mediastinal contours are mildly. Again noted is elevation of the left hemidiaphragm with a air-filled probable dilated. The right lung is clear. No large airspace consolidation or pleural effusion. No acute osseous abnormality. IMPRESSION: No active disease. Stable elevation of the left hemidiaphragm with a mildly dilated air-filled stomach. Electronically Signed   By: Prudencio Pair M.D.   On: 12/20/2019 15:54        Scheduled Meds: . (feeding supplement) PROSource Plus  30 mL Oral BID BM  . amLODipine  2.5 mg Oral Daily  . enoxaparin (LOVENOX) injection  30 mg Subcutaneous Q24H  . feeding supplement (KATE FARMS STANDARD 1.4)  325 mL Oral TID BM  . folic acid  1 mg Oral Daily  . influenza vaccine adjuvanted  0.5 mL Intramuscular Tomorrow-1000  . mirtazapine  15 mg Oral QHS  . multivitamin with minerals  1 tablet Oral Daily  . pantoprazole (PROTONIX) IV  40 mg Intravenous QHS  . potassium chloride  10 mEq Oral Daily  . thiamine  100 mg Oral Daily   Continuous Infusions: . dextrose 5% lactated ringers 75 mL/hr at 12/22/19 0102     LOS: 2 days   Time spent: 73mins Greater than 50% of this time was  spent in counseling, explanation of diagnosis, planning of further management, and coordination of care.  I have personally reviewed and interpreted on  12/22/2019 daily labs,  imagings as discussed above under date review session and assessment and plans.  I reviewed all nursing notes, pharmacy notes, consultant notes,  vitals, pertinent old records  I have discussed plan of care as described above with RN , patient and family on 12/22/2019  Voice Recognition /Dragon dictation system was used to create this note, attempts have been made to correct errors. Please contact the author with questions and/or clarifications.   Florencia Reasons, MD PhD FACP [facility]  Available via Epic secure chat 7am-7pm for nonurgent issues Please page for urgent issues To page the attending provider between 7A-7P or the covering provider during after hours 7P-7A, please log into the web site [email or web address] and access using universal [place] password for that web site. If you do not have the password, please call the hospital operator.    12/22/2019, 9:40 AM

## 2019-12-22 NOTE — Progress Notes (Signed)
TOC CM received call from [facility] reference # [id number], [facility] are contracted. Once pt has selected a SNF, contact [employer] with location. Pt SNF [facility] has been approved. Waiting until Monday for more bed offers. [place], [place] ED TOC CM [phone]

## 2019-12-23 ENCOUNTER — Ambulatory Visit: Admit: 2019-12-23 | Payer: Medicare Other | Admitting: Radiation Oncology

## 2019-12-23 ENCOUNTER — Ambulatory Visit: Payer: Medicare Other | Admitting: Internal Medicine

## 2019-12-23 DIAGNOSIS — N179 Acute kidney failure, unspecified: Principal | ICD-10-CM

## 2019-12-23 DIAGNOSIS — E441 Mild protein-calorie malnutrition: Secondary | ICD-10-CM

## 2019-12-23 LAB — BASIC METABOLIC PANEL
Anion gap: 10 (ref 5–15)
BUN: 36 mg/dL — ABNORMAL HIGH (ref 8–23)
CO2: 25 mmol/L (ref 22–32)
Calcium: 8.5 mg/dL — ABNORMAL LOW (ref 8.9–10.3)
Chloride: 114 mmol/L — ABNORMAL HIGH (ref 98–111)
Creatinine, Ser: 1.98 mg/dL — ABNORMAL HIGH (ref 0.61–1.24)
GFR calc Af Amer: 34 mL/min — ABNORMAL LOW (ref >60)
GFR calc non Af Amer: 29 mL/min — ABNORMAL LOW (ref >60)
Glucose, Bld: 120 mg/dL — ABNORMAL HIGH (ref 70–99)
Potassium: 4 mmol/L (ref 3.5–5.1)
Sodium: 149 mmol/L — ABNORMAL HIGH (ref 135–145)

## 2019-12-23 MED ORDER — PANTOPRAZOLE SODIUM 40 MG PO TBEC
40.0000 mg | DELAYED_RELEASE_TABLET | Freq: Every day | ORAL | Status: DC
  Administered 2019-12-23 – 2019-12-24 (×2): 40 mg via ORAL
  Filled 2019-12-23 (×2): qty 1

## 2019-12-23 MED ORDER — OXYCODONE HCL 5 MG/5ML PO SOLN: 5.0000 mg | ORAL | Status: DC | PRN

## 2019-12-23 MED ORDER — DEXTROSE 5 % IV SOLN: INTRAVENOUS | Status: DC

## 2019-12-23 NOTE — Progress Notes (Signed)
PROGRESS NOTE    Jorge [name]  PFX:[id number]  DOB: [birth year]  PCP: Cassandria Anger, MD Admit date:12/20/2019 Chief compliant: Weakness, poor oral intake 86 y.o.malewith medical history significant ofmetastatic prostate cancer, GERD/hiatal hernia and failure to thrive with 53 pound weight loss over the past year due to inability to eat related to his hiatal hernia. Was recently admitted in July of this year after a fall at home with a similar presentation that included acute kidney injury. He was sent home with home health and PT. He had mostly finished this and had a nurse coming out to the home. He does have a lot of difficulty sleeping and eating and reports he has not been able to keep solid food down for the past 6 weeks. He has also lost 2 teeth in the past week. He reports drinking 1-3 boosts per day. When he got up this morning he tried to walk down the hall and was running into the walls. Eventually he called EMS for this reason. ED Course::In the ED he was noted to have acute kidney injury, he had a PT eval that has recommended SNF placement due to inability to safely maintain his balance and ambulate. He had a negative CT of his head Hospital course: Patient admitted to [facility] for further evaluation and management.  Patient's oral intake has remained poor here, mostly related to dysphagia in the setting of large hiatal hernia.  He has remained on IV fluids.  Seen by palliative care team for care goal discussions as patient not deemed to be a candidate for surgical repair of hiatal hernia by Dr. Hassell Done in the past.  Also seen by oncology, Dr. Learta Codding , who felt hip and sacral pain mostly related to decubiti then metastatic prostate cancer.  However recommended care goal discussions.  After discussions with palliative care team, patient now DNR but would like to consider feeding tube if it improves his quality of life and nutritional status.   Subjective:  Patient resting  comfortably.  Nephew apparently was at bedside earlier but left for the day this evening.  Per Education officer, museum, family leaning towards home with home hospice.  Patient feels he will eat better if at home and has access to his favorite foods.  Encouraged to have nephew bring home for tomorrow.  He remains open to option of PEG tube for nutritional support.  Objective: Vitals:   12/22/19 1248 12/22/19 2114 12/23/19 0503 12/23/19 1234  BP: (!) 142/77 (!) 152/87 (!) 142/86 133/78  Pulse: 85 99 (!) 105 (!) 102  Resp: 18 20 20 16   Temp: 98.6 F (37 C) 98.4 F (36.9 C) 99.2 F (37.3 C) 99.4 F (37.4 C)  TempSrc:  Oral Oral Oral  SpO2: 96% 96% 96% 97%  Weight:      Height:        Intake/Output Summary (Last 24 hours) at 12/23/2019 1844 Last data filed at 12/23/2019 1645 Gross per 24 hour  Intake 2027.93 ml  Output 3000 ml  Net -972.07 ml   Filed Weights   12/20/19 1219 12/20/19 2200  Weight: 69.4 kg 63.3 kg    Physical Examination:  General: Cachectic male, no acute distress noted Head ENT: Atraumatic normocephalic, PERRLA, neck supple Heart: S1-S2 heard, regular rate and rhythm, no murmurs.  No leg edema noted Lungs: Equal air entry bilaterally, no rhonchi or rales on exam, no accessory muscle use Abdomen: Bowel sounds heard, soft, nontender, nondistended. No organomegaly.  No CVA tenderness Extremities: No pedal  edema.  No cyanosis or clubbing. Neurological: Awake alert oriented x3, no focal weakness or numbness, strength and sensations to crude touch intact Skin: Sacral decubiti.         Data Reviewed: I have personally reviewed following labs and imaging studies  CBC: Recent Labs  Lab 12/20/19 1241 12/21/19 0551 12/22/19 0613  WBC 8.7 8.5 10.0  NEUTROABS 7.3  --  7.8*  HGB 10.8* 10.2* 10.1*  HCT 35.2* 34.3* 33.4*  MCV 98.1 101.8* 100.6*  PLT 504* 432* 557*   Basic Metabolic Panel: Recent Labs  Lab 12/20/19 1241 12/21/19 0551 12/22/19 0613 12/23/19 0449    NA 146* 146* 145 149*  K 4.8 3.9 4.0 4.0  CL 110 113* 112* 114*  CO2 24 24 24 25   GLUCOSE 113* 95 122* 120*  BUN 40* 35* 33* 36*  CREATININE 2.13* 2.01* 1.91* 1.98*  CALCIUM 8.9 8.7* 8.7* 8.5*  MG 2.4  --   --   --    GFR: Estimated Creatinine Clearance: 23.1 mL/min (A) (by C-G formula based on SCr of 1.98 mg/dL (H)). Liver Function Tests: Recent Labs  Lab 12/20/19 1241  AST 30  ALT 16  ALKPHOS 231*  BILITOT 0.5  PROT 7.0  ALBUMIN 2.7*   No results for input(s): LIPASE, AMYLASE in the last 168 hours. No results for input(s): AMMONIA in the last 168 hours. Coagulation Profile: No results for input(s): INR, PROTIME in the last 168 hours. Cardiac Enzymes: No results for input(s): CKTOTAL, CKMB, CKMBINDEX, TROPONINI in the last 168 hours. BNP (last 3 results) No results for input(s): PROBNP in the last 8760 hours. HbA1C: No results for input(s): HGBA1C in the last 72 hours. CBG: No results for input(s): GLUCAP in the last 168 hours. Lipid Profile: No results for input(s): CHOL, HDL, LDLCALC, TRIG, CHOLHDL, LDLDIRECT in the last 72 hours. Thyroid Function Tests: No results for input(s): TSH, T4TOTAL, FREET4, T3FREE, THYROIDAB in the last 72 hours. Anemia Panel: Recent Labs    12/21/19 0551  VITAMINB12 235   Sepsis Labs: No results for input(s): PROCALCITON, LATICACIDVEN in the last 168 hours.  Recent Results (from the past 240 hour(s))  SARS Coronavirus 2 by RT PCR (hospital order, performed in [facility] hospital lab) Nasopharyngeal Nasopharyngeal Swab     Status: None   Collection Time: 12/20/19  6:06 PM   Specimen: Nasopharyngeal Swab  Result Value Ref Range Status   SARS Coronavirus 2 NEGATIVE NEGATIVE Final    Comment: (NOTE) SARS-CoV-2 target nucleic acids are NOT DETECTED.  The SARS-CoV-2 RNA is generally detectable in upper and lower respiratory specimens during the acute phase of infection. The lowest concentration of SARS-CoV-2 viral copies this  assay can detect is 250 copies / mL. A negative result does not preclude SARS-CoV-2 infection and should not be used as the sole basis for treatment or other patient management decisions.  A negative result may occur with improper specimen collection / handling, submission of specimen other than nasopharyngeal swab, presence of viral mutation(s) within the areas targeted by this assay, and inadequate number of viral copies (<250 copies / mL). A negative result must be combined with clinical observations, patient history, and epidemiological information.  Fact Sheet for Patients:   StrictlyIdeas.no  Fact Sheet for Healthcare Providers: BankingDealers.co.za  This test is not yet approved or  cleared by the [place] FDA and has been authorized for detection and/or diagnosis of SARS-CoV-2 by FDA under an Emergency Use Authorization (EUA).  This EUA will remain  in effect (meaning this test can be used) for the duration of the COVID-19 declaration under Section 564(b)(1) of the Act, 21 U.S.C. section 360bbb-3(b)(1), unless the authorization is terminated or revoked sooner.  Performed at [facility], [place] [address] [place], [place] [address]       Radiology Studies: No results found.    Scheduled Meds: . (feeding supplement) PROSource Plus  30 mL Oral BID BM  . amLODipine  2.5 mg Oral Daily  . cholecalciferol  1,000 Units Oral Daily  . enoxaparin (LOVENOX) injection  30 mg Subcutaneous Q24H  . feeding supplement (KATE FARMS STANDARD 1.4)  325 mL Oral TID BM  . folic acid  1 mg Oral Daily  . mirtazapine  15 mg Oral QHS  . multivitamin with minerals  1 tablet Oral Daily  . pantoprazole  40 mg Oral QHS  . potassium chloride  10 mEq Oral Daily  . thiamine  100 mg Oral Daily  . vitamin B-12  1,000 mcg Oral Daily   Continuous Infusions: . dextrose 5% lactated ringers 75 mL/hr at 12/23/19 1556       Assessment/Plan:  1.  AKI on CKD stage IIIa: BUN 40 and creatinine 2.13 on presentation.  Slowly improving with hydration and creatinine around 1.9.  Patient did not have any residual on bladder scan.  Renal ultrasonogram showed persistent chronic left hydronephrosis and hydroureter with chronic stent in place.  2.  Hypernatremia, dehydration: As mentioned above, patient's oral intake had been poor.  Hypernatremia improved (146->145) after changing to hypotonic fluids (D5 LR) but up again today at 149.  Will change to D5 water.  3.  Dysphagia, poor oral intake: Probably related to large hiatal hernia then metastatic cancer per oncology.  Patient feels he is beginning to eat better.  Will request nutrition consult for calorie counts and consider GI/IR evaluation for PEG tube for additional support if needed given patient's preference.  Continue vitamin D supplements.  Patient states he does better when he  eats small portions although at times he goes 4 days without eating anything due to nausea from large hiatal hernia.  4.  Reactive thrombocytosis: Appears to be slowly improving 504->423.  Continue to monitor.  5.  Metastatic prostate cancer: History of suprapubic prostatectomy in 2008.Prior CT abdomen pelvis from April 2021 showed "significant areas of osseous sclerosis identified consistent with sclerotic osseous metastases involving a few ribs, thoracolumbar vertebra, pelvis, and proximal LEFT femur".  Given patient's complaint of low back pain/hip pain, radiation oncology consultation with Dr. Sondra Come was requested but later canceled as metastatic prostate cancer felt to be stable per Dr. Learta Codding and bone pain likely related to problem #7.  6.  Failure to thrive, generalized weakness:-ct head no acute findings.  Patient has had unsafe ambulation at home.  BMI only at 18.4.  Initial plan was SNF placement but patient now wants to go home where he feels he will eat better.  [place] Moos, arranging  for home with 24/7 supervision and possibly home hospice.  7.  Sacral/hip pain: Likely secondary to sacral decubiti.  Continue local wound care.  DVT prophylaxis: Lovenox Code Status: DNR Family / Patient Communication: Will discuss with nephew in a.m. Disposition Plan:   Status is: Inpatient  Remains inpatient appropriate because:Unsafe d/c plan and IV treatments appropriate due to intensity of illness or inability to take PO   Dispo: The patient is from: Home  Anticipated d/c is to: SNF versus home with home hospice/home health              Anticipated d/c date is: 2 days              Patient currently is not medically stable to d/c.          Time spent: 25 minutes     >50% time spent in discussions with care team and coordination of care.    Guilford Shi, MD [facility] Pager in [place]  If 7PM-7AM, please contact night-coverage [email or web address] 12/23/2019, 6:44 PM

## 2019-12-23 NOTE — Progress Notes (Signed)
Key Points: Use following P&T approved IV to PO non-antibiotic change policy.  Description contains the criteria that are approved Note: Policy Excludes:  Esophagectomy patientsPHARMACIST - PHYSICIAN COMMUNICATION DR:   [facility] CONCERNING: IV to Oral Route Change Policy  RECOMMENDATION: This patient is receiving protonix by the intravenous route.  Based on criteria approved by the Pharmacy and Therapeutics Committee, the intravenous medication(s) is/are being converted to the equivalent oral dose form(s).   DESCRIPTION: These criteria include:  The patient is eating (either orally or via tube) and/or has been taking other orally administered medications for a least 24 hours  The patient has no evidence of active gastrointestinal bleeding or impaired GI absorption (gastrectomy, short bowel, patient on TNA or NPO).  If you have questions about this conversion, please contact the Pharmacy Department  []   [phone] )  [facility] []   [phone] )  [facility]  []   [phone] )  [facility] [x]   [phone] )  [place], [place], [facility] 12/23/2019 8:20 AM

## 2019-12-23 NOTE — Progress Notes (Addendum)
210 pm Contacted [facility] and she does not have a bed today or tomorrow. She may have bed available on Wednesday. Sent choices to nephew, [name] via fax [phone] of other facilities that accepted from [email or web address]. Attending updated, possible dc tomorrow. Contacted [facility] rep, Anderson Malta with new referral for Palliative at SNF/Hospice. States they will follow pt at [facility] rehab under palliative and if he declines will change to Hospice. Jonnie Finner RN CCM, [facility] ED TOC CM [phone]    1223 pm   TOC CM received call from [facility] requesting information on SNF. Per Palliative note, pt may consider going home. Contact is nephew, [name]. Contacted [name] and states he will discuss with pt/uncle his desires. States they have [facility] they were going to meet with to do the 24 hour caregiver at home with Hospice. Nephew offered choice and requested [facility] for Home Hospice. He will give CM a call back this afternoon with final decision SNF vs Home Hospice once he discusses with pt and family. States he will check to see if pt has a long term care policy that may assist with pay cost of 24 hour caregiver. Have several agencies that are contracted with his HMO that have accepted for SNF, [facility], and [facility]. [place], [place] ED TOC CM [phone]

## 2019-12-23 NOTE — Progress Notes (Addendum)
HEMATOLOGY-ONCOLOGY PROGRESS NOTE  SUBJECTIVE: The patient was admitted secondary to inability to walk. He has had generalized weakness at home and has been losing weight secondary to his large hiatal hernia. He has not a surgical candidate. The patient is not complaining of any pain this morning. His biggest issue is difficulty swallowing secondary to his large hiatal hernia. He also reports pain to his hips likely due to decubitus.  PHYSICAL EXAMINATION:  Vitals:   12/23/19 0503 12/23/19 1234  BP: (!) 142/86 133/78  Pulse: (!) 105 (!) 102  Resp: 20 16  Temp: 99.2 F (37.3 C) 99.4 F (37.4 C)  SpO2: 96% 97%   Filed Weights   12/20/19 1219 12/20/19 2200  Weight: 69.4 kg 63.3 kg    Intake/Output from previous day: 09/19 0701 - 09/20 0700 In: 872.4 [I.V.:872.4] Out: 3600 [Urine:3600]  GENERAL:alert, no distress and comfortable Musculoskeletal: Strength symmetrical in the upper and lower extremities NEURO: alert & oriented x 3 with fluent speech, no focal motor/sensory deficits  LABORATORY DATA:  I have reviewed the data as listed CMP Latest Ref Rng & Units 12/23/2019 12/22/2019 12/21/2019  Glucose 70 - 99 mg/dL 120(H) 122(H) 95  BUN 8 - 23 mg/dL 36(H) 33(H) 35(H)  Creatinine 0.61 - 1.24 mg/dL 1.98(H) 1.91(H) 2.01(H)  Sodium 135 - 145 mmol/L 149(H) 145 146(H)  Potassium 3.5 - 5.1 mmol/L 4.0 4.0 3.9  Chloride 98 - 111 mmol/L 114(H) 112(H) 113(H)  CO2 22 - 32 mmol/L 25 24 24   Calcium 8.9 - 10.3 mg/dL 8.5(L) 8.7(L) 8.7(L)  Total Protein 6.5 - 8.1 g/dL - - -  Total Bilirubin 0.3 - 1.2 mg/dL - - -  Alkaline Phos 38 - 126 U/L - - -  AST 15 - 41 U/L - - -  ALT 0 - 44 U/L - - -    Lab Results  Component Value Date   WBC 10.0 12/22/2019   HGB 10.1 (L) 12/22/2019   HCT 33.4 (L) 12/22/2019   MCV 100.6 (H) 12/22/2019   PLT 423 (H) 12/22/2019   NEUTROABS 7.8 (H) 12/22/2019    CT Head Wo Contrast  Result Date: 12/20/2019 CLINICAL DATA:  Dizziness EXAM: CT HEAD WITHOUT CONTRAST  TECHNIQUE: Contiguous axial images were obtained from the base of the skull through the vertex without intravenous contrast. COMPARISON:  None. FINDINGS: Brain: No evidence of acute territorial infarction, hemorrhage, hydrocephalus,extra-axial collection or mass lesion/mass effect. Normal gray-white differentiation. Ventricles are normal in size and contour. Vascular: No hyperdense vessel or unexpected calcification. Skull: The skull is intact. No fracture or focal lesion identified. Sinuses/Orbits: The visualized paranasal sinuses and mastoid air cells are clear. The orbits and globes intact. Other: None IMPRESSION: No acute intracranial abnormality. Findings consistent with age related atrophy and chronic small vessel ischemia Electronically Signed   By: Prudencio Pair M.D.   On: 12/20/2019 17:12   US RENAL  Result Date: 12/21/2019 CLINICAL DATA:  Elevated creatinine. EXAM: RENAL / URINARY TRACT ULTRASOUND COMPLETE COMPARISON:  CT 07/29/2019 FINDINGS: Right Kidney: Renal measurements: 12.1 x 6.2 x 6.3 cm = volume: 250 mL. Increased parenchymal echogenicity. No focal mass or nephrolithiasis. Mild hydronephrosis. Left Kidney: Renal measurements: 10.3 x 6.6 x 3.9 cm = volume: 140 mL. Mild increased cortical echogenicity. No mass or nephrolithiasis. Moderate hydronephrosis. Bladder: Appears normal for degree of bladder distention. Other: Mild prominence of the ureters left greater than right. Left-sided stent seen over the ureter and bladder although intrarenal portion not well visualized. IMPRESSION: Normal size kidneys with  findings suggesting medical renal disease. Mild right-sided and moderate left-sided hydronephrosis. Left-sided ureteral stent present. Electronically Signed   By: Marin Olp M.D.   On: 12/21/2019 13:37   DG Chest Port 1 View  Result Date: 12/20/2019 CLINICAL DATA:  Orthopnea EXAM: PORTABLE CHEST 1 VIEW COMPARISON:  July 29, 2019 FINDINGS: The heart size and mediastinal contours are  mildly. Again noted is elevation of the left hemidiaphragm with a air-filled probable dilated. The right lung is clear. No large airspace consolidation or pleural effusion. No acute osseous abnormality. IMPRESSION: No active disease. Stable elevation of the left hemidiaphragm with a mildly dilated air-filled stomach. Electronically Signed   By: Prudencio Pair M.D.   On: 12/20/2019 15:54    ASSESSMENT AND PLAN: Prostate cancer, Gleason 7, status post a prostatectomy in 2008  Elevated PSA 2011, status post external beam radiation and androgen deprivation therapy, radiation completed March 2011, last injection July 2011  Elevated PSA October 2016, CT with periaortic and obturator lymphadenopathy  Androgen deprivation therapy resumed and continues, now on every 65-month Lupron  Bone scan 04/18/2017-new foci of metastatic disease at the right supra-acetabulum, left acetabulum, low cervical spine, and L2  CT 04/18/2017-left iliac, periaortic, and retrocrural lymphadenopathy  Bone scan 12/05/2017-progressive bone metastases; new left hydronephrosis and proximal hydroureter.  CT abdomen/pelvis 12/30/2017-moderate left hydronephrosis with moderate dilatation of the left renal pelvis and proximal left ureter without obstructing calculus. Stable large hiatal hernia. Stable sclerotic lesions in the pelvis and L3 vertebral body concerning for metastatic disease. No enlarged abdominal or pelvic lymph nodes.  Abiraterone/prednisone started 02/07/2018  PSA improved 02/26/2018, higher on 04/06/2018, improved 05/21/2018, higher 03/26/2019,higher 05/09/2019  CTs 05/14/2019-interval progression of sclerotic lesions in the bony pelvis and lumbar spine consistent with progression of bony metastatic disease. Decrease in the size of a medial right lower lobe pulmonary nodule. Decrease in size of left external iliac and retroperitoneal lymph nodes. Left internal ureteral stent with persistent mild left hydronephrosis.  Large hiatal hernia or left hemidiaphragmatic defect, stable.  Zytiga discontinued 05/14/2019  Xtandi 06/02/2019, discontinued 06/10/2019 secondary to nausea/vomiting  CT abdomen/pelvis 07/29/2019-chronic left hydronephrosis, left ureter stent, large hiatal hernia, no adenopathy sclerotic bone metastases  08/01/2019 PSA 257 (06/28/2019 PSA 72.6)  Xtandi resumed 08/13/2019  Xtandi on hold due to GI symptoms  2.Hypertension  3.Recurrent hiatal hernia-status post gastrostomy tube placement 09/20/2017  4.Left hydronephrosis status post placement of double-J stent 01/04/2018.  Jorge [name] has now been admitted secondary to weakness and inability to walk. He reports some pelvic pain which is likely due to to his decubitus. Although he does have bone mets from his prostate cancer, he has not had any recent imaging that would indicate that there is a bone lesion as the cause of his pain.   The patient has a large hiatal hernia and is unable to eat or drink. He has lost significant weight. The patient may benefit from placement of a J-tube for nutrition.  Recommendations: 1. Continue current pain medications. 2. Discussed with Dr. Sondra Come and and there is currently no role for radiation. Radiation consult has been canceled. 3. Recommend GI evaluation for possible feeding tube placement.   LOS: 3 days   Mikey Bussing, DNP, AGPCNP-BC, AOCNP 12/23/19 Jorge [name] was interviewed and examined.  He has known to me with a history of metastatic prostate cancer with known bone metastases.  He is currently maintained off up specific therapy for prostate cancer.  He was admitted with failure to thrive, malnutrition, and renal  failure.  I suspect his clinical presentation is likely related to the large hiatal hernia and inability to eat.  He has been evaluated by Dr. Hassell Done and per Jorge [name] report there is no surgical option.  It is unclear whether any of his symptoms are related to cancer.  He  denies pain other than discomfort at the sacrum, likely related to early skin breakdown.  He has an incurable malignancy, but I suspect he could have a prolonged survival if he could maintain adequate nutrition.

## 2019-12-23 NOTE — Progress Notes (Signed)
Daily Progress Note   Patient Name: Jorge [name]       Date: 12/23/2019 DOB: 1929/08/17  Age: 86 y.o. MRN#: [id number] Attending Physician: Guilford Shi, MD Primary Care Physician: Cassandria Anger, MD Admit Date: 12/20/2019  Reason for Consultation/Follow-up: Establishing goals of care  Subjective: Spoke with patient's nephew- Jorge [name] at length this morning. We discussed concern that patient will rapidly decline again after his discharge necessitating return to hospital.  [name] noted that he and other family members had been discussing Hospice last week as well as working with [facility] to arrange 24 hour care for patient at home.  There has also been referral for patient to go to SNF.  We discussed the differences in goals of care of going to SNF for rehab vs going home with hospice. I relayed my discussion with patient yesterday to [name]- that he would want a feeding tube and that it was unclear as to patient's preferences as far as going to SNF vs going home- patient did state to me he would rather be at home. I let [name] know that patient would likely be eligible for Hospice based on his failure to thrive if a feeding tube is unable to be placed- but in my conversation with patient yesterday I'm not sure if patient is ready for Hospice yet. Patient expressed desire to continue full scope care.  [name] noted that he feels that patient would prefer to be at home and he believes that Hospice would best serve him. He requests to discuss with Hospice.  [name] plans to come to hospital this afternoon and speak with Jorge [name].    Length of Stay: 3  Current Medications: Scheduled Meds:  . (feeding supplement) PROSource Plus  30 mL Oral BID BM  . amLODipine  2.5 mg Oral Daily  .  cholecalciferol  1,000 Units Oral Daily  . enoxaparin (LOVENOX) injection  30 mg Subcutaneous Q24H  . feeding supplement (KATE FARMS STANDARD 1.4)  325 mL Oral TID BM  . folic acid  1 mg Oral Daily  . mirtazapine  15 mg Oral QHS  . multivitamin with minerals  1 tablet Oral Daily  . pantoprazole  40 mg Oral QHS  . potassium chloride  10 mEq Oral Daily  . thiamine  100 mg Oral Daily  .  vitamin B-12  1,000 mcg Oral Daily    Continuous Infusions: . dextrose 5% lactated ringers 75 mL/hr at 12/23/19 0223    PRN Meds: acetaminophen **OR** acetaminophen, hydrOXYzine, loperamide, ondansetron **OR** ondansetron (ZOFRAN) IV, polyethylene glycol, polyvinyl alcohol         Vital Signs: BP 133/78 (BP Location: Left Arm)   Pulse (!) 102   Temp 99.4 F (37.4 C) (Oral)   Resp 16   Ht 6\' 1"  (1.854 m)   Wt 63.3 kg   SpO2 97%   BMI 18.41 kg/m  SpO2: SpO2: 97 % O2 Device: O2 Device: Room Air O2 Flow Rate:    Intake/output summary:   Intake/Output Summary (Last 24 hours) at 12/23/2019 1303 Last data filed at 12/23/2019 1300 Gross per 24 hour  Intake 516.22 ml  Output 2600 ml  Net -2083.78 ml   LBM: Last BM Date: 12/19/19 Baseline Weight: Weight: 69.4 kg Most recent weight: Weight: 63.3 kg       Palliative Assessment/Data: PPS: 30%       Patient Active Problem List   Diagnosis Date Noted  . Goals of care, counseling/discussion   . Palliative care by specialist   . Advanced care planning/counseling discussion   . Acute kidney injury ([place]) 12/20/2019  . Pressure ulcer 12/20/2019  . FTT (failure to thrive) in adult 11/11/2019  . Right leg weakness 10/28/2019  . Fall at home, initial encounter 10/28/2019  . Weakness 10/03/2019  . Lesion of skin of nose 04/23/2019  . Hematuria, gross 04/09/2018  . Hypokalemia 04/09/2018  . Malnutrition ([place]) 04/09/2018  . Constipation 12/31/2017  . S/P prostatectomy 12/30/2017  . Prostate cancer metastatic to bone ([place]) 12/30/2017  .  Hydronephrosis of left kidney 12/30/2017  . Small bowel obstruction ([place]) 09/16/2017  . Osteoarthritis 09/12/2017  . Hot flash due to medication 04/05/2017  . Squamous cell skin cancer 11/15/2016  . Elbow pain 10/21/2015  . 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 Insomnia 08/18/2014  . Sebaceous cyst 08/18/2014  . Cerumen impaction 07/23/2013  . Eczema 08/23/2012  . Pain in joint, shoulder region 08/23/2012  . Low back pain 05/21/2012  . Actinic keratoses 10/26/2011  . Contact dermatitis 09/22/2011  . Large hiatal hernia 07/13/2011  . Nonspecific (abnormal) findings on radiological and other examination of gastrointestinal tract 06/05/2011  . LUQ abdominal pain 04/22/2011  . Chest pain, atypical 04/22/2011  . Irreducible hiatal hernia 04/22/2011  . Neoplasm of uncertain behavior of skin 11/10/2010  . HIP PAIN 04/28/2010  . Diarrhea 10/07/2008  . ERECTILE DYSFUNCTION 08/22/2008  . Weight loss 08/22/2008  . PERSONAL HX COLON CANCER 11/06/2007  . Vitamin D deficiency 04/19/2007  . Anxiety state 04/19/2007  . HTN (hypertension) 04/19/2007  . GERD 04/19/2007  . Peptic ulcer 04/19/2007  . CERVICAL STRAIN 04/19/2007  . COLONIC POLYPS, HX OF 04/19/2007    Palliative Care Assessment & Plan   Patient Profile: 86 y.o. male  with past medical history of prostate cancer with mets to bone (not currently on treatment due to n/v- stopped in May), significant hiatal hernia with significant (53 pounds) weight loss in the last year, malnutrition with teeth falling out recently, HTN admitted on 12/20/2019 with acute kidney injury presumed to be related to dehydration and failure to thrive, bone pain r/t cancer metastasis. Plan for radiation sim and treatment on Monday. Palliative medicine consulted for goals of care.   Assessment/Recommendations/Plan   Continue current care  Patient's nephew to discuss comfort/hospice with patient-  [name] requesting to speak with Hospice  about Hospice services- TOC order placed- patient is currently Palliative patient with [facility]  Goals of Care and Additional Recommendations:  Limitations on Scope of Treatment: Full Scope Treatment  Code Status:  DNR  Prognosis:   Unable to determine  Discharge Planning:  To Be Determined  Care plan was discussed with patient's nephew- Jorge [name].  Thank you for allowing the Palliative Medicine Team to assist in the care of this patient.            Total time: 38 minutes T   Greater than 50%  of this time was spent counseling and coordinating care related to the above assessment and plan.  Mariana Kaufman, AGNP-C Palliative Medicine   Please contact Palliative Medicine Team phone at [phone] for questions and concerns.

## 2019-12-23 NOTE — Progress Notes (Signed)
Physical Therapy Treatment Patient Details Name: Jorge [name] MRN: [id number] DOB: July 08, 1929 Today's Date: 12/23/2019    History of Present Illness 86 yo M with H/O metasttic prostate cancer, HTN, GERD, recent weight loss, large HH with poor intake, recent multiple falls.IN [facility]  7/21 post fall. Patient called EMs due to feeling of being pushed and risk for fall.    PT Comments    Progressing slowly with mobility. Pt tolerated session well. Per chart, plan is possibly for home hospice. Will plan to follow during hospital stay.  Follow Up Recommendations  SNF;Supervision/Assistance - 24 hour (per chart, plan is possibly for hospice)     Equipment Recommendations  None recommended by PT    Recommendations for Other Services       Precautions / Restrictions Precautions Precautions: Fall Restrictions Weight Bearing Restrictions: No    Mobility  Bed Mobility Overal bed mobility: Needs Assistance Bed Mobility: Supine to Sit;Sit to Supine Rolling: Min assist Sidelying to sit: Min assist       General bed mobility comments: Assist for trunk and bil LEs. Increased time. Pt relied on bedrail.  Transfers Overall transfer level: Needs assistance Equipment used: Rolling walker (2 wheeled) Transfers: Sit to/from Stand Sit to Stand: Min assist;From elevated surface         General transfer comment: Assist to power up, stabilize, control descent. VCs safety, technique, hand placement.  Ambulation/Gait Ambulation/Gait assistance: Min assist Gait Distance (Feet): 2 Feet Assistive device: Rolling walker (2 wheeled)       General Gait Details: side steps along the side of the bed with RW. Assist to stabilize pt. Increased time. Pt fatigues fairly easily   Stairs             Wheelchair Mobility    Modified Rankin (Stroke Patients Only)       Balance Overall balance assessment: History of Falls;Needs assistance Sitting-balance support: Bilateral upper  extremity supported Sitting balance-Leahy Scale: Fair     Standing balance support: Bilateral upper extremity supported Standing balance-Leahy Scale: Poor                              Cognition Arousal/Alertness: Awake/alert Behavior During Therapy: WFL for tasks assessed/performed Overall Cognitive Status: Within Functional Limits for tasks assessed                                        Exercises Total Joint Exercises Ankle Circles/Pumps: AROM;10 reps;Both Quad Sets: AROM;10 reps;Both Heel Slides: AROM;Both;10 reps Straight Leg Raises: AAROM;5 reps;Supine;Both    General Comments        Pertinent Vitals/Pain Pain Assessment: Faces Faces Pain Scale: No hurt Pain Intervention(s): Monitored during session    Home Living                      Prior Function            PT Goals (current goals can now be found in the care plan section) Acute Rehab PT Goals PT Goal Formulation: With patient Time For Goal Achievement: 01/06/20 Potential to Achieve Goals: Fair Progress towards PT goals: Progressing toward goals    Frequency    Min 2X/week      PT Plan Current plan remains appropriate    Co-evaluation  AM-PAC PT "6 Clicks" Mobility   Outcome Measure  Help needed turning from your back to your side while in a flat bed without using bedrails?: A Little Help needed moving from lying on your back to sitting on the side of a flat bed without using bedrails?: A Little Help needed moving to and from a bed to a chair (including a wheelchair)?: A Little Help needed standing up from a chair using your arms (e.g., wheelchair or bedside chair)?: A Little Help needed to walk in hospital room?: A Lot Help needed climbing 3-5 steps with a railing? : Total 6 Click Score: 15    End of Session   Activity Tolerance: Patient limited by fatigue;Patient tolerated treatment well Patient left: in bed;with call bell/phone  within reach   PT Visit Diagnosis: Difficulty in walking, not elsewhere classified (R26.2);Unsteadiness on feet (R26.81);History of falling (Z91.81)     Time: 0177-9390 PT Time Calculation (min) (ACUTE ONLY): 17 min  Charges:  $Gait Training: 8-22 mins                        Doreatha Massed, PT Acute Rehabilitation  Office: [phone] Pager: [phone]

## 2019-12-23 NOTE — Progress Notes (Signed)
  Chaplain visited patient.  Both chaplain and patient are Presbyterians. They talked about patient's church experience at [facility], a church the chaplain once belonged to in the 1960s.  Patient was clear-headed and thoughtful. Widowed since 2003? He and his wife had no children. He states his nephew [name], his [facility], will be making his health decisions.  Chaplain began to speak of Advanced Directives and patient said he and [name] had already begun planning for it.  [name] had brought in paperwork that the patient  believed was ready to be notarized. IIt was a legal document that was lengthy. As chaplain reviewed it, it was just partially completed, only including Jeff's name.The patient did not directly address the use or non-use of life-sustaining meaures  He stated clearly in conversation that if he could not breathe with regularity he did not wish to be placed on a ventilator. Chaplain walked patient through the other parts of the form and asked patient to consider using hospital form and completing Part B with [name]. Chaplain also affirmed that without the form comleted [name] as NOK would already be his person for health care decisions. Chaplain left the hospital provided AD with her name and the spiritual care number.  If [name] comes, please contact the spiritual care dept for a meeting with him, if possible.  Rev. Tamsen Snider Pager [phone]

## 2019-12-23 NOTE — Care Management Important Message (Signed)
Important Message  Patient Details  Name: Jorge [name] MRN: [id number] Date of Birth: 27-Mar-1930   Medicare Important Message Given:  Yes (Medicare IM printed for [name], to give to the patient)     Crista Luria 12/23/2019, 10:56 AM

## 2019-12-23 NOTE — Progress Notes (Signed)
TOC CM spoke to nephew, [name]. He states pt and family have decided on [place] with [facility]. States he will need a day to arrange 24 hour caregiver for the home. Attending updated. Contacted [facility] rep, Anderson Malta and she will reach out to nephew to discuss Home Hospice. [place], [place] ED TOC CM [phone]

## 2019-12-23 NOTE — Progress Notes (Addendum)
[facility])  Referral received for outpatient palliative care services once pt discharges.  Current plan is to d/c to SNF to attempt rehab. Facility has not been fully decided on yet.   [facility] will reach out to family and assist with d/c palliative care services.  Thank you, Venia Carbon RN, BSN, [facility] Liaison   **Update, family has decided for home with hospice as opposed to SNF. Pt will likely not d/c until Wednesday to allow family time to find caregivers.  Pt has cane and walker, will need WC, [facility] will order.  Left message for Roselyn Meier to confirm.

## 2019-12-24 ENCOUNTER — Inpatient Hospital Stay (HOSPITAL_COMMUNITY): Payer: Medicare Other

## 2019-12-24 ENCOUNTER — Inpatient Hospital Stay: Payer: Medicare Other | Admitting: Nurse Practitioner

## 2019-12-24 LAB — BASIC METABOLIC PANEL
Anion gap: 12 (ref 5–15)
BUN: 35 mg/dL — ABNORMAL HIGH (ref 8–23)
CO2: 27 mmol/L (ref 22–32)
Calcium: 8.7 mg/dL — ABNORMAL LOW (ref 8.9–10.3)
Chloride: 110 mmol/L (ref 98–111)
Creatinine, Ser: 2.04 mg/dL — ABNORMAL HIGH (ref 0.61–1.24)
GFR calc Af Amer: 33 mL/min — ABNORMAL LOW (ref >60)
GFR calc non Af Amer: 28 mL/min — ABNORMAL LOW (ref >60)
Glucose, Bld: 120 mg/dL — ABNORMAL HIGH (ref 70–99)
Potassium: 4.3 mmol/L (ref 3.5–5.1)
Sodium: 149 mmol/L — ABNORMAL HIGH (ref 135–145)

## 2019-12-24 MED ORDER — BOOST PLUS PO LIQD
237.0000 mL | Freq: Three times a day (TID) | ORAL | Status: DC
  Filled 2019-12-24 (×5): qty 237

## 2019-12-24 MED ORDER — BOOST / RESOURCE BREEZE PO LIQD CUSTOM: 1.0000 | Freq: Two times a day (BID) | ORAL | Status: DC

## 2019-12-24 NOTE — Progress Notes (Signed)
Unable to administer patient's oral medications today as he had emesis during medication administration this AM. Patient requested that we order rich chocolate boost; however he dry heaved for approx 10 minutes after attempting to drink the boost.  He was able to have chocolate pudding, hot chocolate, and some liquids with lunch, and requested banana pudding and hot tea for dinner.

## 2019-12-24 NOTE — Progress Notes (Signed)
TOC CM spoke to pt's nephew, [name] and he working with [facility] to provided 24 hour care when pt is dc on Wednesday. States he will need until then to get everything into place. TOC CM has spoke to [place] to make aware pt and nephew are in agreement with going home with Home Hospice with [facility]. Explained [facility] will follow up with him today to arrange DME. They will schedule to see him on Wednesday at 2:30 pm to start care. Nephew will provide transportation home. Notified Bernadene Bell [employer] that pt will be going home with Hospice. States they will cancel SNF referral. Jonnie Finner RN [place], [place] ED TOC CM [phone]

## 2019-12-24 NOTE — Progress Notes (Addendum)
HEMATOLOGY-ONCOLOGY PROGRESS NOTE  SUBJECTIVE: Reports some mild discomfort in his left leg this morning.  He otherwise has no other complaints.  PHYSICAL EXAMINATION:  Vitals:   12/23/19 2154 12/24/19 0453  BP: 118/72 106/62  Pulse: 100 (!) 109  Resp: 19 20  Temp: 99.3 F (37.4 C) 100 F (37.8 C)  SpO2: 95% 95%   Filed Weights   12/20/19 1219 12/20/19 2200  Weight: 69.4 kg 63.3 kg    Intake/Output from previous day: 09/20 0701 - 09/21 0700 In: 2668.6 [P.O.:740; I.V.:1928.6] Out: 1600 [Urine:1600]  GENERAL:alert, no distress and comfortable Musculoskeletal: Strength symmetrical in the upper and lower extremities NEURO: alert & oriented x 3 with fluent speech, no focal motor/sensory deficits  LABORATORY DATA:  I have reviewed the data as listed CMP Latest Ref Rng & Units 12/24/2019 12/23/2019 12/22/2019  Glucose 70 - 99 mg/dL 120(H) 120(H) 122(H)  BUN 8 - 23 mg/dL 35(H) 36(H) 33(H)  Creatinine 0.61 - 1.24 mg/dL 2.04(H) 1.98(H) 1.91(H)  Sodium 135 - 145 mmol/L 149(H) 149(H) 145  Potassium 3.5 - 5.1 mmol/L 4.3 4.0 4.0  Chloride 98 - 111 mmol/L 110 114(H) 112(H)  CO2 22 - 32 mmol/L 27 25 24   Calcium 8.9 - 10.3 mg/dL 8.7(L) 8.5(L) 8.7(L)  Total Protein 6.5 - 8.1 g/dL - - -  Total Bilirubin 0.3 - 1.2 mg/dL - - -  Alkaline Phos 38 - 126 U/L - - -  AST 15 - 41 U/L - - -  ALT 0 - 44 U/L - - -    Lab Results  Component Value Date   WBC 10.0 12/22/2019   HGB 10.1 (L) 12/22/2019   HCT 33.4 (L) 12/22/2019   MCV 100.6 (H) 12/22/2019   PLT 423 (H) 12/22/2019   NEUTROABS 7.8 (H) 12/22/2019    CT Head Wo Contrast  Result Date: 12/20/2019 CLINICAL DATA:  Dizziness EXAM: CT HEAD WITHOUT CONTRAST TECHNIQUE: Contiguous axial images were obtained from the base of the skull through the vertex without intravenous contrast. COMPARISON:  None. FINDINGS: Brain: No evidence of acute territorial infarction, hemorrhage, hydrocephalus,extra-axial collection or mass lesion/mass effect.  Normal gray-white differentiation. Ventricles are normal in size and contour. Vascular: No hyperdense vessel or unexpected calcification. Skull: The skull is intact. No fracture or focal lesion identified. Sinuses/Orbits: The visualized paranasal sinuses and mastoid air cells are clear. The orbits and globes intact. Other: None IMPRESSION: No acute intracranial abnormality. Findings consistent with age related atrophy and chronic small vessel ischemia Electronically Signed   By: Prudencio Pair M.D.   On: 12/20/2019 17:12   US RENAL  Result Date: 12/21/2019 CLINICAL DATA:  Elevated creatinine. EXAM: RENAL / URINARY TRACT ULTRASOUND COMPLETE COMPARISON:  CT 07/29/2019 FINDINGS: Right Kidney: Renal measurements: 12.1 x 6.2 x 6.3 cm = volume: 250 mL. Increased parenchymal echogenicity. No focal mass or nephrolithiasis. Mild hydronephrosis. Left Kidney: Renal measurements: 10.3 x 6.6 x 3.9 cm = volume: 140 mL. Mild increased cortical echogenicity. No mass or nephrolithiasis. Moderate hydronephrosis. Bladder: Appears normal for degree of bladder distention. Other: Mild prominence of the ureters left greater than right. Left-sided stent seen over the ureter and bladder although intrarenal portion not well visualized. IMPRESSION: Normal size kidneys with findings suggesting medical renal disease. Mild right-sided and moderate left-sided hydronephrosis. Left-sided ureteral stent present. Electronically Signed   By: Marin Olp M.D.   On: 12/21/2019 13:37   DG Chest Port 1 View  Result Date: 12/20/2019 CLINICAL DATA:  Orthopnea EXAM: PORTABLE CHEST 1 VIEW COMPARISON:  July 29, 2019 FINDINGS: The heart size and mediastinal contours are mildly. Again noted is elevation of the left hemidiaphragm with a air-filled probable dilated. The right lung is clear. No large airspace consolidation or pleural effusion. No acute osseous abnormality. IMPRESSION: No active disease. Stable elevation of the left hemidiaphragm with a  mildly dilated air-filled stomach. Electronically Signed   By: Prudencio Pair M.D.   On: 12/20/2019 15:54    ASSESSMENT AND PLAN: 1.Prostate cancer, Gleason 7, status post a prostatectomy in 2008  Elevated PSA 2011, status post external beam radiation and androgen deprivation therapy, radiation completed March 2011, last injection July 2011  Elevated PSA October 2016, CT with periaortic and obturator lymphadenopathy  Androgen deprivation therapy resumed and continues, now on every 42-month Lupron  Bone scan 04/18/2017-new foci of metastatic disease at the right supra-acetabulum, left acetabulum, low cervical spine, and L2  CT 04/18/2017-left iliac, periaortic, and retrocrural lymphadenopathy  Bone scan 12/05/2017-progressive bone metastases; new left hydronephrosis and proximal hydroureter.  CT abdomen/pelvis 12/30/2017-moderate left hydronephrosis with moderate dilatation of the left renal pelvis and proximal left ureter without obstructing calculus. Stable large hiatal hernia. Stable sclerotic lesions in the pelvis and L3 vertebral body concerning for metastatic disease. No enlarged abdominal or pelvic lymph nodes.  Abiraterone/prednisone started 02/07/2018  PSA improved 02/26/2018, higher on 04/06/2018, improved 05/21/2018, higher 03/26/2019,higher 05/09/2019  CTs 05/14/2019-interval progression of sclerotic lesions in the bony pelvis and lumbar spine consistent with progression of bony metastatic disease. Decrease in the size of a medial right lower lobe pulmonary nodule. Decrease in size of left external iliac and retroperitoneal lymph nodes. Left internal ureteral stent with persistent mild left hydronephrosis. Large hiatal hernia or left hemidiaphragmatic defect, stable.  Zytiga discontinued 05/14/2019  Xtandi 06/02/2019, discontinued 06/10/2019 secondary to nausea/vomiting  CT abdomen/pelvis 07/29/2019-chronic left hydronephrosis, left ureter stent, large hiatal hernia, no adenopathy  sclerotic bone metastases  08/01/2019 PSA 257 (06/28/2019 PSA 72.6)  Xtandi resumed 08/13/2019  Xtandi on hold due to GI symptoms  2.Hypertension  3.Recurrent hiatal hernia-status post gastrostomy tube placement 09/20/2017  4.Left hydronephrosis status post placement of double-J stent 01/04/2018.  Jorge [name] appears stable.  He reports some mild discomfort in his left leg.  He is currently maintained off specific therapy for his prostate cancer.  Due to reported pain in his left leg, will perform imaging today to determine if there is any area to radiate.  The patient has a large hiatal hernia and is unable to eat.  He has been evaluated by general surgery in the past and was told that he was not a surgical candidate.  He has an incurable malignancy, but suspect that he could have a prolonged survival if he could maintain adequate nutrition.  Recommendations: 1. Continue current pain medications. 2.  We will obtain x-ray of the hip and left femur due to complaint of pain. 3.  Recommend surgical consult for J-tube placement if the patient wishes to pursue feeding tube.   LOS: 4 days   Mikey Bussing, DNP, AGPCNP-BC, AOCNP 12/24/19  Mr. Worthing was interviewed and examined.  He has a very difficult circumstance with metastatic incurable prostate cancer and a large hiatal hernia.  The hiatal hernia appears to be limiting his ability to maintain nutrition.  It is unclear whether he has symptoms related to prostate cancer.  Julieanne Manson, MD

## 2019-12-24 NOTE — Progress Notes (Signed)
PROGRESS NOTE    Jorge [name]  TOI:[id number]  DOB: 1929-10-09  PCP: Cassandria Anger, MD Admit date:12/20/2019 Chief compliant: Weakness, poor oral intake 86 y.o.malewith medical history significant ofmetastatic prostate cancer, GERD/hiatal hernia and failure to thrive with 53 pound weight loss over the past year due to inability to eat related to his hiatal hernia. Was recently admitted in July of this year after a fall at home with a similar presentation that included acute kidney injury. He was sent home with home health and PT. He had mostly finished this and had a nurse coming out to the home. He does have a lot of difficulty sleeping and eating and reports he has not been able to keep solid food down for the past 6 weeks. He has also lost 2 teeth in the past week. He reports drinking 1-3 boosts per day. When he got up this morning he tried to walk down the hall and was running into the walls. Eventually he called EMS for this reason. ED Course::In the ED he was noted to have acute kidney injury, he had a PT eval that has recommended SNF placement due to inability to safely maintain his balance and ambulate. He had a negative CT of his head Hospital course: Patient admitted to [facility] for further evaluation and management.  Patient's oral intake has remained poor here, mostly related to dysphagia in the setting of large hiatal hernia.  He has remained on IV fluids.  Seen by palliative care team for care goal discussions as patient not deemed to be a candidate for surgical repair of hiatal hernia by Dr. Hassell Done in the past.  Also seen by oncology, Dr. Learta Codding , who felt hip and sacral pain mostly related to decubiti then metastatic prostate cancer.  However recommended care goal discussions.  After discussions with palliative care team, patient now DNR but would like to consider feeding tube if it improves his quality of life and nutritional status.   Subjective:  Patient resting  comfortably.  Patient states he is convinced that he will eat better if at home and has access to his favorite foods.  Nephew in discussions with case management and home hospice being set up for tomorrow.  Patient and family now stepping away from PEG tube option. Objective: Vitals:   12/23/19 1234 12/23/19 2154 12/24/19 0453 12/24/19 1255  BP: 133/78 118/72 106/62 (!) 106/53  Pulse: (!) 102 100 (!) 109 (!) 105  Resp: 16 19 20 16   Temp: 99.4 F (37.4 C) 99.3 F (37.4 C) 100 F (37.8 C) 98.4 F (36.9 C)  TempSrc: Oral Oral Oral Oral  SpO2: 97% 95% 95% 95%  Weight:      Height:        Intake/Output Summary (Last 24 hours) at 12/24/2019 1342 Last data filed at 12/24/2019 1300 Gross per 24 hour  Intake 1500.61 ml  Output 1500 ml  Net 0.61 ml   Filed Weights   12/20/19 1219 12/20/19 2200  Weight: 69.4 kg 63.3 kg    Physical Examination:  General: Cachectic male, no acute distress noted Head ENT: Atraumatic normocephalic, PERRLA, neck supple Heart: S1-S2 heard, regular rate and rhythm, no murmurs.  No leg edema noted Lungs: Equal air entry bilaterally, no rhonchi or rales on exam, no accessory muscle use Abdomen: Bowel sounds heard, soft, nontender, nondistended. No organomegaly.  No CVA tenderness Extremities: No pedal edema.  No cyanosis or clubbing. Neurological: Awake alert oriented x3, no focal weakness or numbness, strength and  sensations to crude touch intact Skin: Sacral decubiti.         Data Reviewed: I have personally reviewed following labs and imaging studies  CBC: Recent Labs  Lab 12/20/19 1241 12/21/19 0551 12/22/19 0613  WBC 8.7 8.5 10.0  NEUTROABS 7.3  --  7.8*  HGB 10.8* 10.2* 10.1*  HCT 35.2* 34.3* 33.4*  MCV 98.1 101.8* 100.6*  PLT 504* 432* 073*   Basic Metabolic Panel: Recent Labs  Lab 12/20/19 1241 12/21/19 0551 12/22/19 0613 12/23/19 0449 12/24/19 0424  NA 146* 146* 145 149* 149*  K 4.8 3.9 4.0 4.0 4.3  CL 110 113* 112* 114*  110  CO2 24 24 24 25 27   GLUCOSE 113* 95 122* 120* 120*  BUN 40* 35* 33* 36* 35*  CREATININE 2.13* 2.01* 1.91* 1.98* 2.04*  CALCIUM 8.9 8.7* 8.7* 8.5* 8.7*  MG 2.4  --   --   --   --    GFR: Estimated Creatinine Clearance: 22.4 mL/min (A) (by C-G formula based on SCr of 2.04 mg/dL (H)). Liver Function Tests: Recent Labs  Lab 12/20/19 1241  AST 30  ALT 16  ALKPHOS 231*  BILITOT 0.5  PROT 7.0  ALBUMIN 2.7*   No results for input(s): LIPASE, AMYLASE in the last 168 hours. No results for input(s): AMMONIA in the last 168 hours. Coagulation Profile: No results for input(s): INR, PROTIME in the last 168 hours. Cardiac Enzymes: No results for input(s): CKTOTAL, CKMB, CKMBINDEX, TROPONINI in the last 168 hours. BNP (last 3 results) No results for input(s): PROBNP in the last 8760 hours. HbA1C: No results for input(s): HGBA1C in the last 72 hours. CBG: No results for input(s): GLUCAP in the last 168 hours. Lipid Profile: No results for input(s): CHOL, HDL, LDLCALC, TRIG, CHOLHDL, LDLDIRECT in the last 72 hours. Thyroid Function Tests: No results for input(s): TSH, T4TOTAL, FREET4, T3FREE, THYROIDAB in the last 72 hours. Anemia Panel: No results for input(s): VITAMINB12, FOLATE, FERRITIN, TIBC, IRON, RETICCTPCT in the last 72 hours. Sepsis Labs: No results for input(s): PROCALCITON, LATICACIDVEN in the last 168 hours.  Recent Results (from the past 240 hour(s))  SARS Coronavirus 2 by RT PCR (hospital order, performed in [facility] hospital lab) Nasopharyngeal Nasopharyngeal Swab     Status: None   Collection Time: 12/20/19  6:06 PM   Specimen: Nasopharyngeal Swab  Result Value Ref Range Status   SARS Coronavirus 2 NEGATIVE NEGATIVE Final    Comment: (NOTE) SARS-CoV-2 target nucleic acids are NOT DETECTED.  The SARS-CoV-2 RNA is generally detectable in upper and lower respiratory specimens during the acute phase of infection. The lowest concentration of SARS-CoV-2 viral  copies this assay can detect is 250 copies / mL. A negative result does not preclude SARS-CoV-2 infection and should not be used as the sole basis for treatment or other patient management decisions.  A negative result may occur with improper specimen collection / handling, submission of specimen other than nasopharyngeal swab, presence of viral mutation(s) within the areas targeted by this assay, and inadequate number of viral copies (<250 copies / mL). A negative result must be combined with clinical observations, patient history, and epidemiological information.  Fact Sheet for Patients:   StrictlyIdeas.no  Fact Sheet for Healthcare Providers: BankingDealers.co.za  This test is not yet approved or  cleared by the [place] FDA and has been authorized for detection and/or diagnosis of SARS-CoV-2 by FDA under an Emergency Use Authorization (EUA).  This EUA will remain in effect (meaning  this test can be used) for the duration of the COVID-19 declaration under Section 564(b)(1) of the Act, 21 U.S.C. section 360bbb-3(b)(1), unless the authorization is terminated or revoked sooner.  Performed at [facility], [place] [address] [place], [place] [address]       Radiology Studies: No results found.    Scheduled Meds: . (feeding supplement) PROSource Plus  30 mL Oral BID BM  . amLODipine  2.5 mg Oral Daily  . cholecalciferol  1,000 Units Oral Daily  . enoxaparin (LOVENOX) injection  30 mg Subcutaneous Q24H  . folic acid  1 mg Oral Daily  . lactose free nutrition  237 mL Oral TID WC  . mirtazapine  15 mg Oral QHS  . multivitamin with minerals  1 tablet Oral Daily  . pantoprazole  40 mg Oral QHS  . potassium chloride  10 mEq Oral Daily  . thiamine  100 mg Oral Daily  . vitamin B-12  1,000 mcg Oral Daily   Continuous Infusions: . dextrose 60 mL/hr at 12/24/19 1235      Assessment/Plan:  1.  AKI on CKD  stage IIIa: BUN 40 and creatinine 2.13 on presentation.  Slowly improving with hydration and creatinine around 1.9.  Patient did not have any residual on bladder scan.  Renal ultrasonogram showed persistent chronic left hydronephrosis and hydroureter with chronic stent in place.  2.  Hypernatremia, dehydration: As mentioned above, patient's oral intake had been poor.  Hypernatremia improved (146->145) after changing to hypotonic fluids (D5 LR) but up again at 149.  Today sodium still at 149, continue hypotonic fluids (changed to D5W yesterday)  3.  Dysphagia, poor oral intake: Probably related to large hiatal hernia then metastatic cancer per oncology.  Patient feels he is beginning to eat better.   Continue vitamin D supplements.  Patient states he does better when he  eats small portions although at times he goes  without eating anything for days due to nausea from large hiatal hernia.  I discussed again with patient and nephew [name]) today who has been in conversation with hospice team, they at this point do not want to go to the PEG tube route.  Nephew understands that if patient continues to do poorly with comfort feeds at home, he may worsen and might get closer to end-of-life due to hiatal hernia complications rather than malignancy.  Nephew apparently had a detailed discussion with hospice team yesterday as well regarding this and wishes to proceed with home hospice/comfort care upon discharge in a.m.  4.  Reactive thrombocytosis: Appears to be slowly improving 504->423.  Continue to monitor.  5.  Metastatic prostate cancer: History of suprapubic prostatectomy in 2008.Prior CT abdomen pelvis from April 2021 showed "significant areas of osseous sclerosis identified consistent with sclerotic osseous metastases involving a few ribs, thoracolumbar vertebra, pelvis, and proximal LEFT femur".  Given patient's complaint of low back pain/hip pain, radiation oncology consultation with Dr. Sondra Come was requested  but later canceled as metastatic prostate cancer felt to be stable per Dr. Learta Codding and bone pain likely related to problem #7.  Noted that hip/femur x-rays ordered by oncology.  6.  Failure to thrive, generalized weakness:-ct head no acute findings.  Patient has had unsafe ambulation at home.  BMI only at 18.4.  Initial plan was SNF placement but patient now wants to go home where he feels he will eat better.  Toula Moos, arranging for home with 24/7 supervision and discharge plan with home hospice in a.m.  as discussed above  7.  Sacral/hip pain: Likely secondary to sacral decubiti.  Continue local wound care.  DVT prophylaxis: Lovenox Code Status: DNR Family / Patient Communication: Discussed with patient and nephew, [name], over the phone.  Discussed with interdisciplinary team during rounds Disposition Plan:   Status is: Inpatient  Remains inpatient appropriate because:Unsafe d/c plan and IV treatments appropriate due to intensity of illness or inability to take PO   Dispo: The patient is from: Home              Anticipated d/c is to: Home with home hospice in a.m.              Anticipated d/c date is: 1 days              Patient currently is not medically stable to d/c.          Time spent: 25 minutes     >50% time spent in discussions with care team and coordination of care.    Guilford Shi, MD [facility] Pager in [place]  If 7PM-7AM, please contact night-coverage [email or web address] 12/24/2019, 1:42 PM

## 2019-12-24 NOTE — Progress Notes (Addendum)
NUTRITION NOTE RD working remotely.   Consult received for poor PO intake and Calorie Count. Full assessment completed by this RD on 9/18. Recommendation made at that time for consideration of J-tube if within [place] and medically feasible.   Patient is currently on a Soft diet with intakes ranging from 0-75% at meals. [employer] 1.4 po ordered TID and he has accepted this supplement only once since order placed on 9/18. Also ordered 30 ml Prosource Plus BID and he has accepted this supplement ~90% of the time offered since 9/18.  RD will follow-up on 9/22 with Calorie Count day #1 results and will complete NFPE at that time.   Message received from RN that patient is only able to tolerate juices and that he is interested in trying Boost Plus. Will d/c [employer] [place] and order Boost Plus TID, each supplement provides 360 kcal and 14 grams of protein. RN reports that despite liking juice, patient does not like Boost Breeze supplements.      Jorge Matin, MS, RD, LDN, CNSC Inpatient Clinical Dietitian RD pager # available in [place]  After hours/weekend pager # available in [facility]

## 2019-12-24 NOTE — Progress Notes (Addendum)
[facility]ist [facility]) Hospital Liaison: RN note   Notified by Transition of Care Manger of patient/family request for [facility] services at home after discharge. Chart and patient information under review by [facility] physician. Hospice eligibility approved.  Writer spoke with [name] to initiate education related to hospice philosophy, services and team approach to care.  [name] verbalized understanding of information given. Per discussion, plan is for discharge to home Wednesday morning by private vehicle if possible. [name] is also working with [facility] to get around the clock care in the home.  Please send signed and completed DNR form home with patient/family. Patient will need prescriptions for discharge comfort medications.     DME needs have been discussed, patient currently has the following equipment in the home: walker, over the toilet seat .  Patient/family requests the following DME for delivery to the home: Wheelchair and [facility]. [place] equipment manager has been notified and will contact DME provider to arrange delivery to the home. Home address has been verified and is correct in the chart.  [name] is the family member to contact to arrange time of delivery.     [facility]erral [facility] aware of the above. Please notify [facility] when patient is ready to leave the unit at discharge. (Call [phone] or [phone] after 5pm.) [facility] information and contact numbers given to [facility].      If at Discharge the RN can please call [name] as he will be picking the patient up and lives 44min away.   Please call with any hospice related questions.     Thank you for this referral.     Clementeen Hoof, BSN, [facility] (listed on AMION under Hospice and [place] [facility] [place])  [phone]

## 2019-12-25 LAB — BASIC METABOLIC PANEL
Anion gap: 7 (ref 5–15)
BUN: 42 mg/dL — ABNORMAL HIGH (ref 8–23)
CO2: 26 mmol/L (ref 22–32)
Calcium: 8.1 mg/dL — ABNORMAL LOW (ref 8.9–10.3)
Chloride: 106 mmol/L (ref 98–111)
Creatinine, Ser: 2.26 mg/dL — ABNORMAL HIGH (ref 0.61–1.24)
GFR calc Af Amer: 29 mL/min — ABNORMAL LOW (ref >60)
GFR calc non Af Amer: 25 mL/min — ABNORMAL LOW (ref >60)
Glucose, Bld: 116 mg/dL — ABNORMAL HIGH (ref 70–99)
Potassium: 4.3 mmol/L (ref 3.5–5.1)
Sodium: 139 mmol/L (ref 135–145)

## 2019-12-25 MED ORDER — PANTOPRAZOLE SODIUM 40 MG PO TBEC: 40.0000 mg | DELAYED_RELEASE_TABLET | Freq: Every day | ORAL | 0 refills | Status: AC

## 2019-12-25 MED ORDER — ONDANSETRON 4 MG PO TBDP: 4.0000 mg | ORAL_TABLET | Freq: Three times a day (TID) | ORAL | 0 refills | Status: AC | PRN

## 2019-12-25 MED ORDER — OXYCODONE HCL 5 MG/5ML PO SOLN
5.0000 mg | ORAL | 0 refills | Status: AC | PRN
Start: 2019-12-25 — End: ?

## 2019-12-25 NOTE — Progress Notes (Signed)
[facility] Documentation  Pt has an admission visit scheduled at 2:30pm with [place].   Liaison called son to ensure DME delivery and left VM.   Please do not hesitate to outreach with any questions.   Thank you,  Freddie Breech, RN  [facility] Liaison [phone]

## 2019-12-25 NOTE — Progress Notes (Signed)
Nutrition Brief Note  Chart reviewed. Pt now transitioning to comfort care.  No further nutrition interventions warranted at this time.  Will d/c calorie count.  Clayton Bibles, MS, RD, LDN Inpatient Clinical Dietitian Contact information available via Amion

## 2019-12-25 NOTE — Progress Notes (Signed)
AVS reviewed with pt and family member. Opportunity given for questions. Pt transported by wheelchair by Nurse Technician to family member's car.

## 2019-12-25 NOTE — TOC Transition Note (Signed)
Transition of Care [facility]) - CM/SW Discharge Note   Patient Details  Name: Jorge [name] MRN: [id number] Date of Birth: [birth year]  Transition of Care [facility]) CM/SW Contact:  Ross Ludwig, LCSW Phone Number: 12/25/2019, 3:34 PM   Clinical Narrative:     Patient will be going home with home hospice through [place].  CSW signing off please reconsult with any other social work needs, home hospice agency has been notified of planned discharge.  Final next level of care: Home w Hospice Care Barriers to Discharge: Barriers Resolved   Patient Goals and CMS Choice Patient states their goals for this hospitalization and ongoing recovery are:: To go home with hospice services and 24 hour care givers. CMS Medicare.gov Compare Post Acute Care list provided to:: Patient Represenative (must comment) Choice offered to / list presented to : [place] / [place]  Discharge Placement                       Discharge Plan and Services In-house Referral: Clinical Social Work Discharge Planning Services: CM Consult Post Acute Care Choice: [place]          DME Arranged: Bedside commode DME Agency: Other - Comment Lonia Chimera) Date DME Agency Contacted: 12/25/19 Time DME Agency Contacted: [place]: Other - See comment ([facility] hospice services.) Date [place]: 12/25/19 Time [place]: 0762    Social Determinants of Health (SDOH) Interventions     Readmission Risk Interventions Readmission Risk Prevention Plan 10/29/2019  Transportation Screening Complete  PCP or Specialist Appt within 5-7 Days Complete  Home Care Screening Complete  Medication Review (RN CM) Complete  Some recent data might be hidden

## 2019-12-25 NOTE — Plan of Care (Signed)
  Problem: Education: Goal: Knowledge of General Education information will improve Description: Including pain rating scale, medication(s)/side effects and non-pharmacologic comfort measures Outcome: Completed/Met   Problem: Health Behavior/Discharge Planning: Goal: Ability to manage health-related needs will improve Outcome: Completed/Met   Problem: Clinical Measurements: Goal: Ability to maintain clinical measurements within normal limits will improve Outcome: Completed/Met Goal: Diagnostic test results will improve Outcome: Completed/Met   Problem: Activity: Goal: Risk for activity intolerance will decrease Outcome: Completed/Met   Problem: Nutrition: Goal: Adequate nutrition will be maintained Outcome: Completed/Met   Problem: Elimination: Goal: Will not experience complications related to bowel motility Outcome: Completed/Met   Problem: Pain Managment: Goal: General experience of comfort will improve Outcome: Completed/Met   Problem: Safety: Goal: Ability to remain free from injury will improve Outcome: Completed/Met   Problem: Skin Integrity: Goal: Risk for impaired skin integrity will decrease Outcome: Completed/Met

## 2019-12-25 NOTE — Discharge Summary (Signed)
Physician Discharge Summary  Jorge [name]:[id number] DOB: [birth year] DOA: 12/20/2019  PCP: Cassandria Anger, MD  Admit date: 12/20/2019 Discharge date: 12/25/2019  Time spent: 25 minutes  Recommendations for Outpatient Follow-up:  1. Patient to discharge with home hospice for end-of-life care at home with bedside commode in addition to other relevant DME 2. Medications minimized and delineated and Rx of opiates given on discharge  Discharge Diagnoses:  Principal Problem:   Acute kidney injury ([place]) Active Problems:   HTN (hypertension)   GERD   Weight loss   Irreducible hiatal hernia   Prostate cancer metastatic to bone (HCC)   Malnutrition (HCC)   Weakness   FTT (failure to thrive) in adult   Pressure ulcer   Goals of care, counseling/discussion   Palliative care by specialist   Advanced care planning/counseling discussion   Discharge Condition: Guarded  Diet recommendation: Comfort  Filed Weights   12/20/19 1219 12/20/19 2200  Weight: 69.4 kg 63.3 kg    History of present illness:  73 white male history of metastatic prostate cancer reflux hiatal hernia failure to thrive secondary to poor in the facility to eat secondary to his hiatal hernia Previously had been seen by Dr. Johnathan Hausen and not deemed to be a candidate for surgical repair of the hiatal hernia by him in the past Previous admission this year 10/2019 was similar complaints fall at home AKI and went home with home health and PT He has been quite weak over the past several weeks came to the hospital without any ability to eat and lost 2 teeth in the past week He has been able to eat cream corn, pork and beans and drinks several boosts a day at home but he was quite weak on admission  Hospital Course:  AKI on CKD 3 BUN/creatinine 40/2.1 on admission and improved to about 1.9-he has chronic left hydronephrosis hydroureter with chronic stent in place with no need for intervention His creatinine got  better with dextrose however has climbed back up over the past several days and it is not thought that the patient will improve in the long-term  Hyponatremia secondary to volume depletion Was given some D5 No further aggressive management  Dysphagia secondary to large hiatal hernia and metastatic cancer Is able to eat small portion although sometimes is unable to eat PEG tube was entertained but family deferred the same with the same-family discussed with hospice team and they wish to go home with hospice care in the outpatient setting  Metastatic prostate cancer status post suprapubic prostatectomy 2008-prior CT 07/23/2019 osseous involvement Initially XRT was entertained but canceled as most of these issues were felt to be-x-ray of on 9/21 showed no new findings  Adult failure to thrive in the setting of malignancy and hiatal hernia BMI is 18-we will order DME-he wants to go home where he thinks he will eat better  Sacral decubitus stage I present on admission Stable at this time  Procedures:  Multiple t  Consultations:  Multiple  Discharge Exam: Vitals:   12/25/19 0906 12/25/19 0908  BP: 119/72 119/72  Pulse:  98  Resp:    Temp:    SpO2:  92%    Awake pleasant without distress tolerated night without vomiting was able to keep down some liquids understands  General: awake alert coherent no distress EOMI NCAT no focal deficit Cardiovascular: I9-S8 holosystolic murmur displaced Respiratory: Clinically clear no added sound no rales no rhonchi Abdomen soft nontender no rebound no guarding Bitemporal  wasting supraclavicular wasting Abdomen soft no rebound no guarding  Discharge Instructions   Discharge Instructions    DME Bedside commode   Complete by: As directed    Patient needs a bedside commode to treat with the following condition: Cancer ([place])   Diet - low sodium heart healthy   Complete by: As directed    Discharge instructions   Complete by: As directed     Patient to discharge with home hospice and they will assume care once discharged-scripts given   Increase activity slowly   Complete by: As directed    No wound care   Complete by: As directed      Allergies as of 12/25/2019      Reactions   Aspirin Other (See Comments)   Ulcerated stomach   Azithromycin Shortness Of Breath   Cefuroxime Axetil Shortness Of Breath   Iodinated Diagnostic Agents Anaphylaxis   Iodine Other (See Comments)   Per pt tremors and dyspnea   Other Other (See Comments)   Twist in esophagus so patient can not take large pills/capsules   Naproxen Other (See Comments)    upset stomach - like with other NSAIDs   Penicillins Rash   Can take the shot, but not the pill 18 months ago      Medication List    STOP taking these medications   acetaminophen 500 MG tablet Commonly known as: TYLENOL   amLODipine 2.5 MG tablet Commonly known as: NORVASC   losartan 25 MG tablet Commonly known as: COZAAR   LUPRON DEPOT-PED (54-MONTH) IM   mirtazapine 15 MG tablet Commonly known as: Remeron   polyvinyl alcohol 1.4 % ophthalmic solution Commonly known as: LIQUIFILM TEARS   potassium chloride 10 MEQ tablet Commonly known as: KLOR-CON     TAKE these medications   famotidine 40 MG tablet Commonly known as: Pepcid Take 1 tablet (40 mg total) by mouth daily.   hydrOXYzine 25 MG tablet Commonly known as: ATARAX/VISTARIL Take 1-2 tablets (25-50 mg total) by mouth at bedtime as needed (insomnia).   loperamide 2 MG tablet Commonly known as: Imodium A-D Take 1-2 tablets (2-4 mg total) by mouth 3 (three) times daily as needed for diarrhea or loose stools.   ondansetron 4 MG disintegrating tablet Commonly known as: Zofran ODT Take 1 tablet (4 mg total) by mouth every 8 (eight) hours as needed for nausea or vomiting.   oxyCODONE 5 MG/5ML solution Commonly known as: ROXICODONE Take 5 mLs (5 mg total) by mouth every 4 (four) hours as needed for moderate pain.    pantoprazole 40 MG tablet Commonly known as: PROTONIX Take 1 tablet (40 mg total) by mouth at bedtime.   polyethylene glycol powder 17 GM/SCOOP powder Commonly known as: GLYCOLAX/MIRALAX Take 17 g by mouth 2 (two) times daily as needed for mild constipation or moderate constipation.            Durable Medical Equipment  (From admission, onward)         Start     Ordered   12/25/19 0947  DME [place] Rile  Once       Question Answer Comment  Walker: With 5 Inch Wheels   Patient needs a walker to treat with the following condition Cancer ([place])      12/25/19 0952   12/25/19 0000  DME Bedside commode       Question:  Patient needs a bedside commode to treat with the following condition  Answer:  Cancer ([place])   12/25/19  2836         Allergies  Allergen Reactions  . Aspirin Other (See Comments)    Ulcerated stomach  . Azithromycin Shortness Of Breath  . Cefuroxime Axetil Shortness Of Breath  . Iodinated Diagnostic Agents Anaphylaxis  . Iodine Other (See Comments)    Per pt tremors and dyspnea  . Other Other (See Comments)    Twist in esophagus so patient can not take large pills/capsules  . Naproxen Other (See Comments)     upset stomach - like with other NSAIDs  . Penicillins Rash    Can take the shot, but not the pill 18 months ago     [facility] Follow up.   Specialty: Hospice and Palliative Medicine Why: [place] will contact you to arrange initial visit with Home Hospice RN Contact information: [place] [id number] [phone]                The results of significant diagnostics from this hospitalization (including imaging, microbiology, ancillary and laboratory) are listed below for reference.    Significant Diagnostic Studies: CT Head Wo Contrast  Result Date: 12/20/2019 CLINICAL DATA:  Dizziness EXAM: CT HEAD WITHOUT CONTRAST TECHNIQUE: Contiguous axial images  were obtained from the base of the skull through the vertex without intravenous contrast. COMPARISON:  None. FINDINGS: Brain: No evidence of acute territorial infarction, hemorrhage, hydrocephalus,extra-axial collection or mass lesion/mass effect. Normal gray-white differentiation. Ventricles are normal in size and contour. Vascular: No hyperdense vessel or unexpected calcification. Skull: The skull is intact. No fracture or focal lesion identified. Sinuses/Orbits: The visualized paranasal sinuses and mastoid air cells are clear. The orbits and globes intact. Other: None IMPRESSION: No acute intracranial abnormality. Findings consistent with age related atrophy and chronic small vessel ischemia Electronically Signed   By: Prudencio Pair M.D.   On: 12/20/2019 17:12   US RENAL  Result Date: 12/21/2019 CLINICAL DATA:  Elevated creatinine. EXAM: RENAL / URINARY TRACT ULTRASOUND COMPLETE COMPARISON:  CT 07/29/2019 FINDINGS: Right Kidney: Renal measurements: 12.1 x 6.2 x 6.3 cm = volume: 250 mL. Increased parenchymal echogenicity. No focal mass or nephrolithiasis. Mild hydronephrosis. Left Kidney: Renal measurements: 10.3 x 6.6 x 3.9 cm = volume: 140 mL. Mild increased cortical echogenicity. No mass or nephrolithiasis. Moderate hydronephrosis. Bladder: Appears normal for degree of bladder distention. Other: Mild prominence of the ureters left greater than right. Left-sided stent seen over the ureter and bladder although intrarenal portion not well visualized. IMPRESSION: Normal size kidneys with findings suggesting medical renal disease. Mild right-sided and moderate left-sided hydronephrosis. Left-sided ureteral stent present. Electronically Signed   By: Marin Olp M.D.   On: 12/21/2019 13:37   DG Chest Port 1 View  Result Date: 12/20/2019 CLINICAL DATA:  Orthopnea EXAM: PORTABLE CHEST 1 VIEW COMPARISON:  July 29, 2019 FINDINGS: The heart size and mediastinal contours are mildly. Again noted is elevation of the  left hemidiaphragm with a air-filled probable dilated. The right lung is clear. No large airspace consolidation or pleural effusion. No acute osseous abnormality. IMPRESSION: No active disease. Stable elevation of the left hemidiaphragm with a mildly dilated air-filled stomach. Electronically Signed   By: Prudencio Pair M.D.   On: 12/20/2019 15:54   DG HIPS BILAT WITH PELVIS 2V  Result Date: 12/24/2019 CLINICAL DATA:  Multiple falls recently. History of metastatic prostate cancer. EXAM: DG HIP (WITH OR WITHOUT PELVIS) 2V BILAT COMPARISON:  10/28/2019 FINDINGS: Partially visualized double-J  left-sided internal ureteral stent unchanged. There is extensive patchy sclerotic disease throughout the pelvic bones common visualized lumbosacral spine and left proximal femur without significant change compatible with known metastatic prostate cancer. No evidence of acute fracture or dislocation. The bony structures not involved by sclerotic metastatic disease are under mineralized making fracture difficult to detect. IMPRESSION: 1. No acute fracture or dislocation. 2. Extensive patchy sclerotic metastatic disease involving the pelvis, lumbosacral spine and left proximal femur without significant change. Electronically Signed   By: Marin Olp M.D.   On: 12/24/2019 15:23   DG FEMUR MIN 2 VIEWS LEFT  Result Date: 12/24/2019 CLINICAL DATA:  Multiple recent falls. History metastatic prostate cancer. EXAM: LEFT FEMUR 2 VIEWS COMPARISON:  Hip 10/28/2019 FINDINGS: Known patchy sclerotic disease over the visualized left pelvic bones and proximal left femur compatible with known metastatic disease. No acute fracture or dislocation. IMPRESSION: 1. No acute fracture or dislocation. 2. Known osseous metastatic disease. Electronically Signed   By: Marin Olp M.D.   On: 12/24/2019 15:25    Microbiology: Recent Results (from the past 240 hour(s))  SARS Coronavirus 2 by RT PCR (hospital order, performed in [facility] hospital  lab) Nasopharyngeal Nasopharyngeal Swab     Status: None   Collection Time: 12/20/19  6:06 PM   Specimen: Nasopharyngeal Swab  Result Value Ref Range Status   SARS Coronavirus 2 NEGATIVE NEGATIVE Final    Comment: (NOTE) SARS-CoV-2 target nucleic acids are NOT DETECTED.  The SARS-CoV-2 RNA is generally detectable in upper and lower respiratory specimens during the acute phase of infection. The lowest concentration of SARS-CoV-2 viral copies this assay can detect is 250 copies / mL. A negative result does not preclude SARS-CoV-2 infection and should not be used as the sole basis for treatment or other patient management decisions.  A negative result may occur with improper specimen collection / handling, submission of specimen other than nasopharyngeal swab, presence of viral mutation(s) within the areas targeted by this assay, and inadequate number of viral copies (<250 copies / mL). A negative result must be combined with clinical observations, patient history, and epidemiological information.  Fact Sheet for Patients:   StrictlyIdeas.no  Fact Sheet for Healthcare Providers: BankingDealers.co.za  This test is not yet approved or  cleared by the [place] FDA and has been authorized for detection and/or diagnosis of SARS-CoV-2 by FDA under an Emergency Use Authorization (EUA).  This EUA will remain in effect (meaning this test can be used) for the duration of the COVID-19 declaration under Section 564(b)(1) of the Act, 21 U.S.C. section 360bbb-3(b)(1), unless the authorization is terminated or revoked sooner.  Performed at [facility], [place] [address] [place], [place] [address]      Labs: Basic Metabolic Panel: Recent Labs  Lab 12/20/19 1241 12/20/19 1241 12/21/19 0551 12/22/19 0613 12/23/19 0449 12/24/19 0424 12/25/19 0510  NA 146*   < > 146* 145 149* 149* 139  K 4.8   < > 3.9 4.0 4.0 4.3 4.3   CL 110   < > 113* 112* 114* 110 106  CO2 24   < > 24 24 25 27 26   GLUCOSE 113*   < > 95 122* 120* 120* 116*  BUN 40*   < > 35* 33* 36* 35* 42*  CREATININE 2.13*   < > 2.01* 1.91* 1.98* 2.04* 2.26*  CALCIUM 8.9   < > 8.7* 8.7* 8.5* 8.7* 8.1*  MG 2.4  --   --   --   --   --   --    < > =  values in this interval not displayed.   Liver Function Tests: Recent Labs  Lab 12/20/19 1241  AST 30  ALT 16  ALKPHOS 231*  BILITOT 0.5  PROT 7.0  ALBUMIN 2.7*   No results for input(s): LIPASE, AMYLASE in the last 168 hours. No results for input(s): AMMONIA in the last 168 hours. CBC: Recent Labs  Lab 12/20/19 1241 12/21/19 0551 12/22/19 0613  WBC 8.7 8.5 10.0  NEUTROABS 7.3  --  7.8*  HGB 10.8* 10.2* 10.1*  HCT 35.2* 34.3* 33.4*  MCV 98.1 101.8* 100.6*  PLT 504* 432* 423*   Cardiac Enzymes: No results for input(s): CKTOTAL, CKMB, CKMBINDEX, TROPONINI in the last 168 hours. BNP: BNP (last 3 results) Recent Labs    12/20/19 1805  BNP 188.4*    ProBNP (last 3 results) No results for input(s): PROBNP in the last 8760 hours.  CBG: No results for input(s): GLUCAP in the last 168 hours.     Signed:  Nita Sells MD   [facility] 12/25/2019, 9:52 AM

## 2019-12-27 ENCOUNTER — Telehealth: Payer: Self-pay | Admitting: Nurse Practitioner

## 2019-12-27 NOTE — Telephone Encounter (Signed)
Called [name] - left message with appt date and time per 9/24 sch message

## 2019-12-31 ENCOUNTER — Telehealth: Payer: Self-pay | Admitting: *Deleted

## 2019-12-31 NOTE — Telephone Encounter (Signed)
Left VM w/son asking if he is w/Hospice in the home or in a facility. Would he want a telephone visit or video visit with Dr. Benay Spice? Who is his attending w/Hospice and what diagnosis did they provide to qualify for hospice care?

## 2020-01-03 NOTE — Telephone Encounter (Signed)
2nd voice mail left for son requesting update and if patient wants to keep his office visit in October.

## 2020-01-08 ENCOUNTER — Telehealth: Payer: Self-pay | Admitting: Internal Medicine

## 2020-01-09 NOTE — Telephone Encounter (Signed)
[place] [facility] [employer] funeral home dropped off death certificate. Placed in MD's box to be filled out and signed by MD. Call [phone] when ready for pick up. Thanks.

## 2020-01-15 ENCOUNTER — Ambulatory Visit: Payer: Medicare Other | Admitting: Nurse Practitioner

## 2020-02-03 NOTE — Telephone Encounter (Signed)
Jorge [name] from [employer] called to inform us that Jorge [name] is deceased

## 2020-02-03 DEATH — deceased

## 2020-10-27 IMAGING — CT CT HEAD W/O CM
2 of 4 series · 12 of 47 positions shown, 15 images · non-contrast
Comparison: None.

CLINICAL DATA: Recent fall with headaches facial pain, initial
encounter

EXAM:
CT HEAD WITHOUT CONTRAST
TECHNIQUE: Contiguous axial images were obtained from the base of the skull
through the vertex without intravenous contrast.

[Series 5: coronal soft tissue · coronal · 0.32mm/px · 3 of 77 slices shown]
[im 26/77  brain]
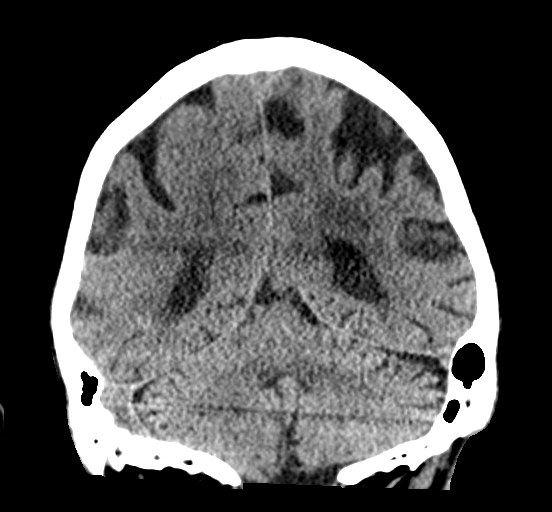
[im 34/77  brain]
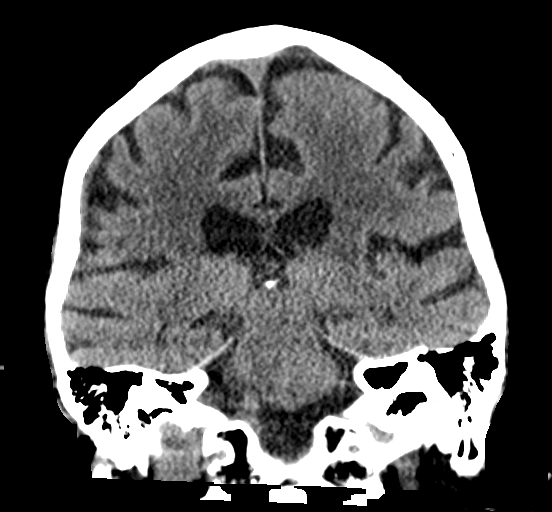
[im 43/77  brain]
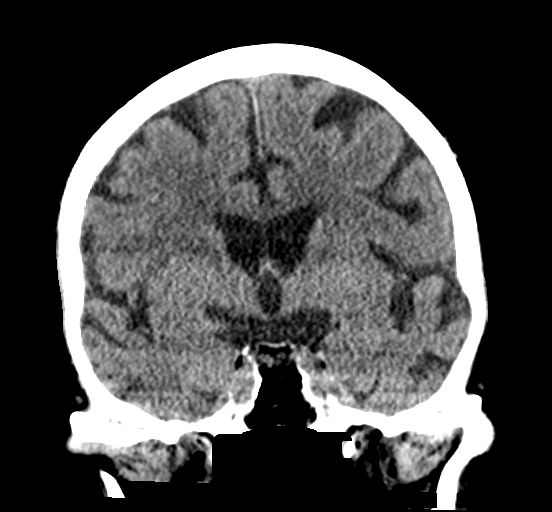

[Series 7: true axial · axial · 0.32mm/px · z∈[-169,-25]mm · 9 of 56 slices shown, 12 images]
[im 4/56  brain]
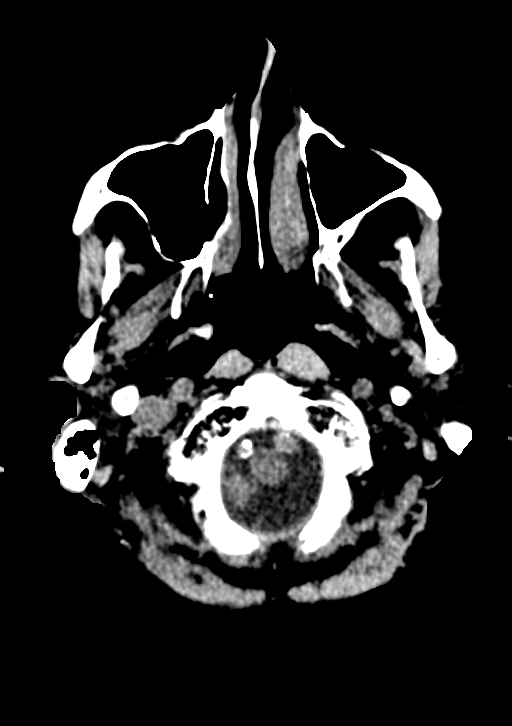
[im 4/56  bone]
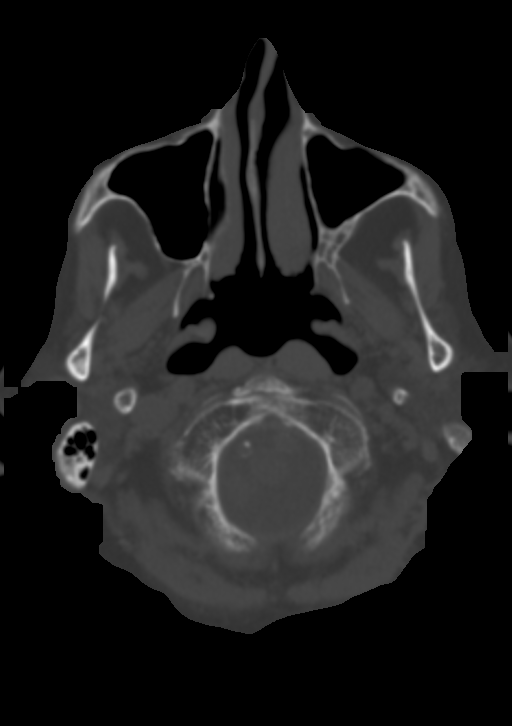
[im 10/56  brain]
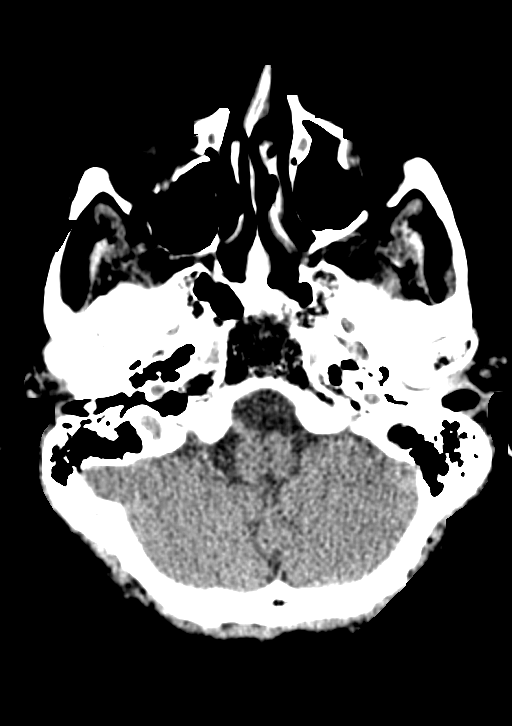
[im 16/56  brain]
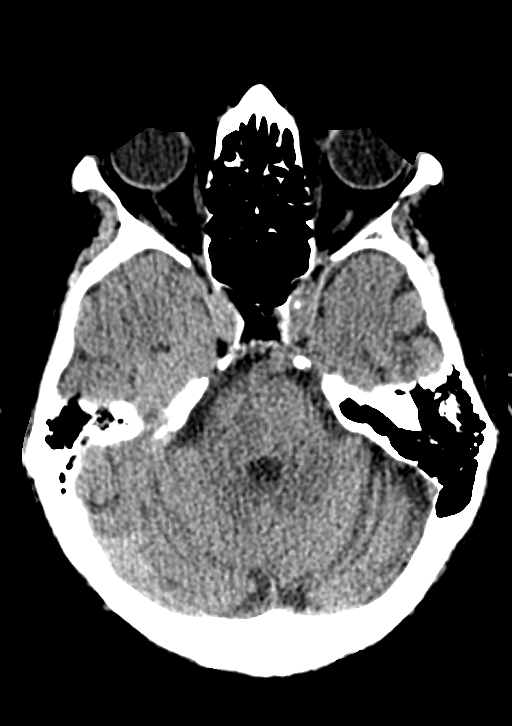
[im 22/56  brain]
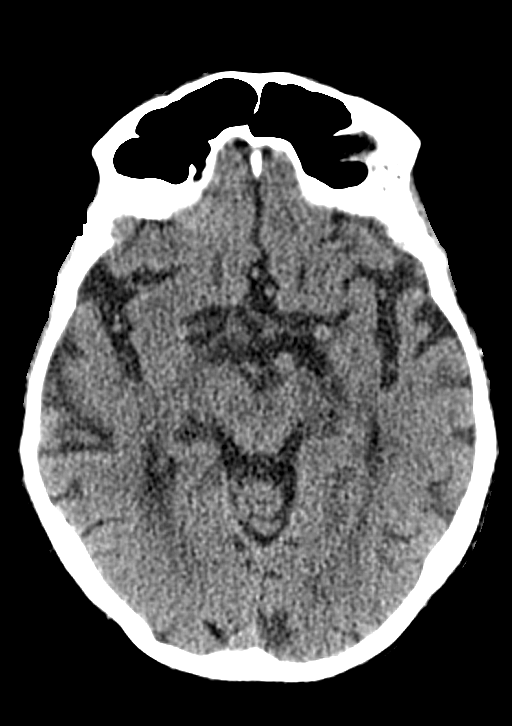
[im 28/56  brain]
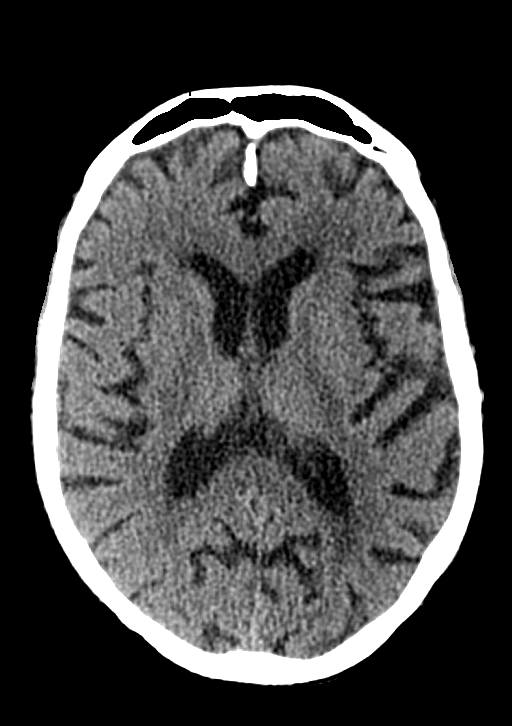
[im 28/56  bone]
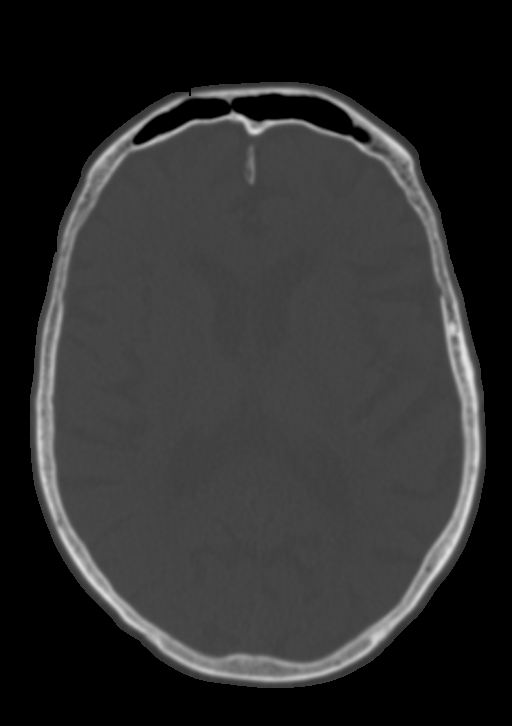
[im 34/56  brain]
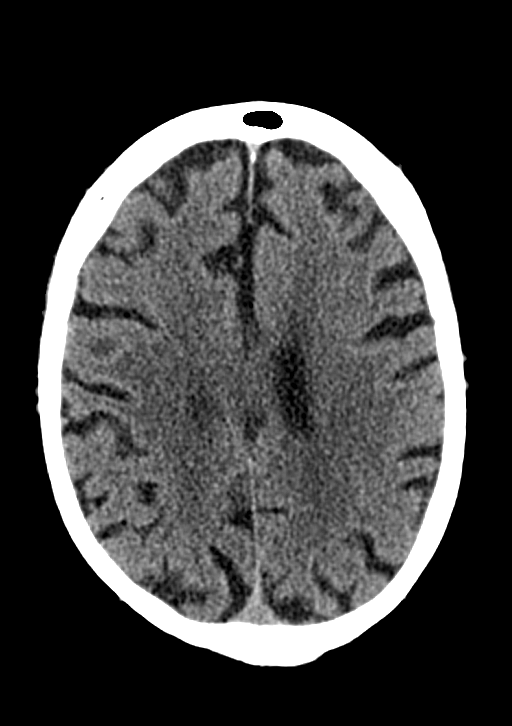
[im 40/56  brain]
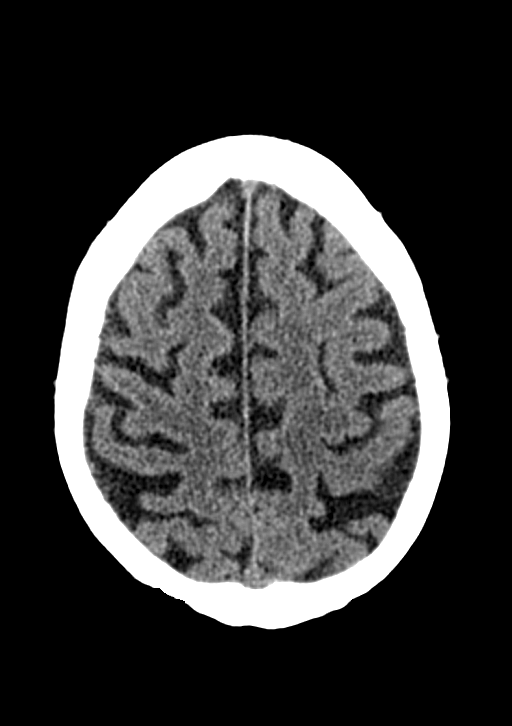
[im 46/56  brain]
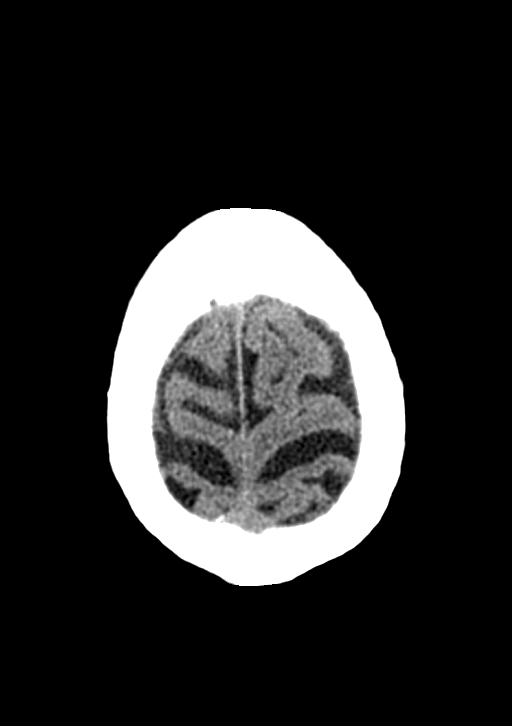
[im 52/56  brain]
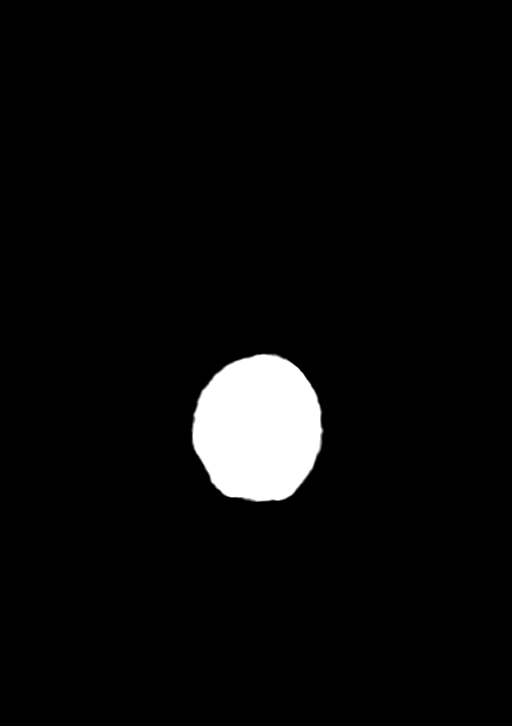
[im 52/56  bone]
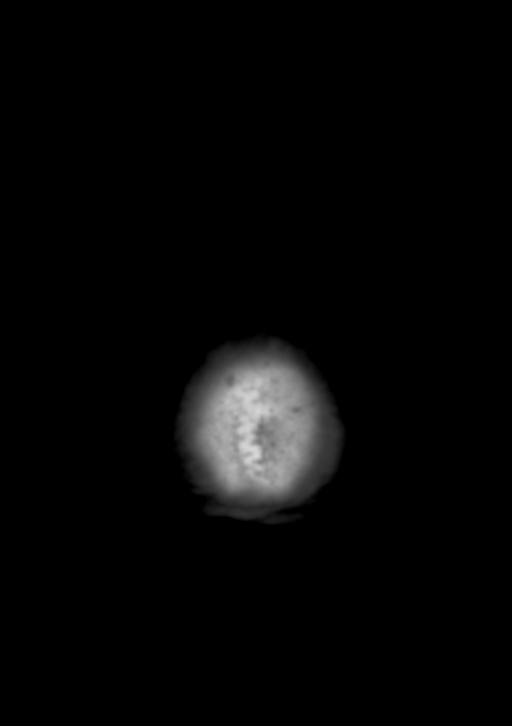

[12 of 47 positions shown; findings below may reference images not displayed]

FINDINGS: Brain: Chronic atrophic and ischemic changes are noted. No findings
to suggest acute hemorrhage, acute infarction or space-occupying
mass lesion are noted.

Vascular: No hyperdense vessel or unexpected calcification.

Skull: Normal. Negative for fracture or focal lesion.

Sinuses/Orbits: No acute finding.

Other: None.
IMPRESSION: Chronic atrophic and ischemic changes without acute abnormality.
